# Patient Record
Sex: Male | Born: 1965 | Race: Black or African American | Hispanic: No | Marital: Single | Smoking: Current every day smoker
Health system: Southern US, Community
[De-identification: ages and names within clinical notes are randomized; demographics above are authoritative.]

## PROBLEM LIST (undated history)

## (undated) DIAGNOSIS — F32A Depression, unspecified: Secondary | ICD-10-CM

## (undated) DIAGNOSIS — I509 Heart failure, unspecified: Secondary | ICD-10-CM

## (undated) DIAGNOSIS — L8990 Pressure ulcer of unspecified site, unspecified stage: Secondary | ICD-10-CM

## (undated) DIAGNOSIS — Z87442 Personal history of urinary calculi: Secondary | ICD-10-CM

## (undated) DIAGNOSIS — F1010 Alcohol abuse, uncomplicated: Secondary | ICD-10-CM

## (undated) DIAGNOSIS — K219 Gastro-esophageal reflux disease without esophagitis: Secondary | ICD-10-CM

## (undated) DIAGNOSIS — Z9359 Other cystostomy status: Secondary | ICD-10-CM

## (undated) DIAGNOSIS — S14109A Unspecified injury at unspecified level of cervical spinal cord, initial encounter: Secondary | ICD-10-CM

## (undated) DIAGNOSIS — S82899A Other fracture of unspecified lower leg, initial encounter for closed fracture: Secondary | ICD-10-CM

## (undated) DIAGNOSIS — F329 Major depressive disorder, single episode, unspecified: Secondary | ICD-10-CM

## (undated) DIAGNOSIS — A4159 Other Gram-negative sepsis: Secondary | ICD-10-CM

## (undated) DIAGNOSIS — G904 Autonomic dysreflexia: Secondary | ICD-10-CM

## (undated) DIAGNOSIS — F438 Other reactions to severe stress: Secondary | ICD-10-CM

## (undated) DIAGNOSIS — S72009A Fracture of unspecified part of neck of unspecified femur, initial encounter for closed fracture: Secondary | ICD-10-CM

## (undated) DIAGNOSIS — S82891A Other fracture of right lower leg, initial encounter for closed fracture: Secondary | ICD-10-CM

## (undated) DIAGNOSIS — J9600 Acute respiratory failure, unspecified whether with hypoxia or hypercapnia: Secondary | ICD-10-CM

## (undated) DIAGNOSIS — Z720 Tobacco use: Secondary | ICD-10-CM

## (undated) DIAGNOSIS — M1990 Unspecified osteoarthritis, unspecified site: Secondary | ICD-10-CM

## (undated) DIAGNOSIS — R410 Disorientation, unspecified: Secondary | ICD-10-CM

## (undated) DIAGNOSIS — G9341 Metabolic encephalopathy: Secondary | ICD-10-CM

## (undated) DIAGNOSIS — I5030 Unspecified diastolic (congestive) heart failure: Secondary | ICD-10-CM

## (undated) DIAGNOSIS — G8250 Quadriplegia, unspecified: Secondary | ICD-10-CM

## (undated) DIAGNOSIS — R569 Unspecified convulsions: Secondary | ICD-10-CM

## (undated) HISTORY — PX: KNEE SURGERY: SHX244

---

## 1999-03-31 ENCOUNTER — Emergency Department (HOSPITAL_COMMUNITY): Admission: EM | Admit: 1999-03-31 | Discharge: 1999-03-31 | Payer: Self-pay | Admitting: Emergency Medicine

## 2007-04-07 ENCOUNTER — Emergency Department (HOSPITAL_COMMUNITY): Admission: EM | Admit: 2007-04-07 | Discharge: 2007-04-07 | Payer: Self-pay | Admitting: Emergency Medicine

## 2008-04-15 ENCOUNTER — Inpatient Hospital Stay (HOSPITAL_COMMUNITY): Admission: EM | Admit: 2008-04-15 | Discharge: 2008-04-22 | Payer: Self-pay | Admitting: Emergency Medicine

## 2008-04-15 ENCOUNTER — Ambulatory Visit: Payer: Self-pay | Admitting: Pulmonary Disease

## 2008-06-04 ENCOUNTER — Emergency Department (HOSPITAL_COMMUNITY): Admission: EM | Admit: 2008-06-04 | Discharge: 2008-06-05 | Payer: Self-pay | Admitting: Emergency Medicine

## 2008-09-10 ENCOUNTER — Ambulatory Visit: Payer: Self-pay | Admitting: Internal Medicine

## 2008-09-27 ENCOUNTER — Ambulatory Visit (HOSPITAL_COMMUNITY): Admission: RE | Admit: 2008-09-27 | Discharge: 2008-09-27 | Payer: Self-pay | Admitting: Family Medicine

## 2008-10-04 ENCOUNTER — Ambulatory Visit: Payer: Self-pay | Admitting: *Deleted

## 2009-07-15 ENCOUNTER — Emergency Department (HOSPITAL_COMMUNITY): Admission: EM | Admit: 2009-07-15 | Discharge: 2009-07-16 | Payer: Self-pay | Admitting: Emergency Medicine

## 2009-07-19 ENCOUNTER — Emergency Department (HOSPITAL_COMMUNITY): Admission: EM | Admit: 2009-07-19 | Discharge: 2009-07-19 | Payer: Self-pay | Admitting: Emergency Medicine

## 2010-04-16 ENCOUNTER — Emergency Department (HOSPITAL_COMMUNITY): Admission: EM | Admit: 2010-04-16 | Discharge: 2010-04-16 | Payer: Self-pay | Admitting: Emergency Medicine

## 2011-01-01 ENCOUNTER — Encounter: Payer: Self-pay | Admitting: Orthopedic Surgery

## 2011-04-24 NOTE — Discharge Summary (Signed)
NAME:  Phillip [name], Phillip [name] NO.:  [id number]   MEDICAL RECORD NO.:  [id number]          PATIENT TYPE:  INP   LOCATION:  1309                         FACILITY:  [facility]   PHYSICIAN:  Herbie Saxon, MDDATE OF BIRTH:  20-Mar-1966   DATE OF ADMISSION:  04/15/2008  DATE OF DISCHARGE:                               DISCHARGE SUMMARY   DISCHARGE DIAGNOSES:  1. Acute severe pancreatitis.  2. Acute hepatitis.  3. Alcohol abuse.  4. Tobacco abuse.  5. Hypertriglyceridemia.  6. Bilateral pleural effusion left greater than right.  7. Left lower lobe pneumonia.  8. Hypokalemia, on supplementation.  9. Microcytic anemia.  10.Thrombocytopenia.  11.Hypomagnesemia  12.Hypophosphatemia, was repleted  13.Sinus tachycardia.   CONSULTING PHYSICIAN:  Dr. Laurell Josephs   RADIOLOGY:  Chest x-ray 04/18/08 shows multiple right, left effusion,  bibasilar opacities noted slightly on the left.  CT of abdomen 04/15/08 shows diffuse inflammatory changes of the  pancreas compatible with acute pancreatitis.  No focal acidosis.  Diffuse fatty infiltration of the liver.  Small left pleural effusion,  minimal bibasilar atelectasis, atherosclerosis, a great amount of free  fluid tracking in the pelvis.  Chest x-ray 04/17/08 shows worsening effusion of atelectasis greater on  the left.   HOSPITAL COURSE:  Phillip [name] is a 45 year old African American male  given IV  and transferred to continuous IV on 04/18/08  With the  complaint of intermittent abdominal pain of one month duration  associated with nausea, vomiting, diarrhea  who presented to the  emergency room with hypotension, started on IV Unasyn, IV fluid  hydration and analgesic. Triglyceride was over 5000.  The patient was  kept NPO on conservative measures including sliding scale coverage,  hypoglycemia.  Hepatitis is resolving.  Hypokalemia which has been  repleted.  He has been started for Lopid for hypertriglyceridemia.  The  hypomagnesemia and hypophosphatemia have been repleted.  The patient was  started on Rocephin and Ditropan 04/19/08.  Noted to be having diarrhea,  which is probably the lactulose he was placed on because of his elevated  ammonia level.  The lactulose since has been reduced and lactulose  discontinued today.  C. diff tox is negative and diarrhea resolved. Marland Kitchen4  units of packed red blood cells transfused today with IV Lasix.  Patient  has alcohol withdrawal.   PHYSICAL EXAMINATION:  Ill man not in acute distress.  Temperature 98,  pulse 109, respiratory rate 22, blood pressure 143/94.  GENERAL:  Pale, not jaundiced.  No cyanosis or clubbing.  HEENT:  Normocephalic, atraumatic.  Mucous membranes moist.  Oropharynx  and nasopharynx are clear.  NECK:  Supple, negative JVD, no thyromegaly, lymphadenopathy.  CARDIOVASCULAR:  S1, S2, tachycardic, regular rate and rhythm, no  murmurs.  CHEST:  Clear, has reduced breath sounds at the bases, especially on the  left.  ABDOMEN:  Benign.  NEUROLOGIC:  Oriented to time, place and person.  Power is 5. Peripheral  pulses present.  Cranial nerves II-XII intact. Deep tendon reflexes 2  globally.  Babinski downgoing bilaterally.   Glucose 120, potassium 3.3, ammonia 44, lipase 51.  WBCs 8, hematocrit  23, platelet count 69, MCV 103, AST 58, ALT 55.   Followup is posttransfusion PBC,  repeat lipase, ammonia and  transaminases in the morning.   Medications will be determined at time of discharge.      Herbie Saxon, MD  Electronically Signed     MIO/MEDQ  D:  04/20/2008  T:  04/20/2008  Job:  [id number]

## 2011-04-24 NOTE — H&P (Signed)
NAME:  DORAN, NESTLE NO.:  [id number]   MEDICAL RECORD NO.:  [id number]          PATIENT TYPE:  INP   LOCATION:  1232                         FACILITY:  [facility]   PHYSICIAN:  Oley Balm. Sung Amabile, MD   DATE OF BIRTH:  1966/08/06   DATE OF ADMISSION:  04/15/2008  DATE OF DISCHARGE:                              HISTORY & PHYSICAL   ADMISSION DIAGNOSIS:  Severe pancreatitis.   HISTORY OF PRESENT ILLNESS:  Phillip [name] is a 45 year old gentleman who  presented to the [facility] Emergency Department on the day of  admission with 1 month of intermittent abdominal pain associated with  nausea, vomiting, and diarrhea.  For the 12 hours prior to his  presentation, that abdominal pain became severe on and prompted him to  go to the emergency department for evaluation.  Upon arrival, he was  noted to be hypotensive, which subsequently resolved with intravenous  fluids.  Because of the hypertension, critical care medicine service was  called to admit him.  A CT scan of the abdomen was performed for  evaluation and demonstrated changes of severe pancreatitis.  Upon my  initial evaluation, his blood pressure was normal, and he appeared to be  in no acute distress.  He is alert and awake.  He is well oriented.  He  describes drinking 4-5 drinks of vodka per day.  He has never had a  history of alcohol withdrawal complications.   PAST MEDICAL HISTORY:  Otherwise unremarkable.  He has had left knee  surgery related to a football injury approximately 20 years ago.   SOCIAL HISTORY:  He smokes approximately 1/2 pack to 1 pack of  cigarettes per day.  Drinks heavily, as described above.  He works as a  Investment banker, operational.  He has no history of abdominal exposures of note, and no recent  travel history of note.   FAMILY HISTORY:  Notable for diabetes only.   REVIEW OF SYSTEMS:  As per the history present illness.  In addition, he  denies fevers, chills, and sweats.  He has had no chest pain.  He  denies  dysuria.  Denies lower extremity edema and calf tenderness.   PHYSICAL EXAMINATION:  VITAL SIGNS:  Afebrile, with normal vital signs,  except heart rate of approximately 120.  GENERAL:  He is in no apparent distress.  HEENT: Reveals no acute abnormalities.  NECK:  Supple, without adenopathy or jugular venous distention.  CHEST:  Reveals a normal percussion note throughout.  Breath sounds are  full, without wheezes or other adventitious sounds.  CARDIAC:  Reveals a regular rate and rhythm, with no murmurs.  ABDOMEN:  Soft, nontender, with normal bowel sounds, but diffusely  tender - mildly to moderately.  EXTREMITIES: Without clubbing, cyanosis and edema.  NEUROLOGIC:  Reveals no focal deficits.   DATA:  Admission laboratory data has been reviewed.  Of note, the  initial specimen was described as lipemic.  His chemistries are notable  only for a calcium of 5.6.  Protein level is low at 4.9, and albumin  2.3.  Liver function tests are elevated, with an SGOT of  772, and an  SGPT of 225.  Total bilirubin 3.1.  Amylase 247.  Lipase could not be  obtained due to the lipemic nature of the specimen.  Total triglycerides  elevated at 5076.  CT scan of the abdomen demonstrates changes of severe  pancreatitis.  No chest x-ray has been performed.   IMPRESSION:  Severe pancreatitis - appears to be a combination of  alcoholic and hypertriglyceridemic. Despite the appearance on CT scan  and despite the fact that he presented with mild hypotension, he looks  remarkably well and does not appear to require central venous access.  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  His calcium level was markedly low as a result of  the pancreatitis.   PLAN/RECOMMENDATIONS:  He will be admitted to the step-down unit for  overnight observation.  Empiric antibiotics (Unasyn) have been  initiated.  He will remain n.p.o. and supported with intravenous fluids.   We will need to consider treatment for severe hypertriglyceridemia, as  this was certainly a major contributor, I suspect.  Sliding-scale  insulin has been ordered.  DVT prophylaxis has been ordered with  Lovenox.  Stress ulcer prophylaxis has been ordered, Protonix.  Analgesia will be provided by patient-controlled pump using Dilaudid.      Oley Balm Sung Amabile, MD  Electronically Signed     DBS/MEDQ  D:  04/16/2008  T:  04/16/2008  Job:  [id number]

## 2011-04-24 NOTE — Discharge Summary (Signed)
NAME:  Phillip [name], Phillip [name] NO.:  [id number]   MEDICAL RECORD NO.:  [id number]          PATIENT TYPE:  INP   LOCATION:  1309                         [facility]ACILITY:  [facility]   PHYSICIAN:  Michelene Gardener, MD    DATE OF BIRTH:  06-30-1966   DATE OF ADMISSION:  04/15/2008  DATE OF DISCHARGE:  04/22/2008                               DISCHARGE SUMMARY   ADDENDUM   DISCHARGE DIAGNOSES:  1. Acute severe pancreatitis.  2. Acute hepatitis.  3. Alcohol use.  4. Tobacco abuse.  5. Hypertriglyceridemia.  6. Bilateral pleural effusion, left greater than right.  7. Left lower lobe pneumonia.  8. Hypokalemia that resolved.  9. Microcytic anemia.  10.Thrombocytopenia.  11.Hypomagnesemia.  12.Hypoxemia.  13.Sinus tachycardia.   DISCHARGE MEDICATIONS:  1. Avelox 400 mg p.o. once daily times 5 days.  2. Flagyl 500 mg p.o. q.8h. times 5 days.  3. Vicodin 2 tablets q.6h as needed.  4. Lopid 600 mg p.o. twice daily.   COURSE OF HOSPITALIZATION:  Since the dictated discharge summary the  patient remained stable with no major change in his condition.  The  patient was already started on diet and then his diet was advanced and  he tolerated it very well.  At the time of discharge he was able to take  a regular diet with minimal pain.  Pain medications were discontinued  and at the time of discharge he had not taken any pain medicine for at  least 10 hours.  Lipase was back to normal.  Ammonia was back to normal.  Last x-ray done on him was on May 10, showed small to moderate left  effusion with bilateral opacities that was improved since last x-ray.  The patient remained afebrile.  Physical exam is benign.  He will be  sent home today on the above-mentioned medications.  Social service was  consulted to help with financial issues.  He was advised to follow with  health services.      Michelene Gardener, MD  Electronically Signed    NAE/MEDQ  D:  04/22/2008  T:  04/22/2008  Job:   [phone]

## 2011-09-06 LAB — CBC
MCHC: 33.4
MCV: 93.9
RBC: 4.03 — ABNORMAL LOW
RDW: 13.8
WBC: 8.4

## 2011-09-06 LAB — COMPREHENSIVE METABOLIC PANEL
ALT: 16
Alkaline Phosphatase: 55
BUN: 4 — ABNORMAL LOW
Chloride: 106
Creatinine, Ser: 0.82
GFR calc Af Amer: >60
GFR calc non Af Amer: >60
Total Bilirubin: 0.9

## 2011-09-06 LAB — URINALYSIS, ROUTINE W REFLEX MICROSCOPIC
Bilirubin Urine: NEGATIVE
Ketones, ur: NEGATIVE
Nitrite: NEGATIVE
Specific Gravity, Urine: 1.012
Urobilinogen, UA: 0.2

## 2011-09-06 LAB — DIFFERENTIAL
Basophils Absolute: 0.1
Lymphs Abs: 2.9
Monocytes Absolute: 0.6
Neutro Abs: 4.7

## 2011-09-06 LAB — LIPASE, BLOOD: Lipase: <10 — ABNORMAL LOW

## 2012-05-24 ENCOUNTER — Encounter (HOSPITAL_COMMUNITY): Payer: Self-pay | Admitting: Emergency Medicine

## 2012-05-24 ENCOUNTER — Emergency Department (INDEPENDENT_AMBULATORY_CARE_PROVIDER_SITE_OTHER): Payer: 59

## 2012-05-24 ENCOUNTER — Emergency Department (HOSPITAL_COMMUNITY)
Admission: EM | Admit: 2012-05-24 | Discharge: 2012-05-24 | Disposition: A | Discharge status: Home / Self Care | Payer: 59 | Source: Home / Self Care | Attending: Family Medicine | Admitting: Family Medicine

## 2012-05-24 DIAGNOSIS — IMO0002 Reserved for concepts with insufficient information to code with codable children: Secondary | ICD-10-CM

## 2012-05-24 DIAGNOSIS — M1710 Unilateral primary osteoarthritis, unspecified knee: Secondary | ICD-10-CM

## 2012-05-24 DIAGNOSIS — S83207A Unspecified tear of unspecified meniscus, current injury, left knee, initial encounter: Secondary | ICD-10-CM

## 2012-05-24 DIAGNOSIS — M1712 Unilateral primary osteoarthritis, left knee: Secondary | ICD-10-CM

## 2012-05-24 MED ORDER — DICLOFENAC POTASSIUM 50 MG PO TABS: 50.0000 mg | ORAL_TABLET | Freq: Three times a day (TID) | ORAL | Status: AC

## 2012-05-24 NOTE — Discharge Instructions (Signed)
Wear knee brace and use medicine as needed, see orthopedist if further problems.

## 2012-05-24 NOTE — ED Notes (Signed)
Tim, emt applying knee immobilizer

## 2012-05-24 NOTE — ED Notes (Signed)
C/o bilateral knee pain.  History of knee issues.  Patient reports over the past month knees have been getting worse: frequent swelling, frequent pain episodes.  No known injury one month ago, but does report walking dog yesterday and dog lunged, causing patient to twist left knee.  Reports pain in the back of both knees.

## 2012-05-24 NOTE — ED Provider Notes (Signed)
History     CSN: [id number]  Arrival date & time 05/24/12  1112   First MD Initiated Contact with Patient 05/24/12 1118      Chief Complaint  Patient presents with  . Knee Pain    (Consider location/radiation/quality/duration/timing/severity/associated sxs/prior treatment) Patient is a 46 y.o. male presenting with knee pain. The history is provided by the patient.  Knee Pain This is a recurrent problem. The current episode started more than 1 week ago (bilat knee pains for more than 1 mo.). The problem has been rapidly worsening (worse yest when twisted.). The symptoms are aggravated by twisting.    History reviewed. No pertinent past medical history.  Past Surgical History  Procedure Date  . Knee surgery     right knee surgery 1988    Family History  Problem Relation Age of Onset  . Hypertension Mother     History  Substance Use Topics  . Smoking status: Current Everyday Smoker  . Smokeless tobacco: Not on file  . Alcohol Use: Yes      Review of Systems  Constitutional: Negative.   Musculoskeletal: Positive for joint swelling and gait problem.    Allergies  Review of patient's allergies indicates no known allergies.  Home Medications   Current Outpatient Rx  Name Route Sig Dispense Refill  . IBUPROFEN 400 MG PO TABS Oral Take 400 mg by mouth every 6 (six) hours as needed.    Marland Kitchen DICLOFENAC POTASSIUM 50 MG PO TABS Oral Take 1 tablet (50 mg total) by mouth 3 (three) times daily. For knee pain 30 tablet 0    BP 159/101  Pulse 97  Temp 98.8 F (37.1 C) (Oral)  Resp 20  SpO2 100%  Physical Exam  Nursing note and vitals reviewed. Constitutional: He is oriented to person, place, and time. He appears well-developed and well-nourished.  Musculoskeletal: He exhibits tenderness.       Right knee: He exhibits bony tenderness. He exhibits no swelling, no effusion and no ecchymosis. tenderness found. Medial joint line and lateral joint line tenderness noted.     Left knee: He exhibits decreased range of motion, swelling, effusion and bony tenderness. He exhibits no ecchymosis and no erythema.  Neurological: He is alert and oriented to person, place, and time.  Skin: Skin is warm and dry.    ED Course  Procedures (including critical care time)  Labs Reviewed - No data to display Dg Knee Complete 4 Views Left  05/24/2012  *RADIOLOGY REPORT*  Clinical Data: And knee pain.  LEFT KNEE - COMPLETE 4+ VIEW  Comparison: No priors.  Findings: Four views of the left knee demonstrate no acute fracture, dislocation, joint or soft tissue abnormality.  There is however advanced osteoarthritis with profound joint space narrowing, subchondral sclerosis, subchondral cyst formation and osteophyte formation that is most severe in the medial compartment, and to a lesser extent in the patellofemoral compartment.  Mild subluxation of the distal femur relative to the tibia and is presumably secondary to advanced degenerative changes. Extensive atherosclerosis throughout the visualized vasculature.  IMPRESSION: 1.  Negative for acute fracture. 2.  Advanced degenerative changes of osteoarthritis, as detailed above, most severe in the medial compartment. 3.  Atherosclerosis.  Original Report Authenticated By: Florencia Reasons, M.D.     1. Degenerative joint disease of left knee   2. Tear of meniscus of left knee       MDM  X-rays reviewed and report per radiologist.  Linna Hoff, MD 05/24/12 1240

## 2012-05-24 NOTE — ED Notes (Signed)
Instructed to undress and place on gown for physician examination 

## 2012-09-15 ENCOUNTER — Ambulatory Visit (INDEPENDENT_AMBULATORY_CARE_PROVIDER_SITE_OTHER): Payer: 59 | Admitting: Sports Medicine

## 2012-09-15 VITALS — BP 130/86 | Ht 69.0 in | Wt 175.0 lb

## 2012-09-15 DIAGNOSIS — M25561 Pain in right knee: Secondary | ICD-10-CM

## 2012-09-15 DIAGNOSIS — M25569 Pain in unspecified knee: Secondary | ICD-10-CM

## 2012-09-15 DIAGNOSIS — M170 Bilateral primary osteoarthritis of knee: Secondary | ICD-10-CM

## 2012-09-15 DIAGNOSIS — M1710 Unilateral primary osteoarthritis, unspecified knee: Secondary | ICD-10-CM

## 2012-09-15 DIAGNOSIS — IMO0002 Reserved for concepts with insufficient information to code with codable children: Secondary | ICD-10-CM

## 2012-09-15 MED ORDER — MELOXICAM 15 MG PO TABS: 15.0000 mg | ORAL_TABLET | Freq: Every day | ORAL | Status: DC

## 2012-09-15 MED ORDER — TRAMADOL HCL 50 MG PO TABS: 50.0000 mg | ORAL_TABLET | Freq: Three times a day (TID) | ORAL | Status: DC | PRN

## 2012-09-15 NOTE — Progress Notes (Signed)
  Subjective:    Patient ID: Phillip [name], male    DOB: Jun 01, 1966, 46 y.o.   MRN: [id number]  HPI chief complaint: Bilateral knee pain  Pleasant 46 year old male comes in today complaining of long-standing bilateral knee pain. He injured his left knee playing football as a teenager. Had surgery to repair a "torn ligament". Denies any previous surgery to the right knee. He complains of diffuse bilateral knee pain worse with activity such as standing. Pain with squatting as well. Painful catching and popping as well as swelling. He feels like the knees want to give way. He was recently seen in the emergency room and x-rays of his left knee were obtained. He was placed on Voltaren but it has not been helpful. He has also tried Tylenol which is also not been helpful. He gets pain at rest particularly at night. No fevers or chills. His medical history is unremarkable but it should be noted that he has no primary care physician No known drug allergies Surgical history is as above Socially he smokes a pack of cigarettes a day, he drinks one alcoholic beverage a day, he works at the [employer] in [employer]    Review of Systems As above    Objective:   Physical Exam Well-developed, well-nourished. No acute distress. He appears older than his stated age  Right knee: Varus deformity is noted.  Range of motion from 0-100. 1+ intra-articular effusion. 1+ patellofemoral crepitus. The knee is stable to ligamentous exam. No joint line tenderness. Negative McMurray's. Neurovascularly intact distally  Left knee: Varus deformity is noted. Range of motion 0-100. 1+ intra-articular effusion. 1+ patellofemoral crepitus. Knee is stable to ligamentous exam including a negative Lachman and a negative drawer. No joint line tenderness. Negative McMurray's. Neurovascular intact distally  He walks with an obvious limp  X-rays of his left knee dated 05/24/2012 from [facility] are reviewed. They  are non-standing films. He has advanced osteoarthritis in the medial joint space with mild subluxation of the distal femur. Nothing acute. He has extensive atherosclerotic changes throughout the visualized vasculature.       Assessment & Plan:  1. Bilateral knee pain secondary to advanced medial compartmental DJD  I injected each of his knees with cortisone today. This was done using an anterior lateral approach. Prescription for Mobic 15 mg daily and Ultram 50 mg 3 times a day when necessary were provided. We will fit him with bilateral body helix knee sleeves and he will followup with me in 6 weeks. Definitive treatment is going to be a total knee arthroplasty but he is awfully young to consider that right now. If cortisone injections are ineffective, I would start with getting standing x-rays of each of his knees and potentially starting Visco supplementation.  Consent obtained and verified. Time-out conducted. Noted no overlying erythema, induration, or other signs of local infection. Skin prepped in a sterile fashion. Topical analgesic spray: Ethyl chloride. Joint: Left Knee Needle: 1.5 inch 25g Completed without difficulty. Meds: 3cc 0.5% marcaine, 1cc depomedrol  Consent obtained and verified. Time-out conducted. Noted no overlying erythema, induration, or other signs of local infection. Skin prepped in a sterile fashion. Topical analgesic spray: Ethyl chloride. Joint: Right Knee Needle: 1.5 inch 25g Completed without difficulty. Meds: 3 cc 0.5% marcaine, 1 cc depomedrol  Advised to call if fevers/chills, erythema, induration, drainage, or persistent bleeding.

## 2012-10-27 ENCOUNTER — Ambulatory Visit: Payer: 59 | Admitting: Sports Medicine

## 2012-11-03 ENCOUNTER — Ambulatory Visit: Payer: 59 | Admitting: Sports Medicine

## 2012-11-24 ENCOUNTER — Ambulatory Visit (INDEPENDENT_AMBULATORY_CARE_PROVIDER_SITE_OTHER): Payer: 59 | Admitting: Sports Medicine

## 2012-11-24 ENCOUNTER — Ambulatory Visit
Admission: RE | Admit: 2012-11-24 | Discharge: 2012-11-24 | Disposition: A | Discharge status: Home / Self Care | Payer: 59 | Source: Ambulatory Visit | Attending: Sports Medicine | Admitting: Sports Medicine

## 2012-11-24 ENCOUNTER — Encounter: Payer: Self-pay | Admitting: Sports Medicine

## 2012-11-24 VITALS — BP 108/73 | HR 103 | Ht 69.0 in | Wt 175.0 lb

## 2012-11-24 DIAGNOSIS — M1711 Unilateral primary osteoarthritis, right knee: Secondary | ICD-10-CM

## 2012-11-24 DIAGNOSIS — M25569 Pain in unspecified knee: Secondary | ICD-10-CM

## 2012-11-24 DIAGNOSIS — M1710 Unilateral primary osteoarthritis, unspecified knee: Secondary | ICD-10-CM

## 2012-11-24 DIAGNOSIS — M1712 Unilateral primary osteoarthritis, left knee: Secondary | ICD-10-CM

## 2012-11-24 NOTE — Progress Notes (Signed)
  Subjective:    Patient ID: Phillip [name], male    DOB: [birth year], 46 y.o.   MRN: [id number]  HPI  Pt is a 46 yo M presenting for follow up for bilateral knee pain. Patient was evaluated in October 2013 for bilateral pain and was given steroid injections at that time. He states the injections offered about one month of pain relief. He was also taking Mobic and Tramadol as needed, but has not taken these recently. Patient states he is not using compression sleeves (although this was in last office visit note, patient states he was not given these.) He states both knees continue to bother him. His left knee pain is anteromedial with swelling. Right knee pain is both medial and lateral, as well as a newer posterior knee pain.  Patient works in [employer] which requires extended periods of standing. He also walks his dog daily, up to 2 miles. Pain is worst with walking.   Review of Systems Negative, except as mentioned in HPI above    Objective:   Physical Exam  General: awake, alert, NAD Right knee: No gross deformity or redness. Trace effusion. ROM from 0-100 degrees with crepitus. TTP medial and lateral joint lines. TTP popliteal fossa with no swelling appreciated. No calf tenderness.  Left knee: Varus deformity. 1+ effusion with well healed scar. ROM from 0-90 degrees TTP medial joint line. No calf tenderness Neuro: Grossly intact    Assessment & Plan:   46 yo M with bilateral knee pain follow up. Most likely secondary to DJD of knees bilaterally  - Will get bilateral standing X-rays today (4 view- AP, lateral, 30 degree and sunrise) - Discussed surgical option, as well as Supartz possibility with patient. Will discuss more with him after X-rays - Continue Tramadol and Mobic as needed for pain (Refills at pharmacy for him to pick-up) - RTC as needed for re-evaluation. Could benefit from compression sleeves at next appointment.  Emmaline Wahba M. Arlester Keehan, M.D.

## 2012-11-28 ENCOUNTER — Telehealth: Payer: Self-pay | Admitting: Sports Medicine

## 2012-11-28 NOTE — Telephone Encounter (Signed)
Spoke with the patient today on the phone regarding x-rays of his knees. He has significant degenerative changes in each knee. He has bone-on-bone osteoarthritis in the medial compartment of the left knee and approaching bone-on-bone osteoarthritis in the medial compartment on the right. Definitive treatment is a total knee arthroplasty in each knee but he is relatively young. Cortisone injections provided him with about 3 weeks of pain relief. Nonsurgical treatment options are limited. I recommend the patient return to the office to get fitted with bilateral double upright knee braces. If his symptoms warrant, I would consider referral to Dr. Dion Saucier. He will followup when necessary.

## 2014-08-24 ENCOUNTER — Encounter (HOSPITAL_COMMUNITY): Payer: Self-pay | Admitting: Emergency Medicine

## 2014-08-24 ENCOUNTER — Emergency Department (HOSPITAL_COMMUNITY)
Admission: EM | Admit: 2014-08-24 | Discharge: 2014-08-24 | Disposition: A | Discharge status: Home / Self Care | Payer: Managed Care, Other (non HMO) | Attending: Emergency Medicine | Admitting: Emergency Medicine

## 2014-08-24 DIAGNOSIS — M25569 Pain in unspecified knee: Secondary | ICD-10-CM | POA: Insufficient documentation

## 2014-08-24 DIAGNOSIS — M25561 Pain in right knee: Secondary | ICD-10-CM

## 2014-08-24 DIAGNOSIS — M25562 Pain in left knee: Secondary | ICD-10-CM

## 2014-08-24 DIAGNOSIS — F17200 Nicotine dependence, unspecified, uncomplicated: Secondary | ICD-10-CM | POA: Diagnosis not present

## 2014-08-24 DIAGNOSIS — Z791 Long term (current) use of non-steroidal anti-inflammatories (NSAID): Secondary | ICD-10-CM | POA: Diagnosis not present

## 2014-08-24 DIAGNOSIS — Z79899 Other long term (current) drug therapy: Secondary | ICD-10-CM | POA: Insufficient documentation

## 2014-08-24 DIAGNOSIS — Z9889 Other specified postprocedural states: Secondary | ICD-10-CM | POA: Diagnosis not present

## 2014-08-24 MED ORDER — MELOXICAM 15 MG PO TABS: 15.0000 mg | ORAL_TABLET | Freq: Every day | ORAL | Status: DC

## 2014-08-24 MED ORDER — TRAMADOL HCL 50 MG PO TABS: 50.0000 mg | ORAL_TABLET | Freq: Four times a day (QID) | ORAL | Status: DC | PRN

## 2014-08-24 NOTE — ED Notes (Signed)
Pt to ED c/o bilateral knee pain x "years;" reports swelling and pain has been increasingly worse; swelling noted to bilateral knees, worse on right knee

## 2014-08-24 NOTE — ED Provider Notes (Signed)
CSN: [id number]     Arrival date & time 08/24/14  0920 History  This chart was scribed for non-physician practitioner Hyman Bible working with Ephraim Hamburger, MD by Donato Schultz, ED Scribe. This patient was seen in room TR07C/TR07C and the patient's care was started at 9:57 AM.     Chief Complaint  Patient presents with  . Knee Pain    HPI HPI Comments: Phillip [name] is a 48 y.o. male who presents to the Emergency Department complaining of constant bilateral knee pain that is worse on the right with associated swelling that started years ago. He denies any recent injury.  Moving his knees and walking aggravates the pain.  He denies fever, erythema, and chills as associated symptoms.  He states that he was seen by an orthopedist two years ago for his symptoms and had both knees drained.  He had xrays done 2 years ago, which showed advanced osteoarthritis of both knees.   He has not taken anything for pain.  History reviewed. No pertinent past medical history. Past Surgical History  Procedure Laterality Date  . Knee surgery      right knee surgery 1988   Family History  Problem Relation Age of Onset  . Hypertension Mother    History  Substance Use Topics  . Smoking status: Current Every Day Smoker -- 0.30 packs/day    Types: Cigarettes  . Smokeless tobacco: Never Used  . Alcohol Use: Yes    Review of Systems  Musculoskeletal: Positive for arthralgias and joint swelling.    Allergies  Review of patient's allergies indicates no known allergies.  Home Medications   Prior to Admission medications   Medication Sig Start Date End Date Taking? Authorizing Provider  ibuprofen (ADVIL,MOTRIN) 400 MG tablet Take 400 mg by mouth every 6 (six) hours as needed.    Historical Provider, MD  meloxicam (MOBIC) 15 MG tablet Take 1 tablet (15 mg total) by mouth daily. Take with food 09/15/12   Thurman Coyer, DO  traMADol (ULTRAM) 50 MG tablet Take 1 tablet (50 mg total) by mouth 3  (three) times daily as needed for pain. 09/15/12   Timothy R Draper, DO   BP 133/85  Pulse 87  Temp(Src) 98 F (36.7 C) (Oral)  Resp 16  Ht 5\' 9"  (1.753 m)  Wt 170 lb (77.111 kg)  BMI 25.09 kg/m2  SpO2 100%  Physical Exam  Nursing note and vitals reviewed. Constitutional: He is oriented to person, place, and time. He appears well-developed and well-nourished.  HENT:  Head: Normocephalic and atraumatic.  Eyes: EOM are normal.  Neck: Normal range of motion.  Cardiovascular: Normal rate, regular rhythm and normal heart sounds.   No murmur heard. Pulses:      Dorsalis pedis pulses are 2+ on the right side, and 2+ on the left side.  Pulmonary/Chest: Effort normal and breath sounds normal. No respiratory distress. He has no wheezes. He has no rales.  Musculoskeletal: Normal range of motion.  Full range of motion of both knees.  There is no erythema or warmth of knees bilaterally.  There is diffuse swelling of both knees particularly along the medial aspect.  Varus deformity of left knee.  Crepitus with movement of both knees.  Neurological: He is alert and oriented to person, place, and time.  Distal sensation of both feet is intact.  Skin: Skin is warm and dry. No erythema.  Psychiatric: He has a normal mood and affect. His behavior is normal.  ED Course  Procedures (including critical care time)  DIAGNOSTIC STUDIES: Oxygen Saturation is 100% on room air, normal by my interpretation.    COORDINATION OF CARE: 10:01 AM- Discussed a clinical suspicion of arthritis and applying an ACE wrap in the ED.  Will discharge patient with a referral to an orthopedist.  Advised the patient to take an antiinflammatory medication and ice the knee intermittently.  Advised the patient to return to the ED if he develops a fever or notices warmth in his knees.  The patient agreed to the treatment plan.   Labs Review Labs Reviewed - No data to display  Imaging Review No results found.   EKG  Interpretation None      MDM   Final diagnoses:  None   Patient presenting with bilateral knee pain.  He has a history of significant arthritis and has been seen by Orthopedics in the past.  No signs of infection at this time.  Feel that the patient is stable for discharge.  Patient discharged with Rx for Mobic and Ultram.  Return precautions given.     Hyman Bible, PA-C 08/24/14 [phone]

## 2014-08-28 NOTE — ED Provider Notes (Signed)
Medical screening examination/treatment/procedure(s) were performed by non-physician practitioner and as supervising physician I was immediately available for consultation/collaboration.  Ephraim Hamburger, MD 08/28/14 [phone]

## 2014-09-06 ENCOUNTER — Emergency Department (HOSPITAL_COMMUNITY)
Admission: EM | Admit: 2014-09-06 | Discharge: 2014-09-06 | Disposition: A | Discharge status: Home / Self Care | Payer: Managed Care, Other (non HMO) | Attending: Emergency Medicine | Admitting: Emergency Medicine

## 2014-09-06 ENCOUNTER — Encounter (HOSPITAL_COMMUNITY): Payer: Self-pay | Admitting: Emergency Medicine

## 2014-09-06 ENCOUNTER — Emergency Department (HOSPITAL_COMMUNITY): Payer: Managed Care, Other (non HMO)

## 2014-09-06 DIAGNOSIS — R569 Unspecified convulsions: Secondary | ICD-10-CM | POA: Diagnosis present

## 2014-09-06 DIAGNOSIS — Z791 Long term (current) use of non-steroidal anti-inflammatories (NSAID): Secondary | ICD-10-CM | POA: Insufficient documentation

## 2014-09-06 DIAGNOSIS — F17200 Nicotine dependence, unspecified, uncomplicated: Secondary | ICD-10-CM | POA: Insufficient documentation

## 2014-09-06 LAB — CBC WITH DIFFERENTIAL/PLATELET
BASOS PCT: 1 % (ref 0–1)
Basophils Absolute: 0 10*3/uL (ref 0.0–0.1)
Eosinophils Absolute: 0 10*3/uL (ref 0.0–0.7)
Eosinophils Relative: 1 % (ref 0–5)
HCT: 37.8 % — ABNORMAL LOW (ref 39.0–52.0)
Hemoglobin: 13.2 g/dL (ref 13.0–17.0)
Lymphocytes Relative: 23 % (ref 12–46)
Lymphs Abs: 0.7 10*3/uL (ref 0.7–4.0)
MCH: 32.2 pg (ref 26.0–34.0)
MCHC: 34.9 g/dL (ref 30.0–36.0)
MCV: 92.2 fL (ref 78.0–100.0)
MONOS PCT: 18 % — AB (ref 3–12)
Monocytes Absolute: 0.6 10*3/uL (ref 0.1–1.0)
NEUTROS PCT: 58 % (ref 43–77)
Neutro Abs: 1.8 10*3/uL (ref 1.7–7.7)
PLATELETS: 111 10*3/uL — AB (ref 150–400)
RBC: 4.1 MIL/uL — ABNORMAL LOW (ref 4.22–5.81)
RDW: 12.1 % (ref 11.5–15.5)
WBC: 3.2 10*3/uL — ABNORMAL LOW (ref 4.0–10.5)

## 2014-09-06 LAB — RAPID URINE DRUG SCREEN, HOSP PERFORMED
Amphetamines: NOT DETECTED
BARBITURATES: NOT DETECTED
Benzodiazepines: NOT DETECTED
COCAINE: NOT DETECTED
Opiates: NOT DETECTED
TETRAHYDROCANNABINOL: POSITIVE — AB

## 2014-09-06 LAB — BASIC METABOLIC PANEL
Anion gap: 18 — ABNORMAL HIGH (ref 5–15)
BUN: 8 mg/dL (ref 6–23)
CO2: 24 mEq/L (ref 19–32)
Calcium: 9.9 mg/dL (ref 8.4–10.5)
Chloride: 92 mEq/L — ABNORMAL LOW (ref 96–112)
Creatinine, Ser: 0.85 mg/dL (ref 0.50–1.35)
GFR calc Af Amer: >90 mL/min (ref >90)
GLUCOSE: 114 mg/dL — AB (ref 70–99)
POTASSIUM: 3.9 meq/L (ref 3.7–5.3)
SODIUM: 134 meq/L — AB (ref 137–147)

## 2014-09-06 NOTE — ED Provider Notes (Signed)
CSN: [id number]     Arrival date & time 09/06/14  1608 History   First MD Initiated Contact with Patient 09/06/14 1618     Chief Complaint  Patient presents with  . Seizures     (Consider location/radiation/quality/duration/timing/severity/associated sxs/prior Treatment) HPI 48 year old male presents by EMS after apparent witnessed new-onset generalized seizure just prior to arrival with postictal confusion and amnesia for the event, patient himself denies any complaints at all, he states he was working today at [employer] without difficulty he took the bus downtown and was waiting at the bus station for a friend to pick him up and that is the last thing he remembers. The next thing he knew the ambulance is bringing him to the hospital. He denies headache neck pain back pain chest pain shortness of breath palpitations abdominal pain change in speech or vision weakness numbness or other concerns. EMS reports the patient was picked up at the bus station by his friend and when the patient was seated in the back seat of a car the patient had a witnessed generalized seizure and post ictal confusion which is now improving but not quite resolved. Patient denies any recent alcohol or drug usage. There is no reported history of any trauma.  History reviewed. No pertinent past medical history. Past Surgical History  Procedure Laterality Date  . Knee surgery      right knee surgery 1988   Family History  Problem Relation Age of Onset  . Hypertension Mother    History  Substance Use Topics  . Smoking status: Current Every Day Smoker -- 0.30 packs/day    Types: Cigarettes  . Smokeless tobacco: Never Used  . Alcohol Use: Yes    Review of Systems 10 Systems reviewed and are negative for acute change except as noted in the HPI.   Allergies  Review of patient's allergies indicates no known allergies.  Home Medications   Prior to Admission medications   Medication Sig Start Date End Date  Taking? Authorizing Provider  ibuprofen (ADVIL,MOTRIN) 400 MG tablet Take 400 mg by mouth every 6 (six) hours as needed.   Yes Historical Provider, MD  meloxicam (MOBIC) 15 MG tablet Take 1 tablet (15 mg total) by mouth daily. 08/24/14  Yes Heather Laisure, PA-C  traMADol (ULTRAM) 50 MG tablet Take 1 tablet (50 mg total) by mouth every 6 (six) hours as needed. 08/24/14  Yes Heather Laisure, PA-C   BP 172/97  Pulse 70  Temp(Src) 98.2 F (36.8 C) (Oral)  Resp 15  Ht 5\' 9"  (1.753 m)  Wt 170 lb (77.111 kg)  BMI 25.09 kg/m2  SpO2 100% Physical Exam  Nursing note and vitals reviewed. Constitutional:  Awake, alert, nontoxic appearance with baseline speech for patient.  HENT:  Head: Atraumatic.  Mouth/Throat: No oropharyngeal exudate.  No tongue biting  Eyes: EOM are normal. Pupils are equal, round, and reactive to light. Right eye exhibits no discharge. Left eye exhibits no discharge.  Neck: Neck supple.  Cervical spine nontender  Cardiovascular: Normal rate and regular rhythm.   No murmur heard. Pulmonary/Chest: Effort normal and breath sounds normal. No stridor. No respiratory distress. He has no wheezes. He has no rales. He exhibits no tenderness.  Abdominal: Soft. Bowel sounds are normal. He exhibits no mass. There is no tenderness. There is no rebound.  Musculoskeletal: He exhibits no tenderness.  Baseline ROM, moves extremities with no obvious new focal weakness.  Lymphadenopathy:    He has no cervical adenopathy.  Neurological: He is  alert.  Awake, alert, cooperative and has amnesia for the events of this afternoon just prior to arrival he is currently oriented to person and place but not time; motor strength 5/5 bilaterally; sensation normal to light touch bilaterally; peripheral visual fields full to confrontation; no facial asymmetry; tongue midline; major cranial nerves appear intact; no pronator drift, normal finger to nose bilaterally  Skin: No rash noted.  Psychiatric: He has  a normal mood and affect.    ED Course  Procedures (including critical care time) Patient / Family / Caregiver informed of clinical course, understand medical decision-making process, and agree with plan.d/w Neuro recs no anti-seizure med at this time. F/u OutPt Neuro. Labs Review Labs Reviewed  CBC WITH DIFFERENTIAL - Abnormal; Notable for the following:    WBC 3.2 (*)    RBC 4.10 (*)    HCT 37.8 (*)    Platelets 111 (*)    Monocytes Relative 18 (*)    All other components within normal limits  BASIC METABOLIC PANEL - Abnormal; Notable for the following:    Sodium 134 (*)    Chloride 92 (*)    Glucose, Bld 114 (*)    Anion gap 18 (*)    All other components within normal limits  URINE RAPID DRUG SCREEN (HOSP PERFORMED) - Abnormal; Notable for the following:    Tetrahydrocannabinol POSITIVE (*)    All other components within normal limits    Imaging Review Ct Head Wo Contrast  09/06/2014   CLINICAL DATA:  Seizure like activity with subsequent confusion.  EXAM: CT HEAD WITHOUT CONTRAST  TECHNIQUE: Contiguous axial images were obtained from the base of the skull through the vertex without contrast.  COMPARISON:  None.  FINDINGS: Premature for age cerebral and cerebellar atrophy. No white matter hypoattenuation or asymmetry. No evidence for acute infarction, hemorrhage, mass lesion, hydrocephalus, or extra-axial fluid. Vascular calcification moderately advanced for the patient's age of 30 particularly in the LEFT vertebral. No osseous lesions. No sinus or mastoid disease.  IMPRESSION: Premature for age cerebral and cerebellar atrophy. Vascular calcification, also premature for age.  No acute intracranial findings. No intracranial mass lesion is evident.   Electronically Signed   By: Rolla Flatten M.D.   On: 09/06/2014 17:15     EKG Interpretation   Date/Time:  Monday September 06 2014 16:30:16 EDT Ventricular Rate:  112 PR Interval:  144 QRS Duration: 85 QT Interval:  371 QTC  Calculation: 506 R Axis:   -62 Text Interpretation:  Sinus tachycardia Left anterior fascicular block  Probable anteroseptal infarct, old Prolonged QT interval No previous ECGs  available Confirmed by [facility]  MD, Jenny Reichmann ([id number]) on 09/06/2014 4:45:44 PM      MDM   Final diagnoses:  Seizure    I doubt any other EMC precluding discharge at this time including, but not necessarily limited to the following:SAH, CVA, syncope, status epilepticus.    Babette Relic, MD 09/08/14 [phone]

## 2014-09-06 NOTE — ED Notes (Signed)
Pt was in car of friend after being at bus stop friend called medics dt pt having seizure like activity pt denies any hx per medics pt was confused on their arrival pt denies being in a car or with a friend he states   [place] pt has no complaints

## 2014-09-06 NOTE — ED Notes (Signed)
Pt to ct 

## 2014-09-06 NOTE — Discharge Instructions (Signed)
Seizure, Adult °A seizure is abnormal electrical activity in the brain. Seizures usually last from 30 seconds to 2 minutes. There are various types of seizures. °Before a seizure, you may have a warning sensation (aura) that a seizure is about to occur. An aura may include the following symptoms:  °· Fear or anxiety. °· Nausea. °· Feeling like the room is spinning (vertigo). °· Vision changes, such as seeing flashing lights or spots. °Common symptoms during a seizure include: °· A change in attention or behavior (altered mental status). °· Convulsions with rhythmic jerking movements. °· Drooling. °· Rapid eye movements. °· Grunting. °· Loss of bladder and bowel control. °· Bitter taste in the mouth. °· Tongue biting. °After a seizure, you may feel confused and sleepy. You may also have an injury resulting from convulsions during the seizure. °HOME CARE INSTRUCTIONS  °· If you are given medicines, take them exactly as prescribed by your health care provider. °· Keep all follow-up appointments as directed by your health care provider. °· Do not swim or drive or engage in risky activity during which a seizure could cause further injury to you or others until your health care provider says it is OK. °· Get adequate rest. °· Teach friends and family what to do if you have a seizure. They should: °· Lay you on the ground to prevent a fall. °· Put a cushion under your head. °· Loosen any tight clothing around your neck. °· Turn you on your side. If vomiting occurs, this helps keep your airway clear. °· Stay with you until you recover. °· Know whether or not you need emergency care. °SEEK IMMEDIATE MEDICAL CARE IF: °· The seizure lasts longer than 5 minutes. °· The seizure is severe or you do not wake up immediately after the seizure. °· You have an altered mental status after the seizure. °· You are having more frequent or worsening seizures. °Someone should drive you to the emergency department or call local emergency  services (911 in [place] °MAKE SURE YOU: °· Understand these instructions. °· Will watch your condition. °· Will get help right away if you are not doing well or get worse. °Document Released: 11/23/2000 Document Revised: 09/16/2013 Document Reviewed: 07/08/2013 °ExitCare® Patient Information ©2015 [employer]. This information is not intended to replace advice given to you by your health care provider. Make sure you discuss any questions you have with your health care provider. ° °Driving and Equipment Restrictions °Some medical problems make it dangerous to drive, ride a bike, or use machines. Some of these problems are: °· A hard blow to the head (concussion). °· Passing out (fainting). °· Twitching and shaking (seizures). °· Low blood sugar. °· Taking medicine to help you relax (sedatives). °· Taking pain medicines. °· Wearing an eye patch. °· Wearing splints. This can make it hard to use parts of your body that you need to drive safely. °HOME CARE  °· Do not drive until your doctor says it is okay. °· Do not use machines until your doctor says it is okay. °You may need a form signed by your doctor (medical release) before you can drive again. You may also need this form before you do other tasks where you need to be fully alert. °MAKE SURE YOU: °· Understand these instructions. °· Will watch your condition. °· Will get help right away if you are not doing well or get worse. °Document Released: 01/03/2005 Document Revised: 02/18/2012 Document Reviewed: 04/05/2010 °ExitCare® Patient Information ©2015 [employer].   This information is not intended to replace advice given to you by your health care provider. Make sure you discuss any questions you have with your health care provider. ° °

## 2014-10-09 ENCOUNTER — Emergency Department (HOSPITAL_COMMUNITY): Payer: Managed Care, Other (non HMO)

## 2014-10-09 ENCOUNTER — Other Ambulatory Visit: Payer: Self-pay

## 2014-10-09 ENCOUNTER — Encounter (HOSPITAL_COMMUNITY): Payer: Managed Care, Other (non HMO) | Admitting: Anesthesiology

## 2014-10-09 ENCOUNTER — Encounter (HOSPITAL_COMMUNITY): Payer: Self-pay | Admitting: Emergency Medicine

## 2014-10-09 ENCOUNTER — Inpatient Hospital Stay (HOSPITAL_COMMUNITY)
Admission: EM | Admit: 2014-10-09 | Discharge: 2014-10-12 | DRG: 482 | Disposition: A | Discharge status: Home / Self Care | Payer: Managed Care, Other (non HMO) | Attending: Orthopedic Surgery | Admitting: Orthopedic Surgery

## 2014-10-09 ENCOUNTER — Encounter (HOSPITAL_COMMUNITY)
Admission: EM | Disposition: A | Discharge status: Home / Self Care | Payer: Self-pay | Source: Home / Self Care | Attending: Orthopedic Surgery

## 2014-10-09 ENCOUNTER — Emergency Department (HOSPITAL_COMMUNITY): Payer: Managed Care, Other (non HMO) | Admitting: Anesthesiology

## 2014-10-09 DIAGNOSIS — R569 Unspecified convulsions: Secondary | ICD-10-CM

## 2014-10-09 DIAGNOSIS — F17210 Nicotine dependence, cigarettes, uncomplicated: Secondary | ICD-10-CM | POA: Diagnosis present

## 2014-10-09 DIAGNOSIS — W19XXXA Unspecified fall, initial encounter: Secondary | ICD-10-CM

## 2014-10-09 DIAGNOSIS — S72009A Fracture of unspecified part of neck of unspecified femur, initial encounter for closed fracture: Secondary | ICD-10-CM | POA: Diagnosis present

## 2014-10-09 DIAGNOSIS — Z419 Encounter for procedure for purposes other than remedying health state, unspecified: Secondary | ICD-10-CM

## 2014-10-09 DIAGNOSIS — M25461 Effusion, right knee: Secondary | ICD-10-CM | POA: Diagnosis present

## 2014-10-09 DIAGNOSIS — T1490XA Injury, unspecified, initial encounter: Secondary | ICD-10-CM

## 2014-10-09 DIAGNOSIS — R509 Fever, unspecified: Secondary | ICD-10-CM

## 2014-10-09 DIAGNOSIS — Z9889 Other specified postprocedural states: Secondary | ICD-10-CM

## 2014-10-09 DIAGNOSIS — W010XXA Fall on same level from slipping, tripping and stumbling without subsequent striking against object, initial encounter: Secondary | ICD-10-CM | POA: Diagnosis present

## 2014-10-09 DIAGNOSIS — W07XXXA Fall from chair, initial encounter: Secondary | ICD-10-CM

## 2014-10-09 DIAGNOSIS — S72141A Displaced intertrochanteric fracture of right femur, initial encounter for closed fracture: Principal | ICD-10-CM | POA: Diagnosis present

## 2014-10-09 DIAGNOSIS — Y93K1 Activity, walking an animal: Secondary | ICD-10-CM

## 2014-10-09 DIAGNOSIS — R739 Hyperglycemia, unspecified: Secondary | ICD-10-CM

## 2014-10-09 DIAGNOSIS — Z720 Tobacco use: Secondary | ICD-10-CM

## 2014-10-09 DIAGNOSIS — Z8781 Personal history of (healed) traumatic fracture: Secondary | ICD-10-CM

## 2014-10-09 DIAGNOSIS — F1020 Alcohol dependence, uncomplicated: Secondary | ICD-10-CM | POA: Diagnosis present

## 2014-10-09 DIAGNOSIS — IMO0001 Reserved for inherently not codable concepts without codable children: Secondary | ICD-10-CM | POA: Diagnosis present

## 2014-10-09 DIAGNOSIS — M179 Osteoarthritis of knee, unspecified: Secondary | ICD-10-CM | POA: Diagnosis present

## 2014-10-09 HISTORY — PX: INTRAMEDULLARY (IM) NAIL INTERTROCHANTERIC: SHX5875

## 2014-10-09 HISTORY — DX: Unspecified osteoarthritis, unspecified site: M19.90

## 2014-10-09 HISTORY — DX: Fracture of unspecified part of neck of unspecified femur, initial encounter for closed fracture: S72.009A

## 2014-10-09 LAB — COMPREHENSIVE METABOLIC PANEL
ALBUMIN: 4.3 g/dL (ref 3.5–5.2)
ALK PHOS: 95 U/L (ref 39–117)
ALT: 37 U/L (ref 0–53)
AST: 41 U/L — AB (ref 0–37)
Anion gap: 17 — ABNORMAL HIGH (ref 5–15)
BILIRUBIN TOTAL: 0.7 mg/dL (ref 0.3–1.2)
BUN: 4 mg/dL — ABNORMAL LOW (ref 6–23)
CHLORIDE: 98 meq/L (ref 96–112)
CO2: 24 meq/L (ref 19–32)
Calcium: 9.6 mg/dL (ref 8.4–10.5)
Creatinine, Ser: 0.61 mg/dL (ref 0.50–1.35)
GFR calc Af Amer: >90 mL/min (ref >90)
Glucose, Bld: 92 mg/dL (ref 70–99)
POTASSIUM: 3.7 meq/L (ref 3.7–5.3)
Sodium: 139 mEq/L (ref 137–147)
Total Protein: 8.9 g/dL — ABNORMAL HIGH (ref 6.0–8.3)

## 2014-10-09 LAB — CBC WITH DIFFERENTIAL/PLATELET
Basophils Absolute: 0 10*3/uL (ref 0.0–0.1)
Basophils Relative: 0 % (ref 0–1)
Eosinophils Absolute: 0 10*3/uL (ref 0.0–0.7)
Eosinophils Relative: 0 % (ref 0–5)
HEMATOCRIT: 42.9 % (ref 39.0–52.0)
Hemoglobin: 14.8 g/dL (ref 13.0–17.0)
LYMPHS ABS: 1.5 10*3/uL (ref 0.7–4.0)
LYMPHS PCT: 23 % (ref 12–46)
MCH: 32.8 pg (ref 26.0–34.0)
MCHC: 34.5 g/dL (ref 30.0–36.0)
MCV: 95.1 fL (ref 78.0–100.0)
MONO ABS: 0.8 10*3/uL (ref 0.1–1.0)
Monocytes Relative: 12 % (ref 3–12)
NEUTROS ABS: 4.2 10*3/uL (ref 1.7–7.7)
Neutrophils Relative %: 65 % (ref 43–77)
Platelets: 299 10*3/uL (ref 150–400)
RBC: 4.51 MIL/uL (ref 4.22–5.81)
RDW: 12.7 % (ref 11.5–15.5)
WBC: 6.5 10*3/uL (ref 4.0–10.5)

## 2014-10-09 LAB — RAPID URINE DRUG SCREEN, HOSP PERFORMED
AMPHETAMINES: NOT DETECTED
BENZODIAZEPINES: NOT DETECTED
Barbiturates: NOT DETECTED
Cocaine: NOT DETECTED
Opiates: POSITIVE — AB
Tetrahydrocannabinol: POSITIVE — AB

## 2014-10-09 LAB — URINALYSIS, ROUTINE W REFLEX MICROSCOPIC
BILIRUBIN URINE: NEGATIVE
GLUCOSE, UA: NEGATIVE mg/dL
HGB URINE DIPSTICK: NEGATIVE
KETONES UR: NEGATIVE mg/dL
LEUKOCYTES UA: NEGATIVE
Nitrite: NEGATIVE
PH: 5.5 (ref 5.0–8.0)
PROTEIN: NEGATIVE mg/dL
Specific Gravity, Urine: 1.017 (ref 1.005–1.030)
Urobilinogen, UA: 1 mg/dL (ref 0.0–1.0)

## 2014-10-09 LAB — TYPE AND SCREEN
ABO/RH(D): O POS
Antibody Screen: NEGATIVE

## 2014-10-09 SURGERY — FIXATION, FRACTURE, INTERTROCHANTERIC, WITH INTRAMEDULLARY ROD
Anesthesia: General | Laterality: Right

## 2014-10-09 MED ORDER — HYDROMORPHONE HCL 1 MG/ML IJ SOLN
0.2500 mg | INTRAMUSCULAR | Status: DC | PRN
  Administered 2014-10-10 (×3): 0.5 mg via INTRAVENOUS

## 2014-10-09 MED ORDER — HYDROMORPHONE HCL 1 MG/ML IJ SOLN
1.0000 mg | Freq: Once | INTRAMUSCULAR | Status: AC
  Administered 2014-10-09: 1 mg via INTRAVENOUS
  Filled 2014-10-09: qty 1

## 2014-10-09 MED ORDER — METHOCARBAMOL 500 MG PO TABS
500.0000 mg | ORAL_TABLET | Freq: Four times a day (QID) | ORAL | Status: DC | PRN
  Administered 2014-10-10 – 2014-10-11 (×3): 500 mg via ORAL
  Filled 2014-10-09 (×3): qty 1

## 2014-10-09 MED ORDER — CEFAZOLIN SODIUM-DEXTROSE 2-3 GM-% IV SOLR
2.0000 g | Freq: Three times a day (TID) | INTRAVENOUS | Status: AC
  Administered 2014-10-09: 2 g via INTRAVENOUS
  Filled 2014-10-09 (×2): qty 50

## 2014-10-09 MED ORDER — SUCCINYLCHOLINE CHLORIDE 20 MG/ML IJ SOLN
INTRAMUSCULAR | Status: DC | PRN
  Administered 2014-10-09: 120 mg via INTRAVENOUS

## 2014-10-09 MED ORDER — ONDANSETRON HCL 4 MG/2ML IJ SOLN
INTRAMUSCULAR | Status: DC | PRN
  Administered 2014-10-09 (×2): 2 mg via INTRAVENOUS

## 2014-10-09 MED ORDER — LACTATED RINGERS IV SOLN
INTRAVENOUS | Status: DC | PRN
  Administered 2014-10-09 (×2): via INTRAVENOUS

## 2014-10-09 MED ORDER — BUPIVACAINE HCL (PF) 0.25 % IJ SOLN
INTRAMUSCULAR | Status: DC | PRN
  Administered 2014-10-09: 30 mL

## 2014-10-09 MED ORDER — BUPIVACAINE HCL (PF) 0.25 % IJ SOLN
INTRAMUSCULAR | Status: AC
  Filled 2014-10-09: qty 30

## 2014-10-09 MED ORDER — LIDOCAINE HCL (CARDIAC) 20 MG/ML IV SOLN
INTRAVENOUS | Status: DC | PRN
  Administered 2014-10-09: 75 mg via INTRAVENOUS

## 2014-10-09 MED ORDER — FENTANYL CITRATE 0.05 MG/ML IJ SOLN
INTRAMUSCULAR | Status: AC
  Filled 2014-10-09: qty 5

## 2014-10-09 MED ORDER — ACETAMINOPHEN 10 MG/ML IV SOLN
1000.0000 mg | Freq: Once | INTRAVENOUS | Status: DC
  Filled 2014-10-09 (×2): qty 100

## 2014-10-09 MED ORDER — LACTATED RINGERS IV SOLN: INTRAVENOUS | Status: DC

## 2014-10-09 MED ORDER — MIDAZOLAM HCL 2 MG/2ML IJ SOLN
INTRAMUSCULAR | Status: AC
  Filled 2014-10-09: qty 2

## 2014-10-09 MED ORDER — MIDAZOLAM HCL 5 MG/5ML IJ SOLN
INTRAMUSCULAR | Status: DC | PRN
  Administered 2014-10-09 (×2): 0.5 mg via INTRAVENOUS

## 2014-10-09 MED ORDER — LIDOCAINE HCL (CARDIAC) 20 MG/ML IV SOLN
INTRAVENOUS | Status: AC
  Filled 2014-10-09: qty 5

## 2014-10-09 MED ORDER — PROPOFOL 10 MG/ML IV BOLUS
INTRAVENOUS | Status: DC | PRN
  Administered 2014-10-09: 175 mg via INTRAVENOUS

## 2014-10-09 MED ORDER — 0.9 % SODIUM CHLORIDE (POUR BTL) OPTIME
TOPICAL | Status: DC | PRN
  Administered 2014-10-09: 1000 mL

## 2014-10-09 MED ORDER — METOCLOPRAMIDE HCL 5 MG/ML IJ SOLN
INTRAMUSCULAR | Status: AC
  Filled 2014-10-09: qty 2

## 2014-10-09 MED ORDER — DEXAMETHASONE SODIUM PHOSPHATE 10 MG/ML IJ SOLN
INTRAMUSCULAR | Status: AC
  Filled 2014-10-09: qty 1

## 2014-10-09 MED ORDER — LORAZEPAM 1 MG PO TABS: 0.0000 mg | ORAL_TABLET | Freq: Four times a day (QID) | ORAL | Status: DC

## 2014-10-09 MED ORDER — FENTANYL CITRATE 0.05 MG/ML IJ SOLN
INTRAMUSCULAR | Status: DC | PRN
  Administered 2014-10-09 (×2): 50 ug via INTRAVENOUS

## 2014-10-09 MED ORDER — CISATRACURIUM BESYLATE (PF) 10 MG/5ML IV SOLN
INTRAVENOUS | Status: DC | PRN
  Administered 2014-10-09: 8 mg via INTRAVENOUS

## 2014-10-09 MED ORDER — METHOCARBAMOL 1000 MG/10ML IJ SOLN
500.0000 mg | Freq: Four times a day (QID) | INTRAVENOUS | Status: DC | PRN
  Filled 2014-10-09: qty 5

## 2014-10-09 MED ORDER — ONDANSETRON HCL 4 MG/2ML IJ SOLN
INTRAMUSCULAR | Status: AC
  Filled 2014-10-09: qty 2

## 2014-10-09 MED ORDER — DEXAMETHASONE SODIUM PHOSPHATE 10 MG/ML IJ SOLN
INTRAMUSCULAR | Status: DC | PRN
  Administered 2014-10-09: 10 mg via INTRAVENOUS

## 2014-10-09 MED ORDER — THIAMINE HCL 100 MG/ML IJ SOLN: 100.0000 mg | Freq: Once | INTRAMUSCULAR | Status: DC

## 2014-10-09 MED ORDER — LORAZEPAM 1 MG PO TABS: 0.0000 mg | ORAL_TABLET | Freq: Two times a day (BID) | ORAL | Status: DC

## 2014-10-09 MED ORDER — CISATRACURIUM BESYLATE 20 MG/10ML IV SOLN
INTRAVENOUS | Status: AC
  Filled 2014-10-09: qty 10

## 2014-10-09 MED ORDER — GLYCOPYRROLATE 0.2 MG/ML IJ SOLN
INTRAMUSCULAR | Status: DC | PRN
  Administered 2014-10-09: .3 mg via INTRAVENOUS

## 2014-10-09 MED ORDER — PROPOFOL 10 MG/ML IV BOLUS
INTRAVENOUS | Status: AC
  Filled 2014-10-09: qty 20

## 2014-10-09 MED ORDER — NEOSTIGMINE METHYLSULFATE 10 MG/10ML IV SOLN
INTRAVENOUS | Status: DC | PRN
  Administered 2014-10-09: 3 mg via INTRAVENOUS

## 2014-10-09 MED ORDER — MORPHINE SULFATE 4 MG/ML IJ SOLN
4.0000 mg | Freq: Once | INTRAMUSCULAR | Status: AC
  Administered 2014-10-09: 4 mg via INTRAVENOUS
  Filled 2014-10-09: qty 1

## 2014-10-09 SURGICAL SUPPLY — 45 items
BAG SPEC THK2 15X12 ZIP CLS (MISCELLANEOUS) ×1
BAG ZIPLOCK 12X15 (MISCELLANEOUS) ×3 IMPLANT
BANDAGE ELASTIC 6 VELCRO ST LF (GAUZE/BANDAGES/DRESSINGS) ×2 IMPLANT
BIT DRILL 4.3MMS DISTAL GRDTED (BIT) IMPLANT
COVER MAYO STAND STRL (DRAPES) ×3 IMPLANT
COVER SURGICAL LIGHT HANDLE (MISCELLANEOUS) ×3 IMPLANT
DRAPE BACK TABLE (DRAPES) ×3 IMPLANT
DRAPE STERI IOBAN 125X83 (DRAPES) ×3 IMPLANT
DRILL 4.3MMS DISTAL GRADUATED (BIT) ×3
DRSG AQUACEL AG ADV 3.5X 4 (GAUZE/BANDAGES/DRESSINGS) ×6 IMPLANT
DRSG MEPILEX BORDER 4X4 (GAUZE/BANDAGES/DRESSINGS) ×2 IMPLANT
DRSG PAD ABDOMINAL 8X10 ST (GAUZE/BANDAGES/DRESSINGS) ×12 IMPLANT
DURAPREP 26ML APPLICATOR (WOUND CARE) ×3 IMPLANT
ELECT REM PT RETURN 9FT ADLT (ELECTROSURGICAL) ×3
ELECTRODE REM PT RTRN 9FT ADLT (ELECTROSURGICAL) ×1 IMPLANT
GAUZE SPONGE 4X4 12PLY STRL (GAUZE/BANDAGES/DRESSINGS) ×3 IMPLANT
GAUZE SPONGE 4X4 16PLY XRAY LF (GAUZE/BANDAGES/DRESSINGS) ×2 IMPLANT
GLOVE BIOGEL PI IND STRL 8.5 (GLOVE) ×1 IMPLANT
GLOVE BIOGEL PI INDICATOR 8.5 (GLOVE) ×2
GLOVE ECLIPSE 8.5 STRL (GLOVE) ×3 IMPLANT
GOWN STRL REUS W/TWL 2XL LVL3 (GOWN DISPOSABLE) ×3 IMPLANT
GOWN STRL REUS W/TWL XL LVL3 (GOWN DISPOSABLE) ×3 IMPLANT
GUIDEPIN 3.2X17.5 THRD DISP (PIN) ×4 IMPLANT
GUIDEWIRE BALL NOSE 100CM (WIRE) ×2 IMPLANT
KIT BASIN OR (CUSTOM PROCEDURE TRAY) ×3 IMPLANT
MANIFOLD NEPTUNE II (INSTRUMENTS) ×3 IMPLANT
NAIL HIP FRAC RT 130 11MX400M (Nail) ×2 IMPLANT
NDL SAFETY ECLIPSE 18X1.5 (NEEDLE) IMPLANT
NEEDLE HYPO 18GX1.5 SHARP (NEEDLE) ×3
NEEDLE HYPO 22GX1.5 SAFETY (NEEDLE) ×3 IMPLANT
PACK GENERAL/GYN (CUSTOM PROCEDURE TRAY) ×3 IMPLANT
PAD CAST 4YDX4 CTTN HI CHSV (CAST SUPPLIES) ×1 IMPLANT
PADDING CAST COTTON 4X4 STRL (CAST SUPPLIES) ×3
PADDING CAST SYN 6 (CAST SUPPLIES) ×2
PADDING CAST SYNTHETIC 6X4 NS (CAST SUPPLIES) ×1 IMPLANT
POSITIONER SURGICAL ARM (MISCELLANEOUS) ×9 IMPLANT
SCREW BONE CORTICAL 5.0X40 (Screw) ×2 IMPLANT
SCREW LAG 10.5MMX105MM HFN (Screw) ×2 IMPLANT
STAPLER VISISTAT 35W (STAPLE) ×3 IMPLANT
SUT BONE WAX W31G (SUTURE) ×3 IMPLANT
SUT VIC AB 1 CT1 36 (SUTURE) ×8 IMPLANT
SUT VIC AB 2-0 CT1 18 (SUTURE) ×5 IMPLANT
SYR 50ML LL SCALE MARK (SYRINGE) ×4 IMPLANT
SYR CONTROL 10ML LL (SYRINGE) ×3 IMPLANT
TOWEL OR 17X26 10 PK STRL BLUE (TOWEL DISPOSABLE) ×9 IMPLANT

## 2014-10-09 NOTE — Consult Note (Signed)
Reason for Consult:history of seizures. Referring Physician: Dr. Rolena Infante.  Phillip [name] is an 48 y.o. male.  HPI: history of alcoholism had a fall yesterday while walking his dog. Apparently his dog got aggravated by another dog and had pulled the patient down. Since then patient has been having pain in the right hip and unable to ambulate was brought to the ER by EMS. Patient denies having hit his head or loss consciousness yesterday after the fall. X-rays revealed right hip fracture and orthopedic surgeon Dr. Rolena Infante was consulted. Dr. Rolena Infante wants medical consult because patient has had a history of seizures. Patient had one seizure last month around September 28 witnessed by his friend. Patient states is the only episode of seizure he has had and at that time he was trying to decrease his alcohol intake. Patient drinks alcohol everyday. He was brought to the ER and at that time CT head was negative for anything acute. Patient otherwise denies any chest pain shortness of breath nausea vomiting visual symptoms palpitations abdominal pain. Patient sometimes takes tramadol which he has not been taking for now.  History reviewed. No pertinent past medical history.  Past Surgical History  Procedure Laterality Date  . Knee surgery      right knee surgery 1988    Family History  Problem Relation Age of Onset  . Hypertension Mother     Social History:  reports that he has been smoking Cigarettes.  He has been smoking about 0.30 packs per day. He has never used smokeless tobacco. He reports that he drinks alcohol. He reports that he does not use illicit drugs.  Allergies: No Known Allergies  Medications: I have reviewed the patient's current medications.  Results for orders placed during the hospital encounter of 10/09/14 (from the past 48 hour(s))  COMPREHENSIVE METABOLIC PANEL     Status: Abnormal   Collection Time    10/09/14  5:02 PM      Result Value Ref Range   Sodium 139  137 - 147  mEq/L   Potassium 3.7  3.7 - 5.3 mEq/L   Chloride 98  96 - 112 mEq/L   CO2 24  19 - 32 mEq/L   Glucose, Bld 92  70 - 99 mg/dL   BUN 4 (*) 6 - 23 mg/dL   Creatinine, Ser 0.61  0.50 - 1.35 mg/dL   Calcium 9.6  8.4 - 10.5 mg/dL   Total Protein 8.9 (*) 6.0 - 8.3 g/dL   Albumin 4.3  3.5 - 5.2 g/dL   AST 41 (*) 0 - 37 U/L   ALT 37  0 - 53 U/L   Alkaline Phosphatase 95  39 - 117 U/L   Total Bilirubin 0.7  0.3 - 1.2 mg/dL   GFR calc non Af Amer >90  >90 mL/min   GFR calc Af Amer >90  >90 mL/min   Comment: (NOTE)     The eGFR has been calculated using the CKD EPI equation.     This calculation has not been validated in all clinical situations.     eGFR's persistently <90 mL/min signify possible Chronic Kidney     Disease.   Anion gap 17 (*) 5 - 15  CBC WITH DIFFERENTIAL     Status: None   Collection Time    10/09/14  5:02 PM      Result Value Ref Range   WBC 6.5  4.0 - 10.5 K/uL   RBC 4.51  4.22 - 5.81 MIL/uL  Hemoglobin 14.8  13.0 - 17.0 g/dL   HCT 42.9  39.0 - 52.0 %   MCV 95.1  78.0 - 100.0 fL   MCH 32.8  26.0 - 34.0 pg   MCHC 34.5  30.0 - 36.0 g/dL   RDW 12.7  11.5 - 15.5 %   Platelets 299  150 - 400 K/uL   Neutrophils Relative % 65  43 - 77 %   Neutro Abs 4.2  1.7 - 7.7 K/uL   Lymphocytes Relative 23  12 - 46 %   Lymphs Abs 1.5  0.7 - 4.0 K/uL   Monocytes Relative 12  3 - 12 %   Monocytes Absolute 0.8  0.1 - 1.0 K/uL   Eosinophils Relative 0  0 - 5 %   Eosinophils Absolute 0.0  0.0 - 0.7 K/uL   Basophils Relative 0  0 - 1 %   Basophils Absolute 0.0  0.0 - 0.1 K/uL  TYPE AND SCREEN     Status: None   Collection Time    10/09/14  5:27 PM      Result Value Ref Range   ABO/RH(D) O POS     Antibody Screen NEG     Sample Expiration 10/12/2014    URINALYSIS, ROUTINE W REFLEX MICROSCOPIC     Status: Abnormal   Collection Time    10/09/14  6:34 PM      Result Value Ref Range   Color, Urine AMBER (*) YELLOW   Comment: BIOCHEMICALS MAY BE AFFECTED BY COLOR   APPearance  CLEAR  CLEAR   Specific Gravity, Urine 1.017  1.005 - 1.030   pH 5.5  5.0 - 8.0   Glucose, UA NEGATIVE  NEGATIVE mg/dL   Hgb urine dipstick NEGATIVE  NEGATIVE   Bilirubin Urine NEGATIVE  NEGATIVE   Ketones, ur NEGATIVE  NEGATIVE mg/dL   Protein, ur NEGATIVE  NEGATIVE mg/dL   Urobilinogen, UA 1.0  0.0 - 1.0 mg/dL   Nitrite NEGATIVE  NEGATIVE   Leukocytes, UA NEGATIVE  NEGATIVE   Comment: MICROSCOPIC NOT DONE ON URINES WITH NEGATIVE PROTEIN, BLOOD, LEUKOCYTES, NITRITE, OR GLUCOSE <1000 mg/dL.  URINE RAPID DRUG SCREEN (HOSP PERFORMED)     Status: Abnormal   Collection Time    10/09/14  6:34 PM      Result Value Ref Range   Opiates POSITIVE (*) NONE DETECTED   Cocaine NONE DETECTED  NONE DETECTED   Benzodiazepines NONE DETECTED  NONE DETECTED   Amphetamines NONE DETECTED  NONE DETECTED   Tetrahydrocannabinol POSITIVE (*) NONE DETECTED   Barbiturates NONE DETECTED  NONE DETECTED   Comment:            DRUG SCREEN FOR MEDICAL PURPOSES     ONLY.  IF CONFIRMATION IS NEEDED     FOR ANY PURPOSE, NOTIFY LAB     WITHIN 5 DAYS.                LOWEST DETECTABLE LIMITS     FOR URINE DRUG SCREEN     Drug Class       Cutoff (ng/mL)     Amphetamine      1000     Barbiturate      200     Benzodiazepine   952     Tricyclics       841     Opiates          300     Cocaine  300     THC              50    Dg Hip Complete Right  10/09/2014   CLINICAL DATA:  Patient fell 1 day prior while walking dog  EXAM: RIGHT HIP - COMPLETE 2+ VIEW  COMPARISON:  None.  FINDINGS: Frontal pelvis as well as frontal and lateral right hip images were obtained. There is a comminuted intertrochanteric femur fracture on the right with varus angulation at the fracture site. There is fragmentation of the greater trochanter on the right. No other fractures. No dislocation. Joint spaces appear intact.  IMPRESSION: Comminuted intertrochanteric femur fracture on the right with varus angulation at the fracture site.    Electronically Signed   By: Lowella Grip M.D.   On: 10/09/2014 16:13    Review of Systems  Constitutional: Negative.   HENT: Negative.   Eyes: Negative.   Respiratory: Negative.   Cardiovascular: Negative.   Gastrointestinal: Negative.   Genitourinary: Negative.   Musculoskeletal:       Right hip pain.  Skin: Negative.   Neurological: Negative.   Endo/Heme/Allergies: Negative.   Psychiatric/Behavioral: Negative.    Blood pressure 154/103, pulse 104, temperature 99.3 F (37.4 C), temperature source Oral, resp. rate 15, SpO2 99.00%. Physical Exam  Constitutional: He is oriented to person, place, and time. He appears well-developed and well-nourished. No distress.  HENT:  Head: Normocephalic and atraumatic.  Eyes: Conjunctivae are normal. Pupils are equal, round, and reactive to light. Right eye exhibits no discharge. Left eye exhibits no discharge. No scleral icterus.  Neck: Normal range of motion. Neck supple.  Cardiovascular:  Tachycardia.  Respiratory: Effort normal and breath sounds normal. No respiratory distress. He has no wheezes. He has no rales.  GI: Soft. Bowel sounds are normal. He exhibits no distension. There is no tenderness. There is no rebound.  Musculoskeletal:  Pain on moving right hip pain.  Neurological: He is alert and oriented to person, place, and time. No cranial nerve deficit.  Skin: Skin is warm and dry. He is not diaphoretic.  Psychiatric: His behavior is normal.    Assessment/Plan: #1. History of seizures/convulsions - patient states he has had only one episode last month and at that time he was trying to cut down his alcohol intake. I have discussed with on-call  Neurologist Dr. Doy Mince and at this time Dr. Doy Mince feels that patient's seizures may have been precipitated by patient trying to decrease alcohol intake. CT of the head was negative at that time and patient has not had any previous or any further seizures and then.At this time  neurologist has recommended to keep patient on alcohol withdrawal protocol using Ativan. Patient should not take tramadol. #2. History of alcoholism - patient has been placed on alcohol withdrawal protocol and I have discussed with Dr. Rolena Infante about this. Social worker consult for alcoholism. Thiamine. #3. Right hip fracture - as per orthopedics. #4. Patient appears to have mild fever - follow UA and chest x-ray.  Thanks for involving [place] in patient's care we will follow along with you.  Rise Patience. 10/09/2014, 9:19 PM

## 2014-10-09 NOTE — Transfer of Care (Signed)
Immediate Anesthesia Transfer of Care Note  Patient: Phillip [name]  Procedure(s) Performed: Procedure(s): INTRAMEDULLARY (IM) NAIL INTERTROCHANTRIC Right knee aspiration (Right)  Patient Location: PACU  Anesthesia Type:General  Level of Consciousness: awake, oriented, patient cooperative, lethargic and responds to stimulation  Airway & Oxygen Therapy: Patient Spontanous Breathing and Patient connected to face mask oxygen  Post-op Assessment: Report given to PACU RN, Post -op Vital signs reviewed and stable and Patient moving all extremities  Post vital signs: Reviewed and stable  Complications: No apparent anesthesia complications

## 2014-10-09 NOTE — ED Notes (Signed)
Dr. Rolena Infante has just spoken with pt.  [facility]--they are ready to receive him.  I will transport him shortly.  CHG bath performed.

## 2014-10-09 NOTE — Anesthesia Procedure Notes (Signed)
Procedure Name: Intubation Date/Time: 10/09/2014 10:14 PM Performed by: Ofilia Neas Pre-anesthesia Checklist: Patient identified, Timeout performed, Emergency Drugs available, Suction available and Patient being monitored Patient Re-evaluated:Patient Re-evaluated prior to inductionOxygen Delivery Method: Circle system utilized Preoxygenation: Pre-oxygenation with 100% oxygen Intubation Type: IV induction, Rapid sequence and Cricoid Pressure applied Laryngoscope Size: Mac and 4 Grade View: Grade II Tube type: Oral Tube size: 7.5 mm Number of attempts: 1 Airway Equipment and Method: Stylet Placement Confirmation: ETT inserted through vocal cords under direct vision,  positive ETCO2 and breath sounds checked- equal and bilateral Secured at: 21 cm Tube secured with: Tape Dental Injury: Teeth and Oropharynx as per pre-operative assessment

## 2014-10-09 NOTE — ED Notes (Signed)
He states he fell yesterday while walking his dog, when his dog reacted to another aggressive dog, causing him to trip and fall.  He c/o right hip pain. CMS intact all toes bilat.  He is awake, alert and in no distress.

## 2014-10-09 NOTE — H&P (Signed)
No PCP Per Patient Chief Complaint: Right hip pain History: Patient states he was walking his dog yesterday when he fell and landed on a rock.  Has been unable to ambulate on the leg since.  Presents today to ER because of pain and inability to walk since fall yesterday.  Xrays demonstrate hip fracture and Ortho consult requested.  No Known Allergies  No current facility-administered medications on file prior to encounter.   Current Outpatient Prescriptions on File Prior to Encounter  Medication Sig Dispense Refill  . meloxicam (MOBIC) 15 MG tablet Take 1 tablet (15 mg total) by mouth daily.  30 tablet  0    Physical Exam: Filed Vitals:   10/09/14 1703  BP: 167/98  Pulse: 113  Temp:   Resp: 16  A+O X3 Compartments soft/NT NVI - EHL/TA/GA intact.  Sensation to LT intact.  DP/PT pulses intact  No SOB/CP Abd soft/NT Right hip - no laceration/contusion Pain with palpation Grossly deformed hip with rotational deformity. Swollen, tender right knee.  Unable to test ligament integrity due to hip fracture.   Image: Dg Hip Complete Right  10/09/2014   CLINICAL DATA:  Patient fell 1 day prior while walking dog  EXAM: RIGHT HIP - COMPLETE 2+ VIEW  COMPARISON:  None.  FINDINGS: Frontal pelvis as well as frontal and lateral right hip images were obtained. There is a comminuted intertrochanteric femur fracture on the right with varus angulation at the fracture site. There is fragmentation of the greater trochanter on the right. No other fractures. No dislocation. Joint spaces appear intact.  IMPRESSION: Comminuted intertrochanteric femur fracture on the right with varus angulation at the fracture site.   Electronically Signed   By: Lowella Grip M.D.   On: 10/09/2014 16:13    A/P: Otherwise healthy 85 yr ol man who fell yesterday with inability to walk and right hip pain.  Xrays demonstrate displaced right intertroch fracture.   Patient also noted to have history of right knee pain - now  with acutely swollen knee. No fracture noted on xray. Plan for troch nail tonight.  Risks reviewed with patient and sister.  Include death, stroke, paralysis, failure to heal, mal-union, post-traumatic arthritis, need for further surgery, pain, difficulty walking.   Consent obtained for ORIF of right hip as well as aspiration and exam under anesthesia of the right knee Will consult medical team post-op due to history of Sz disorder.

## 2014-10-09 NOTE — ED Provider Notes (Signed)
X-ray of right hip reviewed by me  Orlie Dakin, MD 10/09/14 [phone]

## 2014-10-09 NOTE — ED Notes (Signed)
Patient transported to X-ray 

## 2014-10-09 NOTE — Anesthesia Preprocedure Evaluation (Addendum)
Anesthesia Evaluation  Patient identified by MRN, date of birth, ID band Patient awake    Reviewed: Allergy & Precautions, H&P , NPO status , Patient's Chart, lab work & pertinent test results  Airway Mallampati: II  TM Distance: >3 FB Neck ROM: full    Dental  (+) Missing, Dental Advisory Given All but 2 front upper missing on top:   Pulmonary neg pulmonary ROS, Current Smoker,  breath sounds clear to auscultation  Pulmonary exam normal       Cardiovascular Exercise Tolerance: Good negative cardio ROS  Rhythm:regular Rate:Normal  LAFB   Neuro/Psych negative neurological ROS  negative psych ROS   GI/Hepatic negative GI ROS, Neg liver ROS,   Endo/Other  negative endocrine ROS  Renal/GU negative Renal ROS  negative genitourinary   Musculoskeletal   Abdominal   Peds  Hematology negative hematology ROS (+)   Anesthesia Other Findings   Reproductive/Obstetrics negative OB ROS                           Anesthesia Physical Anesthesia Plan  ASA: II  Anesthesia Plan: General   Post-op Pain Management:    Induction: Intravenous  Airway Management Planned: Oral ETT  Additional Equipment:   Intra-op Plan:   Post-operative Plan: Extubation in OR  Informed Consent: I have reviewed the patients History and Physical, chart, labs and discussed the procedure including the risks, benefits and alternatives for the proposed anesthesia with the patient or authorized representative who has indicated his/her understanding and acceptance.   Dental Advisory Given  Plan Discussed with: CRNA and Surgeon  Anesthesia Plan Comments:         Anesthesia Quick Evaluation

## 2014-10-09 NOTE — ED Notes (Signed)
Bed: DV44 Expected date: 10/09/14 Expected time: 2:57 PM Means of arrival: Ambulance Comments: Fall  Hip pain, shortening and rotation

## 2014-10-09 NOTE — ED Provider Notes (Signed)
Patient slipped and fell yesterday injuring his right hip on a rock. He complains of right hip pain. No other injury. On exam patient alert Glasgow coma score 15 HEENT exam does fact atraumatic neck supple. Abdomen nondistended nontender. Pelvis stable. Right lower extremity short Rotated. Tender hip. DP pulses 2+ all extremities or contusion abrasion or tenderness neurovascular intact neurologic Glasgow Coma Score 15 creatinine is 2 through 12 grossly intact moves all extremities  Orlie Dakin, MD 10/09/14 [phone]

## 2014-10-09 NOTE — Brief Op Note (Signed)
10/09/2014  11:40 PM  PATIENT:  Phillip [name]  48 y.o. male  PRE-OPERATIVE DIAGNOSIS:  Right intertrochantric hip fracture  POST-OPERATIVE DIAGNOSIS:  Right Intertrochantric hip fracture  PROCEDURE:  Procedure(s): INTRAMEDULLARY (IM) NAIL INTERTROCHANTRIC Right knee aspiration (Right)  SURGEON:  Surgeon(s) and Role:    * Melina Schools, MD - Primary  PHYSICIAN ASSISTANT:   ASSISTANTS: none   ANESTHESIA:   general  EBL:  Total I/O In: 1000 [I.V.:1000] Out: 75 [Urine:50; Blood:25]  BLOOD ADMINISTERED:none  DRAINS: none   LOCAL MEDICATIONS USED:  MARCAINE     SPECIMEN:  Aspirate  DISPOSITION OF SPECIMEN:  Microbiology  COUNTS:  YES  TOURNIQUET:  * No tourniquets in log *  DICTATION: .Other Dictation: Dictation Number [id number]  PLAN OF CARE: Admit to inpatient   PATIENT DISPOSITION:  PACU - hemodynamically stable.

## 2014-10-09 NOTE — ED Provider Notes (Signed)
CSN: [id number]     Arrival date & time 10/09/14  1502 History   First MD Initiated Contact with Patient 10/09/14 1528     Chief Complaint  Patient presents with  . Hip Pain     (Consider location/radiation/quality/duration/timing/severity/associated sxs/prior Treatment) HPI Phillip [name] is a 48 y.o. male with no significant past medical history comes in for evaluation of hip pain after a fall. Patient states yesterday at approximately 2 PM in the afternoon he was walking his pit bull when his dog became aggravated by another dog and took off and pulled patient to the ground. Patient states he landed on a rock on his right leg and hip. He denies being ambulatory after the fall. He reports his friends had to take him home. He did not come to the ED yesterday for evaluation because no one was there to watch his dog. Patient does report right leg weakness primarily due to discomfort. No other back pain red flags.  History reviewed. No pertinent past medical history. Past Surgical History  Procedure Laterality Date  . Knee surgery      right knee surgery 1988   Family History  Problem Relation Age of Onset  . Hypertension Mother    History  Substance Use Topics  . Smoking status: Current Every Day Smoker -- 0.30 packs/day    Types: Cigarettes  . Smokeless tobacco: Never Used  . Alcohol Use: Yes    Review of Systems  Constitutional: Negative for fever.  HENT: Negative for sore throat.   Eyes: Negative for visual disturbance.  Respiratory: Negative for shortness of breath.   Cardiovascular: Negative for chest pain.  Gastrointestinal: Negative for abdominal pain.  Endocrine: Negative for polyuria.  Genitourinary: Negative for dysuria.  Musculoskeletal: Positive for arthralgias.  Skin: Negative for rash.  Neurological: Negative for headaches.      Allergies  Review of patient's allergies indicates no known allergies.  Home Medications   Prior to Admission medications    Medication Sig Start Date End Date Taking? Authorizing Provider  acetaminophen (TYLENOL) 500 MG tablet Take 500 mg by mouth once.   Yes Historical Provider, MD  meloxicam (MOBIC) 15 MG tablet Take 1 tablet (15 mg total) by mouth daily. 08/24/14  Yes Heather Laisure, PA-C  traMADol (ULTRAM) 50 MG tablet Take 50 mg by mouth every 6 (six) hours as needed for moderate pain.   Yes Historical Provider, MD   BP 178/103  Pulse 102  Temp(Src) 99.3 F (37.4 C) (Oral)  Resp 16  SpO2 98% Physical Exam  Nursing note and vitals reviewed. Constitutional:  Awake, alert, nontoxic appearance with baseline speech.  HENT:  Head: Atraumatic.  Eyes: Pupils are equal, round, and reactive to light. Right eye exhibits no discharge. Left eye exhibits no discharge.  Neck: Neck supple.  Cardiovascular: Normal rate and regular rhythm.   No murmur heard. Pulmonary/Chest: Effort normal and breath sounds normal. No respiratory distress. He has no wheezes. He has no rales. He exhibits no tenderness.  Abdominal: Soft. Bowel sounds are normal. He exhibits no mass. There is no tenderness. There is no rebound.  Musculoskeletal:       Thoracic back: He exhibits no tenderness.       Lumbar back: He exhibits no tenderness.  No ecchymosis or erythema to lower extremity. No obvious lesions, abrasions or rashes appreciated Left lower extremity non tender without new rashes or color change, RLE tender at proximal femur with no tenderness with direct pressure to pelvis. Baseline  ROM distal to injury with intact DP / PT pulses, CR<2 secs all digits bilaterally, sensation baseline light touch bilaterally for pt,   Neurological:  Mental status baseline for patient.  Upper extremity motor strength and sensation intact and symmetric bilaterally. Patient does have weakness and right lower extremity is unable to flex right hip. Patient does have limited range of motion of right knee and ankle due to discomfort. Sensation intact. No  other focal neurodeficits.  Skin: No rash noted.  Psychiatric: He has a normal mood and affect.    ED Course  Procedures (including critical care time) Labs Review Labs Reviewed - No data to display  Imaging Review No results found.   EKG Interpretation None     Meds given in ED:  Medications  lactated ringers infusion (not administered)  HYDROmorphone (DILAUDID) injection 0.25-0.5 mg (not administered)  acetaminophen (OFIRMEV) IV 1,000 mg ( Intravenous MAR Hold 10/09/14 2150)  thiamine (B-1) injection 100 mg ( Intravenous MAR Hold 10/09/14 2150)  LORazepam (ATIVAN) tablet 0-4 mg ( Oral Automatically Held 10/11/14 1200)    Followed by  LORazepam (ATIVAN) tablet 0-4 mg ( Oral Automatically Held 10/13/14 1000)  methocarbamol (ROBAXIN) tablet 500 mg (not administered)    Or  methocarbamol (ROBAXIN) 500 mg in dextrose 5 % 50 mL IVPB (not administered)  morphine 4 MG/ML injection 4 mg (4 mg Intravenous Given 10/09/14 1659)  HYDROmorphone (DILAUDID) injection 1 mg (1 mg Intravenous Given 10/09/14 2019)  ceFAZolin (ANCEF) IVPB 2 g/50 mL premix (2 g Intravenous Given 10/09/14 2200)    New Prescriptions   No medications on file   Filed Vitals:   10/09/14 1513 10/09/14 1703 10/09/14 1955  BP: 178/103 167/98 154/103  Pulse: 102 113 104  Temp: 99.3 F (37.4 C)    TempSrc: Oral    Resp: 16 16 15   SpO2: 98% 100% 99%    MDM  Vitals stable -afebrile. Tachycardia nonspecific and likely due to pain. Pt stable and resting comfortably in ED.  Pain managed in ED.  PE-consistent with x-ray findings of intertrochanteric comminuted fracture of right proximal femur Discussed with Dr. Rolena Infante, Orthopedics, will take to OR now and admit pt. Requests Hospitalist see post-op due to hx of seizures. Spoke with Delford Field, will see post-op. Patient is neurovascularly intact with equal and intact distal pulses, compartments soft.  Discussed findings of x-ray as well as need for orthopedic  evaluation and surgery, patient amenable to plan. CIWA activated due to pt past hx of EtOH abuse.  Prior to pt admission, I discussed and reviewed this case with Dr. Winfred Leeds    Final diagnoses:  Pearletha Forge, PA-C 10/09/14 2359

## 2014-10-10 ENCOUNTER — Inpatient Hospital Stay (HOSPITAL_COMMUNITY): Payer: Managed Care, Other (non HMO)

## 2014-10-10 ENCOUNTER — Encounter (HOSPITAL_COMMUNITY): Payer: Self-pay | Admitting: *Deleted

## 2014-10-10 DIAGNOSIS — R739 Hyperglycemia, unspecified: Secondary | ICD-10-CM

## 2014-10-10 DIAGNOSIS — Z720 Tobacco use: Secondary | ICD-10-CM

## 2014-10-10 DIAGNOSIS — F1020 Alcohol dependence, uncomplicated: Secondary | ICD-10-CM

## 2014-10-10 DIAGNOSIS — S72001D Fracture of unspecified part of neck of right femur, subsequent encounter for closed fracture with routine healing: Secondary | ICD-10-CM

## 2014-10-10 LAB — CBC WITH DIFFERENTIAL/PLATELET
BASOS ABS: 0 10*3/uL (ref 0.0–0.1)
Basophils Relative: 0 % (ref 0–1)
EOS PCT: 0 % (ref 0–5)
Eosinophils Absolute: 0 10*3/uL (ref 0.0–0.7)
HEMATOCRIT: 33.3 % — AB (ref 39.0–52.0)
Hemoglobin: 11.3 g/dL — ABNORMAL LOW (ref 13.0–17.0)
LYMPHS ABS: 0.4 10*3/uL — AB (ref 0.7–4.0)
LYMPHS PCT: 5 % — AB (ref 12–46)
MCH: 33.1 pg (ref 26.0–34.0)
MCHC: 34.5 g/dL (ref 30.0–36.0)
MCV: 96 fL (ref 78.0–100.0)
MONO ABS: 0.3 10*3/uL (ref 0.1–1.0)
Monocytes Relative: 4 % (ref 3–12)
NEUTROS ABS: 7.3 10*3/uL (ref 1.7–7.7)
Neutrophils Relative %: 91 % — ABNORMAL HIGH (ref 43–77)
Platelets: 271 10*3/uL (ref 150–400)
RBC: 3.47 MIL/uL — AB (ref 4.22–5.81)
RDW: 12.8 % (ref 11.5–15.5)
WBC: 8 10*3/uL (ref 4.0–10.5)

## 2014-10-10 LAB — COMPREHENSIVE METABOLIC PANEL
ALT: 28 U/L (ref 0–53)
AST: 34 U/L (ref 0–37)
Albumin: 3.6 g/dL (ref 3.5–5.2)
Alkaline Phosphatase: 79 U/L (ref 39–117)
Anion gap: 11 (ref 5–15)
BUN: 6 mg/dL (ref 6–23)
CO2: 28 mEq/L (ref 19–32)
Calcium: 9.1 mg/dL (ref 8.4–10.5)
Chloride: 96 mEq/L (ref 96–112)
Creatinine, Ser: 0.62 mg/dL (ref 0.50–1.35)
GFR calc Af Amer: >90 mL/min (ref >90)
GFR calc non Af Amer: >90 mL/min (ref >90)
Glucose, Bld: 194 mg/dL — ABNORMAL HIGH (ref 70–99)
Potassium: 4.6 mEq/L (ref 3.7–5.3)
SODIUM: 135 meq/L — AB (ref 137–147)
TOTAL PROTEIN: 7.2 g/dL (ref 6.0–8.3)
Total Bilirubin: 0.7 mg/dL (ref 0.3–1.2)

## 2014-10-10 LAB — SYNOVIAL FLUID, CRYSTAL: CRYSTALS FLUID: NONE SEEN

## 2014-10-10 LAB — GLUCOSE, CAPILLARY
GLUCOSE-CAPILLARY: 203 mg/dL — AB (ref 70–99)
GLUCOSE-CAPILLARY: 139 mg/dL — AB (ref 70–99)
Glucose-Capillary: 124 mg/dL — ABNORMAL HIGH (ref 70–99)

## 2014-10-10 LAB — PROTEIN, SYNOVIAL FLUID: Protein, Synovial Fluid: 4.3 g/dL — ABNORMAL HIGH (ref 1.0–3.0)

## 2014-10-10 LAB — HEMOGLOBIN A1C
Hgb A1c MFr Bld: 5.5 % (ref <5.7)
Mean Plasma Glucose: 111 mg/dL (ref <117)

## 2014-10-10 LAB — GLUCOSE, SYNOVIAL FLUID: GLUCOSE, SYNOVIAL FLUID: 108 mg/dL

## 2014-10-10 MED ORDER — FERROUS SULFATE 325 (65 FE) MG PO TABS
325.0000 mg | ORAL_TABLET | Freq: Three times a day (TID) | ORAL | Status: DC
  Administered 2014-10-10 – 2014-10-12 (×7): 325 mg via ORAL
  Filled 2014-10-10 (×10): qty 1

## 2014-10-10 MED ORDER — HYDROMORPHONE HCL 1 MG/ML IJ SOLN
INTRAMUSCULAR | Status: AC
  Filled 2014-10-10: qty 1

## 2014-10-10 MED ORDER — METOCLOPRAMIDE HCL 5 MG/ML IJ SOLN: 5.0000 mg | Freq: Three times a day (TID) | INTRAMUSCULAR | Status: DC | PRN

## 2014-10-10 MED ORDER — THIAMINE HCL 100 MG/ML IJ SOLN
100.0000 mg | Freq: Every day | INTRAMUSCULAR | Status: DC
  Filled 2014-10-10 (×3): qty 1

## 2014-10-10 MED ORDER — MENTHOL 3 MG MT LOZG
1.0000 | LOZENGE | OROMUCOSAL | Status: DC | PRN
  Filled 2014-10-10: qty 9

## 2014-10-10 MED ORDER — ALUM & MAG HYDROXIDE-SIMETH 200-200-20 MG/5ML PO SUSP: 30.0000 mL | ORAL | Status: DC | PRN

## 2014-10-10 MED ORDER — MORPHINE SULFATE 2 MG/ML IJ SOLN
0.5000 mg | INTRAMUSCULAR | Status: DC | PRN
  Administered 2014-10-10 (×2): 0.5 mg via INTRAVENOUS
  Filled 2014-10-10 (×2): qty 1

## 2014-10-10 MED ORDER — LORAZEPAM 1 MG PO TABS
1.0000 mg | ORAL_TABLET | Freq: Four times a day (QID) | ORAL | Status: DC | PRN
  Administered 2014-10-10 – 2014-10-12 (×7): 1 mg via ORAL
  Filled 2014-10-10 (×8): qty 1

## 2014-10-10 MED ORDER — INSULIN ASPART 100 UNIT/ML ~~LOC~~ SOLN
0.0000 [IU] | Freq: Three times a day (TID) | SUBCUTANEOUS | Status: DC
  Administered 2014-10-10: 1 [IU] via SUBCUTANEOUS

## 2014-10-10 MED ORDER — ACETAMINOPHEN 325 MG PO TABS
650.0000 mg | ORAL_TABLET | Freq: Four times a day (QID) | ORAL | Status: DC | PRN
  Administered 2014-10-10 – 2014-10-11 (×2): 650 mg via ORAL
  Filled 2014-10-10 (×2): qty 2

## 2014-10-10 MED ORDER — FOLIC ACID 1 MG PO TABS
1.0000 mg | ORAL_TABLET | Freq: Every day | ORAL | Status: DC
  Administered 2014-10-10 – 2014-10-12 (×3): 1 mg via ORAL
  Filled 2014-10-10 (×4): qty 1

## 2014-10-10 MED ORDER — ONDANSETRON HCL 4 MG PO TABS: 4.0000 mg | ORAL_TABLET | Freq: Four times a day (QID) | ORAL | Status: DC | PRN

## 2014-10-10 MED ORDER — VITAMIN B-1 100 MG PO TABS
100.0000 mg | ORAL_TABLET | Freq: Every day | ORAL | Status: DC
  Administered 2014-10-10 – 2014-10-12 (×3): 100 mg via ORAL
  Filled 2014-10-10 (×4): qty 1

## 2014-10-10 MED ORDER — METOCLOPRAMIDE HCL 10 MG PO TABS: 5.0000 mg | ORAL_TABLET | Freq: Three times a day (TID) | ORAL | Status: DC | PRN

## 2014-10-10 MED ORDER — LORAZEPAM 2 MG/ML IJ SOLN: 1.0000 mg | Freq: Four times a day (QID) | INTRAMUSCULAR | Status: DC | PRN

## 2014-10-10 MED ORDER — ONDANSETRON HCL 4 MG/2ML IJ SOLN: 4.0000 mg | Freq: Four times a day (QID) | INTRAMUSCULAR | Status: DC | PRN

## 2014-10-10 MED ORDER — LORAZEPAM 2 MG/ML IJ SOLN: 0.0000 mg | Freq: Four times a day (QID) | INTRAMUSCULAR | Status: AC

## 2014-10-10 MED ORDER — LORAZEPAM 2 MG/ML IJ SOLN: 0.0000 mg | Freq: Two times a day (BID) | INTRAMUSCULAR | Status: DC

## 2014-10-10 MED ORDER — LACTATED RINGERS IV SOLN
INTRAVENOUS | Status: DC
  Administered 2014-10-10: 18:00:00 via INTRAVENOUS

## 2014-10-10 MED ORDER — ENOXAPARIN SODIUM 40 MG/0.4ML ~~LOC~~ SOLN
40.0000 mg | SUBCUTANEOUS | Status: DC
  Administered 2014-10-10 – 2014-10-11 (×2): 40 mg via SUBCUTANEOUS
  Filled 2014-10-10 (×3): qty 0.4

## 2014-10-10 MED ORDER — ADULT MULTIVITAMIN W/MINERALS CH
1.0000 | ORAL_TABLET | Freq: Every day | ORAL | Status: DC
  Administered 2014-10-10 – 2014-10-12 (×3): 1 via ORAL
  Filled 2014-10-10 (×4): qty 1

## 2014-10-10 MED ORDER — CEFAZOLIN SODIUM-DEXTROSE 2-3 GM-% IV SOLR
2.0000 g | Freq: Four times a day (QID) | INTRAVENOUS | Status: AC
  Administered 2014-10-10 (×2): 2 g via INTRAVENOUS
  Filled 2014-10-10 (×2): qty 50

## 2014-10-10 MED ORDER — HYDROCODONE-ACETAMINOPHEN 5-325 MG PO TABS
1.0000 | ORAL_TABLET | Freq: Four times a day (QID) | ORAL | Status: DC | PRN
  Administered 2014-10-10 – 2014-10-11 (×4): 2 via ORAL
  Filled 2014-10-10 (×4): qty 2

## 2014-10-10 MED ORDER — DOCUSATE SODIUM 100 MG PO CAPS
100.0000 mg | ORAL_CAPSULE | Freq: Two times a day (BID) | ORAL | Status: DC
  Administered 2014-10-10 – 2014-10-12 (×5): 100 mg via ORAL

## 2014-10-10 MED ORDER — ACETAMINOPHEN 650 MG RE SUPP
650.0000 mg | Freq: Four times a day (QID) | RECTAL | Status: DC | PRN
Start: 2014-10-10 — End: 2014-10-12

## 2014-10-10 MED ORDER — PHENOL 1.4 % MT LIQD
1.0000 | OROMUCOSAL | Status: DC | PRN
  Filled 2014-10-10: qty 177

## 2014-10-10 MED ORDER — OXYCODONE HCL 5 MG PO TABS
5.0000 mg | ORAL_TABLET | ORAL | Status: DC | PRN
  Administered 2014-10-10 – 2014-10-11 (×6): 10 mg via ORAL
  Filled 2014-10-10 (×7): qty 2

## 2014-10-10 MED ORDER — HYDRALAZINE HCL 20 MG/ML IJ SOLN: 10.0000 mg | INTRAMUSCULAR | Status: DC | PRN

## 2014-10-10 NOTE — Progress Notes (Signed)
PROGRESS NOTE  Phillip [name] EHM:[id number] DOB: [birth year] DOA: 10/09/2014 PCP: No PCP Per Patient  Brief history 48 year old male with a history of alcohol dependence and alcohol withdrawal seizure suffered a mechanical fall when his dog dragged him to the ground on 10/09/2014. The patient had right hip pain. He presented to the emergency department and x-rays revealed a right intertrochanteric femur fracture. Orthopedics, Dr. Rolena Infante was consulted. The patient underwent ORIF on 10/09/2014. Because of the patient's history of seizure consultation was obtained. The patient had an alcohol withdrawal seizure on 09/06/2014. He has never had any prior seizures, nor has he had any seizures since that period of time. During that time, the patient was transferred to decrease his alcohol intake.  Assessment/Plan: History of alcohol withdraw seizure -Discontinue tramadol as this decreases seizure threshold -No indication for antiepileptic treatment at this time -monitor for seizure activity Alcohol dependence -The patient drinks 2 x 40 oz beers daily in addition to a "small bottle" of liquor daily -Alcohol withdrawal protocol -last drink was 10/09/2014 at noon Right intertrochanteric femur fracture -Status post ORIF--Dr. Rolena Infante -PT/OT -Pain control -DVT prophylaxis per ortho Tobacco abuse -Tobacco cessation discussed Hyperglycemia -NovoLog sliding scale -Hemoglobin A1c   Family Communication:   Pt at beside Disposition Plan:   Home when medically stable       Procedures/Studies: Dg Hip Complete Right  10/10/2014   CLINICAL DATA:  Right hip fracture.  Subsequent encounter.  EXAM: RIGHT HIP - COMPLETE 2+ VIEW; DG C-ARM 1-60 MIN - NRPT [facility]  COMPARISON:  10/09/2014  FINDINGS: Multiple intraoperative spot films show placement of a sliding screw and intramedullary rod transfixing the intertrochanteric right hip fracture in anatomic alignment. Distal fixation screw is seen  within the intramedullary rod in the distal femur. No other fractures identified.  IMPRESSION: Internal fixation of right hip fracture in anatomic alignment.   Electronically Signed   By: Earle Gell M.D.   On: 10/10/2014 10:49   Dg Hip Complete Right  10/09/2014   CLINICAL DATA:  Patient fell 1 day prior while walking dog  EXAM: RIGHT HIP - COMPLETE 2+ VIEW  COMPARISON:  None.  FINDINGS: Frontal pelvis as well as frontal and lateral right hip images were obtained. There is a comminuted intertrochanteric femur fracture on the right with varus angulation at the fracture site. There is fragmentation of the greater trochanter on the right. No other fractures. No dislocation. Joint spaces appear intact.  IMPRESSION: Comminuted intertrochanteric femur fracture on the right with varus angulation at the fracture site.   Electronically Signed   By: Lowella Grip M.D.   On: 10/09/2014 16:13   Dg Femur Right  10/09/2014   CLINICAL DATA:  [place] while walking dog with right femoral pain  EXAM: RIGHT FEMUR - 2 VIEW  COMPARISON:  None.  FINDINGS: There is an intratrochanteric fracture of the proximal right femur. Some impaction angulation at the fracture site is noted. Large right knee joint effusion is noted. No other focal abnormality is noted.  IMPRESSION: Proximal right femoral fracture.  Large joint effusion in the right knee joint   Electronically Signed   By: Inez Catalina M.D.   On: 10/09/2014 18:20   Pelvis Portable  10/10/2014   CLINICAL DATA:  Status post ORIF of right hip fracture.  EXAM: PORTABLE PELVIS 1-2 VIEWS  COMPARISON:  10/09/2014  FINDINGS: The patient is status open reduction and internal fixation of the intertrochanteric fracture of  the proximal right femur. There is an intra medullary rod earlier rod and screw device in place. The hardware components and fracture fragments are in anatomic alignment. Gas is identified within the soft tissues around the proximal right femur.  IMPRESSION: 1.  Status post ORIF of proximal right femur fracture.   Electronically Signed   By: Kerby Moors M.D.   On: 10/10/2014 04:18   Dg Chest Port 1 View  10/09/2014   CLINICAL DATA:  Preoperative for hip fracture.  EXAM: PORTABLE CHEST - 1 VIEW  COMPARISON:  Apr 18, 2008  FINDINGS: Lungs are clear. Heart size and pulmonary vascularity are normal. No adenopathy. No pneumothorax. No bone lesions.  IMPRESSION: No edema or consolidation.   Electronically Signed   By: Lowella Grip M.D.   On: 10/09/2014 20:06   Dg C-arm 1-60 Min-no Report  10/09/2014   CLINICAL DATA: right hip fracture Im nail surgery   C-ARM 1-60 MINUTES  Fluoroscopy was utilized by the requesting physician.  No radiographic  interpretation.          Subjective: Patient denies fevers, chills, headache, chest pain, dyspnea, nausea, vomiting, diarrhea, abdominal pain, dysuria, hematuria   Objective: Filed Vitals:   10/10/14 0200 10/10/14 0330 10/10/14 0517 10/10/14 1005  BP: 153/88 135/88  142/98  Pulse: 94  88 91  Temp: 97.5 F (36.4 C) 99.7 F (37.6 C)  98.4 F (36.9 C)  TempSrc: Oral   Oral  Resp: 20 18  18   SpO2: 100%   99%    Intake/Output Summary (Last 24 hours) at 10/10/14 1209 Last data filed at 10/10/14 1055  Gross per 24 hour  Intake   2050 ml  Output   1650 ml  Net    400 ml   Weight change:  Exam:   General:  Pt is alert, follows commands appropriately, not in acute distress  HEENT: No icterus, No thrush,  Bee/AT  Cardiovascular: RRR, S1/S2, no rubs, no gallops  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 No wheezing. Good air movement  Abdomen: Soft/+BS, non tender, non distended, no guarding  Extremities: No edema, No lymphangitis, No petechiae, No rashes, no synovitis  Data Reviewed: Basic Metabolic Panel:  Recent Labs Lab 10/09/14 1702 10/10/14 0532  NA 139 135*  K 3.7 4.6  CL 98 96  CO2 24 28  GLUCOSE 92 194*  BUN 4* 6  CREATININE 0.61 0.62  CALCIUM 9.6 9.1   Liver  Function Tests:  Recent Labs Lab 10/09/14 1702 10/10/14 0532  AST 41* 34  ALT 37 28  ALKPHOS 95 79  BILITOT 0.7 0.7  PROT 8.9* 7.2  ALBUMIN 4.3 3.6   No results for input(s): LIPASE, AMYLASE in the last 168 hours. No results for input(s): AMMONIA in the last 168 hours. CBC:  Recent Labs Lab 10/09/14 1702 10/10/14 0532  WBC 6.5 8.0  NEUTROABS 4.2 7.3  HGB 14.8 11.3*  HCT 42.9 33.3*  MCV 95.1 96.0  PLT 299 271   Cardiac Enzymes: No results for input(s): CKTOTAL, CKMB, CKMBINDEX, TROPONINI in the last 168 hours. BNP: Invalid input(s): POCBNP CBG: No results for input(s): GLUCAP in the last 168 hours.  No results found for this or any previous visit (from the past 240 hour(s)).   Scheduled Meds: .  ceFAZolin (ANCEF) IV  2 g Intravenous Q6H  . docusate sodium  100 mg Oral BID  . enoxaparin (LOVENOX) injection  40 mg Subcutaneous Q24H  . ferrous sulfate  325 mg Oral TID  PC  . folic acid  1 mg Oral Daily  . HYDROmorphone      . HYDROmorphone      . LORazepam  0-4 mg Intravenous Q6H   Followed by  . [START ON 10/12/2014] LORazepam  0-4 mg Intravenous Q12H  . multivitamin with minerals  1 tablet Oral Daily  . thiamine  100 mg Oral Daily   Or  . thiamine  100 mg Intravenous Daily   Continuous Infusions: . lactated ringers       Danecia Underdown, DO  [facility] Pager [phone]  If 7PM-7AM, please contact night-coverage [email or web address] Password [email or web address] 10/10/2014, 12:09 PM   LOS: 1 day

## 2014-10-10 NOTE — Plan of Care (Signed)
Problem: Phase I Progression Outcomes Goal: CMS/Neurovascular status WDL Outcome: Completed/Met Date Met:  10/10/14 Goal: Pain controlled with appropriate interventions Outcome: Completed/Met Date Met:  10/10/14 Goal: Dangle or out of bed evening of surgery Outcome: Completed/Met Date Met:  10/10/14 Goal: Hemodynamically stable Outcome: Completed/Met Date Met:  10/10/14

## 2014-10-10 NOTE — Progress Notes (Signed)
Subjective: Doing well.  Pain controlled.     Objective: Vital signs in last 24 hours: Temp:  [97.5 F (36.4 C)-99.7 F (37.6 C)] 98.4 F (36.9 C) (11/01 1005) Pulse Rate:  [82-113] 91 (11/01 1005) Resp:  [10-20] 18 (11/01 1005) BP: (135-219)/(88-118) 142/98 mmHg (11/01 1005) SpO2:  [98 %-100 %] 99 % (11/01 1005)  Intake/Output from previous day: 10/31 0701 - 11/01 0700 In: 2050 [P.O.:600; I.V.:1450] Out: 1000 [Urine:975; Blood:25] Intake/Output this shift: Total I/O In: -  Out: 650 [Urine:650]   Recent Labs  10/09/14 1702 10/10/14 0532  HGB 14.8 11.3*    Recent Labs  10/09/14 1702 10/10/14 0532  WBC 6.5 8.0  RBC 4.51 3.47*  HCT 42.9 33.3*  PLT 299 271    Recent Labs  10/09/14 1702 10/10/14 0532  NA 139 135*  K 3.7 4.6  CL 98 96  CO2 24 28  BUN 4* 6  CREATININE 0.61 0.62  GLUCOSE 92 194*  CALCIUM 9.6 9.1   No results for input(s): LABPT, INR in the last 72 hours.  Exam:  Alert and oriented.  Dressing C/D/I.  Calf nontender, nvi.    Assessment/Plan: Continue present care.    Phillip [name] M 10/10/2014, 12:39 PM

## 2014-10-10 NOTE — Anesthesia Postprocedure Evaluation (Signed)
  Anesthesia Post-op Note  Patient: Phillip [name]  Procedure(s) Performed: Procedure(s) (LRB): INTRAMEDULLARY (IM) NAIL INTERTROCHANTRIC Right knee aspiration (Right)  Patient Location: PACU  Anesthesia Type: General  Level of Consciousness: awake and alert   Airway and Oxygen Therapy: Patient Spontanous Breathing  Post-op Pain: mild  Post-op Assessment: Post-op Vital signs reviewed, Patient's Cardiovascular Status Stable, Respiratory Function Stable, Patent Airway and No signs of Nausea or vomiting  Last Vitals:  Filed Vitals:   10/10/14 0000  BP: 187/118  Pulse: 100  Temp:   Resp: 16    Post-op Vital Signs: stable   Complications: No apparent anesthesia complications

## 2014-10-10 NOTE — Evaluation (Signed)
Physical Therapy Evaluation Patient Details Name: EDGAR REISZ MRN: [id number] DOB: 16-Jan-1966 Today's Date: 10/10/2014   History of Present Illness  R hip fx with IM nail and R knee arthrosis s/p aspiration  Clinical Impression  Pt s/p fall with R hip fx presents with decreased R LE strength/ROM, post op pain and PWB status limiting functional mobility.  Pt hopes to progress to d/c home alone with limited assist.    Follow Up Recommendations Home health PT    Equipment Recommendations  Rolling walker with 5" wheels    Recommendations for Other Services OT consult     Precautions / Restrictions Precautions Precautions: Fall Restrictions Weight Bearing Restrictions: Yes RLE Weight Bearing: Partial weight bearing RLE Partial Weight Bearing Percentage or Pounds: 50%      Mobility  Bed Mobility Overal bed mobility: Needs Assistance Bed Mobility: Supine to Sit     Supine to sit: Min assist     General bed mobility comments: cues for sequence and use of L LE to self assist  Transfers Overall transfer level: Needs assistance Equipment used: Rolling walker (2 wheeled) Transfers: Sit to/from Stand Sit to Stand: Min assist         General transfer comment: cues for LE management and use of UEs to self assist.    Ambulation/Gait Ambulation/Gait assistance: Min assist Ambulation Distance (Feet): 36 Feet Assistive device: Rolling walker (2 wheeled) Gait Pattern/deviations: Step-to pattern;Decreased step length - right;Decreased step length - left;Shuffle;Trunk flexed Gait velocity: decr   General Gait Details: cues for sequence, posture, stride length and position from RW.  Pt notably shaky with activity  Stairs            Wheelchair Mobility    Modified Rankin (Stroke Patients Only)       Balance                                             Pertinent Vitals/Pain Pain Assessment: 0-10 Pain Score: 8  Pain Location: R hip Pain  Descriptors / Indicators: Sore Pain Intervention(s): Limited activity within patient's tolerance;Monitored during session;Premedicated before session;Ice applied    Home Living Family/patient expects to be discharged to:: Private residence Living Arrangements: Alone Available Help at Discharge: Family Type of Home: Apartment Home Access: Stairs to enter   Technical brewer of Steps: 1 Home Layout: One level Home Equipment: None Additional Comments: Pt states has son (currently caring for dog that caused fall)    Prior Function Level of Independence: Independent         Comments: Walked 5-6 miles a day     Hand Dominance        Extremity/Trunk Assessment   Upper Extremity Assessment: Overall WFL for tasks assessed           Lower Extremity Assessment: RLE deficits/detail;LLE deficits/detail RLE Deficits / Details: Hip strength 2/5 with AAROM at hip to 75 flex adn 10 abd; R knee flex to 90 LLE Deficits / Details: Pt reports ltd ROM at L knee but able to bend to ~ 100  Cervical / Trunk Assessment: Normal  Communication   Communication: No difficulties  Cognition Arousal/Alertness: Awake/alert Behavior During Therapy: WFL for tasks assessed/performed Overall Cognitive Status: Within Functional Limits for tasks assessed                      General Comments  Exercises General Exercises - Lower Extremity Ankle Circles/Pumps: AROM;Both;15 reps;Supine Quad Sets: AROM;Both;10 reps;Supine Heel Slides: AAROM;Right;15 reps;Supine Hip ABduction/ADduction: AAROM;Right;10 reps;Supine      Assessment/Plan    PT Assessment Patient needs continued PT services  PT Diagnosis Difficulty walking   PT Problem List Decreased strength;Decreased range of motion;Decreased activity tolerance;Decreased mobility;Decreased balance;Decreased knowledge of use of DME;Pain;Decreased safety awareness  PT Treatment Interventions DME instruction;Gait training;Stair  training;Functional mobility training;Therapeutic activities;Therapeutic exercise;Patient/family education   PT Goals (Current goals can be found in the Care Plan section) Acute Rehab PT Goals Patient Stated Goal: HOME PT Goal Formulation: With patient Time For Goal Achievement: 10/17/14 Potential to Achieve Goals: Good    Frequency 7X/week   Barriers to discharge Decreased caregiver support Pt states lives alone and dog will help.    Co-evaluation               End of Session Equipment Utilized During Treatment: Gait belt Activity Tolerance: Patient tolerated treatment well Patient left: in chair;with call bell/phone within reach Nurse Communication: Mobility status         Time: 1340-1421 PT Time Calculation (min): 41 min   Charges:   PT Evaluation $Initial PT Evaluation Tier I: 1 Procedure PT Treatments $Gait Training: 8-22 mins $Therapeutic Exercise: 8-22 mins   PT G Codes:          Pernella Ackerley 10/10/2014, 3:18 PM

## 2014-10-10 NOTE — Progress Notes (Signed)
OT Cancellation Note  Patient Details Name: Phillip [name] MRN: [id number] DOB: 02-21-1966   Cancelled Treatment:    Reason Eval/Treat Not Completed: Fatigue/lethargy limiting ability to participate. Pt with late surgery. Will continue to follow.  Malka So 10/10/2014, 11:16 AM

## 2014-10-10 NOTE — Op Note (Signed)
NAME:  Phillip [name], Phillip [name] NO.:  [id number]  MEDICAL RECORD NO.:  [id number]  LOCATION:  [facility]                         FACILITY:  [facility]  PHYSICIAN:  Dahlia Bailiff, MD    DATE OF BIRTH:  06-Mar-1966  DATE OF PROCEDURE:  10/09/2014 DATE OF DISCHARGE:                              OPERATIVE REPORT   PREOPERATIVE DIAGNOSES: 1. Right intertrochanteric hip fracture. 2. Right knee arthrosis. 3. Right knee swelling.  POSTOPERATIVE DIAGNOSES: 1. Right intertrochanteric hip fracture. 2. Right knee arthrosis. 3. Right knee swelling.  OPERATIVE PROCEDURE:  Open reduction and internal fixation with IM nail of the right femur.  Nail utilized was the [employer]omet trochanteric nail 440 mm length with a 105 locking lag screw and a distal 40-mm statically locked screw, and aspiration of right knee, aspirated total of 140 mL of synovial joint fluid.  No evidence of significant hemarthrosis.  HISTORY:  This is a very pleasant 48 year old gentleman who fell yesterday (Friday) and has been unable to walk.  He is brought to the ER.  X-rays demonstrated an intertrochanteric fracture.  After obtaining informed consent, we elected to take him to the operating room for an ORIF of his hip and aspiration of the knee.  All appropriate risks, benefits, and alternatives were discussed.  Consent was obtained.  OPERATIVE NOTE:  The patient was brought to the operating room, placed supine on the operating table.  After successful induction of general anesthesia and endotracheal intubation, Foley was applied and he was properly positioned on the bed.  The right leg which was marked in the preop holding area was placed into traction, the left was placed in a leg holder.  A gentle closed reduction maneuver was performed and confirmed with fluoroscopic x-ray.  The leg was then prepped and draped from hip to knee.  Time-out was taken confirming patient, procedure, and all other pertinent important data.   A 1 inch incision was made proximal to the greater trochanter and a guide pin was placed to the tip of the trochanter and advanced into the troch and down into the proximal femur. I confirmed trajectory in position in both the AP and lateral planes.  A one-step reaming awl was then placed and drilled down.  A guide pin was then placed down the entire femur and confirmed position in both planes. The femur was then reamed up to 12 to accommodate the 10 nail.  An appropriate size nail was then gently placed to the appropriate depth. Its position was confirmed in both planes.  The fracture remained reduced.  A second incision was made along the lateral aspect of the femur and the guide for the lag screw was inserted.  The pin was then placed and properly positioned just below the femoral head in both planes.  A second guidepin was placed as a derotation pin.  I then measured and then reamed for the screw to a depth of 105.  I then placed a lag screw and compressed it.  I removed the derotation pin and then compressed the fracture.  We then locked it into position according to the manufacturer's standards.  The targeting guide was then removed.  I then identified the distal  static locking screw.  I made a small incision on the lateral aspect of the distal femur and then drilled a hole and then placed a 40-mm screw after measuring.  Final x-rays of the fracture and the position of the nail were taken and they were all satisfactory in both the AP and lateral planes.  I then aspirated with an 18-gauge needle, approximately 140 mL of fluid from the knee.  Since this was not a frank hemarthrosis, I did elect to send it for Gram stain cultures, crystal, and cytology.  Wounds were then irrigated copiously with normal saline.  I then closed the deep fascia with interrupted #1 Vicryl sutures, and then 2-0 Vicryl sutures superficial, and then staples on the skin.  I cleaned the skin and then placed an  Ace wrap over the knee and then the Aquacel dressings over the femoral nail incision sites.  The patient was ultimately extubated, transferred to the PACU without incident.  At the end of the case, all needle and sponge counts were correct.  There were no adverse intraoperative events.     Dahlia Bailiff, MD     DDB/MEDQ  D:  10/09/2014  T:  10/10/2014  Job:  [id number]

## 2014-10-11 ENCOUNTER — Inpatient Hospital Stay (HOSPITAL_COMMUNITY): Payer: Managed Care, Other (non HMO)

## 2014-10-11 ENCOUNTER — Encounter (HOSPITAL_COMMUNITY): Payer: Self-pay | Admitting: Orthopedic Surgery

## 2014-10-11 LAB — CBC
HCT: 28.8 % — ABNORMAL LOW (ref 39.0–52.0)
HEMOGLOBIN: 9.6 g/dL — AB (ref 13.0–17.0)
MCH: 32.4 pg (ref 26.0–34.0)
MCHC: 33.3 g/dL (ref 30.0–36.0)
MCV: 97.3 fL (ref 78.0–100.0)
Platelets: 220 10*3/uL (ref 150–400)
RBC: 2.96 MIL/uL — AB (ref 4.22–5.81)
RDW: 12.6 % (ref 11.5–15.5)
WBC: 11.1 10*3/uL — AB (ref 4.0–10.5)

## 2014-10-11 LAB — BASIC METABOLIC PANEL
Anion gap: 11 (ref 5–15)
BUN: 8 mg/dL (ref 6–23)
CHLORIDE: 95 meq/L — AB (ref 96–112)
CO2: 31 mEq/L (ref 19–32)
Calcium: 9.4 mg/dL (ref 8.4–10.5)
Creatinine, Ser: 0.66 mg/dL (ref 0.50–1.35)
GFR calc Af Amer: >90 mL/min (ref >90)
GLUCOSE: 127 mg/dL — AB (ref 70–99)
POTASSIUM: 3.8 meq/L (ref 3.7–5.3)
SODIUM: 137 meq/L (ref 137–147)

## 2014-10-11 LAB — GLUCOSE, CAPILLARY
GLUCOSE-CAPILLARY: 144 mg/dL — AB (ref 70–99)
GLUCOSE-CAPILLARY: 108 mg/dL — AB (ref 70–99)
GLUCOSE-CAPILLARY: 128 mg/dL — AB (ref 70–99)
Glucose-Capillary: 159 mg/dL — ABNORMAL HIGH (ref 70–99)

## 2014-10-11 NOTE — Progress Notes (Signed)
Utilization review completed.  

## 2014-10-11 NOTE — Progress Notes (Signed)
Patient ID: Phillip [name], male   DOB: January 05, 1966, 48 y.o.   MRN: [id number]  Portable pelvis xray reviewed.  Unchanged from one day postop.  Stable appearance.

## 2014-10-11 NOTE — Progress Notes (Signed)
Physical Therapy Treatment Patient Details Name: DAMONEY JULIA MRN: [id number] DOB: 19-Oct-1966 Today's Date: 10/11/2014    History of Present Illness R hip fx with IM nail and R knee arthrosis s/p aspiration    PT Comments    Patient was able to increase gait tolerance this tx and complete stair training.  Patient tolerated tx well with only min increase in pain.  Pt able to perform TE by AROM and only min A with all mobility.  Patient remains unsteady and shaky during gait.  He stated that he was nervous about walking.  Patient required 75% VCs for RW management, sequencing for gait and stairs.    Follow Up Recommendations  Home health PT     Equipment Recommendations  Rolling walker with 5" wheels    Recommendations for Other Services       Precautions / Restrictions Precautions Precautions: Fall Restrictions Weight Bearing Restrictions: Yes RLE Weight Bearing: Partial weight bearing RLE Partial Weight Bearing Percentage or Pounds: 50%    Mobility  Bed Mobility Overal bed mobility: Needs Assistance Bed Mobility: Supine to Sit     Supine to sit: Min assist     General bed mobility comments: Pt required A to get R LE to EOB  Transfers Overall transfer level: Needs assistance Equipment used: Rolling walker (2 wheeled) Transfers: Sit to/from Stand Sit to Stand: Min assist         General transfer comment: VCs for hand/foot placement and sequencing  Ambulation/Gait Ambulation/Gait assistance: Min assist Ambulation Distance (Feet): 60 Feet Assistive device: Rolling walker (2 wheeled) Gait Pattern/deviations: Step-to pattern;Decreased stance time - right;Leaning posteriorly Gait velocity: decr   General Gait Details: Pt required VCs and tactile cues for RW management, sequencing, PWB, UE support.  Pt requires increased time.  Pt is shaky and unsteady during gait with frequent post lean, he stated he was nervous.     Stairs Stairs: Yes Stairs assistance:  Min assist up backward/down forward due to [facility] Stair Management: No rails Number of Stairs: 1 (2x) General stair comments: VC's for RW management and sequencing  Wheelchair Mobility    Modified Rankin (Stroke Patients Only)       Balance                                    Cognition Arousal/Alertness: Awake/alert Behavior During Therapy: WFL for tasks assessed/performed Overall Cognitive Status: Within Functional Limits for tasks assessed                      Exercises General Exercises - Lower Extremity Ankle Circles/Pumps: AROM;10 reps;Supine Quad Sets: AROM;5 reps;Right;Supine Short Arc Quad: AROM;Right;10 reps;Supine Heel Slides: AROM;Right;10 reps;Supine Hip ABduction/ADduction: AROM;Right;10 reps;Supine Hip Flexion/Marching: AROM;Both;10 reps;Seated    General Comments        Pertinent Vitals/Pain Pain Assessment: 0-10 Pain Score: 7  Pain Location: R hip Pain Intervention(s): Monitored during session;Repositioned;Ice applied    Home Living                      Prior Function            PT Goals (current goals can now be found in the care plan section) Acute Rehab PT Goals Patient Stated Goal: HOME Progress towards PT goals: Progressing toward goals    Frequency  7X/week    PT Plan Current plan remains appropriate    Co-evaluation  End of Session Equipment Utilized During Treatment: Gait belt Activity Tolerance: Patient tolerated treatment well Patient left: in chair;with call bell/phone within reach     Time: 1045-1120 PT Time Calculation (min): 35 min  Charges:                       G Codes:      Miller,Derrick, SPtA 10/11/2014, 11:44 AM   reviwed above  [name]  PTA [facility]  Acute  Rehab Pager      [phone]

## 2014-10-11 NOTE — Progress Notes (Signed)
Subjective: Pain controlled.  Patient fell out of chair this morning trying to get cell phone.     Objective: Vital signs in last 24 hours: Temp:  [97.5 F (36.4 C)-98.8 F (37.1 C)] 98.2 F (36.8 C) (11/02 1135) Pulse Rate:  [83-102] 99 (11/02 1135) Resp:  [16-18] 18 (11/02 1135) BP: (132-154)/(80-108) 150/108 mmHg (11/02 1135) SpO2:  [97 %-100 %] 98 % (11/02 0623)  Intake/Output from previous day: 11/01 0701 - 11/02 0700 In: 3240 [P.O.:2640; I.V.:500; IV Piggyback:100] Out: 2450 [Urine:2450] Intake/Output this shift: Total I/O In: 420 [P.O.:420] Out: -    Recent Labs  10/09/14 1702 10/10/14 0532 10/11/14 0524  HGB 14.8 11.3* 9.6*    Recent Labs  10/10/14 0532 10/11/14 0524  WBC 8.0 11.1*  RBC 3.47* 2.96*  HCT 33.3* 28.8*  PLT 271 220    Recent Labs  10/10/14 0532 10/11/14 0524  NA 135* 137  K 4.6 3.8  CL 96 95*  CO2 28 31  BUN 6 8  CREATININE 0.62 0.66  GLUCOSE 194* 127*  CALCIUM 9.1 9.4   No results for input(s): LABPT, INR in the last 72 hours. ' Exam:  Wound look good.  No drainage or signs of infection.  Calves nontender, NVI.    Assessment/Plan: Will get right hip xray now due to fall.  D/c planning per medical service.     Elva Breaker M 10/11/2014, 12:17 PM

## 2014-10-11 NOTE — Progress Notes (Signed)
Physical Therapy Treatment Patient Details Name: Phillip [name] MRN: [id number] DOB: [birth year] Today's Date: 10/11/2014    History of Present Illness R hip fx with IM nail and R knee arthrosis s/p aspiration    PT Comments    Patient improved activity/gait tolerance this session compared to last as evidence of decreased shakiness and unsteadiness.  Pt is min A or less with all mobility including stairs but does still require 25-50% VCs for RW management and sequencing.    Follow Up Recommendations  Home health PT     Equipment Recommendations  Rolling walker with 5" wheels    Recommendations for Other Services       Precautions / Restrictions Precautions Precautions: Fall Restrictions Weight Bearing Restrictions: Yes RLE Weight Bearing: Partial weight bearing RLE Partial Weight Bearing Percentage or Pounds: 50%    Mobility  Bed Mobility Overal bed mobility: Needs Assistance Bed Mobility: Supine to Sit;Sit to Supine     Supine to sit: Supervision Sit to supine: Min assist   General bed mobility comments: Pt had difficulty getting R LE up onto bed;;VC's for LLE A  Transfers Overall transfer level: Modified independent Equipment used: Rolling walker (2 wheeled) Transfers: Sit to/from Stand Sit to Stand: Modified independent (Device/Increase time)         General transfer comment: VCs for hand/foot placement and sequencing  Ambulation/Gait Ambulation/Gait assistance: Min assist Ambulation Distance (Feet): 60 Feet Assistive device: Rolling walker (2 wheeled) Gait Pattern/deviations: Step-to pattern;Decreased stance time - right Gait velocity: decr   General Gait Details: Pt required less cueing compared to last tx, only 25%; VC's for sequencing and RW management   Stairs Stairs: Yes Stairs assistance: Min assist Stair Management: No rails;Backwards;With walker Number of Stairs: 1 General stair comments: VCs for sequencing and RW management; pt had  50% retention for technique from last tx  Wheelchair Mobility    Modified Rankin (Stroke Patients Only)       Balance                                    Cognition Arousal/Alertness: Awake/alert Behavior During Therapy: WFL for tasks assessed/performed Overall Cognitive Status: Within Functional Limits for tasks assessed                      Exercises Total Joint Exercises Standing Hip Extension: AROM;Right;10 reps;Standing General Exercises - Lower Extremity Hip ABduction/ADduction: AROM;Right;10 reps;Standing Hip Flexion/Marching: AROM;10 reps;Right;Standing    General Comments        Pertinent Vitals/Pain Pain Assessment: 0-10 Pain Score: 6  Pain Location: R hip Pain Descriptors / Indicators: Sore Pain Intervention(s): Monitored during session;Ice applied    Home Living Family/patient expects to be discharged to:: Private residence Living Arrangements: Alone Available Help at Discharge: Family Type of Home: Apartment Home Access: Stairs to enter   Home Layout: One level Home Equipment: None Additional Comments: Pt states has son (currently caring for dog that caused fall)    Prior Function Level of Independence: Independent      Comments: Walked 5-6 miles a day   PT Goals (current goals can now be found in the care plan section) Acute Rehab PT Goals Patient Stated Goal: HOME Progress towards PT goals: Progressing toward goals    Frequency  7X/week    PT Plan Current plan remains appropriate    Co-evaluation  End of Session Equipment Utilized During Treatment: Gait belt Activity Tolerance: Patient tolerated treatment well [name];with call bell/phone within reach;with chair alarm set     Time: 1416-1439 PT Time Calculation (min): 23 min  Charges:  $Gait Training: 8-22 mins $Therapeutic Exercise: 8-22 mins                     G Codes:      Miller,Derrick, SPTA 10/11/2014, 3:40 PM    [name]  PTA [facility]  Acute  Rehab Pager      [phone]

## 2014-10-11 NOTE — Evaluation (Signed)
Occupational Therapy Evaluation Patient Details Name: Phillip [name] MRN: [id number] DOB: [birth year] Today's Date: 10/11/2014    History of Present Illness R hip fx with IM nail and R knee arthrosis s/p aspiration   Clinical Impression   Pt is s/p hip FX resulting in the deficits listed below (see OT Problem List).  Pt will benefit from skilled OT to increase their safety and independence with ADL and functional mobility for ADL to facilitate discharge to venue listed below.        Follow Up Recommendations  Home health OT    Equipment Recommendations  Other (comment) (HH  will help pt decide tub DME)       Precautions / Restrictions Precautions Precautions: Fall Restrictions Weight Bearing Restrictions: Yes RLE Weight Bearing: Partial weight bearing RLE Partial Weight Bearing Percentage or Pounds: 50%      Mobility Bed Mobility Overal bed mobility: Needs Assistance Bed Mobility: Supine to Sit     Supine to sit: Supervision     General bed mobility comments: Pt required A to get R LE to EOB  Transfers Overall transfer level: Needs assistance Equipment used: Rolling walker (2 wheeled) Transfers: Sit to/from Stand Sit to Stand: Supervision         General transfer comment: VCs for hand/foot placement and sequencing    Balance                                            ADL Overall ADL's : Needs assistance/impaired                 Upper Body Dressing : Set up;Sitting   Lower Body Dressing: Sit to/from stand;Minimal assistance   Toilet Transfer: Minimal assistance;RW   Toileting- Clothing Manipulation and Hygiene: Minimal assistance;Sit to/from stand         General ADL Comments: verbal cues for safety. Pt had flip flops. educated pt this wouldbe a fall risk and to wear non skid socks or shoes.               Pertinent Vitals/Pain Pain Assessment: 0-10 Pain Score: 4  Pain Location: r hip Pain Descriptors /  Indicators: Sore Pain Intervention(s): Monitored during session;Ice applied        Extremity/Trunk Assessment Upper Extremity Assessment Upper Extremity Assessment: Overall WFL for tasks assessed           Communication Communication Communication: No difficulties   Cognition Arousal/Alertness: Awake/alert Behavior During Therapy: WFL for tasks assessed/performed Overall Cognitive Status: Within Functional Limits for tasks assessed                     General Comments   Pt VERY hard to understand. Ask pt several times to repeat self and speak up            [place] expects to be discharged to:: Private residence Living Arrangements: Alone Available Help at Discharge: Family Type of Home: Apartment Home Access: Stairs to enter Technical brewer of Steps: 1   Home Layout: One level     Bathroom Shower/Tub: Tub/shower unit (discussed PWB with tub) Shower/tub characteristics: Door       Home Equipment: None   Additional Comments: Pt states has son (currently caring for dog that caused fall)      Prior Functioning/Environment Level of Independence: Independent  Comments: Walked 5-6 miles a day    OT Diagnosis: Generalized weakness   OT Problem List: Decreased strength;Pain   OT Treatment/Interventions: Self-care/ADL training;Patient/family education;DME and/or AE instruction    OT Goals(Current goals can be found in the care plan section) Acute Rehab OT Goals Patient Stated Goal: HOME OT Goal Formulation: With patient Time For Goal Achievement: 10/25/14 Potential to Achieve Goals: Good ADL Goals Pt Will Perform Lower Body Dressing: with modified independence;sit to/from stand Pt Will Transfer to Toilet: with modified independence;regular height toilet;ambulating Pt Will Perform Toileting - Clothing Manipulation and hygiene: with modified independence;sit to/from stand  OT Frequency: Min 2X/week               End of Session Equipment Utilized During Treatment: Rolling walker Nurse Communication: Mobility status  Activity Tolerance: Patient tolerated treatment well [name];with bed alarm set;with nursing/sitter in room   Time: 8841-6606 OT Time Calculation (min): 17 min Charges:  OT General Charges $OT Visit: 1 Procedure OT Evaluation $Initial OT Evaluation Tier I: 1 Procedure OT Treatments $Self Care/Home Management : 8-22 mins G-Codes:    Payton Mccallum D 11-06-2014, 1:11 PM

## 2014-10-11 NOTE — Progress Notes (Signed)
I was called to pt.'s room by NT Tamika Brothers at 11:35 AM. She  heard a "thud" & found  Mr. Eguia sitting on the floor beside his bed. Pt had been up in the easy chair beside his bed where P.T. Had him sit after completing his morning session of therapy. Temp 98.2 Bp 150/108 p99  R=18. Pt. Was assisted back to bed & bed alarm was on.Pt. was reminded to call for assist to get up,& call bell is in reach.

## 2014-10-11 NOTE — Progress Notes (Signed)
PROGRESS NOTE  Phillip [name] EQA:[id number] DOB: 1966-05-24 DOA: 10/09/2014 PCP: No PCP Per Patient   Brief history 48 year old male with a history of alcohol dependence and alcohol withdrawal seizure suffered a mechanical fall when his dog dragged him to the ground on 10/09/2014. The patient had right hip pain. He presented to the emergency department and x-rays revealed a right intertrochanteric femur fracture. Orthopedics, Dr. Rolena Infante was consulted. The patient underwent ORIF on 10/09/2014. Because of the patient's history of seizure consultation was obtained. The patient had an alcohol withdrawal seizure on 09/06/2014. He has never had any prior seizures, nor has he had any seizures since that period of time. During that time, the patient was transferred to decrease his alcohol intake.  Assessment/Plan: History of alcohol withdraw seizure -Discontinue tramadol as this decreases seizure threshold -No indication for antiepileptic treatment at this time -monitor for seizure activity Alcohol dependence -The patient drinks 2 x 40 oz beers daily in addition to a "small bottle" of liquor daily -Alcohol withdrawal protocol--still requiring ativan -last drink was 10/09/2014 at noon Right intertrochanteric femur fracture -Status post ORIF--Dr. Rolena Infante -PT/OT-->home health PT/OT -Pain control -DVT prophylaxis per ortho -10/11/2014--patient had a mechanical fall trying to reach for a phone--repeat x-rays negative for fracture or dislocation Tobacco abuse -Tobacco cessation discussed Hyperglycemia -NovoLog sliding scale -Hemoglobin A1c--5.5   Procedures/Studies: Dg Hip Complete Right  10/10/2014   CLINICAL DATA:  Right hip fracture.  Subsequent encounter.  EXAM: RIGHT HIP - COMPLETE 2+ VIEW; DG C-ARM 1-60 MIN - NRPT [facility]  COMPARISON:  10/09/2014  FINDINGS: Multiple intraoperative spot films show placement of a sliding screw and intramedullary rod transfixing the  intertrochanteric right hip fracture in anatomic alignment. Distal fixation screw is seen within the intramedullary rod in the distal femur. No other fractures identified.  IMPRESSION: Internal fixation of right hip fracture in anatomic alignment.   Electronically Signed   By: Earle Gell M.D.   On: 10/10/2014 10:49   Dg Hip Complete Right  10/09/2014   CLINICAL DATA:  Patient fell 1 day prior while walking dog  EXAM: RIGHT HIP - COMPLETE 2+ VIEW  COMPARISON:  None.  FINDINGS: Frontal pelvis as well as frontal and lateral right hip images were obtained. There is a comminuted intertrochanteric femur fracture on the right with varus angulation at the fracture site. There is fragmentation of the greater trochanter on the right. No other fractures. No dislocation. Joint spaces appear intact.  IMPRESSION: Comminuted intertrochanteric femur fracture on the right with varus angulation at the fracture site.   Electronically Signed   By: Lowella Grip M.D.   On: 10/09/2014 16:13   Dg Femur Right  10/09/2014   CLINICAL DATA:  [place] while walking dog with right femoral pain  EXAM: RIGHT FEMUR - 2 VIEW  COMPARISON:  None.  FINDINGS: There is an intratrochanteric fracture of the proximal right femur. Some impaction angulation at the fracture site is noted. Large right knee joint effusion is noted. No other focal abnormality is noted.  IMPRESSION: Proximal right femoral fracture.  Large joint effusion in the right knee joint   Electronically Signed   By: Inez Catalina M.D.   On: 10/09/2014 18:20   Dg Pelvis Portable  10/11/2014   CLINICAL DATA:  Patient fell out of chair.  Right hip pain  EXAM: PORTABLE PELVIS 1-2 VIEWS  COMPARISON:  October 10, 2014  FINDINGS: There is postoperative change in the right proximal  femur region. There is rod and screw fixation through an intertrochanteric femur fracture. Alignment is near anatomic in this area and stable compared to 1 day prior. The fracture extends into the greater  trochanter on the right, stable. No new fracture. No dislocation. There is slight symmetric narrowing of both hip joints. Skin staples are noted on the right.  IMPRESSION: No change from 1 day prior. Postoperative change right proximal femur. No new fracture or dislocation. Slight symmetric narrowing of both hip joints.   Electronically Signed   By: Lowella Grip M.D.   On: 10/11/2014 13:21   Pelvis Portable  10/10/2014   CLINICAL DATA:  Status post ORIF of right hip fracture.  EXAM: PORTABLE PELVIS 1-2 VIEWS  COMPARISON:  10/09/2014  FINDINGS: The patient is status open reduction and internal fixation of the intertrochanteric fracture of the proximal right femur. There is an intra medullary rod earlier rod and screw device in place. The hardware components and fracture fragments are in anatomic alignment. Gas is identified within the soft tissues around the proximal right femur.  IMPRESSION: 1. Status post ORIF of proximal right femur fracture.   Electronically Signed   By: Kerby Moors M.D.   On: 10/10/2014 04:18   Dg Chest Port 1 View  10/09/2014   CLINICAL DATA:  Preoperative for hip fracture.  EXAM: PORTABLE CHEST - 1 VIEW  COMPARISON:  Apr 18, 2008  FINDINGS: Lungs are clear. Heart size and pulmonary vascularity are normal. No adenopathy. No pneumothorax. No bone lesions.  IMPRESSION: No edema or consolidation.   Electronically Signed   By: Lowella Grip M.D.   On: 10/09/2014 20:06   Dg C-arm 1-60 Min-no Report  10/10/2014   CLINICAL DATA:  Right hip fracture.  Subsequent encounter.  EXAM: RIGHT HIP - COMPLETE 2+ VIEW; DG C-ARM 1-60 MIN - [facility]PT [facility]  COMPARISON:  10/09/2014  FINDINGS: Multiple intraoperative spot films show placement of a sliding screw and intramedullary rod transfixing the intertrochanteric right hip fracture in anatomic alignment. Distal fixation screw is seen within the intramedullary rod in the distal femur. No other fractures identified.  IMPRESSION: Internal fixation  of right hip fracture in anatomic alignment.   Electronically Signed   By: Earle Gell M.D.   On: 10/10/2014 10:49         Subjective: Patient denies fevers, chills, headache, chest pain, dyspnea, nausea, vomiting, diarrhea, abdominal pain, dysuria, hematuria   Objective: Filed Vitals:   10/11/14 0000 10/11/14 0623 10/11/14 1135 10/11/14 1500  BP:  149/96 150/108 157/97  Pulse:  101 99 91  Temp:  97.6 F (36.4 C) 98.2 F (36.8 C) 98.2 F (36.8 C)  TempSrc:  Oral Oral Oral  Resp: 18 18 18 18   Height:      Weight:      SpO2:  98%  100%    Intake/Output Summary (Last 24 hours) at 10/11/14 1637 Last data filed at 10/11/14 1332  Gross per 24 hour  Intake   2260 ml  Output   1850 ml  Net    410 ml   Weight change:  Exam:   General:  Pt is alert, follows commands appropriately, not in acute distress  HEENT: No icterus, No thrush,[place]/AT  Cardiovascular: RRR, S1/S2, no rubs, no gallops  Respiratory: CTA bilaterally, no wheezing, no crackles, no rhonchi  Abdomen: Soft/+BS, non tender, non distended, no guarding  Extremities: No edema, No lymphangitis, No petechiae, No rashes, no synovitis  Data Reviewed: Basic Metabolic Panel:  Recent Labs Lab 10/09/14 1702 10/10/14 0532 10/11/14 0524  NA 139 135* 137  K 3.7 4.6 3.8  CL 98 96 95*  CO2 24 28 31   GLUCOSE 92 194* 127*  BUN 4* 6 8  CREATININE 0.61 0.62 0.66  CALCIUM 9.6 9.1 9.4   Liver Function Tests:  Recent Labs Lab 10/09/14 1702 10/10/14 0532  AST 41* 34  ALT 37 28  ALKPHOS 95 79  BILITOT 0.7 0.7  PROT 8.9* 7.2  ALBUMIN 4.3 3.6   No results for input(s): LIPASE, AMYLASE in the last 168 hours. No results for input(s): AMMONIA in the last 168 hours. CBC:  Recent Labs Lab 10/09/14 1702 10/10/14 0532 10/11/14 0524  WBC 6.5 8.0 11.1*  NEUTROABS 4.2 7.3  --   HGB 14.8 11.3* 9.6*  HCT 42.9 33.3* 28.8*  MCV 95.1 96.0 97.3  PLT 299 271 220   Cardiac Enzymes: No results for input(s):  CKTOTAL, CKMB, CKMBINDEX, TROPONINI in the last 168 hours. BNP: Invalid input(s): POCBNP CBG:  Recent Labs Lab 10/10/14 1309 10/10/14 1546 10/10/14 2229 10/11/14 0818 10/11/14 1300  GLUCAP 203* 139* 124* 144* 108*    No results found for this or any previous visit (from the past 240 hour(s)).   Scheduled Meds: . docusate sodium  100 mg Oral BID  . enoxaparin (LOVENOX) injection  40 mg Subcutaneous Q24H  . ferrous sulfate  325 mg Oral TID PC  . folic acid  1 mg Oral Daily  . insulin aspart  0-9 Units Subcutaneous TID WC  . LORazepam  0-4 mg Intravenous Q6H   Followed by  . [START ON 10/12/2014] LORazepam  0-4 mg Intravenous Q12H  . multivitamin with minerals  1 tablet Oral Daily  . thiamine  100 mg Oral Daily   Or  . thiamine  100 mg Intravenous Daily   Continuous Infusions: . lactated ringers 85 mL/hr at 10/10/14 1803     Rosalynd Mcwright, DO  [facility] Pager [phone]  If 7PM-7AM, please contact night-coverage [email or web address] Password [facility] 10/11/2014, 4:37 PM   LOS: 2 days

## 2014-10-12 LAB — CBC
HCT: 26.8 % — ABNORMAL LOW (ref 39.0–52.0)
Hemoglobin: 8.9 g/dL — ABNORMAL LOW (ref 13.0–17.0)
MCH: 32.5 pg (ref 26.0–34.0)
MCHC: 33.2 g/dL (ref 30.0–36.0)
MCV: 97.8 fL (ref 78.0–100.0)
PLATELETS: 210 10*3/uL (ref 150–400)
RBC: 2.74 MIL/uL — AB (ref 4.22–5.81)
RDW: 12.6 % (ref 11.5–15.5)
WBC: 8 10*3/uL (ref 4.0–10.5)

## 2014-10-12 LAB — GRAM STAIN: Gram Stain: NONE SEEN

## 2014-10-12 LAB — GLUCOSE, CAPILLARY
GLUCOSE-CAPILLARY: 117 mg/dL — AB (ref 70–99)
GLUCOSE-CAPILLARY: 84 mg/dL (ref 70–99)

## 2014-10-12 MED ORDER — ASPIRIN EC 81 MG PO TBEC: 81.0000 mg | DELAYED_RELEASE_TABLET | Freq: Every day | ORAL | Status: DC

## 2014-10-12 MED ORDER — METHOCARBAMOL 500 MG PO TABS: 500.0000 mg | ORAL_TABLET | Freq: Three times a day (TID) | ORAL | Status: DC | PRN

## 2014-10-12 MED ORDER — ASPIRIN EC 325 MG PO TBEC: 325.0000 mg | DELAYED_RELEASE_TABLET | Freq: Every day | ORAL | Status: DC

## 2014-10-12 MED ORDER — HYDROCODONE-ACETAMINOPHEN 5-325 MG PO TABS: 1.0000 | ORAL_TABLET | Freq: Four times a day (QID) | ORAL | Status: DC | PRN

## 2014-10-12 MED ORDER — ENOXAPARIN SODIUM 40 MG/0.4ML ~~LOC~~ SOLN: 40.0000 mg | SUBCUTANEOUS | Status: DC

## 2014-10-12 NOTE — Progress Notes (Signed)
Patient ID: Phillip [name], male   DOB: April 01, 1966, 48 y.o.   MRN: [id number]  Amy charge nurse on [place] called stating that patient had issues with Lovenox that was prescribed for postop dvt prophylaxis.  Stated that he didn't want to inject himself and could not afford the cost.  I spoke with Phillip [name] and advised him of the risk of inadequate postop dvt prophylaxis which could lead to PE and death.  He states that he understands and will use aspirin 81 mg po daily.  Situation discussed with my attending Dr Melina Schools.

## 2014-10-12 NOTE — Progress Notes (Signed)
PROGRESS NOTE  Phillip [name] DUK:[id number] DOB: [birth year] DOA: 10/09/2014 PCP: No PCP Per Patient  Brief history 48 year old male with a history of alcohol dependence and alcohol withdrawal seizure suffered a mechanical fall when his dog dragged him to the ground on 10/09/2014. The patient had right hip pain. He presented to the emergency department and x-rays revealed a right intertrochanteric femur fracture. Orthopedics, Dr. Rolena Infante was consulted. The patient underwent ORIF on 10/09/2014. Because of the patient's history of seizure consultation was obtained. The patient had an alcohol withdrawal seizure on 09/06/2014. He has never had any prior seizures, nor has he had any seizures since that period of time. During that time, the patient was transferred to decrease his alcohol intake.  Assessment/Plan: History of alcohol withdraw seizure -Discontinue tramadol as this decreases seizure threshold -No indication for antiepileptic treatment at this time -monitor for seizure activity -no seizure activity during hospitalization Alcohol dependence -The patient drinks 2 x 40 oz beers daily in addition to a "small bottle" of liquor daily -Alcohol withdrawal protocol--CIWA score overall decreasing -last drink was 10/09/2014 at noon -Etoh cessation discussed, CSW spoke with pt -pt adamant about going home today Right intertrochanteric femur fracture -Status post ORIF--Dr. Rolena Infante -PT/OT-->home health PT/OT -Pain control -DVT prophylaxis per ortho -10/11/2014--patient had a mechanical fall trying to reach for a phone--repeat x-rays negative for fracture or dislocation Tobacco abuse -Tobacco cessation discussed Hyperglycemia -NovoLog sliding scale -Hemoglobin A1c--5.5 -outpt followup, no present indication to start any agents  As the patient is adamant about d/c and he presently does not have active s/s of withdraw, he is likely stable for d/c.      Procedures/Studies: Dg  Hip Complete Right  Nov 03, 2014   CLINICAL DATA:  Right hip fracture.  Subsequent encounter.  EXAM: RIGHT HIP - COMPLETE 2+ VIEW; DG C-ARM 1-60 MIN - NRPT [facility]  COMPARISON:  10/09/2014  FINDINGS: Multiple intraoperative spot films show placement of a sliding screw and intramedullary rod transfixing the intertrochanteric right hip fracture in anatomic alignment. Distal fixation screw is seen within the intramedullary rod in the distal femur. No other fractures identified.  IMPRESSION: Internal fixation of right hip fracture in anatomic alignment.   Electronically Signed   By: Earle Gell M.D.   On: 11/03/2014 10:49   Dg Hip Complete Right  10/09/2014   CLINICAL DATA:  Patient fell 1 day prior while walking dog  EXAM: RIGHT HIP - COMPLETE 2+ VIEW  COMPARISON:  None.  FINDINGS: Frontal pelvis as well as frontal and lateral right hip images were obtained. There is a comminuted intertrochanteric femur fracture on the right with varus angulation at the fracture site. There is fragmentation of the greater trochanter on the right. No other fractures. No dislocation. Joint spaces appear intact.  IMPRESSION: Comminuted intertrochanteric femur fracture on the right with varus angulation at the fracture site.   Electronically Signed   By: Lowella Grip M.D.   On: 10/09/2014 16:13   Dg Femur Right  10/09/2014   CLINICAL DATA:  [place] while walking dog with right femoral pain  EXAM: RIGHT FEMUR - 2 VIEW  COMPARISON:  None.  FINDINGS: There is an intratrochanteric fracture of the proximal right femur. Some impaction angulation at the fracture site is noted. Large right knee joint effusion is noted. No other focal abnormality is noted.  IMPRESSION: Proximal right femoral fracture.  Large joint effusion in the right knee joint   Electronically Signed  By: Inez Catalina M.D.   On: 10/09/2014 18:20   Dg Pelvis Portable  10/11/2014   CLINICAL DATA:  Patient fell out of chair.  Right hip pain  EXAM: PORTABLE PELVIS 1-2  VIEWS  COMPARISON:  October 10, 2014  FINDINGS: There is postoperative change in the right proximal femur region. There is rod and screw fixation through an intertrochanteric femur fracture. Alignment is near anatomic in this area and stable compared to 1 day prior. The fracture extends into the greater trochanter on the right, stable. No new fracture. No dislocation. There is slight symmetric narrowing of both hip joints. Skin staples are noted on the right.  IMPRESSION: No change from 1 day prior. Postoperative change right proximal femur. No new fracture or dislocation. Slight symmetric narrowing of both hip joints.   Electronically Signed   By: Lowella Grip M.D.   On: 10/11/2014 13:21   Pelvis Portable  10/10/2014   CLINICAL DATA:  Status post ORIF of right hip fracture.  EXAM: PORTABLE PELVIS 1-2 VIEWS  COMPARISON:  10/09/2014  FINDINGS: The patient is status open reduction and internal fixation of the intertrochanteric fracture of the proximal right femur. There is an intra medullary rod earlier rod and screw device in place. The hardware components and fracture fragments are in anatomic alignment. Gas is identified within the soft tissues around the proximal right femur.  IMPRESSION: 1. Status post ORIF of proximal right femur fracture.   Electronically Signed   By: Kerby Moors M.D.   On: 10/10/2014 04:18   Dg Chest Port 1 View  10/09/2014   CLINICAL DATA:  Preoperative for hip fracture.  EXAM: PORTABLE CHEST - 1 VIEW  COMPARISON:  Apr 18, 2008  FINDINGS: Lungs are clear. Heart size and pulmonary vascularity are normal. No adenopathy. No pneumothorax. No bone lesions.  IMPRESSION: No edema or consolidation.   Electronically Signed   By: Lowella Grip M.D.   On: 10/09/2014 20:06   Dg C-arm 1-60 Min-no Report  10/10/2014   CLINICAL DATA:  Right hip fracture.  Subsequent encounter.  EXAM: RIGHT HIP - COMPLETE 2+ VIEW; DG C-ARM 1-60 MIN - NRPT [facility]  COMPARISON:  10/09/2014  FINDINGS:  Multiple intraoperative spot films show placement of a sliding screw and intramedullary rod transfixing the intertrochanteric right hip fracture in anatomic alignment. Distal fixation screw is seen within the intramedullary rod in the distal femur. No other fractures identified.  IMPRESSION: Internal fixation of right hip fracture in anatomic alignment.   Electronically Signed   By: Earle Gell M.D.   On: 10/10/2014 10:49         Subjective: Patient denies fevers, chills, headache, chest pain, dyspnea, nausea, vomiting, diarrhea, abdominal pain   Objective: Filed Vitals:   10/12/14 0000 10/12/14 0400 10/12/14 0502 10/12/14 1402  BP:   138/94 126/111  Pulse:   92 104  Temp:   97.6 F (36.4 C) 99 F (37.2 C)  TempSrc:   Oral Oral  Resp: 16 16 18 18   Height:      Weight:      SpO2: 97% 96% 97% 100%    Intake/Output Summary (Last 24 hours) at 10/12/14 1633 Last data filed at 10/12/14 1404  Gross per 24 hour  Intake   1940 ml  Output   1000 ml  Net    940 ml   Weight change:  Exam:   General:  Pt is alert, follows commands appropriately, not in acute distress  HEENT: No icterus,  No thrush,  [place]/AT  Cardiovascular: RRR, S1/S2, no rubs, no gallops  Respiratory: CTA bilaterally, no wheezing, no crackles, no rhonchi  Abdomen: Soft/+BS, non tender, non distended, no guarding    Data Reviewed: Basic Metabolic Panel:  Recent Labs Lab 10/09/14 1702 10/10/14 0532 10/11/14 0524  NA 139 135* 137  K 3.7 4.6 3.8  CL 98 96 95*  CO2 24 28 31   GLUCOSE 92 194* 127*  BUN 4* 6 8  CREATININE 0.61 0.62 0.66  CALCIUM 9.6 9.1 9.4   Liver Function Tests:  Recent Labs Lab 10/09/14 1702 10/10/14 0532  AST 41* 34  ALT 37 28  ALKPHOS 95 79  BILITOT 0.7 0.7  PROT 8.9* 7.2  ALBUMIN 4.3 3.6   No results for input(s): LIPASE, AMYLASE in the last 168 hours. No results for input(s): AMMONIA in the last 168 hours. CBC:  Recent Labs Lab 10/09/14 1702 10/10/14 0532  10/11/14 0524 10/12/14 0425  WBC 6.5 8.0 11.1* 8.0  NEUTROABS 4.2 7.3  --   --   HGB 14.8 11.3* 9.6* 8.9*  HCT 42.9 33.3* 28.8* 26.8*  MCV 95.1 96.0 97.3 97.8  PLT 299 271 220 210   Cardiac Enzymes: No results for input(s): CKTOTAL, CKMB, CKMBINDEX, TROPONINI in the last 168 hours. BNP: Invalid input(s): POCBNP CBG:  Recent Labs Lab 10/11/14 1300 10/11/14 1725 10/11/14 2218 10/12/14 0745 10/12/14 1210  GLUCAP 108* 128* 159* 117* 84    Recent Results (from the past 240 hour(s))  Gram stain     Status: None   Collection Time: 10/09/14 11:29 PM  Result Value Ref Range Status   Specimen Description SYNOVIAL R KNEE  Final   Special Requests NONE  Final   Gram Stain   Final    NO WBC SEEN NO ORGANISMS SEEN Performed at [facility]    Report Status 10/12/2014 FINAL  Final     Scheduled Meds: . docusate sodium  100 mg Oral BID  . enoxaparin (LOVENOX) injection  40 mg Subcutaneous Q24H  . ferrous sulfate  325 mg Oral TID PC  . folic acid  1 mg Oral Daily  . insulin aspart  0-9 Units Subcutaneous TID WC  . LORazepam  0-4 mg Intravenous Q12H  . multivitamin with minerals  1 tablet Oral Daily  . thiamine  100 mg Oral Daily   Or  . thiamine  100 mg Intravenous Daily   Continuous Infusions: . lactated ringers 85 mL/hr at 10/10/14 1803     Ladaisha Portillo, DO  [facility] Pager [phone]  If 7PM-7AM, please contact night-coverage [email or web address] Password [email or web address] 10/12/2014, 4:33 PM   LOS: 3 days

## 2014-10-12 NOTE — Progress Notes (Signed)
CSW consulted to assist with SA resources. CSW met briefly with pt last night. SA resources provided. CSW encouraged pt to review options and CSW will return 11/3 for further assistance. Complete psychosocial to follow.  Werner Lean LCSW [phone]

## 2014-10-12 NOTE — Progress Notes (Signed)
Physical Therapy Treatment Patient Details Name: Phillip [name] MRN: [id number] DOB: [birth year] Today's Date: 10/12/2014    History of Present Illness R hip fx with IM nail and R knee arthrosis s/p aspiration    PT Comments    Patient continues to progress and demo better control during gait and decreased shakiness but requires significant increased time.  Pt continues to require VCs for technique and stairs (one step).  Patient was able to increase gait tolerance this session with minimal increase in pain, 7/10, ice applied post tx.  Patient was A to bathroom and educated on car transfers and home safety.    Follow Up Recommendations  Home health PT     Equipment Recommendations  Rolling walker with 5" wheels    Recommendations for Other Services       Precautions / Restrictions Precautions Precautions: Fall Restrictions Weight Bearing Restrictions: Yes RLE Weight Bearing: Partial weight bearing RLE Partial Weight Bearing Percentage or Pounds: 50    Mobility  Bed Mobility Overal bed mobility: Modified Independent Bed Mobility: Supine to Sit     Supine to sit: Modified independent (Device/Increase time)        Transfers Overall transfer level: Modified independent Equipment used: Rolling walker (2 wheeled) Transfers: Sit to/from Stand Sit to Stand: Modified independent (Device/Increase time)            Ambulation/Gait Ambulation/Gait assistance: Min guard Ambulation Distance (Feet): 75 Feet Assistive device: Rolling walker (2 wheeled) Gait Pattern/deviations: Step-to pattern;Decreased stance time - right Gait velocity: decr   General Gait Details: Pt requires increased time for gait; Pt wore sandals this session in order to assess gait d/c shoes; pt had good control except for one minor LOB while turning around, pt easily caught himself   Stairs Stairs: Yes Stairs assistance: Min assist Stair Management: No rails;Backwards;With walker Number of  Stairs: 1 General stair comments: VCs for sequencing and RW management; pt continues to need cueing for technique  Wheelchair Mobility    Modified Rankin (Stroke Patients Only)       Balance                                    Cognition Arousal/Alertness: Awake/alert Behavior During Therapy: WFL for tasks assessed/performed Overall Cognitive Status: Within Functional Limits for tasks assessed                      Exercises      General Comments        Pertinent Vitals/Pain Pain Assessment: 0-10 Pain Score: 7  Pain Location: R hip Pain Descriptors / Indicators: Sore Pain Intervention(s): Monitored during session;Repositioned;Ice applied    Home Living                      Prior Function            PT Goals (current goals can now be found in the care plan section) Acute Rehab PT Goals Patient Stated Goal: HOME Progress towards PT goals: Progressing toward goals    Frequency  7X/week    PT Plan Current plan remains appropriate    Co-evaluation             End of Session Equipment Utilized During Treatment: Gait belt Activity Tolerance: Patient tolerated treatment well Patient left: with call bell/phone within reach;with chair alarm set;in chair;with nursing/sitter in room  Time: 7858-8502 PT Time Calculation (min): 25 min  Charges:                       G Codes:      Miller,Derrick, SPTA 10/12/2014, 12:24 PM   Reviewed above  [name]  PTA [facility]  Acute  Rehab Pager      [phone]

## 2014-10-12 NOTE — Progress Notes (Signed)
Clinical Social Work Department BRIEF PSYCHOSOCIAL ASSESSMENT 10/12/2014  Patient:  Phillip [name], Phillip [name]     Account Number:  [id number]     Admit date:  10/09/2014  Clinical Social Worker:  Lacie Scotts  Date/Time:  10/12/2014 03:06 PM  Referred by:  Physician  Date Referred:  10/11/2014 Referred for  Substance Abuse   Other Referral:   Interview type:  Patient Other interview type:    PSYCHOSOCIAL DATA Living Status:  ALONE Admitted from facility:   Level of care:   Primary support name:  Edwar Coe Primary support relationship to patient:  SIBLING Degree of support available:   unclear    CURRENT CONCERNS Current Concerns  Substance Abuse   Other Concerns:   No insurance    SOCIAL WORK ASSESSMENT / PLAN Pt is a 48 yr old gentleman living alone prior to hospitalization. Pt had fallen while walking his dog and fx his hip which  required surgery. PT has recommended HHPT following hospital d/c. CSW has been consulted for assistance with SA resources. SBIRT declined. Pt admits to drinking . "  I drink with my friends. That's what we all do." Pt denies attempts to stop drinking. He is willing to accept resource list but declined CSW assistance arranging services. Pt declined AA meeting schedule. He is anxious to d/c home.   Assessment/plan status:  Psychosocial Support/Ongoing Assessment of Needs Other assessment/ plan:   Information/referral to community resources:   SA resources  Requested assistance from [facility] due to no insurance    PATIENT'S/FAMILY'S RESPONSE TO PLAN OF CARE: Pt is anxious to return home. CSW has encouraged him to accept assistance with ETOH abuse. Safety issues reviewed with pt drinking following hip surgery . Pt states he understands and will stop drinking. Stated he appreciated assistance from [place] / [place].   Werner Lean LCSW [phone]

## 2014-10-12 NOTE — Progress Notes (Signed)
Patient ID: Phillip [name], male   DOB: Apr 20, 1966, 48 y.o.   MRN: [id number]   I spoke with Dr Rolena Infante after i saw patient earlier.  Stated that we can go ahead and discharge patient home today.  Scripts on chart.  F/u in office 2 weeks postop.

## 2014-10-12 NOTE — Progress Notes (Signed)
Occupational Therapy Treatment Patient Details Name: Phillip [name] MRN: [id number] DOB: 1965/12/30 Today's Date: 10/12/2014    History of present illness R hip fx with IM nail and R knee arthrosis s/p aspiration   OT comments  Educated on AE and issued to pt.  Will return for toilet transfer  Follow Up Recommendations  Home health OT    Equipment Recommendations   (HHOT to further assess for tub bench)    Recommendations for Other Services      Precautions / Restrictions Precautions Precautions: Fall Restrictions Weight Bearing Restrictions: Yes RLE Weight Bearing: Partial weight bearing RLE Partial Weight Bearing Percentage or Pounds: 50       Mobility Bed Mobility               Transfers Overall transfer level: Modified independent Equipment used: Rolling walker (2 wheeled) Transfers: Sit to/from Stand Sit to Stand: Modified independent (Device/Increase time)              Balance                                   ADL Overall ADL's : Needs assistance/impaired             Lower Body Bathing: Set Up;Sit to/from stand;With adaptive equipment       Lower Body Dressing: Minimal assistance;Sit to/from stand;With adaptive equipment:  Needs further practice with sock aide                 General ADL Comments: Pt has 7/10 pain.  Will return after pain medication for toilet transfer.  Pt struggles to don pants:  educated on reacher and sock aid.    son will be with pt most of the time.  He has medicaid potential:  Passenger transport manager   Behavior During Therapy: WFL for tasks assessed/performed Overall Cognitive Status: Within Functional Limits for tasks assessed                       Extremity/Trunk Assessment               Exercises     Shoulder Instructions       General Comments      Pertinent Vitals/  Pain       Pain Assessment: 0-10 Pain Score: 7  Pain Location: R hip Pain Descriptors / Indicators: Aching;Sore Pain Intervention(s): Limited activity within patient's tolerance;Monitored during session  Home Living                                          Prior Functioning/Environment              Frequency Min 2X/week     Progress Toward Goals  OT Goals(current goals can now be found in the care plan section)  Progress towards OT goals: Progressing toward goals  Acute Rehab OT Goals Patient Stated Goal: HOME  Plan Discharge plan remains appropriate    Co-evaluation                 End of Session  Activity Tolerance Patient tolerated treatment well   Patient Left in chair;with call bell/phone within reach;with chair alarm set   Nurse Communication          Time: 5361-4431 OT Time Calculation (min): 11 min  Charges: OT General Charges $OT Visit: 1 Procedure OT Treatments $Self Care/Home Management : 8-22 mins  Mickaela Starlin 10/12/2014, 2:03 PM  Lesle Chris, OTR/L [phone] 10/12/2014

## 2014-10-12 NOTE — Plan of Care (Signed)
Problem: Discharge Progression Outcomes Goal: Barriers To Progression Addressed/Resolved Outcome: Completed/Met Date Met:  10/12/14 Goal: CMS/Neurovascular status at or above baseline Outcome: Completed/Met Date Met:  10/12/14 Goal: Anticoagulant follow-up in place Outcome: Completed/Met Date Met:  10/12/14 Goal: Pain controlled with appropriate interventions Outcome: Completed/Met Date Met:  10/12/14 Goal: Hemodynamically stable Outcome: Completed/Met Date Met:  10/12/14 Goal: Complications resolved/controlled Outcome: Completed/Met Date Met:  10/12/14 Goal: Tolerates diet Outcome: Completed/Met Date Met:  10/12/14 Goal: Activity appropriate for discharge plan Outcome: Completed/Met Date Met:  10/12/14 Goal: Ambulates safely using assistive device Outcome: Completed/Met Date Met:  10/12/14 Goal: Follows weight - bearing limitations Outcome: Completed/Met Date Met:  10/12/14 Goal: Discharge plan in place and appropriate Outcome: Completed/Met Date Met:  10/12/14 Goal: Negotiates stairs Outcome: Completed/Met Date Met:  10/12/14 Goal: Demonstrates ADLs as appropriate Outcome: Completed/Met Date Met:  10/12/14 Goal: Incision without S/S infection Outcome: Completed/Met Date Met:  10/12/14 Goal: Other Discharge Outcomes/Goals Outcome: Not Applicable Date Met:  10/12/14     

## 2014-10-12 NOTE — Progress Notes (Signed)
Subjective: Hip pain controlled.  Good progress with PT.    Objective: Vital signs in last 24 hours: Temp:  [97.6 F (36.4 C)-98.2 F (36.8 C)] 97.6 F (36.4 C) (11/03 0502) Pulse Rate:  [91-95] 92 (11/03 0502) Resp:  [16-18] 18 (11/03 0502) BP: (132-157)/(90-97) 138/94 mmHg (11/03 0502) SpO2:  [96 %-100 %] 97 % (11/03 0502)  Intake/Output from previous day: 11/02 0701 - 11/03 0700 In: 2120 [P.O.:2120] Out: 600 [Urine:600] Intake/Output this shift: Total I/O In: -  Out: 600 [Urine:600]   Recent Labs  10/09/14 1702 10/10/14 0532 10/11/14 0524 10/12/14 0425  HGB 14.8 11.3* 9.6* 8.9*    Recent Labs  10/11/14 0524 10/12/14 0425  WBC 11.1* 8.0  RBC 2.96* 2.74*  HCT 28.8* 26.8*  PLT 220 210    Recent Labs  10/10/14 0532 10/11/14 0524  NA 135* 137  K 4.6 3.8  CL 96 95*  CO2 28 31  BUN 6 8  CREATININE 0.62 0.66  GLUCOSE 194* 127*  CALCIUM 9.1 9.4   No results for input(s): LABPT, INR in the last 72 hours.  Exam:  Hip wounds look good.  Staples intact.  No drainage or signs of infection.  Calves nontender, NVI.    Assessment/Plan: Ok to d/c from an ortho standpoint.  D/c planning per medical team.  Scripts on chart for lovenox, norco and robaxin.  Needs f/u with Dr Rolena Infante 2 weeks postop.     Phillip [name] M 10/12/2014, 12:18 PM

## 2014-10-12 NOTE — Care Management Note (Signed)
    Page 1 of 2   10/12/2014     2:20:47 PM CARE MANAGEMENT NOTE 10/12/2014  Patient:  Phillip [name], Phillip [name]   Account Number:  [id number]  Date Initiated:  10/12/2014  Documentation initiated by:  [facility]  Subjective/Objective Assessment:   adm: Open reduction and internal fixation with IM nail  of the right femur.  Nail utilized     Action/Plan:   discharge planning   Anticipated DC Date:  10/12/2014   Anticipated DC Plan:        [place]  CM consult  [facility]  Medication Assistance      Choice offered to / List presented to:     DME arranged  Vassie Moselle      DME agency  [place] arranged  [place].   Status of service:   Medicare Important Message given?   (If response is "NO", the following Medicare IM given date fields will be blank) Date Medicare IM given:   Medicare IM given by:   Date Additional Medicare IM given:   Additional Medicare IM given by:    Discharge Disposition:  [place]  Per UR Regulation:    If discussed at Long Length of Stay Meetings, dates discussed:    Comments:  10/12/14 14:00 CM spoke with financial counselor who states she has already spoken with pt.  Cm spoke with pt in room and gave pt [facility] pamphlet and pt verbalizes undersatanding he is to go to the clinic any weekday morning from 9-10am and ask for: AN APPOINTMENT FOR A PRIMARY CARE PHYSICIAN; AN APPOINTMENT WITH A NAVIGATOR TO PURSUE INSURNACE/MEDICAID; AN APPOINTMENT FOR FOLLOW UP MEDICAL CARE.  [facility] will provide rolling walker through charity and HHPT through charity.  Pt has no money for lovenox and will exceed the cap on MATCH; RN calling MD for a more affordable blood thinner.  No other CM needs were communicated.  Mariane Masters, BSN, CM [phone].

## 2014-10-12 NOTE — Progress Notes (Signed)
Occupational Therapy Treatment Patient Details Name: ABHI MOCCIA MRN: [id number] DOB: Mar 04, 1966 Today's Date: 10/12/2014    History of present illness R hip fx with IM nail and R knee arthrosis s/p aspiration   OT comments  Min A for steadiness and cueing needed for ambulating to bathroom.  Will benefit from continued OT at home.  Follow Up Recommendations  Home health OT    Equipment Recommendations   (HHOT to recommend tub DME; pt has grab bar by toilet)    Recommendations for Other Services      Precautions / Restrictions Precautions Precautions: Fall Restrictions Weight Bearing Restrictions: Yes RLE Weight Bearing: Partial weight bearing RLE Partial Weight Bearing Percentage or Pounds: 50       Mobility Bed Mobility               Transfers  Equipment used: Rolling walker (2 wheeled) Transfers: Sit to/from Stand Sit to Stand: Supervision         General transfer comment: cues for UE/LE placement    Balance                                   ADL Overall ADL's : Needs assistance/impaired                       Toilet Transfer: Minimal assistance;Ambulation;RW             General ADL Comments: ambulated to bathroom with RW.  Cues for sequencing and safety.  Pt is shaky/unsteady:  Worse for first few steps.  Cued pt to roll walker forward as he tended to pick it up.  Recommend min guard to min A at home.      Vision                     Perception     Praxis      Cognition   Behavior During Therapy: WFL for tasks assessed/performed Overall Cognitive Status: Within Functional Limits for tasks assessed                       Extremity/Trunk Assessment               Exercises     Shoulder Instructions       General Comments      Pertinent Vitals/ Pain       Pain Assessment: 0-10 Pain Score: 2  Pain Location: R hip Pain Descriptors / Indicators:  (stiff) Pain Intervention(s):  Limited activity within patient's tolerance;Monitored during session;Premedicated before session;Repositioned  Home Living                                          Prior Functioning/Environment              Frequency Min 2X/week     Progress Toward Goals  OT Goals(current goals can now be found in the care plan section)  Progress towards OT goals: Progressing toward goals  Acute Rehab OT Goals Patient Stated Goal: HOME  Plan Discharge plan remains appropriate    Co-evaluation                 End of Session     Activity Tolerance Patient tolerated treatment well   Patient Left in  chair;with call bell/phone within reach;with chair alarm set   Nurse Communication          Time: [phone] OT Time Calculation (min): 10 min  Charges: OT General Charges $OT Visit: 1 Procedure OT Treatments $Self Care/Home Management : 8-22 mins  Sherley Mckenney 10/12/2014, 3:38 PM Lesle Chris, OTR/L [phone] 10/12/2014

## 2014-10-13 NOTE — Addendum Note (Signed)
Addendum  created 10/13/14 1123 by Peyton Najjar, MD   Modules edited: Anesthesia Responsible Staff

## 2014-10-19 NOTE — Discharge Summary (Signed)
Patient ID: Phillip [name] MRN: [id number] DOB/AGE: [birth year] 46 y.o.  Admit date: 10/09/2014 Discharge date: 10/19/2014  Admission Diagnoses:  Principal Problem:   Fracture of hip, closed Active Problems:   Alcoholism /alcohol abuse   Convulsions   Hip fracture requiring operative repair   Hyperglycemia   Tobacco abuse   Discharge Diagnoses:  Principal Problem:   Fracture of hip, closed Active Problems:   Alcoholism /alcohol abuse   Convulsions   Hip fracture requiring operative repair   Hyperglycemia   Tobacco abuse  status post Procedure(s): INTRAMEDULLARY (IM) NAIL INTERTROCHANTRIC Right knee aspiration  Past Medical History  Diagnosis Date  . DJD (degenerative joint disease)     Surgeries: Procedure(s): INTRAMEDULLARY (IM) NAIL INTERTROCHANTRIC Right knee aspiration on 10/09/2014 - 10/10/2014   Consultants: Treatment Team:  Melina Schools, MD  Discharged Condition: Improved  Hospital Course: Phillip [name] is an 48 y.o. male who was admitted 10/09/2014 for operative treatment of Fracture of hip, closed. Patient failed conservative treatments (please see the history and physical for the specifics) and had severe unremitting pain that affects sleep, daily activities and work/hobbies. After pre-op clearance, the patient was taken to the operating room on 10/09/2014 - 10/10/2014 and underwent  Procedure(s): INTRAMEDULLARY (IM) NAIL INTERTROCHANTRIC Right knee aspiration.    Patient was given perioperative antibiotics:  Anti-infectives    Start     Dose/Rate Route Frequency Ordered Stop   10/10/14 0600  ceFAZolin (ANCEF) IVPB 2 g/50 mL premix     2 g100 mL/hr over 30 Minutes Intravenous Every 6 hours 10/10/14 0401 10/10/14 1220   10/09/14 2000  [MAR Hold]  ceFAZolin (ANCEF) IVPB 2 g/50 mL premix     (MAR Hold since 10/09/14 2150)   2 g100 mL/hr over 30 Minutes Intravenous 3 times per day 10/09/14 1935 10/09/14 2200       Patient was given  sequential compression devices and early ambulation to prevent DVT.   Patient benefited maximally from hospital stay and there were no complications. At the time of discharge, the patient was urinating/moving their bowels without difficulty, tolerating a regular diet, pain is controlled with oral pain medications and they have been cleared by PT/OT.   Recent vital signs: No data found.    Recent laboratory studies: No results for input(s): WBC, HGB, HCT, PLT, NA, K, CL, CO2, BUN, CREATININE, GLUCOSE, INR, CALCIUM in the last 72 hours.  Invalid input(s): PT, 2   Discharge Medications:     Medication List    STOP taking these medications        acetaminophen 500 MG tablet  Commonly known as:  TYLENOL     meloxicam 15 MG tablet  Commonly known as:  MOBIC     traMADol 50 MG tablet  Commonly known as:  ULTRAM      TAKE these medications        aspirin EC 325 MG tablet  Take 1 tablet (325 mg total) by mouth daily.     HYDROcodone-acetaminophen 5-325 MG per tablet  Commonly known as:  NORCO  Take 1 tablet by mouth every 6 (six) hours as needed for moderate pain.     methocarbamol 500 MG tablet  Commonly known as:  ROBAXIN  Take 1 tablet (500 mg total) by mouth every 8 (eight) hours as needed for muscle spasms.        Diagnostic Studies: Dg Hip Complete Right  10/10/2014   CLINICAL DATA:  Right hip fracture.  Subsequent encounter.  EXAM: RIGHT HIP - COMPLETE 2+ VIEW; DG C-ARM 1-60 MIN - NRPT [facility]  COMPARISON:  10/09/2014  FINDINGS: Multiple intraoperative spot films show placement of a sliding screw and intramedullary rod transfixing the intertrochanteric right hip fracture in anatomic alignment. Distal fixation screw is seen within the intramedullary rod in the distal femur. No other fractures identified.  IMPRESSION: Internal fixation of right hip fracture in anatomic alignment.   Electronically Signed   By: Earle Gell M.D.   On: 10/10/2014 10:49   Dg Hip Complete  Right  10/09/2014   CLINICAL DATA:  Patient fell 1 day prior while walking dog  EXAM: RIGHT HIP - COMPLETE 2+ VIEW  COMPARISON:  None.  FINDINGS: Frontal pelvis as well as frontal and lateral right hip images were obtained. There is a comminuted intertrochanteric femur fracture on the right with varus angulation at the fracture site. There is fragmentation of the greater trochanter on the right. No other fractures. No dislocation. Joint spaces appear intact.  IMPRESSION: Comminuted intertrochanteric femur fracture on the right with varus angulation at the fracture site.   Electronically Signed   By: Lowella Grip M.D.   On: 10/09/2014 16:13   Dg Femur Right  10/09/2014   CLINICAL DATA:  [place] while walking dog with right femoral pain  EXAM: RIGHT FEMUR - 2 VIEW  COMPARISON:  None.  FINDINGS: There is an intratrochanteric fracture of the proximal right femur. Some impaction angulation at the fracture site is noted. Large right knee joint effusion is noted. No other focal abnormality is noted.  IMPRESSION: Proximal right femoral fracture.  Large joint effusion in the right knee joint   Electronically Signed   By: Inez Catalina M.D.   On: 10/09/2014 18:20   Dg Pelvis Portable  10/11/2014   CLINICAL DATA:  Patient fell out of chair.  Right hip pain  EXAM: PORTABLE PELVIS 1-2 VIEWS  COMPARISON:  October 10, 2014  FINDINGS: There is postoperative change in the right proximal femur region. There is rod and screw fixation through an intertrochanteric femur fracture. Alignment is near anatomic in this area and stable compared to 1 day prior. The fracture extends into the greater trochanter on the right, stable. No new fracture. No dislocation. There is slight symmetric narrowing of both hip joints. Skin staples are noted on the right.  IMPRESSION: No change from 1 day prior. Postoperative change right proximal femur. No new fracture or dislocation. Slight symmetric narrowing of both hip joints.   Electronically  Signed   By: Lowella Grip M.D.   On: 10/11/2014 13:21   Pelvis Portable  10/10/2014   CLINICAL DATA:  Status post ORIF of right hip fracture.  EXAM: PORTABLE PELVIS 1-2 VIEWS  COMPARISON:  10/09/2014  FINDINGS: The patient is status open reduction and internal fixation of the intertrochanteric fracture of the proximal right femur. There is an intra medullary rod earlier rod and screw device in place. The hardware components and fracture fragments are in anatomic alignment. Gas is identified within the soft tissues around the proximal right femur.  IMPRESSION: 1. Status post ORIF of proximal right femur fracture.   Electronically Signed   By: Kerby Moors M.D.   On: 10/10/2014 04:18   Dg Chest Port 1 View  10/09/2014   CLINICAL DATA:  Preoperative for hip fracture.  EXAM: PORTABLE CHEST - 1 VIEW  COMPARISON:  Apr 18, 2008  FINDINGS: Lungs are clear. Heart size and pulmonary vascularity are normal. No adenopathy. No pneumothorax.  No bone lesions.  IMPRESSION: No edema or consolidation.   Electronically Signed   By: Lowella Grip M.D.   On: 10/09/2014 20:06   Dg C-arm 1-60 Min-no Report  10/10/2014   CLINICAL DATA:  Right hip fracture.  Subsequent encounter.  EXAM: RIGHT HIP - COMPLETE 2+ VIEW; DG C-ARM 1-60 MIN - NRPT [facility]  COMPARISON:  10/09/2014  FINDINGS: Multiple intraoperative spot films show placement of a sliding screw and intramedullary rod transfixing the intertrochanteric right hip fracture in anatomic alignment. Distal fixation screw is seen within the intramedullary rod in the distal femur. No other fractures identified.  IMPRESSION: Internal fixation of right hip fracture in anatomic alignment.   Electronically Signed   By: Earle Gell M.D.   On: 10/10/2014 10:49        Discharge Instructions    Call MD / Call 911    Complete by:  As directed   If you experience chest pain or shortness of breath, CALL 911 and be transported to the hospital emergency room.  If you develope a  fever above 101 F, pus (white drainage) or increased drainage or redness at the wound, or calf pain, call your surgeon's office.     Constipation Prevention    Complete by:  As directed   Drink plenty of fluids.  Prune juice may be helpful.  You may use a stool softener, such as Colace (over the counter) 100 mg twice a day.  Use MiraLax (over the counter) for constipation as needed.     Diet - low sodium heart healthy    Complete by:  As directed      Discharge instructions    Complete by:  As directed   Ok to shower 5 days postop.  Do  Not apply creams or ointments to incisions.  No strenuous activity. Must be compliant with weightbearing restrictions that you have been doing during admission.  No running squatting, lifting, or trying to walk your dog.  Must use walker for ambulating.     Do not sit on low chairs, stoools or toilet seats, as it may be difficult to get up from low surfaces    Complete by:  As directed      Driving restrictions    Complete by:  As directed   No driving until further notice.     Increase activity slowly as tolerated    Complete by:  As directed      Lifting restrictions    Complete by:  As directed   No lifting until further notice.           Follow-up Information    Follow up with Dahlia Bailiff, MD.   Specialty:  Orthopedic Surgery   Why:  needs return office visit 2 weeks postop.  call to schedule appointment.    Contact information:   [address] [place] [address] [phone]       Follow up with [place].   Why:  home health physical therapy   Contact information:   [address] [place] [address] [phone]       Follow up with [place].   Why:  rolling walker   Contact information:   [address] [place]  [address] [phone]       Discharge Plan:  discharge to home Disposition:     Signed: Lanae Crumbly for Dr. Melina Schools [facility] [phone] 10/19/2014, 2:08 PM

## 2015-05-23 ENCOUNTER — Emergency Department (HOSPITAL_COMMUNITY): Payer: 59

## 2015-05-23 ENCOUNTER — Inpatient Hospital Stay (HOSPITAL_COMMUNITY)
Admission: EM | Admit: 2015-05-23 | Discharge: 2015-06-03 | DRG: 028 | Disposition: A | Discharge status: Skilled Nursing Facility | Payer: 59 | Attending: Orthopedic Surgery | Admitting: Orthopedic Surgery

## 2015-05-23 ENCOUNTER — Encounter (HOSPITAL_COMMUNITY): Payer: Self-pay | Admitting: *Deleted

## 2015-05-23 DIAGNOSIS — S14105A Unspecified injury at C5 level of cervical spinal cord, initial encounter: Principal | ICD-10-CM | POA: Diagnosis present

## 2015-05-23 DIAGNOSIS — M25462 Effusion, left knee: Secondary | ICD-10-CM | POA: Diagnosis present

## 2015-05-23 DIAGNOSIS — D62 Acute posthemorrhagic anemia: Secondary | ICD-10-CM | POA: Diagnosis present

## 2015-05-23 DIAGNOSIS — F17210 Nicotine dependence, cigarettes, uncomplicated: Secondary | ICD-10-CM | POA: Diagnosis present

## 2015-05-23 DIAGNOSIS — E876 Hypokalemia: Secondary | ICD-10-CM | POA: Diagnosis not present

## 2015-05-23 DIAGNOSIS — K5900 Constipation, unspecified: Secondary | ICD-10-CM | POA: Diagnosis present

## 2015-05-23 DIAGNOSIS — M4802 Spinal stenosis, cervical region: Secondary | ICD-10-CM | POA: Diagnosis present

## 2015-05-23 DIAGNOSIS — J9690 Respiratory failure, unspecified, unspecified whether with hypoxia or hypercapnia: Secondary | ICD-10-CM | POA: Diagnosis not present

## 2015-05-23 DIAGNOSIS — Z419 Encounter for procedure for purposes other than remedying health state, unspecified: Secondary | ICD-10-CM

## 2015-05-23 DIAGNOSIS — T1490XA Injury, unspecified, initial encounter: Secondary | ICD-10-CM

## 2015-05-23 DIAGNOSIS — S82841A Displaced bimalleolar fracture of right lower leg, initial encounter for closed fracture: Secondary | ICD-10-CM | POA: Diagnosis present

## 2015-05-23 DIAGNOSIS — T1490 Injury, unspecified: Secondary | ICD-10-CM | POA: Diagnosis present

## 2015-05-23 DIAGNOSIS — Z9911 Dependence on respirator [ventilator] status: Secondary | ICD-10-CM

## 2015-05-23 DIAGNOSIS — G8250 Quadriplegia, unspecified: Secondary | ICD-10-CM | POA: Diagnosis present

## 2015-05-23 DIAGNOSIS — S299XXA Unspecified injury of thorax, initial encounter: Secondary | ICD-10-CM

## 2015-05-23 DIAGNOSIS — F438 Other reactions to severe stress: Secondary | ICD-10-CM | POA: Diagnosis present

## 2015-05-23 DIAGNOSIS — Z4659 Encounter for fitting and adjustment of other gastrointestinal appliance and device: Secondary | ICD-10-CM

## 2015-05-23 DIAGNOSIS — M2540 Effusion, unspecified joint: Secondary | ICD-10-CM

## 2015-05-23 DIAGNOSIS — S0181XA Laceration without foreign body of other part of head, initial encounter: Secondary | ICD-10-CM | POA: Diagnosis present

## 2015-05-23 DIAGNOSIS — T794XXA Traumatic shock, initial encounter: Secondary | ICD-10-CM | POA: Diagnosis present

## 2015-05-23 DIAGNOSIS — S82899A Other fracture of unspecified lower leg, initial encounter for closed fracture: Secondary | ICD-10-CM

## 2015-05-23 DIAGNOSIS — S14109A Unspecified injury at unspecified level of cervical spinal cord, initial encounter: Secondary | ICD-10-CM | POA: Diagnosis present

## 2015-05-23 DIAGNOSIS — D649 Anemia, unspecified: Secondary | ICD-10-CM | POA: Diagnosis present

## 2015-05-23 DIAGNOSIS — S82891A Other fracture of right lower leg, initial encounter for closed fracture: Secondary | ICD-10-CM | POA: Diagnosis present

## 2015-05-23 DIAGNOSIS — S14109S Unspecified injury at unspecified level of cervical spinal cord, sequela: Secondary | ICD-10-CM | POA: Diagnosis not present

## 2015-05-23 DIAGNOSIS — M25461 Effusion, right knee: Secondary | ICD-10-CM | POA: Diagnosis present

## 2015-05-23 HISTORY — DX: Unspecified convulsions: R56.9

## 2015-05-23 HISTORY — DX: Fracture of unspecified part of neck of unspecified femur, initial encounter for closed fracture: S72.009A

## 2015-05-23 LAB — DIFFERENTIAL
Basophils Absolute: 0 10*3/uL (ref 0.0–0.1)
Basophils Relative: 1 % (ref 0–1)
EOS PCT: 2 % (ref 0–5)
Eosinophils Absolute: 0.1 10*3/uL (ref 0.0–0.7)
Lymphocytes Relative: 50 % — ABNORMAL HIGH (ref 12–46)
Lymphs Abs: 1.4 10*3/uL (ref 0.7–4.0)
MONOS PCT: 8 % (ref 3–12)
Monocytes Absolute: 0.2 10*3/uL (ref 0.1–1.0)
NEUTROS PCT: 39 % — AB (ref 43–77)
Neutro Abs: 1.1 10*3/uL — ABNORMAL LOW (ref 1.7–7.7)

## 2015-05-23 LAB — PREPARE FRESH FROZEN PLASMA
UNIT DIVISION: 0
Unit division: 0

## 2015-05-23 LAB — URINALYSIS, ROUTINE W REFLEX MICROSCOPIC
Bilirubin Urine: NEGATIVE
Glucose, UA: NEGATIVE mg/dL
Ketones, ur: NEGATIVE mg/dL
LEUKOCYTES UA: NEGATIVE
NITRITE: NEGATIVE
PH: 7.5 (ref 5.0–8.0)
Protein, ur: NEGATIVE mg/dL
Specific Gravity, Urine: 1.006 (ref 1.005–1.030)
UROBILINOGEN UA: 0.2 mg/dL (ref 0.0–1.0)

## 2015-05-23 LAB — TYPE AND SCREEN
ABO/RH(D): O POS
Antibody Screen: NEGATIVE
UNIT DIVISION: 0
Unit division: 0

## 2015-05-23 LAB — I-STAT CHEM 8, ED
BUN: 6 mg/dL (ref 6–20)
Calcium, Ion: 1.08 mmol/L — ABNORMAL LOW (ref 1.12–1.23)
Chloride: 101 mmol/L (ref 101–111)
Creatinine, Ser: 1.5 mg/dL — ABNORMAL HIGH (ref 0.61–1.24)
Glucose, Bld: 96 mg/dL (ref 65–99)
HEMATOCRIT: 38 % — AB (ref 39.0–52.0)
HEMOGLOBIN: 12.9 g/dL — AB (ref 13.0–17.0)
POTASSIUM: 3.3 mmol/L — AB (ref 3.5–5.1)
Sodium: 139 mmol/L (ref 135–145)
TCO2: 22 mmol/L (ref 0–100)

## 2015-05-23 LAB — URINE MICROSCOPIC-ADD ON

## 2015-05-23 LAB — COMPREHENSIVE METABOLIC PANEL
ALK PHOS: 88 U/L (ref 38–126)
ALT: 21 U/L (ref 17–63)
ANION GAP: 13 (ref 5–15)
AST: 36 U/L (ref 15–41)
Albumin: 3.8 g/dL (ref 3.5–5.0)
BILIRUBIN TOTAL: 0.5 mg/dL (ref 0.3–1.2)
BUN: 7 mg/dL (ref 6–20)
CHLORIDE: 100 mmol/L — AB (ref 101–111)
CO2: 22 mmol/L (ref 22–32)
Calcium: 8.6 mg/dL — ABNORMAL LOW (ref 8.9–10.3)
Creatinine, Ser: 0.84 mg/dL (ref 0.61–1.24)
GLUCOSE: 96 mg/dL (ref 65–99)
POTASSIUM: 3.2 mmol/L — AB (ref 3.5–5.1)
Sodium: 135 mmol/L (ref 135–145)
Total Protein: 6.4 g/dL — ABNORMAL LOW (ref 6.5–8.1)

## 2015-05-23 LAB — RAPID URINE DRUG SCREEN, HOSP PERFORMED
Amphetamines: NOT DETECTED
BARBITURATES: NOT DETECTED
BENZODIAZEPINES: NOT DETECTED
Cocaine: NOT DETECTED
Opiates: NOT DETECTED
Tetrahydrocannabinol: POSITIVE — AB

## 2015-05-23 LAB — CBC
HEMATOCRIT: 34.9 % — AB (ref 39.0–52.0)
Hemoglobin: 11.8 g/dL — ABNORMAL LOW (ref 13.0–17.0)
MCH: 32.3 pg (ref 26.0–34.0)
MCHC: 33.8 g/dL (ref 30.0–36.0)
MCV: 95.6 fL (ref 78.0–100.0)
Platelets: 164 10*3/uL (ref 150–400)
RBC: 3.65 MIL/uL — ABNORMAL LOW (ref 4.22–5.81)
RDW: 12.1 % (ref 11.5–15.5)
WBC: 3.2 10*3/uL — ABNORMAL LOW (ref 4.0–10.5)

## 2015-05-23 LAB — ETHANOL: Alcohol, Ethyl (B): 243 mg/dL — ABNORMAL HIGH (ref <5)

## 2015-05-23 LAB — PROTIME-INR
INR: 1.23 (ref 0.00–1.49)
Prothrombin Time: 15.7 seconds — ABNORMAL HIGH (ref 11.6–15.2)

## 2015-05-23 LAB — CDS SEROLOGY

## 2015-05-23 LAB — I-STAT CG4 LACTIC ACID, ED: Lactic Acid, Venous: 1.54 mmol/L (ref 0.5–2.0)

## 2015-05-23 MED ORDER — LIDOCAINE HCL (CARDIAC) 20 MG/ML IV SOLN
INTRAVENOUS | Status: AC
  Filled 2015-05-23: qty 5

## 2015-05-23 MED ORDER — PROPOFOL 1000 MG/100ML IV EMUL
5.0000 ug/kg/min | Freq: Once | INTRAVENOUS | Status: AC
Start: 2015-05-23 — End: 2015-05-23
  Administered 2015-05-23: 20 ug/kg/min via INTRAVENOUS
  Filled 2015-05-23: qty 100

## 2015-05-23 MED ORDER — SUCCINYLCHOLINE CHLORIDE 20 MG/ML IJ SOLN
100.0000 mg | Freq: Once | INTRAMUSCULAR | Status: AC
Start: 2015-05-23 — End: 2015-05-23
  Administered 2015-05-23: 100 mg via INTRAVENOUS

## 2015-05-23 MED ORDER — ETOMIDATE 2 MG/ML IV SOLN
INTRAVENOUS | Status: AC
  Filled 2015-05-23: qty 20

## 2015-05-23 MED ORDER — LIDOCAINE HCL (PF) 1 % IJ SOLN
5.0000 mL | Freq: Once | INTRAMUSCULAR | Status: AC
  Administered 2015-05-23: 5 mL via INTRADERMAL

## 2015-05-23 MED ORDER — SODIUM CHLORIDE 0.9 % IV SOLN
INTRAVENOUS | Status: AC | PRN
  Administered 2015-05-23: 125 mL/h via INTRAVENOUS
  Administered 2015-05-23 (×3): 1000 mL via INTRAVENOUS

## 2015-05-23 MED ORDER — PHENYLEPHRINE HCL 10 MG/ML IJ SOLN
0.0000 ug/min | Freq: Once | INTRAVENOUS | Status: AC
  Administered 2015-05-24: 20 ug/min via INTRAVENOUS
  Filled 2015-05-23: qty 1

## 2015-05-23 MED ORDER — SUCCINYLCHOLINE CHLORIDE 20 MG/ML IJ SOLN
INTRAMUSCULAR | Status: AC
  Filled 2015-05-23: qty 1

## 2015-05-23 MED ORDER — LIDOCAINE HCL (PF) 1 % IJ SOLN
INTRAMUSCULAR | Status: AC
  Filled 2015-05-23: qty 5

## 2015-05-23 MED ORDER — ETOMIDATE 2 MG/ML IV SOLN
20.0000 mg | Freq: Once | INTRAVENOUS | Status: AC
  Administered 2015-05-23: 20 mg via INTRAVENOUS

## 2015-05-23 MED ORDER — ROCURONIUM BROMIDE 50 MG/5ML IV SOLN
INTRAVENOUS | Status: AC
  Filled 2015-05-23: qty 2

## 2015-05-23 MED ORDER — IOHEXOL 350 MG/ML SOLN
100.0000 mL | Freq: Once | INTRAVENOUS | Status: AC | PRN
  Administered 2015-05-23: 100 mL via INTRAVENOUS

## 2015-05-23 NOTE — ED Notes (Signed)
Pt back from CT

## 2015-05-23 NOTE — ED Notes (Signed)
Pt intubated by Dr Laneta Simmers.
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
## 2015-05-23 NOTE — ED Notes (Signed)
Suture cart bedside. 
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
## 2015-05-23 NOTE — ED Notes (Signed)
Pt taken to CT, prior to being moved onto CT table pt began c/o not being able to breath. Pt taken back to TRA B for intubation.
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
## 2015-05-24 ENCOUNTER — Inpatient Hospital Stay (HOSPITAL_COMMUNITY): Payer: 59

## 2015-05-24 DIAGNOSIS — S14109A Unspecified injury at unspecified level of cervical spinal cord, initial encounter: Secondary | ICD-10-CM | POA: Diagnosis present

## 2015-05-24 DIAGNOSIS — D649 Anemia, unspecified: Secondary | ICD-10-CM | POA: Diagnosis present

## 2015-05-24 DIAGNOSIS — F438 Other reactions to severe stress: Secondary | ICD-10-CM | POA: Diagnosis present

## 2015-05-24 DIAGNOSIS — T794XXA Traumatic shock, initial encounter: Secondary | ICD-10-CM

## 2015-05-24 HISTORY — DX: Traumatic shock, initial encounter: T79.4XXA

## 2015-05-24 LAB — CBC
HEMATOCRIT: 29.1 % — AB (ref 39.0–52.0)
HEMOGLOBIN: 9.8 g/dL — AB (ref 13.0–17.0)
MCH: 31.9 pg (ref 26.0–34.0)
MCHC: 33.7 g/dL (ref 30.0–36.0)
MCV: 94.8 fL (ref 78.0–100.0)
Platelets: 133 10*3/uL — ABNORMAL LOW (ref 150–400)
RBC: 3.07 MIL/uL — AB (ref 4.22–5.81)
RDW: 12.2 % (ref 11.5–15.5)
WBC: 3.1 10*3/uL — ABNORMAL LOW (ref 4.0–10.5)

## 2015-05-24 LAB — BASIC METABOLIC PANEL
ANION GAP: 12 (ref 5–15)
BUN: 5 mg/dL — ABNORMAL LOW (ref 6–20)
CHLORIDE: 108 mmol/L (ref 101–111)
CO2: 19 mmol/L — ABNORMAL LOW (ref 22–32)
CREATININE: 0.64 mg/dL (ref 0.61–1.24)
Calcium: 8 mg/dL — ABNORMAL LOW (ref 8.9–10.3)
GFR calc non Af Amer: >60 mL/min (ref >60)
Glucose, Bld: 98 mg/dL (ref 65–99)
Potassium: 3.1 mmol/L — ABNORMAL LOW (ref 3.5–5.1)
SODIUM: 139 mmol/L (ref 135–145)

## 2015-05-24 LAB — ABO/RH: ABO/RH(D): O POS

## 2015-05-24 LAB — BLOOD PRODUCT ORDER (VERBAL) VERIFICATION

## 2015-05-24 LAB — MRSA PCR SCREENING: MRSA BY PCR: NEGATIVE

## 2015-05-24 LAB — LIPASE, BLOOD: Lipase: 22 U/L (ref 22–51)

## 2015-05-24 LAB — PROTIME-INR
INR: 1.34 (ref 0.00–1.49)
Prothrombin Time: 16.7 seconds — ABNORMAL HIGH (ref 11.6–15.2)

## 2015-05-24 LAB — TRIGLYCERIDES: TRIGLYCERIDES: 218 mg/dL — AB (ref <150)

## 2015-05-24 MED ORDER — LORAZEPAM 2 MG/ML IJ SOLN
1.0000 mg | INTRAMUSCULAR | Status: DC | PRN
  Administered 2015-05-27 – 2015-05-30 (×2): 1 mg via INTRAVENOUS
  Filled 2015-05-24 (×2): qty 1

## 2015-05-24 MED ORDER — PIVOT 1.5 CAL PO LIQD
1000.0000 mL | ORAL | Status: DC
  Filled 2015-05-24 (×2): qty 1000

## 2015-05-24 MED ORDER — FENTANYL CITRATE (PF) 100 MCG/2ML IJ SOLN
100.0000 ug | INTRAMUSCULAR | Status: DC | PRN
  Filled 2015-05-24: qty 2

## 2015-05-24 MED ORDER — ADULT MULTIVITAMIN LIQUID CH
5.0000 mL | Freq: Every day | ORAL | Status: DC
  Administered 2015-05-24 – 2015-05-25 (×2): 5 mL
  Filled 2015-05-24 (×6): qty 5

## 2015-05-24 MED ORDER — DEXTROSE-NACL 5-0.9 % IV SOLN
INTRAVENOUS | Status: DC
  Administered 2015-05-24 – 2015-05-28 (×8): via INTRAVENOUS
  Administered 2015-05-29: 100 mL/h via INTRAVENOUS
  Administered 2015-05-30 (×2): via INTRAVENOUS
  Administered 2015-05-31: 1000 mL via INTRAVENOUS

## 2015-05-24 MED ORDER — PROPOFOL 1000 MG/100ML IV EMUL
5.0000 ug/kg/min | INTRAVENOUS | Status: DC
  Administered 2015-05-24: 20 ug/kg/min via INTRAVENOUS
  Administered 2015-05-24: 40 ug/kg/min via INTRAVENOUS
  Administered 2015-05-24: 30 ug/kg/min via INTRAVENOUS
  Administered 2015-05-24 (×3): 40 ug/kg/min via INTRAVENOUS
  Administered 2015-05-25: 30.025 ug/kg/min via INTRAVENOUS
  Administered 2015-05-25: 50 ug/kg/min via INTRAVENOUS
  Administered 2015-05-25: 20 ug/kg/min via INTRAVENOUS
  Administered 2015-05-26 – 2015-05-27 (×5): 40 ug/kg/min via INTRAVENOUS
  Filled 2015-05-24 (×16): qty 100

## 2015-05-24 MED ORDER — PROPOFOL 1000 MG/100ML IV EMUL: 5.0000 ug/kg/min | Freq: Once | INTRAVENOUS | Status: DC

## 2015-05-24 MED ORDER — PROPOFOL 1000 MG/100ML IV EMUL
INTRAVENOUS | Status: AC
  Filled 2015-05-24: qty 100

## 2015-05-24 MED ORDER — SELENIUM 50 MCG PO TABS
200.0000 ug | ORAL_TABLET | Freq: Every day | ORAL | Status: AC
  Administered 2015-05-24 – 2015-05-29 (×3): 200 ug
  Filled 2015-05-24 (×7): qty 4

## 2015-05-24 MED ORDER — FENTANYL CITRATE (PF) 100 MCG/2ML IJ SOLN
100.0000 ug | INTRAMUSCULAR | Status: DC | PRN
  Administered 2015-05-24 – 2015-05-25 (×6): 100 ug via INTRAVENOUS
  Administered 2015-05-25: 50 ug via INTRAVENOUS
  Administered 2015-05-25 – 2015-05-27 (×15): 100 ug via INTRAVENOUS
  Filled 2015-05-24 (×21): qty 2

## 2015-05-24 MED ORDER — VITAMIN C 500 MG PO TABS
1000.0000 mg | ORAL_TABLET | Freq: Three times a day (TID) | ORAL | Status: AC
  Administered 2015-05-24 – 2015-05-30 (×16): 1000 mg
  Filled 2015-05-24 (×23): qty 2

## 2015-05-24 MED ORDER — CHLORHEXIDINE GLUCONATE 0.12 % MT SOLN
15.0000 mL | Freq: Two times a day (BID) | OROMUCOSAL | Status: DC
  Administered 2015-05-24 – 2015-05-29 (×12): 15 mL via OROMUCOSAL
  Filled 2015-05-24 (×11): qty 15

## 2015-05-24 MED ORDER — PHENYLEPHRINE HCL 10 MG/ML IJ SOLN
0.0000 ug/min | INTRAVENOUS | Status: DC
  Administered 2015-05-24: 20 ug/min via INTRAVENOUS
  Administered 2015-05-24: 10 ug/min via INTRAVENOUS
  Filled 2015-05-24: qty 1

## 2015-05-24 MED ORDER — POTASSIUM CHLORIDE 10 MEQ/100ML IV SOLN
10.0000 meq | INTRAVENOUS | Status: AC
  Administered 2015-05-24 (×4): 10 meq via INTRAVENOUS
  Filled 2015-05-24 (×4): qty 100

## 2015-05-24 MED ORDER — CETYLPYRIDINIUM CHLORIDE 0.05 % MT LIQD
7.0000 mL | Freq: Four times a day (QID) | OROMUCOSAL | Status: DC
  Administered 2015-05-24 – 2015-06-03 (×35): 7 mL via OROMUCOSAL

## 2015-05-24 MED ORDER — PIVOT 1.5 CAL PO LIQD
1000.0000 mL | ORAL | Status: DC
  Administered 2015-05-24 – 2015-05-26 (×2): 1000 mL
  Filled 2015-05-24 (×5): qty 1000

## 2015-05-24 MED ORDER — PRO-STAT SUGAR FREE PO LIQD
60.0000 mL | Freq: Two times a day (BID) | ORAL | Status: DC
  Administered 2015-05-24 – 2015-05-26 (×5): 60 mL
  Filled 2015-05-24 (×9): qty 60

## 2015-05-24 NOTE — ED Notes (Signed)
Family at bedside. 
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
## 2015-05-24 NOTE — ED Notes (Signed)
All pt belongings including $12 cash, clothing and shoes are given to family.
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
## 2015-05-24 NOTE — ED Notes (Signed)
Pt taken to MRI on monitor with this RN and an RT. Pt tolerated MRI with no change in status.
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
## 2015-05-24 NOTE — Progress Notes (Signed)
Chaplain escorted family to consultation room B, where they waited for information from MD.  Other family members came and went.   Spiritual conversation, comfort measures given. Chaplain will escort family to [place] waiting area while patient is in MRI.  Rev. Jan :Hill.Chaplain [phone]

## 2015-05-24 NOTE — Progress Notes (Signed)
Orthopedic Tech Progress Note Patient Details:  Phillip [name] 19-Jun-1966 [id number]  Ortho Devices Type of Ortho Device: Ace wrap, Post (short leg) splint Ortho Device/Splint Location: RLE Ortho Device/Splint Interventions: Ordered, Application   Braulio Bosch 05/24/2015, 7:59 PM
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
## 2015-05-24 NOTE — Progress Notes (Signed)
  Distal tib/fib involving the medial malleolus, lateral displacement of the talus.  Dr. Marlou Sa called for a consult.   Phillip [name], ANP-BC

## 2015-05-25 ENCOUNTER — Inpatient Hospital Stay (HOSPITAL_COMMUNITY): Payer: 59

## 2015-05-25 LAB — GLUCOSE, CAPILLARY
Glucose-Capillary: 133 mg/dL — ABNORMAL HIGH (ref 65–99)
Glucose-Capillary: 131 mg/dL — ABNORMAL HIGH (ref 65–99)
Glucose-Capillary: 125 mg/dL — ABNORMAL HIGH (ref 65–99)
Glucose-Capillary: 149 mg/dL — ABNORMAL HIGH (ref 65–99)

## 2015-05-25 LAB — BASIC METABOLIC PANEL
ANION GAP: 6 (ref 5–15)
BUN: 10 mg/dL (ref 6–20)
CALCIUM: 8.5 mg/dL — AB (ref 8.9–10.3)
CHLORIDE: 107 mmol/L (ref 101–111)
CO2: 25 mmol/L (ref 22–32)
Creatinine, Ser: 0.54 mg/dL — ABNORMAL LOW (ref 0.61–1.24)
GFR calc non Af Amer: >60 mL/min (ref >60)
Glucose, Bld: 143 mg/dL — ABNORMAL HIGH (ref 65–99)
Potassium: 3.6 mmol/L (ref 3.5–5.1)
SODIUM: 138 mmol/L (ref 135–145)

## 2015-05-25 LAB — CBC
HCT: 29.4 % — ABNORMAL LOW (ref 39.0–52.0)
Hemoglobin: 9.7 g/dL — ABNORMAL LOW (ref 13.0–17.0)
MCH: 31.9 pg (ref 26.0–34.0)
MCHC: 33 g/dL (ref 30.0–36.0)
MCV: 96.7 fL (ref 78.0–100.0)
PLATELETS: 128 10*3/uL — AB (ref 150–400)
RBC: 3.04 MIL/uL — ABNORMAL LOW (ref 4.22–5.81)
RDW: 12.4 % (ref 11.5–15.5)
WBC: 10 10*3/uL (ref 4.0–10.5)

## 2015-05-25 MED ORDER — PNEUMOCOCCAL VAC POLYVALENT 25 MCG/0.5ML IJ INJ
0.5000 mL | INJECTION | INTRAMUSCULAR | Status: AC
  Administered 2015-05-26: 0.5 mL via INTRAMUSCULAR
  Filled 2015-05-25: qty 0.5

## 2015-05-25 MED ORDER — PANTOPRAZOLE SODIUM 40 MG PO PACK
40.0000 mg | PACK | Freq: Every day | ORAL | Status: DC
  Administered 2015-05-25 – 2015-05-28 (×2): 40 mg
  Filled 2015-05-25 (×5): qty 20

## 2015-05-25 MED ORDER — BACITRACIN-NEOMYCIN-POLYMYXIN OINTMENT TUBE
1.0000 "application " | TOPICAL_OINTMENT | Freq: Every day | CUTANEOUS | Status: DC
  Administered 2015-05-25 – 2015-06-03 (×9): 1 via TOPICAL
  Filled 2015-05-25: qty 15
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
## 2015-05-25 NOTE — Progress Notes (Signed)
Patient for right ankle fracture fixation tomorrow Risk and benefits discussed with his sister All QUESTIONS answered Need to hold tube feeds 6 hours before surgery which will likely be sometime in the afternoon Patient is on no blood thinners

## 2015-05-26 ENCOUNTER — Encounter (HOSPITAL_COMMUNITY): Admission: EM | Disposition: A | Discharge status: Skilled Nursing Facility | Payer: Self-pay | Source: Home / Self Care

## 2015-05-26 ENCOUNTER — Other Ambulatory Visit (HOSPITAL_COMMUNITY): Payer: Self-pay | Admitting: Orthopedic Surgery

## 2015-05-26 ENCOUNTER — Encounter (HOSPITAL_COMMUNITY): Payer: Self-pay | Admitting: Certified Registered Nurse Anesthetist

## 2015-05-26 ENCOUNTER — Inpatient Hospital Stay (HOSPITAL_COMMUNITY): Payer: 59 | Admitting: Anesthesiology

## 2015-05-26 ENCOUNTER — Inpatient Hospital Stay (HOSPITAL_COMMUNITY): Payer: 59

## 2015-05-26 DIAGNOSIS — S14105A Unspecified injury at C5 level of cervical spinal cord, initial encounter: Secondary | ICD-10-CM | POA: Diagnosis present

## 2015-05-26 HISTORY — PX: ORIF ANKLE FRACTURE: SHX5408

## 2015-05-26 LAB — BASIC METABOLIC PANEL
ANION GAP: 7 (ref 5–15)
BUN: 12 mg/dL (ref 6–20)
CHLORIDE: 106 mmol/L (ref 101–111)
CO2: 26 mmol/L (ref 22–32)
CREATININE: 0.55 mg/dL — AB (ref 0.61–1.24)
Calcium: 8.4 mg/dL — ABNORMAL LOW (ref 8.9–10.3)
GFR calc non Af Amer: >60 mL/min (ref >60)
Glucose, Bld: 118 mg/dL — ABNORMAL HIGH (ref 65–99)
Potassium: 3.2 mmol/L — ABNORMAL LOW (ref 3.5–5.1)
Sodium: 139 mmol/L (ref 135–145)

## 2015-05-26 LAB — GLUCOSE, CAPILLARY
GLUCOSE-CAPILLARY: 105 mg/dL — AB (ref 65–99)
GLUCOSE-CAPILLARY: 133 mg/dL — AB (ref 65–99)
Glucose-Capillary: 129 mg/dL — ABNORMAL HIGH (ref 65–99)
Glucose-Capillary: 143 mg/dL — ABNORMAL HIGH (ref 65–99)

## 2015-05-26 LAB — CBC
HCT: 29.2 % — ABNORMAL LOW (ref 39.0–52.0)
Hemoglobin: 9.7 g/dL — ABNORMAL LOW (ref 13.0–17.0)
MCH: 32.1 pg (ref 26.0–34.0)
MCHC: 33.2 g/dL (ref 30.0–36.0)
MCV: 96.7 fL (ref 78.0–100.0)
Platelets: 124 10*3/uL — ABNORMAL LOW (ref 150–400)
RBC: 3.02 MIL/uL — AB (ref 4.22–5.81)
RDW: 12.3 % (ref 11.5–15.5)
WBC: 8.8 10*3/uL (ref 4.0–10.5)

## 2015-05-26 SURGERY — OPEN REDUCTION INTERNAL FIXATION (ORIF) ANKLE FRACTURE
Anesthesia: General | Site: Ankle | Laterality: Right

## 2015-05-26 MED ORDER — PROPOFOL 10 MG/ML IV BOLUS
INTRAVENOUS | Status: AC
  Filled 2015-05-26: qty 20

## 2015-05-26 MED ORDER — POTASSIUM CHLORIDE 10 MEQ/100ML IV SOLN
INTRAVENOUS | Status: DC | PRN
  Administered 2015-05-26: 10 meq via INTRAVENOUS

## 2015-05-26 MED ORDER — ACETAMINOPHEN 325 MG PO TABS
650.0000 mg | ORAL_TABLET | Freq: Four times a day (QID) | ORAL | Status: DC | PRN
  Administered 2015-05-29: 650 mg via ORAL
  Filled 2015-05-26: qty 2

## 2015-05-26 MED ORDER — MIDAZOLAM HCL 2 MG/2ML IJ SOLN
INTRAMUSCULAR | Status: AC
  Filled 2015-05-26: qty 2

## 2015-05-26 MED ORDER — METOCLOPRAMIDE HCL 5 MG PO TABS
5.0000 mg | ORAL_TABLET | Freq: Three times a day (TID) | ORAL | Status: DC | PRN
  Filled 2015-05-26: qty 2

## 2015-05-26 MED ORDER — 0.9 % SODIUM CHLORIDE (POUR BTL) OPTIME
TOPICAL | Status: DC | PRN
  Administered 2015-05-26 (×2): 1000 mL

## 2015-05-26 MED ORDER — FENTANYL CITRATE (PF) 250 MCG/5ML IJ SOLN
INTRAMUSCULAR | Status: AC
  Filled 2015-05-26: qty 5

## 2015-05-26 MED ORDER — FENTANYL CITRATE (PF) 100 MCG/2ML IJ SOLN
INTRAMUSCULAR | Status: DC | PRN
  Administered 2015-05-26 (×2): 50 ug via INTRAVENOUS

## 2015-05-26 MED ORDER — METOCLOPRAMIDE HCL 5 MG/ML IJ SOLN
5.0000 mg | Freq: Three times a day (TID) | INTRAMUSCULAR | Status: DC | PRN
  Filled 2015-05-26: qty 2

## 2015-05-26 MED ORDER — PROPOFOL INFUSION 10 MG/ML OPTIME
INTRAVENOUS | Status: DC | PRN
  Administered 2015-05-26: 40 ug/kg/min via INTRAVENOUS

## 2015-05-26 MED ORDER — LACTATED RINGERS IV SOLN
INTRAVENOUS | Status: DC | PRN
  Administered 2015-05-26: 15:00:00 via INTRAVENOUS

## 2015-05-26 MED ORDER — ROCURONIUM BROMIDE 100 MG/10ML IV SOLN
INTRAVENOUS | Status: DC | PRN
  Administered 2015-05-26: 40 mg via INTRAVENOUS
  Administered 2015-05-26: 50 mg via INTRAVENOUS

## 2015-05-26 MED ORDER — CEFAZOLIN SODIUM-DEXTROSE 2-3 GM-% IV SOLR
INTRAVENOUS | Status: DC | PRN
  Administered 2015-05-26: 2 g via INTRAVENOUS

## 2015-05-26 MED ORDER — ACETAMINOPHEN 650 MG RE SUPP
650.0000 mg | Freq: Four times a day (QID) | RECTAL | Status: DC | PRN
Start: 2015-05-26 — End: 2015-06-01

## 2015-05-26 MED ORDER — CHLORHEXIDINE GLUCONATE 4 % EX LIQD: 60.0000 mL | Freq: Once | CUTANEOUS | Status: DC

## 2015-05-26 MED ORDER — CEFAZOLIN SODIUM-DEXTROSE 2-3 GM-% IV SOLR: 2.0000 g | INTRAVENOUS | Status: DC

## 2015-05-26 MED ORDER — CEFAZOLIN SODIUM-DEXTROSE 2-3 GM-% IV SOLR
2.0000 g | Freq: Four times a day (QID) | INTRAVENOUS | Status: AC
  Administered 2015-05-27: 2 g via INTRAVENOUS
  Filled 2015-05-26 (×2): qty 50

## 2015-05-26 MED ORDER — ONDANSETRON HCL 4 MG/2ML IJ SOLN
4.0000 mg | Freq: Four times a day (QID) | INTRAMUSCULAR | Status: DC | PRN
  Administered 2015-05-30: 4 mg via INTRAVENOUS

## 2015-05-26 MED ORDER — ONDANSETRON HCL 4 MG PO TABS: 4.0000 mg | ORAL_TABLET | Freq: Four times a day (QID) | ORAL | Status: DC | PRN

## 2015-05-26 MED ORDER — POTASSIUM CHLORIDE 10 MEQ/100ML IV SOLN
10.0000 meq | INTRAVENOUS | Status: AC
  Administered 2015-05-26 (×3): 10 meq via INTRAVENOUS
  Filled 2015-05-26 (×4): qty 100

## 2015-05-26 SURGICAL SUPPLY — 72 items
1.3 X 40 K WIRE ×6 IMPLANT
BANDAGE ELASTIC 4 VELCRO ST LF (GAUZE/BANDAGES/DRESSINGS) ×3 IMPLANT
BANDAGE ELASTIC 6 VELCRO ST LF (GAUZE/BANDAGES/DRESSINGS) ×3 IMPLANT
BIT DRILL 2.7 QC CANN 155 (BIT) ×1 IMPLANT
BIT DRILL 2.7 QC CANN 155MM (BIT) ×1
BIT DRILL JACOB END 9/64INX5IN (BIT) ×2 IMPLANT
BIT DRILL QC 2.7 6.3IN  SHORT (BIT) ×2
BIT DRILL QC 2.7 6.3IN SHORT (BIT) IMPLANT
BLADE SURG 10 STRL SS (BLADE) IMPLANT
BNDG CMPR 9X4 STRL LF SNTH (GAUZE/BANDAGES/DRESSINGS) ×1
BNDG COHESIVE 6X5 TAN STRL LF (GAUZE/BANDAGES/DRESSINGS) ×3 IMPLANT
BNDG ESMARK 4X9 LF (GAUZE/BANDAGES/DRESSINGS) ×3 IMPLANT
BNDG GAUZE ELAST 4 BULKY (GAUZE/BANDAGES/DRESSINGS) ×3 IMPLANT
COVER SURGICAL LIGHT HANDLE (MISCELLANEOUS) ×3 IMPLANT
DRAPE C-ARM 42X72 X-RAY (DRAPES) ×3 IMPLANT
DRAPE C-ARMOR (DRAPES) ×2 IMPLANT
DRAPE INCISE IOBAN 66X45 STRL (DRAPES) IMPLANT
DRAPE SURG 17X23 STRL (DRAPES) ×3 IMPLANT
DRAPE U-SHAPE 47X51 STRL (DRAPES) ×3 IMPLANT
DRESSING ALLEVYN LIFE SACRUM (GAUZE/BANDAGES/DRESSINGS) ×2 IMPLANT
DRSG PAD ABDOMINAL 8X10 ST (GAUZE/BANDAGES/DRESSINGS) ×3 IMPLANT
DURAPREP 26ML APPLICATOR (WOUND CARE) ×2 IMPLANT
GAUZE SPONGE 4X4 12PLY STRL (GAUZE/BANDAGES/DRESSINGS) ×3 IMPLANT
GAUZE XEROFORM 1X8 LF (GAUZE/BANDAGES/DRESSINGS) ×2 IMPLANT
GAUZE XEROFORM 5X9 LF (GAUZE/BANDAGES/DRESSINGS) ×3 IMPLANT
GLOVE BIO SURGEON STRL SZ7 (GLOVE) ×6 IMPLANT
GLOVE BIOGEL PI IND STRL 7.5 (GLOVE) IMPLANT
GLOVE BIOGEL PI IND STRL 8 (GLOVE) ×1 IMPLANT
GLOVE BIOGEL PI INDICATOR 7.5 (GLOVE) ×2
GLOVE BIOGEL PI INDICATOR 8 (GLOVE) ×2
GLOVE SURG ORTHO 8.0 STRL STRW (GLOVE) ×3 IMPLANT
GOWN STRL REUS W/ TWL LRG LVL3 (GOWN DISPOSABLE) ×3 IMPLANT
GOWN STRL REUS W/TWL LRG LVL3 (GOWN DISPOSABLE) ×9
KIT BASIN OR (CUSTOM PROCEDURE TRAY) ×3 IMPLANT
KIT ROOM TURNOVER OR (KITS) ×3 IMPLANT
NDL HYPO 25GX1X1/2 BEV (NEEDLE) ×1 IMPLANT
NEEDLE HYPO 25GX1X1/2 BEV (NEEDLE) ×3 IMPLANT
NS IRRIG 1000ML POUR BTL (IV SOLUTION) ×3 IMPLANT
PACK ORTHO EXTREMITY (CUSTOM PROCEDURE TRAY) ×3 IMPLANT
PAD ABD 8X10 STRL (GAUZE/BANDAGES/DRESSINGS) ×2 IMPLANT
PAD ARMBOARD 7.5X6 YLW CONV (MISCELLANEOUS) ×6 IMPLANT
PAD CAST 4YDX4 CTTN HI CHSV (CAST SUPPLIES) ×2 IMPLANT
PADDING CAST COTTON 4X4 STRL (CAST SUPPLIES) ×3
PADDING CAST COTTON 6X4 STRL (CAST SUPPLIES) ×2 IMPLANT
PLATE FIBULA DISTAL 7 HOLE (Plate) ×2 IMPLANT
SCREW 5.0X18 (Screw) ×2 IMPLANT
SCREW CANC 5.0X14 (Screw) ×2 IMPLANT
SCREW CANNULATED 4.1X40 (Screw) ×4 IMPLANT
SCREW LOCK 12X3.5XST PRLC (Screw) IMPLANT
SCREW LOCK 3.5X12 (Screw) ×3 IMPLANT
SCREW LOCK 3.5X14 (Screw) ×2 IMPLANT
SCREW NON LOCK 3.5X12 (Screw) ×6 IMPLANT
SCREW NON LOCK 3.5X20 (Screw) ×2 IMPLANT
SCREW NONLOCK 3.5X10 (Screw) ×2 IMPLANT
SCREW NONLOCK 3.5X24 (Screw) ×2 IMPLANT
SCREW NONLOCK 3.5X26 (Screw) ×2 IMPLANT
SPLINT PLASTER CAST XFAST 5X30 (CAST SUPPLIES) IMPLANT
SPLINT PLASTER XFAST SET 5X30 (CAST SUPPLIES) ×2
SPONGE GAUZE 4X4 12PLY STER LF (GAUZE/BANDAGES/DRESSINGS) ×2 IMPLANT
STOCKINETTE IMPERVIOUS 9X36 MD (GAUZE/BANDAGES/DRESSINGS) IMPLANT
SUCTION FRAZIER TIP 10 FR DISP (SUCTIONS) ×3 IMPLANT
SUT ETHILON 3 0 PS 1 (SUTURE) ×10 IMPLANT
SUT VIC AB 2-0 CT1 27 (SUTURE) ×6
SUT VIC AB 2-0 CT1 TAPERPNT 27 (SUTURE) IMPLANT
SUT VIC AB 2-0 CTB1 (SUTURE) ×6 IMPLANT
SUT VIC AB 3-0 SH 27 (SUTURE) ×6
SUT VIC AB 3-0 SH 27X BRD (SUTURE) ×2 IMPLANT
TOWEL OR 17X24 6PK STRL BLUE (TOWEL DISPOSABLE) ×3 IMPLANT
TOWEL OR 17X26 10 PK STRL BLUE (TOWEL DISPOSABLE) ×3 IMPLANT
TUBE CONNECTING 12'X1/4 (SUCTIONS) ×1
TUBE CONNECTING 12X1/4 (SUCTIONS) ×2 IMPLANT
YANKAUER SUCT BULB TIP NO VENT (SUCTIONS) IMPLANT

## 2015-05-26 NOTE — Brief Op Note (Signed)
05/23/2015 - 05/26/2015  4:59 PM  PATIENT:  Phillip [name]  49 y.o. male  PRE-OPERATIVE DIAGNOSIS:  right ankle fracture  POST-OPERATIVE DIAGNOSIS:  right ankle fracture  PROCEDURE:  Procedure(s): OPEN REDUCTION INTERNAL FIXATION (ORIF) ANKLE FRACTURE  SURGEON:  Surgeon(s): Meredith Pel, MD  ASSISTANT: Laure Kidney rnfa  ANESTHESIA:   general  EBL: 50 ml    Total I/O In: 1394.5 [I.V.:1094.5; IV Piggyback:300] Out: 650 [Urine:575; Blood:75]  BLOOD ADMINISTERED: none  DRAINS: none   LOCAL MEDICATIONS USED:  none  SPECIMEN:  No Specimen  COUNTS:  YES  TOURNIQUET:  * No tourniquets in log *  DICTATION: .Other Dictation: Dictation Number [id number]  PLAN OF CARE: Admit to inpatient   PATIENT DISPOSITION:  PACU - hemodynamically stable
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
## 2015-05-26 NOTE — Progress Notes (Signed)
Overall situation remains stable. Patient remains intubated. He will awaken and follow some simple commands. Still with 4 minus over 5 deltoid and biceps strength bilaterally. No motor function distal to this level. Sensory examination incompletely performed secondary to intubation but patient appears to have diminished sensation distal to C5.  Severe cervical spondylosis status post trauma with severe spinal cord injury. Planned delayed decompressive surgery early next week.

## 2015-05-26 NOTE — Transfer of Care (Signed)
Immediate Anesthesia Transfer of Care Note  Patient: Phillip [name]  Procedure(s) Performed: Procedure(s): OPEN REDUCTION INTERNAL FIXATION (ORIF) ANKLE FRACTURE (Right)  Patient Location: NICU  Anesthesia Type:General  Level of Consciousness: sedated and Patient remains intubated per anesthesia plan  Airway & Oxygen Therapy: Patient remains intubated per anesthesia plan and Patient placed on Ventilator (see vital sign flow sheet for setting)  Post-op Assessment: Report given to RN and Post -op Vital signs reviewed and stable  Post vital signs: Reviewed and stable  Last Vitals:  Filed Vitals:   05/26/15 1300  BP: 121/74  Pulse: 70  Temp: 36.6 C  Resp: 16    Complications: No apparent anesthesia complications      Report given to ICU RN and all questions answered. Airway intact. SPO2 100%. BP 121/74. VSS
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
## 2015-05-26 NOTE — Interval H&P Note (Signed)
History and Physical Interval Note:  05/26/2015 7:26 AM  Phillip [name]  has presented today for surgery, with the diagnosis of right ankle fracture  The various methods of treatment have been discussed with the patient and family. After consideration of risks, benefits and other options for treatment, the patient has consented to  Procedure(s): OPEN REDUCTION INTERNAL FIXATION (ORIF) ANKLE FRACTURE (Right) as a surgical intervention .  The patient's history has been reviewed, patient examined, no change in status, stable for surgery.  I have reviewed the patient's chart and labs.  Questions were answered to the patient's satisfaction.     Jazsmine Macari SCOTT
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
## 2015-05-27 ENCOUNTER — Encounter (HOSPITAL_COMMUNITY): Payer: Self-pay | Admitting: Orthopedic Surgery

## 2015-05-27 ENCOUNTER — Inpatient Hospital Stay (HOSPITAL_COMMUNITY): Payer: 59

## 2015-05-27 ENCOUNTER — Other Ambulatory Visit: Payer: Self-pay | Admitting: Neurosurgery

## 2015-05-27 LAB — BASIC METABOLIC PANEL
Anion gap: 5 (ref 5–15)
BUN: 10 mg/dL (ref 6–20)
CALCIUM: 8 mg/dL — AB (ref 8.9–10.3)
CO2: 24 mmol/L (ref 22–32)
Chloride: 110 mmol/L (ref 101–111)
Creatinine, Ser: 0.53 mg/dL — ABNORMAL LOW (ref 0.61–1.24)
GFR calc Af Amer: >60 mL/min (ref >60)
GFR calc non Af Amer: >60 mL/min (ref >60)
GLUCOSE: 153 mg/dL — AB (ref 65–99)
Potassium: 3.4 mmol/L — ABNORMAL LOW (ref 3.5–5.1)
SODIUM: 139 mmol/L (ref 135–145)

## 2015-05-27 LAB — POCT I-STAT 4, (NA,K, GLUC, HGB,HCT)
Glucose, Bld: 100 mg/dL — ABNORMAL HIGH (ref 65–99)
HCT: 26 % — ABNORMAL LOW (ref 39.0–52.0)
Hemoglobin: 8.8 g/dL — ABNORMAL LOW (ref 13.0–17.0)
Potassium: 3.4 mmol/L — ABNORMAL LOW (ref 3.5–5.1)
SODIUM: 141 mmol/L (ref 135–145)

## 2015-05-27 LAB — CBC WITH DIFFERENTIAL/PLATELET
BASOS PCT: 0 % (ref 0–1)
Basophils Absolute: 0 10*3/uL (ref 0.0–0.1)
EOS ABS: 0.1 10*3/uL (ref 0.0–0.7)
EOS PCT: 1 % (ref 0–5)
HEMATOCRIT: 28.7 % — AB (ref 39.0–52.0)
Hemoglobin: 9.4 g/dL — ABNORMAL LOW (ref 13.0–17.0)
LYMPHS PCT: 8 % — AB (ref 12–46)
Lymphs Abs: 0.6 10*3/uL — ABNORMAL LOW (ref 0.7–4.0)
MCH: 32.2 pg (ref 26.0–34.0)
MCHC: 32.8 g/dL (ref 30.0–36.0)
MCV: 98.3 fL (ref 78.0–100.0)
MONO ABS: 0.6 10*3/uL (ref 0.1–1.0)
Monocytes Relative: 8 % (ref 3–12)
Neutro Abs: 5.9 10*3/uL (ref 1.7–7.7)
Neutrophils Relative %: 83 % — ABNORMAL HIGH (ref 43–77)
Platelets: 138 10*3/uL — ABNORMAL LOW (ref 150–400)
RBC: 2.92 MIL/uL — ABNORMAL LOW (ref 4.22–5.81)
RDW: 12.2 % (ref 11.5–15.5)
WBC: 7.1 10*3/uL (ref 4.0–10.5)

## 2015-05-27 LAB — GLUCOSE, CAPILLARY
GLUCOSE-CAPILLARY: 141 mg/dL — AB (ref 65–99)
GLUCOSE-CAPILLARY: 126 mg/dL — AB (ref 65–99)
GLUCOSE-CAPILLARY: 111 mg/dL — AB (ref 65–99)
GLUCOSE-CAPILLARY: 127 mg/dL — AB (ref 65–99)
Glucose-Capillary: 134 mg/dL — ABNORMAL HIGH (ref 65–99)
Glucose-Capillary: 135 mg/dL — ABNORMAL HIGH (ref 65–99)
Glucose-Capillary: 97 mg/dL (ref 65–99)

## 2015-05-27 LAB — TRIGLYCERIDES: TRIGLYCERIDES: 42 mg/dL (ref <150)

## 2015-05-27 MED ORDER — HYDROMORPHONE HCL 1 MG/ML IJ SOLN
2.0000 mg | INTRAMUSCULAR | Status: DC | PRN
  Administered 2015-05-27 – 2015-06-02 (×21): 2 mg via INTRAVENOUS
  Filled 2015-05-27 (×21): qty 2

## 2015-05-27 MED ORDER — ENOXAPARIN SODIUM 40 MG/0.4ML ~~LOC~~ SOLN
40.0000 mg | SUBCUTANEOUS | Status: DC
  Administered 2015-05-27 – 2015-06-03 (×7): 40 mg via SUBCUTANEOUS
  Filled 2015-05-27 (×8): qty 0.4

## 2015-05-27 MED ORDER — WHITE PETROLATUM GEL
Status: AC
  Administered 2015-05-27: 22:00:00
  Filled 2015-05-27: qty 1

## 2015-05-27 MED ORDER — PHENOL 1.4 % MT LIQD: 1.0000 | OROMUCOSAL | Status: DC | PRN
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
## 2015-05-27 NOTE — Clinical Social Work Note (Signed)
Clinical Social Worker attempted to meet with patient at bedside to complete full assessment.  Patient has just been extubated and is complaining of severe pain with a hoarse/raspy voice.  Patient does state that he lives home alone.  Patient scheduled for surgery next week with Dr. Trenton Gammon.  CSW to complete full assessment with patient once medically appropriate.  Barbette Or, [place]

## 2015-05-27 NOTE — Progress Notes (Signed)
Pt intubated and stable - foot splinted Elevate - will change splint next week

## 2015-05-27 NOTE — Progress Notes (Signed)
Patient now extubated. Minimally verbal secondary to some vocal cord trauma with the intubation which apparently will clear. Patient appears aware and appears to understand.  Neurologic exam patient is awake and alert. He appears oriented and appropriate. Cranial nerve function is intact. Motor examination extremities reveals 4+ over 5 deltoid and biceps strength bilaterally. 3 over 5 wrist extensor strength bilaterally. 2 over 5 right triceps strength and one over 5 left triceps strength. No grips no intrinsics no lower extremity voluntary movement.  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 I discussed situation with patient. I think we will move forward on Monday afternoon with C3-C7 decompressive laminectomy with posterior cervical fusion utilizing lateral mass instrumentation and local bone grafting. I discussed the risks involved with surgery including but not limited to the risk of anesthesia, bleeding, infection, CSF leak, nerve root injury, spinal cord injury, fusion failure, instrumentation failure, continued pain, and non-benefit. The patient has been given the opportunity to ask questions. He appears to understand. He wishes to proceed with surgery.

## 2015-05-27 NOTE — Procedures (Signed)
Extubation Procedure Note  Patient Details:   Name: Mahkai Fangman DOB: [birth year] MRN: [id number]   Patient was extubated to 4 L [place] per order by MD. Patient voiced his name and no stridor noted. RT will continue to monitor.     Evaluation  O2 sats: stable throughout Complications: No apparent complications Patient did tolerate procedure well. Bilateral Breath Sounds: Rhonchi, Diminished Suctioning: Airway, Oral Yes  [name] 05/27/2015, 9:14 AM
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
## 2015-05-27 NOTE — Plan of Care (Signed)
Problem: SLP Dysphagia Goals Goal: Patient will demonstrate readiness for PO's Patient will demonstrate readiness for PO's and/or instrumental swallow study as evidenced by: Ability to demonstrate audible phonation in 75% of attempts with min assist.
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
## 2015-05-28 ENCOUNTER — Inpatient Hospital Stay (HOSPITAL_COMMUNITY): Payer: 59

## 2015-05-28 LAB — CBC
HEMATOCRIT: 26.4 % — AB (ref 39.0–52.0)
Hemoglobin: 8.6 g/dL — ABNORMAL LOW (ref 13.0–17.0)
MCH: 31.7 pg (ref 26.0–34.0)
MCHC: 32.6 g/dL (ref 30.0–36.0)
MCV: 97.4 fL (ref 78.0–100.0)
Platelets: 181 10*3/uL (ref 150–400)
RBC: 2.71 MIL/uL — ABNORMAL LOW (ref 4.22–5.81)
RDW: 12 % (ref 11.5–15.5)
WBC: 6.6 10*3/uL (ref 4.0–10.5)

## 2015-05-28 LAB — GLUCOSE, CAPILLARY
GLUCOSE-CAPILLARY: 106 mg/dL — AB (ref 65–99)
Glucose-Capillary: 110 mg/dL — ABNORMAL HIGH (ref 65–99)

## 2015-05-28 LAB — BASIC METABOLIC PANEL
Anion gap: 5 (ref 5–15)
BUN: 5 mg/dL — AB (ref 6–20)
CHLORIDE: 108 mmol/L (ref 101–111)
CO2: 28 mmol/L (ref 22–32)
Calcium: 7.8 mg/dL — ABNORMAL LOW (ref 8.9–10.3)
Creatinine, Ser: 0.62 mg/dL (ref 0.61–1.24)
GFR calc Af Amer: >60 mL/min (ref >60)
GFR calc non Af Amer: >60 mL/min (ref >60)
Glucose, Bld: 92 mg/dL (ref 65–99)
Potassium: 3.2 mmol/L — ABNORMAL LOW (ref 3.5–5.1)
Sodium: 141 mmol/L (ref 135–145)

## 2015-05-28 MED ORDER — POTASSIUM CHLORIDE 10 MEQ/100ML IV SOLN
10.0000 meq | INTRAVENOUS | Status: AC
  Administered 2015-05-28 (×4): 10 meq via INTRAVENOUS
  Filled 2015-05-28 (×4): qty 100
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
## 2015-05-28 NOTE — Progress Notes (Signed)
MBS report now available under imaging section  Greg Cratty Sumney MA, CCC-SLP Acute Care Speech Language Pathologist    
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
## 2015-05-28 NOTE — Progress Notes (Signed)
Pt is being taken off the unit to have a Barium swallowing exam by transporter Jacklynn Lewis E.  Dr. Grandville Silos gave the order for pt to leave the unit without the RN.  Ashlinn Hemrick GARNER

## 2015-05-28 NOTE — Plan of Care (Signed)
Problem: Phase III Progression Outcomes Goal: Activity at appropriate level-compared to baseline (UP IN CHAIR FOR HEMODIALYSIS)  Outcome: Not Met (add Reason) Bedrest orders Goal: IV/normal saline lock discontinued Outcome: Not Met (add Reason) Fluids for hydration Goal: Foley discontinued Outcome: Not Met (add Reason) Spinal precautions

## 2015-05-28 NOTE — Plan of Care (Signed)
Problem: Phase II Progression Outcomes Goal: Progress activity as tolerated unless otherwise ordered Outcome: Progressing Pt worked with PT today, dangled on side of bed. Was exercising Left arm on his own to try "to build up strength" Goal: Vital signs remain stable Outcome: Completed/Met Date Met:  05/28/15 VS remain stable for this shift and since admission     Problem: Phase III Progression Outcomes Goal: Activity at appropriate level-compared to baseline (UP IN CHAIR FOR HEMODIALYSIS)  Outcome: Progressing Worked with PT today  Problem: Discharge Progression Outcomes Goal: Tolerating diet Outcome: Progressing Pt was able to eat 50% of his meal this afternoon.

## 2015-05-29 LAB — BASIC METABOLIC PANEL
Anion gap: 5 (ref 5–15)
BUN: 5 mg/dL — AB (ref 6–20)
CO2: 29 mmol/L (ref 22–32)
CREATININE: 0.48 mg/dL — AB (ref 0.61–1.24)
Calcium: 8.2 mg/dL — ABNORMAL LOW (ref 8.9–10.3)
Chloride: 105 mmol/L (ref 101–111)
GFR calc non Af Amer: >60 mL/min (ref >60)
GLUCOSE: 125 mg/dL — AB (ref 65–99)
Potassium: 3.8 mmol/L (ref 3.5–5.1)
SODIUM: 139 mmol/L (ref 135–145)

## 2015-05-29 LAB — MAGNESIUM: Magnesium: 1.9 mg/dL (ref 1.7–2.4)

## 2015-05-29 MED ORDER — DIPHENHYDRAMINE HCL 50 MG/ML IJ SOLN
12.5000 mg | Freq: Every evening | INTRAMUSCULAR | Status: DC | PRN
  Administered 2015-05-30: 12.5 mg via INTRAVENOUS
  Filled 2015-05-29: qty 1

## 2015-05-29 MED ORDER — ADULT MULTIVITAMIN W/MINERALS CH
1.0000 | ORAL_TABLET | Freq: Every day | ORAL | Status: DC
  Administered 2015-05-29 – 2015-06-03 (×5): 1 via ORAL
  Filled 2015-05-29 (×6): qty 1

## 2015-05-29 MED ORDER — CEFAZOLIN SODIUM-DEXTROSE 2-3 GM-% IV SOLR
2.0000 g | INTRAVENOUS | Status: DC
  Filled 2015-05-29: qty 50

## 2015-05-29 MED ORDER — DEXAMETHASONE SODIUM PHOSPHATE 10 MG/ML IJ SOLN: 10.0000 mg | INTRAMUSCULAR | Status: DC

## 2015-05-29 MED ORDER — PANTOPRAZOLE SODIUM 40 MG PO TBEC
40.0000 mg | DELAYED_RELEASE_TABLET | Freq: Every day | ORAL | Status: DC
  Administered 2015-05-29 – 2015-06-02 (×4): 40 mg via ORAL
  Filled 2015-05-29 (×4): qty 1
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
## 2015-05-29 NOTE — Progress Notes (Signed)
Patient ID: Phillip [name], male   DOB: 01/13/1966, 49 y.o.   MRN: [id number] Right short leg splint intact no drainage post ORIF by Dr. Marlou Sa. Splint change this week per Dr. Forbes Cellar note.
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
## 2015-05-29 NOTE — Clinical Social Work Note (Signed)
Clinical Social Work Assessment  Patient Details  Name: Mann Skaggs MRN: [id number] Date of Birth: [birth year]  Date of referral:  05/27/15               Reason for consult:  Discharge Planning, Substance Use/ETOH Abuse                Permission sought to share information with:  Family Supports ([facility]) Permission granted to share information::  Yes, Verbal Permission Granted  Name::        Agency::     Relationship::     Contact Information:     Housing/Transportation Living arrangements for the past 2 months:  Apartment Source of Information:  Patient (and sister) Patient Interpreter Needed:  None Criminal Activity/Legal Involvement Pertinent to Current Situation/Hospitalization:  No - Comment as needed Significant Relationships:  Siblings Lives with:  Self Do you feel safe going back to the place where you live?  No Need for family participation in patient care:  Yes (Comment)  Care giving concerns:  Patient currently lives alone but agrees with need for skilled nursing for short term rehab. Patient stated that he was hoping for inpatient rehabilitiation and CSW made it clear that that may be an option but CSW was providing SNF assessment in case an alternative was necessary. Patient's sister stated that she was concerned about patient options because he does not have any insurance. CSW explained thaf financial counseling would initiate those services in order to support medicaid application and [employer] for uninsured patients. Sister identified that Financial Counseling had already been there and has started necessary application processes.    Social Worker assessment / plan:  CSW provided list of SNF providers. CSW also shared with patient and sister that there are some facilities that will accept patients with no insurance but that may limit options until Medicaid is approved. CSW will submit to [facility] in order to see if anyone will make an offer on  a Medicaid pending patient.   Employment status:    Insurance information:  Self Pay (Medicaid Pending) PT Recommendations:  Inpatient Rehab Consult Information / Referral to community resources:  [place]  Patient/Family's Response to care:  Patient and sister were accepting of plan and agreeable to SNF if he is not able to go to CIR.  Patient/Family's Understanding of and Emotional Response to Diagnosis, Current Treatment, and Prognosis: Patient also identified that he wanted to stop drinking. :Patient stated that he was very confident that he would stop drinking on his own. CSW reinforced the importance of support, but patient refused.  Weekday CSW will continue to follow.   Emotional Assessment Appearance:  Appears stated age Attitude/Demeanor/Rapport:   (pleasant, open) Affect (typically observed):  Appropriate Orientation:  Oriented to Self, Oriented to Place, Oriented to  Time, Oriented to Situation Alcohol / Substance use:  Alcohol Use Psych involvement (Current and /or in the community):  No (Comment)  Discharge Needs  Concerns to be addressed:  Financial / Insurance Concerns Readmission within the last 30 days:  No Current discharge risk:  Substance Abuse Barriers to Discharge:  No Barriers Identified   Dam Ashraf, Daneil Dolin, LCSW 05/29/2015, 3:08 PM

## 2015-05-30 ENCOUNTER — Encounter (HOSPITAL_COMMUNITY): Admission: EM | Disposition: A | Discharge status: Skilled Nursing Facility | Payer: Self-pay | Source: Home / Self Care

## 2015-05-30 ENCOUNTER — Inpatient Hospital Stay (HOSPITAL_COMMUNITY): Payer: 59

## 2015-05-30 ENCOUNTER — Encounter (HOSPITAL_COMMUNITY): Payer: Self-pay | Admitting: Certified Registered Nurse Anesthetist

## 2015-05-30 ENCOUNTER — Inpatient Hospital Stay (HOSPITAL_COMMUNITY): Payer: 59 | Admitting: Anesthesiology

## 2015-05-30 HISTORY — PX: POSTERIOR CERVICAL FUSION/FORAMINOTOMY: SHX5038

## 2015-05-30 LAB — BASIC METABOLIC PANEL
Anion gap: 7 (ref 5–15)
BUN: <5 mg/dL — ABNORMAL LOW (ref 6–20)
CALCIUM: 8.5 mg/dL — AB (ref 8.9–10.3)
CO2: 30 mmol/L (ref 22–32)
Chloride: 100 mmol/L — ABNORMAL LOW (ref 101–111)
Creatinine, Ser: 0.61 mg/dL (ref 0.61–1.24)
GFR calc Af Amer: >60 mL/min (ref >60)
Glucose, Bld: 150 mg/dL — ABNORMAL HIGH (ref 65–99)
POTASSIUM: 4.1 mmol/L (ref 3.5–5.1)
SODIUM: 137 mmol/L (ref 135–145)

## 2015-05-30 LAB — CBC
HCT: 27.5 % — ABNORMAL LOW (ref 39.0–52.0)
HEMOGLOBIN: 9.6 g/dL — AB (ref 13.0–17.0)
MCH: 32.1 pg (ref 26.0–34.0)
MCHC: 34.9 g/dL (ref 30.0–36.0)
MCV: 92 fL (ref 78.0–100.0)
PLATELETS: 336 10*3/uL (ref 150–400)
RBC: 2.99 MIL/uL — AB (ref 4.22–5.81)
RDW: 11.5 % (ref 11.5–15.5)
WBC: 4.6 10*3/uL (ref 4.0–10.5)

## 2015-05-30 LAB — TYPE AND SCREEN
ABO/RH(D): O POS
Antibody Screen: NEGATIVE

## 2015-05-30 SURGERY — POSTERIOR CERVICAL FUSION/FORAMINOTOMY LEVEL 4
Anesthesia: General

## 2015-05-30 MED ORDER — THROMBIN 5000 UNITS EX SOLR
CUTANEOUS | Status: DC | PRN
  Administered 2015-05-30 (×2): 5000 [IU] via TOPICAL

## 2015-05-30 MED ORDER — VECURONIUM BROMIDE 10 MG IV SOLR
INTRAVENOUS | Status: DC | PRN
  Administered 2015-05-30 (×2): .5 mg via INTRAVENOUS

## 2015-05-30 MED ORDER — PROPOFOL 10 MG/ML IV BOLUS
INTRAVENOUS | Status: DC | PRN
  Administered 2015-05-30: 150 mg via INTRAVENOUS
  Administered 2015-05-30: 50 mg via INTRAVENOUS

## 2015-05-30 MED ORDER — LACTATED RINGERS IV SOLN
INTRAVENOUS | Status: DC | PRN
  Administered 2015-05-30: 14:00:00 via INTRAVENOUS

## 2015-05-30 MED ORDER — ROCURONIUM BROMIDE 100 MG/10ML IV SOLN
INTRAVENOUS | Status: DC | PRN
  Administered 2015-05-30: 10 mg via INTRAVENOUS
  Administered 2015-05-30: 40 mg via INTRAVENOUS

## 2015-05-30 MED ORDER — MIDAZOLAM HCL 2 MG/2ML IJ SOLN
INTRAMUSCULAR | Status: AC
  Filled 2015-05-30: qty 2

## 2015-05-30 MED ORDER — HEMOSTATIC AGENTS (NO CHARGE) OPTIME
TOPICAL | Status: DC | PRN
  Administered 2015-05-30: 1 via TOPICAL

## 2015-05-30 MED ORDER — PROMETHAZINE HCL 25 MG/ML IJ SOLN: 6.2500 mg | INTRAMUSCULAR | Status: DC | PRN

## 2015-05-30 MED ORDER — ONDANSETRON HCL 4 MG/2ML IJ SOLN
INTRAMUSCULAR | Status: AC
  Filled 2015-05-30: qty 2

## 2015-05-30 MED ORDER — PHENYLEPHRINE HCL 10 MG/ML IJ SOLN
10.0000 mg | INTRAVENOUS | Status: DC | PRN
  Administered 2015-05-30: 20 ug/min via INTRAVENOUS

## 2015-05-30 MED ORDER — DEXAMETHASONE SODIUM PHOSPHATE 10 MG/ML IJ SOLN
10.0000 mg | INTRAMUSCULAR | Status: AC
  Administered 2015-05-30: 10 mg via INTRAVENOUS
  Filled 2015-05-30: qty 1

## 2015-05-30 MED ORDER — OXYCODONE HCL 5 MG/5ML PO SOLN: 5.0000 mg | Freq: Once | ORAL | Status: DC | PRN

## 2015-05-30 MED ORDER — HYDROMORPHONE HCL 1 MG/ML IJ SOLN
INTRAMUSCULAR | Status: AC
  Administered 2015-05-30: 0.5 mg via INTRAVENOUS
  Filled 2015-05-30: qty 1

## 2015-05-30 MED ORDER — ARTIFICIAL TEARS OP OINT
TOPICAL_OINTMENT | OPHTHALMIC | Status: DC | PRN
  Administered 2015-05-30: 1 via OPHTHALMIC

## 2015-05-30 MED ORDER — BACITRACIN ZINC 500 UNIT/GM EX OINT
TOPICAL_OINTMENT | CUTANEOUS | Status: DC | PRN
  Administered 2015-05-30: 1 via TOPICAL

## 2015-05-30 MED ORDER — MIDAZOLAM HCL 5 MG/5ML IJ SOLN
INTRAMUSCULAR | Status: DC | PRN
  Administered 2015-05-30: 2 mg via INTRAVENOUS

## 2015-05-30 MED ORDER — SODIUM CHLORIDE 0.9 % IR SOLN
Status: DC | PRN
  Administered 2015-05-30: 500 mL

## 2015-05-30 MED ORDER — HYDROMORPHONE HCL 1 MG/ML IJ SOLN
0.2500 mg | INTRAMUSCULAR | Status: DC | PRN
  Administered 2015-05-30 (×4): 0.5 mg via INTRAVENOUS

## 2015-05-30 MED ORDER — LIDOCAINE HCL (CARDIAC) 20 MG/ML IV SOLN
INTRAVENOUS | Status: DC | PRN
  Administered 2015-05-30: 80 mg via INTRAVENOUS
  Administered 2015-05-30: 60 mg via INTRATRACHEAL

## 2015-05-30 MED ORDER — CEFAZOLIN SODIUM-DEXTROSE 2-3 GM-% IV SOLR
2.0000 g | INTRAVENOUS | Status: AC
  Administered 2015-05-30: 2 g via INTRAVENOUS
  Filled 2015-05-30 (×2): qty 50

## 2015-05-30 MED ORDER — BUPIVACAINE HCL (PF) 0.25 % IJ SOLN
INTRAMUSCULAR | Status: DC | PRN
  Administered 2015-05-30: 10 mL

## 2015-05-30 MED ORDER — GLYCOPYRROLATE 0.2 MG/ML IJ SOLN
INTRAMUSCULAR | Status: DC | PRN
  Administered 2015-05-30: 0.6 mg via INTRAVENOUS

## 2015-05-30 MED ORDER — 0.9 % SODIUM CHLORIDE (POUR BTL) OPTIME
TOPICAL | Status: DC | PRN
  Administered 2015-05-30: 1000 mL

## 2015-05-30 MED ORDER — VANCOMYCIN HCL 1000 MG IV SOLR
INTRAVENOUS | Status: AC
  Filled 2015-05-30: qty 1000

## 2015-05-30 MED ORDER — LIDOCAINE HCL (CARDIAC) 20 MG/ML IV SOLN
INTRAVENOUS | Status: AC
  Filled 2015-05-30: qty 10

## 2015-05-30 MED ORDER — SODIUM CHLORIDE 0.9 % IV SOLN: Freq: Once | INTRAVENOUS | Status: DC

## 2015-05-30 MED ORDER — BISACODYL 10 MG RE SUPP: 10.0000 mg | Freq: Every day | RECTAL | Status: DC | PRN

## 2015-05-30 MED ORDER — FENTANYL CITRATE (PF) 250 MCG/5ML IJ SOLN
INTRAMUSCULAR | Status: AC
  Filled 2015-05-30: qty 5

## 2015-05-30 MED ORDER — OXYCODONE HCL 5 MG PO TABS: 5.0000 mg | ORAL_TABLET | Freq: Once | ORAL | Status: DC | PRN

## 2015-05-30 MED ORDER — FENTANYL CITRATE (PF) 100 MCG/2ML IJ SOLN
INTRAMUSCULAR | Status: DC | PRN
  Administered 2015-05-30: 100 ug via INTRAVENOUS
  Administered 2015-05-30: 50 ug via INTRAVENOUS

## 2015-05-30 MED ORDER — CEFAZOLIN SODIUM-DEXTROSE 2-3 GM-% IV SOLR
2.0000 g | Freq: Three times a day (TID) | INTRAVENOUS | Status: AC
  Administered 2015-05-30 – 2015-05-31 (×3): 2 g via INTRAVENOUS
  Filled 2015-05-30 (×3): qty 50

## 2015-05-30 MED ORDER — VECURONIUM BROMIDE 10 MG IV SOLR
INTRAVENOUS | Status: AC
  Filled 2015-05-30: qty 10

## 2015-05-30 MED ORDER — LACTATED RINGERS IV SOLN
INTRAVENOUS | Status: DC | PRN
  Administered 2015-05-30 (×2): via INTRAVENOUS

## 2015-05-30 MED ORDER — PROPOFOL 10 MG/ML IV BOLUS
INTRAVENOUS | Status: AC
  Filled 2015-05-30: qty 20

## 2015-05-30 MED ORDER — ENSURE ENLIVE PO LIQD
237.0000 mL | Freq: Two times a day (BID) | ORAL | Status: DC
  Administered 2015-05-31 – 2015-06-03 (×8): 237 mL via ORAL

## 2015-05-30 MED ORDER — POLYETHYLENE GLYCOL 3350 17 G PO PACK
17.0000 g | PACK | Freq: Every day | ORAL | Status: DC
  Administered 2015-05-31 – 2015-06-03 (×4): 17 g via ORAL
  Filled 2015-05-30 (×4): qty 1

## 2015-05-30 MED ORDER — NEOSTIGMINE METHYLSULFATE 10 MG/10ML IV SOLN
INTRAVENOUS | Status: DC | PRN
  Administered 2015-05-30: 4 mg via INTRAVENOUS

## 2015-05-30 MED ORDER — LIDOCAINE HCL 4 % MT SOLN
OROMUCOSAL | Status: DC | PRN
  Administered 2015-05-30: 4 mL via TOPICAL

## 2015-05-30 MED ORDER — ROCURONIUM BROMIDE 50 MG/5ML IV SOLN
INTRAVENOUS | Status: AC
  Filled 2015-05-30: qty 1

## 2015-05-30 MED ORDER — GLYCOPYRROLATE 0.2 MG/ML IJ SOLN
INTRAMUSCULAR | Status: AC
  Filled 2015-05-30: qty 3

## 2015-05-30 SURGICAL SUPPLY — 59 items
APL SKNCLS STERI-STRIP NONHPOA (GAUZE/BANDAGES/DRESSINGS) ×1
BAG DECANTER FOR FLEXI CONT (MISCELLANEOUS) ×3 IMPLANT
BENZOIN TINCTURE PRP APPL 2/3 (GAUZE/BANDAGES/DRESSINGS) ×3 IMPLANT
BIT DRILL 2.4X (BIT) IMPLANT
BIT DRL 2.4X (BIT) ×1
BLADE CLIPPER SURG (BLADE) ×2 IMPLANT
BRUSH SCRUB EZ PLAIN DRY (MISCELLANEOUS) ×3 IMPLANT
BUR MATCHSTICK NEURO 3.0 LAGG (BURR) ×3 IMPLANT
CANISTER SUCT 3000ML PPV (MISCELLANEOUS) ×3 IMPLANT
CLOSURE WOUND 1/2 X4 (GAUZE/BANDAGES/DRESSINGS) ×1
CONT SPEC 4OZ CLIKSEAL STRL BL (MISCELLANEOUS) ×3 IMPLANT
DRAPE C-ARM 42X72 X-RAY (DRAPES) ×4 IMPLANT
DRAPE LAPAROTOMY 100X72 PEDS (DRAPES) ×3 IMPLANT
DRAPE POUCH INSTRU U-SHP 10X18 (DRAPES) ×3 IMPLANT
DRILL BIT (BIT) ×3
DRSG OPSITE POSTOP 4X6 (GAUZE/BANDAGES/DRESSINGS) ×2 IMPLANT
DURAPREP 26ML APPLICATOR (WOUND CARE) ×3 IMPLANT
ELECT REM PT RETURN 9FT ADLT (ELECTROSURGICAL) ×3
ELECTRODE REM PT RTRN 9FT ADLT (ELECTROSURGICAL) ×1 IMPLANT
EVACUATOR 1/8 PVC DRAIN (DRAIN) IMPLANT
GAUZE SPONGE 4X4 12PLY STRL (GAUZE/BANDAGES/DRESSINGS) ×3 IMPLANT
GAUZE SPONGE 4X4 16PLY XRAY LF (GAUZE/BANDAGES/DRESSINGS) IMPLANT
GLOVE ECLIPSE 9.0 STRL (GLOVE) ×3 IMPLANT
GLOVE EXAM NITRILE LRG STRL (GLOVE) IMPLANT
GLOVE EXAM NITRILE MD LF STRL (GLOVE) IMPLANT
GLOVE EXAM NITRILE XL STR (GLOVE) IMPLANT
GLOVE EXAM NITRILE XS STR PU (GLOVE) IMPLANT
GOWN STRL REUS W/ TWL LRG LVL3 (GOWN DISPOSABLE) IMPLANT
GOWN STRL REUS W/ TWL XL LVL3 (GOWN DISPOSABLE) ×1 IMPLANT
GOWN STRL REUS W/TWL 2XL LVL3 (GOWN DISPOSABLE) IMPLANT
GOWN STRL REUS W/TWL LRG LVL3 (GOWN DISPOSABLE) ×3
GOWN STRL REUS W/TWL XL LVL3 (GOWN DISPOSABLE) ×6
KIT BASIN OR (CUSTOM PROCEDURE TRAY) ×3 IMPLANT
KIT ROOM TURNOVER OR (KITS) ×3 IMPLANT
LIQUID BAND (GAUZE/BANDAGES/DRESSINGS) ×3 IMPLANT
NDL SPNL 22GX3.5 QUINCKE BK (NEEDLE) ×1 IMPLANT
NEEDLE HYPO 22GX1.5 SAFETY (NEEDLE) ×3 IMPLANT
NEEDLE SPNL 22GX3.5 QUINCKE BK (NEEDLE) ×3 IMPLANT
NS IRRIG 1000ML POUR BTL (IV SOLUTION) ×3 IMPLANT
PACK LAMINECTOMY NEURO (CUSTOM PROCEDURE TRAY) ×3 IMPLANT
PAD ARMBOARD 7.5X6 YLW CONV (MISCELLANEOUS) ×9 IMPLANT
PIN MAYFIELD SKULL DISP (PIN) ×3 IMPLANT
ROD VERTEX SPINAL (Rod) ×6 IMPLANT
ROD VERTEX SPN 70X3.5XPCUT NS (Rod) IMPLANT
SCREW 3.5X12 (Screw) ×6 IMPLANT
SCREW BN 12X3.5XMA SPNE (Screw) IMPLANT
SCREW CANCELLOUS 3.5X14MM (Screw) ×16 IMPLANT
SCREW SET M6 (Screw) ×20 IMPLANT
SPONGE LAP 4X18 X RAY DECT (DISPOSABLE) IMPLANT
SPONGE SURGIFOAM ABS GEL SZ50 (HEMOSTASIS) ×3 IMPLANT
STRIP CLOSURE SKIN 1/2X4 (GAUZE/BANDAGES/DRESSINGS) ×2 IMPLANT
SUT VIC AB 0 CT1 18XCR BRD8 (SUTURE) ×1 IMPLANT
SUT VIC AB 0 CT1 8-18 (SUTURE) ×3
SUT VIC AB 2-0 CT1 18 (SUTURE) ×3 IMPLANT
SUT VIC AB 3-0 SH 8-18 (SUTURE) ×3 IMPLANT
SYR 20ML ECCENTRIC (SYRINGE) ×3 IMPLANT
TOWEL OR 17X24 6PK STRL BLUE (TOWEL DISPOSABLE) ×3 IMPLANT
TOWEL OR 17X26 10 PK STRL BLUE (TOWEL DISPOSABLE) ×3 IMPLANT
WATER STERILE IRR 1000ML POUR (IV SOLUTION) ×3 IMPLANT
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
## 2015-05-30 NOTE — Transfer of Care (Signed)
Immediate Anesthesia Transfer of Care Note  Patient: Phillip [name]  Procedure(s) Performed: Procedure(s): C3-C7 decompressive laminectomy with posterior cervical fusion utilizing lateral mass instrumentation and local bone grafting (N/A)  Patient Location: PACU  Anesthesia Type:General  Level of Consciousness: awake, alert  and oriented  Airway & Oxygen Therapy: Patient Spontanous Breathing and Patient connected to nasal cannula oxygen  Post-op Assessment: Report given to RN and Post -op Vital signs reviewed and stable  Post vital signs: Reviewed and stable  Last Vitals:  Filed Vitals:   05/30/15 1300  BP: 181/91  Pulse: 82  Temp:   Resp: 11    Complications: No apparent anesthesia complications

## 2015-05-30 NOTE — Progress Notes (Signed)
OT Cancellation Note  Patient Details Name: Phillip [name] MRN: [id number] DOB: 09/28/1966   Cancelled Treatment:    Reason Eval/Treat Not Completed: Other (comment). Pt for surgery today, will await to eval pt post op.  Almon Register [phone] 05/30/2015, 1:37 PM
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
## 2015-05-30 NOTE — Brief Op Note (Signed)
05/23/2015 - 05/30/2015  4:21 PM  PATIENT:  [name]  49 y.o. male  PRE-OPERATIVE DIAGNOSIS:  spondylosis - spinal cord injury  POST-OPERATIVE DIAGNOSIS:  spondylosis - spinal cord injury  PROCEDURE:  Procedure(s): C3-C7 decompressive laminectomy with posterior cervical fusion utilizing lateral mass instrumentation and local bone grafting (N/A)  SURGEON:  Surgeon(s) and Role:    * Earnie Larsson, MD - Primary    * Jovita Gamma, MD - Assisting  PHYSICIAN ASSISTANT:   ASSISTANTS:    ANESTHESIA:   general  EBL:  Total I/O In: 1900 [I.V.:1900] Out: 2335 [Urine:2085; Blood:250]  BLOOD ADMINISTERED:none  DRAINS: (Medium) Hemovact drain(s) in the Epidural  space with  Suction Open   LOCAL MEDICATIONS USED:  MARCAINE     SPECIMEN:  No Specimen  DISPOSITION OF SPECIMEN:  N/A  COUNTS:  YES  TOURNIQUET:  * No tourniquets in log *  DICTATION: .Dragon Dictation  PLAN OF CARE: Admit to inpatient   PATIENT DISPOSITION:  PACU - hemodynamically stable.   Delay start of Pharmacological VTE agent (>24hrs) due to surgical blood loss or risk of bleeding: yes

## 2015-05-30 NOTE — Anesthesia Preprocedure Evaluation (Addendum)
Anesthesia Evaluation  Patient identified by MRN, date of birth, ID band Patient awake    Reviewed: Allergy & Precautions, NPO status , Patient's Chart, lab work & pertinent test results  Airway Mallampati: I  TM Distance: >3 FB Neck ROM: Full    Dental  (+) Dental Advisory Given, Missing, Poor Dentition   Pulmonary Current Smoker,  breath sounds clear to auscultation        Cardiovascular negative cardio ROS  Rhythm:Regular Rate:Normal     Neuro/Psych Seizures -,  Spinal cord injury following [facility]    GI/Hepatic negative GI ROS, Neg liver ROS, (+)     substance abuse  alcohol use,   Endo/Other  negative endocrine ROS  Renal/GU negative Renal ROS     Musculoskeletal negative musculoskeletal ROS (+)   Abdominal   Peds  Hematology  (+) anemia ,   Anesthesia Other Findings   Reproductive/Obstetrics                           Anesthesia Physical Anesthesia Plan  ASA: III  Anesthesia Plan: General   Post-op Pain Management:    Induction: Intravenous  Airway Management Planned: Oral ETT and Video Laryngoscope Planned  Additional Equipment:   Intra-op Plan:   Post-operative Plan: Extubation in OR  Informed Consent: I have reviewed the patients History and Physical, chart, labs and discussed the procedure including the risks, benefits and alternatives for the proposed anesthesia with the patient or authorized representative who has indicated his/her understanding and acceptance.   Dental advisory given  Plan Discussed with: CRNA  Anesthesia Plan Comments:        Anesthesia Quick Evaluation

## 2015-05-30 NOTE — Progress Notes (Signed)
SLP Cancellation Note  Patient Details Name: Phillip [name] MRN: [id number] DOB: May 23, 1966   Cancelled treatment:       Reason Eval/Treat Not Completed: Other (comment) (Patient NPO for surgery today. Will f/u 6/21. )  Gabriel Rainwater [place], CCC-SLP [phone]  [name] Meryl 05/30/2015, 8:39 AM

## 2015-05-30 NOTE — Progress Notes (Signed)
Rehab Admissions Coordinator Note:  Patient was screened by Cleatrice Burke for appropriateness for an Inpatient Acute Rehab Consult per PT recommendation.   At this time, we are recommending Inpatient Rehab consult after surgery today. Noted limited caregiver support. May need SNF.  Cleatrice Burke 05/30/2015, 8:10 AM  I can be reached at [phone].

## 2015-05-30 NOTE — Progress Notes (Signed)
PT Cancellation Note  Patient Details Name: Phillip [name] MRN: [id number] DOB: [birth year]   Cancelled Treatment:    Reason Eval/Treat Not Completed: Other (comment) (pt going for cervical fusion surgery). 05/30/2015  Phillip [name], Phillip [name] [phone]  (pager)   Phillip [name], Phillip [name] 05/30/2015, 3:21 PM

## 2015-05-30 NOTE — Progress Notes (Signed)
Patient presents today for C3-C7 posterior cervical decompression and fusion for treatment of his severe cervical stenosis with spinal cord injury following trauma

## 2015-05-30 NOTE — Progress Notes (Signed)
Patient ID: Phillip [name], male   DOB: 07-25-1966, 49 y.o.   MRN: [id number] 4 Days Post-Op  Subjective: No new complaints  Objective: Vital signs in last 24 hours: Temp:  [98.2 F (36.8 C)-100.7 F (38.2 C)] 98.9 F (37.2 C) (06/20 0818) Pulse Rate:  [71-100] 81 (06/20 0800) Resp:  [12-26] 12 (06/20 0800) BP: (125-175)/(56-91) 175/91 mmHg (06/20 0818) SpO2:  [86 %-100 %] 98 % (06/20 0800) Weight:  [80.7 kg (177 lb 14.6 oz)] 80.7 kg (177 lb 14.6 oz) (06/20 0500) Last BM Date: 05/28/15  Intake/Output from previous day: 06/19 0701 - 06/20 0700 In: 4080 [P.O.:1680; I.V.:2400] Out: 2375 [Urine:2375] Intake/Output this shift: Total I/O In: 100 [I.V.:100] Out: 275 [Urine:275]  General appearance: cooperative Neck: collar Resp: clear to auscultation bilaterally Cardio: regular rate and rhythm GI: soft, NT Neuro: moving arms, tiny flicker L toe - 1st time I have seen this, No grip B  Lab Results: CBC   Recent Labs  05/28/15 0220  WBC 6.6  HGB 8.6*  HCT 26.4*  PLT 181   BMET Anti-infectives: Anti-infectives    Start     Dose/Rate Route Frequency Ordered Stop   05/30/15 1330  ceFAZolin (ANCEF) IVPB 2 g/50 mL premix     2 g 100 mL/hr over 30 Minutes Intravenous To ShortStay Surgical 05/30/15 0557 05/31/15 1330   05/29/15 1915  ceFAZolin (ANCEF) IVPB 2 g/50 mL premix  Status:  Discontinued     2 g 100 mL/hr over 30 Minutes Intravenous To Surgery 05/29/15 1911 05/30/15 0557   05/27/15 0600  ceFAZolin (ANCEF) IVPB 2 g/50 mL premix  Status:  Discontinued     2 g 100 mL/hr over 30 Minutes Intravenous On call to O.R. 05/26/15 1713 05/26/15 1733   05/26/15 1715  ceFAZolin (ANCEF) IVPB 2 g/50 mL premix     2 g 100 mL/hr over 30 Minutes Intravenous Every 6 hours 05/26/15 1711 05/27/15 0514      Assessment/Plan: [facility] Central cord injury C3-7 - collar, Dr. Annette Stable plans posterior fixation today Resp failure - has done well since extubation Forehead lac FEN - tol PO DYS 3  diet, NPO for OR, add Miralax and dulcolax supp ABL anemia VTE - PAS, Lovenox to be held for [place] - ICU  LOS: 7 days    Georganna Skeans, MD, MPH, FACS Trauma: [phone] General Surgery: [phone]  05/30/2015

## 2015-05-30 NOTE — Anesthesia Procedure Notes (Signed)
Procedure Name: Intubation Date/Time: 05/30/2015 2:26 PM Performed by: Julian Reil Pre-anesthesia Checklist: Patient identified, Emergency Drugs available, Suction available and Patient being monitored Patient Re-evaluated:Patient Re-evaluated prior to inductionOxygen Delivery Method: Circle system utilized Preoxygenation: Pre-oxygenation with 100% oxygen Intubation Type: IV induction Ventilation: Mask ventilation without difficulty Laryngoscope Size: Glidescope and 4 Grade View: Grade I Tube type: Oral Tube size: 7.5 mm Number of attempts: 1 Airway Equipment and Method: Video-laryngoscopy and Stylet Placement Confirmation: ETT inserted through vocal cords under direct vision,  positive ETCO2 and breath sounds checked- equal and bilateral Secured at: 23 cm Tube secured with: Tape Dental Injury: Teeth and Oropharynx as per pre-operative assessment  Comments: Glidescope used d/t severe spondylosis while maintaining neutral C-spine in C-collar. Easy mask ventilation. Grade I view with Glidescope. Teeth as preop.

## 2015-05-31 ENCOUNTER — Encounter (HOSPITAL_COMMUNITY): Payer: Self-pay | Admitting: Neurosurgery

## 2015-05-31 DIAGNOSIS — S14109S Unspecified injury at unspecified level of cervical spinal cord, sequela: Secondary | ICD-10-CM

## 2015-05-31 DIAGNOSIS — G8250 Quadriplegia, unspecified: Secondary | ICD-10-CM

## 2015-05-31 MED ORDER — DEXTROSE-NACL 5-0.9 % IV SOLN: INTRAVENOUS | Status: DC

## 2015-05-31 MED ORDER — WHITE PETROLATUM GEL
Status: AC
  Filled 2015-05-31: qty 1
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
## 2015-05-31 NOTE — Progress Notes (Signed)
Pt received at 10:30am alert, verbal. Pt stable, neuro intact with no noted distress. Cervical collar in place. Pt denies pain or discomfort at this time. Dressing intact. Hemovac in place. Will continue to monitor. Pt oriented to room. Safety measures in place. Call bell within reach. Report received prior to arrival to unit.

## 2015-05-31 NOTE — Progress Notes (Signed)
Postop day 1. Pain well controlled. No new radicular symptoms or pain.  Afebrile. Vital stable. Drain output low. Awake and alert. Oriented and appropriate. Motor and sensory examination stable. Wound clean and dry.  Status post cervical spinal cord injury with approximate C6 functional level. Continue rehabilitation efforts. Mobilize as tolerated.

## 2015-05-31 NOTE — Progress Notes (Signed)
I will follow up with pt and his next of kin tomorrow to assess caregiver support and most appropriate rehab venue options. Noted pt confusion with therapists today. [phone]
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
## 2015-05-31 NOTE — Care Management Note (Signed)
Case Management Note  Patient Details  Name: Phillip [name] MRN: [id number] Date of Birth: [birth year]  Subjective/Objective:   Pt transferred to neuro floor today.  S/P C3-C7 decompression, laminectomy with cervical fusion on 6/20.                  Action/Plan: PT/OT recommending CIR prior to dc home.  Will follow progress.    Expected Discharge Date:  06/06/15               Expected Discharge Plan:  IP Rehab Facility  In-House Referral:  Clinical Social Work  Discharge planning Services  CM Consult  Post Acute Care Choice:    Choice offered to:     DME Arranged:    DME Agency:     HH Arranged:    HH Agency:     Status of Service:  In process, will continue to follow  Medicare Important Message Given:    Date Medicare IM Given:    Medicare IM give by:    Date Additional Medicare IM Given:    Additional Medicare Important Message give by:     If discussed at [place] of Stay Meetings, dates discussed:    Additional Comments:  Reinaldo Raddle, RN, BSN  Trauma/Neuro ICU Case Manager [phone]

## 2015-05-31 NOTE — Progress Notes (Signed)
Patient ID: Phillip [name], male   DOB: October 24, 1966, 49 y.o.   MRN: [id number] 1 Day Post-Op  Subjective: Neck pain earlier but better now  Objective: Vital signs in last 24 hours: Temp:  [97.7 F (36.5 C)-99 F (37.2 C)] 97.7 F (36.5 C) (06/21 0806) Pulse Rate:  [64-89] 89 (06/21 0800) Resp:  [11-23] 14 (06/21 0800) BP: (120-182)/(63-98) 171/95 mmHg (06/21 0700) SpO2:  [92 %-100 %] 98 % (06/21 0800) Weight:  [77.4 kg (170 lb 10.2 oz)] 77.4 kg (170 lb 10.2 oz) (06/21 0500) Last BM Date: 05/28/15  Intake/Output from previous day: 06/20 0701 - 06/21 0700 In: 4150 [P.O.:220; I.V.:3700; IV Piggyback:100] Out: 5360 [Urine:5085; Drains:25; Blood:250] Intake/Output this shift: Total I/O In: 100 [I.V.:100] Out: 550 [Urine:550]  General appearance: alert and cooperative Resp: clear to auscultation bilaterally Cardio: regular rate and rhythm GI: soft, NT  Neuro: no change quadraplegia level  Lab Results: CBC   Recent Labs  05/30/15 1941  WBC 4.6  HGB 9.6*  HCT 27.5*  PLT 336   BMET  Recent Labs  05/29/15 0222 05/30/15 1941  NA 139 137  K 3.8 4.1  CL 105 100*  CO2 29 30  GLUCOSE 125* 150*  BUN 5* <5*  CREATININE 0.48* 0.61  CALCIUM 8.2* 8.5*   PT/INR No results for input(s): LABPROT, INR in the last 72 hours. ABG No results for input(s): PHART, HCO3 in the last 72 hours.  Invalid input(s): PCO2, PO2  Studies/Results: Dg Cervical Spine 1 View  05/30/2015   CLINICAL DATA:  C3-C7 laminectomies.  EXAM: DG CERVICAL SPINE - 1 VIEW  COMPARISON:  Cervical spine CT 05/23/2015  FINDINGS: Lateral views of the cervical spine demonstrate posterior screw and rod fixation from C3-C7. There is mild disc space narrowing at C5-C6 and C6-C7. An endotracheal tube is present.  IMPRESSION: Posterior cervical spine fusion.   Electronically Signed   By: Markus Daft M.D.   On: 05/30/2015 16:20    Anti-infectives: Anti-infectives    Start     Dose/Rate Route Frequency Ordered Stop    05/30/15 2200  ceFAZolin (ANCEF) IVPB 2 g/50 mL premix     2 g 100 mL/hr over 30 Minutes Intravenous 3 times per day 05/30/15 1850 05/31/15 2159   05/30/15 1539  vancomycin (VANCOCIN) 1000 MG powder    Comments:  Felicity Coyer  : cabinet override      05/30/15 1539 05/31/15 0344   05/30/15 1522  bacitracin 50,000 Units in sodium chloride irrigation 0.9 % 500 mL irrigation  Status:  Discontinued       As needed 05/30/15 1522 05/30/15 1637   05/30/15 1330  ceFAZolin (ANCEF) IVPB 2 g/50 mL premix     2 g 100 mL/hr over 30 Minutes Intravenous To ShortStay Surgical 05/30/15 0557 05/30/15 1430   05/29/15 1915  ceFAZolin (ANCEF) IVPB 2 g/50 mL premix  Status:  Discontinued     2 g 100 mL/hr over 30 Minutes Intravenous To Surgery 05/29/15 1911 05/30/15 0557   05/27/15 0600  ceFAZolin (ANCEF) IVPB 2 g/50 mL premix  Status:  Discontinued     2 g 100 mL/hr over 30 Minutes Intravenous On call to O.R. 05/26/15 1713 05/26/15 1733   05/26/15 1715  ceFAZolin (ANCEF) IVPB 2 g/50 mL premix     2 g 100 mL/hr over 30 Minutes Intravenous Every 6 hours 05/26/15 1711 05/27/15 0514      Assessment/Plan: [facility] cord injury C3-7 - collar, S/P C3-7 lam/posterior fusion by Dr.  Pool Resp failure - has done well since extubation Forehead lac FEN - tol PO DYS 3 diet, Miralax and dulcolax supp ABL anemia VTE - PAS, Lovenox Dispo - to floor, CIR consult  LOS: 8 days    Georganna Skeans, MD, MPH, FACS Trauma: [phone] General Surgery: [phone]  05/31/2015
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
## 2015-05-31 NOTE — Progress Notes (Signed)
Patient answering questions appropriately but asking inappropriate questions and showing signs of confusion.  Pt's sister was concerned about a brace for pt's hands so that they would not contract and his fingers would straighten out.    Pt's pain is well controlled at this time. Will continue to monitor closely. Adrieana Fennelly, Rande Brunt, RN

## 2015-06-01 MED ORDER — MAGNESIUM CITRATE PO SOLN
1.0000 | Freq: Once | ORAL | Status: AC
  Administered 2015-06-01: 1 via ORAL
  Filled 2015-06-01: qty 296
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
## 2015-06-01 NOTE — Progress Notes (Signed)
Postop day 2. Overall stable. Pain well controlled. No new radicular symptoms. Neurological exam unchanged. Patient afebrile. Wound healing well.  Progressing recently well. Mobilize as tolerated. Ready for inpatient rehabilitation when bed available.

## 2015-06-01 NOTE — Progress Notes (Signed)
Pt's confusion noted to be increasing per therapy as well as sister, Phillip [name]. I spoke with Phillip [name] by phone to discuss pt's rehab venue options and caregiver support at home. She states she and her sister, Phillip [name], can be involved with rehab facility placement, but all family work and unable to provide 24/7 care for him. They were given a list of facilities last week, but Phillip [name] was unsure of when pt would be ready for d/c. I explained that SNF would be recommended and that I would discuss with SW the plan, so that family could be assisted in planning rehab facility placement. I will alert RN CM and SW. I have placed family contact numbers in chart for ease in communication. [phone]

## 2015-06-01 NOTE — Progress Notes (Signed)
Patient ID: Phillip [name], male   DOB: 24-Dec-1965, 49 y.o.   MRN: [id number] 2 Days Post-Op  Subjective: C/O no BM  Objective: Vital signs in last 24 hours: Temp:  [97.7 F (36.5 C)-99.4 F (37.4 C)] 99.4 F (37.4 C) (06/22 0422) Pulse Rate:  [72-127] 88 (06/22 0422) Resp:  [14-18] 18 (06/22 0422) BP: (115-171)/(54-100) 171/94 mmHg (06/22 0422) SpO2:  [94 %-100 %] 98 % (06/22 0422) Weight:  [79.2 kg (174 lb 9.7 oz)] 79.2 kg (174 lb 9.7 oz) (06/21 1100) Last BM Date: 05/28/15  Intake/Output from previous day: 06/21 0701 - 06/22 0700 In: 617.5 [P.O.:350; I.V.:267.5] Out: 1125 [Urine:1125] Intake/Output this shift:    General appearance: cooperative Resp: clear to auscultation bilaterally Cardio: regular rate and rhythm GI: soft, NT, +BS Extremities: splint RLE Neuro: no change quad exam  IS: 1250  Lab Results: CBC   Recent Labs  05/30/15 1941  WBC 4.6  HGB 9.6*  HCT 27.5*  PLT 336   BMET  Recent Labs  05/30/15 1941  NA 137  K 4.1  CL 100*  CO2 30  GLUCOSE 150*  BUN <5*  CREATININE 0.61  CALCIUM 8.5*   PT/INR No results for input(s): LABPROT, INR in the last 72 hours. ABG No results for input(s): PHART, HCO3 in the last 72 hours.  Invalid input(s): PCO2, PO2  Studies/Results: Dg Cervical Spine 1 View  05/30/2015   CLINICAL DATA:  C3-C7 laminectomies.  EXAM: DG CERVICAL SPINE - 1 VIEW  COMPARISON:  Cervical spine CT 05/23/2015  FINDINGS: Lateral views of the cervical spine demonstrate posterior screw and rod fixation from C3-C7. There is mild disc space narrowing at C5-C6 and C6-C7. An endotracheal tube is present.  IMPRESSION: Posterior cervical spine fusion.   Electronically Signed   By: Markus Daft M.D.   On: 05/30/2015 16:20    Anti-infectives: Anti-infectives    Start     Dose/Rate Route Frequency Ordered Stop   05/30/15 2200  ceFAZolin (ANCEF) IVPB 2 g/50 mL premix     2 g 100 mL/hr over 30 Minutes Intravenous 3 times per day 05/30/15 1850  05/31/15 1446   05/30/15 1539  vancomycin (VANCOCIN) 1000 MG powder    Comments:  Felicity Coyer  : cabinet override      05/30/15 1539 05/31/15 0344   05/30/15 1522  bacitracin 50,000 Units in sodium chloride irrigation 0.9 % 500 mL irrigation  Status:  Discontinued       As needed 05/30/15 1522 05/30/15 1637   05/30/15 1330  ceFAZolin (ANCEF) IVPB 2 g/50 mL premix     2 g 100 mL/hr over 30 Minutes Intravenous To ShortStay Surgical 05/30/15 0557 05/30/15 1430   05/29/15 1915  ceFAZolin (ANCEF) IVPB 2 g/50 mL premix  Status:  Discontinued     2 g 100 mL/hr over 30 Minutes Intravenous To Surgery 05/29/15 1911 05/30/15 0557   05/27/15 0600  ceFAZolin (ANCEF) IVPB 2 g/50 mL premix  Status:  Discontinued     2 g 100 mL/hr over 30 Minutes Intravenous On call to O.R. 05/26/15 1713 05/26/15 1733   05/26/15 1715  ceFAZolin (ANCEF) IVPB 2 g/50 mL premix     2 g 100 mL/hr over 30 Minutes Intravenous Every 6 hours 05/26/15 1711 05/27/15 0514      Assessment/Plan: [facility] cord injury C3-7 - collar, S/P C3-7 lam/posterior fusion by Dr. Annette Stable. Approximate C6 functional level. Will have OT make splints for hands to prevent contracture. Resp failure - has  done well since extubation Forehead lac FEN - try Mg citrate ABL anemia VTE - PAS, Lovenox Dispo - CIR is evaluating  LOS: 9 days    Georganna Skeans, MD, MPH, FACS Trauma: [phone] General Surgery: [phone]  06/01/2015

## 2015-06-01 NOTE — Clinical Social Work Note (Signed)
Clinical Social Worker continuing to follow patient and family for support and discharge planning needs.  CSW spoke with patient sister, Phillip [name], over the phone due to patient worsening confusion.  Patient sister states that patient does have insurance coverage through [facility] and the cards were given to registration.  Patient current medical record and record to be merged show no indication of coverage.  CSW contacted financial counselor who will look into patient potential insurance coverage further.  Patient sister understanding that due to the lack of 24 hour support, patient will not be eligible for inpatient rehab and will need SNF placement.  CSW has initiated search in a 32 mile radius of [place] due to patient current status as a self pay.  CSW to update patient coverage once obtained and initiate a new search.  CSW remains available for support and to facilitate patient discharge needs once medically stable.  Barbette Or, [place]

## 2015-06-01 NOTE — Progress Notes (Signed)
Pt stable Right foot incisions ok Needs fx boot  F/u next week
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
## 2015-06-01 NOTE — Discharge Instructions (Signed)
Keep incision dry right foot Keep fracture boot on Non weight bearing

## 2015-06-02 NOTE — Clinical Social Work Note (Signed)
Clinical Social Worker continuing to follow patient and family for support and discharge planning needs.  CSW spoke with patient at bedside and patient sister over the phone to update on current bed offers.  Patient requested that family make decision regarding placement.  Patient sister plans to communicate with additional family members regarding current bed offers and notify CSW of bed choice.  CSW explained that patient would likely be medically ready for discharge tomorrow - family understanding, however requested update from MD.  [place] notified MD.  CSW remains available for support and to facilitate patient discharge needs.  Barbette Or, [place]
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
## 2015-06-02 NOTE — Progress Notes (Signed)
Orthopedic Tech Progress Note Patient Details:  Phillip [name] [birth year] [id number] [facility] called for brace order; spoke with Phillip [name] Patient ID: [name], male   DOB: 1966/06/25, 49 y.o.   MRN: [id number]   [name] 06/02/2015, 11:02 AM

## 2015-06-02 NOTE — Progress Notes (Signed)
Orthopedic Tech Progress Note Patient Details:  Phillip [name] 1966-11-16 [id number] Brace order completed by [facility] Patient ID: [name], male   DOB: 02-08-1966, 49 y.o.   MRN: [id number]   Braulio Bosch 06/02/2015, 5:04 PM

## 2015-06-02 NOTE — Progress Notes (Signed)
Patient's dressing had been saturated with drainage from the area where the hemovac had been pulled. Honeycomb dressing was changed and a gauze was placed over the opening. Aspen collar was reapplied. Will continue to monitor. Nicandro Perrault, Rande Brunt, RN
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
## 2015-06-02 NOTE — Progress Notes (Signed)
Patient ID: Phillip [name], male   DOB: [birth year], 49 y.o.   MRN: [id number] I called his sister Phillip [name] and discussed his progress and the plan for SNF. Patient examined and I agree with the assessment and plan  Georganna Skeans, MD, MPH, FACS Trauma: [phone] General Surgery: [phone]  06/02/2015 4:39 PM

## 2015-06-02 NOTE — Progress Notes (Addendum)
Patient ID: Phillip [name], male   DOB: September 26, 1966, 49 y.o.   MRN: [id number] 3 Days Post-Op  Subjective: No new complaints, had BM after mag citrate  Objective: Vital signs in last 24 hours: Temp:  [97.5 F (36.4 C)-100.3 F (37.9 C)] 98.2 F (36.8 C) (06/23 0539) Pulse Rate:  [72-93] 75 (06/23 0539) Resp:  [14-20] 16 (06/23 0539) BP: (115-153)/(70-92) 124/89 mmHg (06/23 0539) SpO2:  [99 %-100 %] 100 % (06/23 0539) Weight:  [82.7 kg (182 lb 5.1 oz)] 82.7 kg (182 lb 5.1 oz) (06/22 1500) Last BM Date: 05/28/15  Intake/Output from previous day: 06/22 0701 - 06/23 0700 In: 720 [P.O.:720] Out: 2400 [Urine:2350; Drains:50] Intake/Output this shift:    General appearance: alert and cooperative Neck: collar and dressing Resp: clear to auscultation bilaterally Cardio: regular rate and rhythm GI: soft, NT, ND Extremities: splint off RLE Neuro: no change quad  Lab Results: CBC   Recent Labs  05/30/15 1941  WBC 4.6  HGB 9.6*  HCT 27.5*  PLT 336   BMET  Recent Labs  05/30/15 1941  NA 137  K 4.1  CL 100*  CO2 30  GLUCOSE 150*  BUN <5*  CREATININE 0.61  CALCIUM 8.5*   PT/INR No results for input(s): LABPROT, INR in the last 72 hours. ABG No results for input(s): PHART, HCO3 in the last 72 hours.  Invalid input(s): PCO2, PO2  Studies/Results: No results found.  Anti-infectives: Anti-infectives    Start     Dose/Rate Route Frequency Ordered Stop   05/30/15 2200  ceFAZolin (ANCEF) IVPB 2 g/50 mL premix     2 g 100 mL/hr over 30 Minutes Intravenous 3 times per day 05/30/15 1850 05/31/15 1446   05/30/15 1539  vancomycin (VANCOCIN) 1000 MG powder    Comments:  Felicity Coyer  : cabinet override      05/30/15 1539 05/31/15 0344   05/30/15 1522  bacitracin 50,000 Units in sodium chloride irrigation 0.9 % 500 mL irrigation  Status:  Discontinued       As needed 05/30/15 1522 05/30/15 1637   05/30/15 1330  ceFAZolin (ANCEF) IVPB 2 g/50 mL premix     2  g 100 mL/hr over 30 Minutes Intravenous To ShortStay Surgical 05/30/15 0557 05/30/15 1430   05/29/15 1915  ceFAZolin (ANCEF) IVPB 2 g/50 mL premix  Status:  Discontinued     2 g 100 mL/hr over 30 Minutes Intravenous To Surgery 05/29/15 1911 05/30/15 0557   05/27/15 0600  ceFAZolin (ANCEF) IVPB 2 g/50 mL premix  Status:  Discontinued     2 g 100 mL/hr over 30 Minutes Intravenous On call to O.R. 05/26/15 1713 05/26/15 1733   05/26/15 1715  ceFAZolin (ANCEF) IVPB 2 g/50 mL premix     2 g 100 mL/hr over 30 Minutes Intravenous Every 6 hours 05/26/15 1711 05/27/15 0514      Assessment/Plan: [facility] Central cord injury C3-7 - collar, S/P C3-7 lam/posterior fusion by Dr. Annette Stable. Approximate C6 functional level. OT to make splints for hands to prevent contracture. Resp failure - has done well since extubation Forehead lac R ankle FX - S/P ORIF, per Dr. Marlou Sa FEN - Mg citrate relieved constipation, SL IVF ABL anemia VTE - PAS, Lovenox Dispo - will need SNF due to dispo. Of note, he was working at [employer] and will not be able to return to that.  LOS: 10 days    Georganna Skeans, MD, MPH, FACS Trauma: [phone] General Surgery: [phone]  06/02/2015

## 2015-06-02 NOTE — Progress Notes (Signed)
OT Cancellation Note  Patient Details Name: Phillip [name] MRN: [id number] DOB: March 17, 1966   Cancelled Treatment:      Ot awaiting biotech resting hand splints to establish night time wear schedule. Pt to only wear splints at night  Peri Maris  Pager: [phone]  06/02/2015, 12:35 PM

## 2015-06-03 ENCOUNTER — Encounter (HOSPITAL_COMMUNITY): Payer: Self-pay | Admitting: Orthopedic Surgery

## 2015-06-03 DIAGNOSIS — S82891A Other fracture of right lower leg, initial encounter for closed fracture: Secondary | ICD-10-CM

## 2015-06-03 DIAGNOSIS — S0181XA Laceration without foreign body of other part of head, initial encounter: Secondary | ICD-10-CM | POA: Diagnosis present

## 2015-06-03 HISTORY — DX: Other fracture of right lower leg, initial encounter for closed fracture: S82.891A

## 2015-06-03 MED ORDER — BACITRACIN-NEOMYCIN-POLYMYXIN OINTMENT TUBE: 1.0000 "application " | TOPICAL_OINTMENT | Freq: Two times a day (BID) | CUTANEOUS | Status: DC

## 2015-06-03 MED ORDER — HYDROMORPHONE HCL 1 MG/ML IJ SOLN: 0.5000 mg | INTRAMUSCULAR | Status: DC | PRN

## 2015-06-03 MED ORDER — ENOXAPARIN SODIUM 40 MG/0.4ML ~~LOC~~ SOLN: 40.0000 mg | SUBCUTANEOUS | Status: DC

## 2015-06-03 MED ORDER — OXYCODONE HCL 5 MG PO TABS: 5.0000 mg | ORAL_TABLET | ORAL | Status: DC | PRN

## 2015-06-03 MED ORDER — DOCUSATE SODIUM 100 MG PO CAPS
100.0000 mg | ORAL_CAPSULE | Freq: Two times a day (BID) | ORAL | Status: DC
  Administered 2015-06-03: 100 mg via ORAL
  Filled 2015-06-03: qty 1

## 2015-06-03 MED ORDER — OXYCODONE-ACETAMINOPHEN 5-325 MG PO TABS: 1.0000 | ORAL_TABLET | ORAL | Status: DC | PRN

## 2015-06-03 NOTE — Clinical Social Work Note (Signed)
Clinical Social Worker facilitated patient discharge including contacting patient family and facility to confirm patient discharge plans.  Clinical information faxed to facility and family agreeable with plan.  CSW arranged ambulance transport via [facility] to [facility].  RN to call report prior to discharge.  Clinical Social Worker will sign off for now as social work intervention is no longer needed. Please consult us again if new need arises.  Jesse Shondell Poulson, LCSW [phone] 

## 2015-06-03 NOTE — Care Management Note (Signed)
Case Management Note  Patient Details  Name: Phillip [name] MRN: [id number] Date of Birth: Feb 21, 1966  Subjective/Objective:     Pt medically stable for dc               Action/Plan: Plan dc to SNF today,per CSW arrangements.    Expected Discharge Date:  06/03/15            Expected Discharge Plan:  Skilled Nursing Facility  In-House Referral:  Clinical Social Work  Discharge planning Services  CM Consult  Post Acute Care Choice:    Choice offered to:     DME Arranged:    DME Agency:     HH Arranged:    [place] Agency:     Status of Service:  Completed, signed off  Medicare Important Message Given:  No Date Medicare IM Given:    Medicare IM give by:    Date Additional Medicare IM Given:    Additional Medicare Important Message give by:     If discussed at [place] of Stay Meetings, dates discussed:    Additional Comments:  Reinaldo Raddle, RN, BSN  Trauma/Neuro ICU Case Manager [phone]
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
## 2015-06-03 NOTE — Progress Notes (Signed)
Pt discharging at this time via stretcher [facility] transport family taking all personal belongings to facility with him. Aspen collar in place. Surgical dressing dry and intact. No noted distress. He denies pain or discomfort. IV's discontinued, dry dressing applied. Discharge paperwork taken with him to facility. Report called in to nurse Debbie at [facility].

## 2015-06-03 NOTE — Clinical Social Work Placement (Signed)
   CLINICAL SOCIAL WORK PLACEMENT  NOTE  Date:  06/03/2015  Patient Details  Name: Phillip [name] MRN: [id number] Date of Birth: 11/15/1966  Clinical Social Work is seeking post-discharge placement for this patient at the [place] level of care (*CSW will initial, date and re-position this form in  chart as items are completed):  Yes   Patient/family provided with [place] Work Department's list of facilities offering this level of care within the geographic area requested by the patient (or if unable, by the patient's family).  Yes   Patient/family informed of their freedom to choose among providers that offer the needed level of care, that participate in Medicare, Medicaid or managed care program needed by the patient, have an available bed and are willing to accept the patient.  No   Patient/family informed of [facility]'s ownership interest in [facility] and [facility], as well as of the fact that they are under no obligation to receive care at these facilities.  PASRR submitted to EDS on 05/30/15     PASRR number received on 05/30/15     Existing PASRR number confirmed on       FL2 transmitted to all facilities in geographic area requested by pt/family on 05/30/15     FL2 transmitted to all facilities within larger geographic area on       Patient informed that his/her managed care company has contracts with or will negotiate with certain facilities, including the following:        Yes   Patient/family informed of bed offers received.  Patient chooses bed at [facility]     Physician recommends and patient chooses bed at      Patient to be transferred to [facility] on 06/03/15.  Patient to be transferred to facility by Ambulance     Patient family notified on 06/03/15 of transfer.  Name of family member notified:  Cage Gupton (patient sister over the phone)     PHYSICIAN        Additional Comment:   Barbette Or, [place]

## 2015-06-03 NOTE — Progress Notes (Signed)
OT NOTE  Please place splints on at night for sleeping ONLY  . Check skin for redness/ break down- if noted remove immediately . Call OT [phone] with questions  Pt with wrist extension this session R stronger than L. Pt with no digit extension noted. Pt using a tendesis grasp for digit flexion. Pt and family asking about day time splints. Pt and family encouraged to have pt bend wrist to attempt to (A) with holding items and use of a cup with handle.    Jeri Modena   OTR/L Pager: [phone] Office: [phone] .

## 2015-06-03 NOTE — Anesthesia Postprocedure Evaluation (Signed)
  Anesthesia Post-op Note  Patient: Phillip [name]  Procedure(s) Performed: Procedure(s): C3-C7 decompressive laminectomy with posterior cervical fusion utilizing lateral mass instrumentation and local bone grafting (N/A)  Patient Location: PACU  Anesthesia Type:General  Level of Consciousness: awake and alert   Airway and Oxygen Therapy: Patient Spontanous Breathing  Post-op Pain: mild  Post-op Assessment: Post-op Vital signs reviewed LLE Motor Response: No tremor, No movement to painful stimulus LLE Sensation: Tingling RLE Motor Response: No tremor, No movement to painful stimulus RLE Sensation: Tingling      Post-op Vital Signs: Reviewed  Last Vitals:  Filed Vitals:   06/03/15 0918  BP: 154/88  Pulse: 99  Temp: 37 C  Resp: 18    Complications: No apparent anesthesia complications

## 2015-06-03 NOTE — Progress Notes (Signed)
Patient ID: Phillip [name], male   DOB: 1966-10-07, 49 y.o.   MRN: [id number]   LOS: 11 days   Subjective: C/o HA.   Objective: Vital signs in last 24 hours: Temp:  [98.2 F (36.8 C)-99.6 F (37.6 C)] 98.4 F (36.9 C) (06/24 0605) Pulse Rate:  [79-87] 86 (06/24 0605) Resp:  [16-20] 18 (06/24 0605) BP: (133-153)/(74-90) 153/81 mmHg (06/24 0605) SpO2:  [98 %-99 %] 98 % (06/24 0605) Last BM Date: 06/02/15   Physical Exam General appearance: alert and no distress Resp: clear to auscultation bilaterally Cardio: regular rate and rhythm GI: normal findings: bowel sounds normal and soft, non-tender   Assessment/Plan: [facility] Forehead lac -- Local care Central cord injury C3-7 - collar, S/P C3-7 lam/posterior fusion by Dr. Annette Stable. Approximate C6 functional level. OT to make splints for hands to prevent contracture. R ankle FX - S/P ORIF, NWB, per Dr. Marlou Sa ABL anemia FEN - Orals for pain VTE - SCD's, Lovenox Dispo - SNF when bed available    Lisette Abu, PA-C Pager: [phone] General Trauma PA Pager: [phone]  06/03/2015

## 2015-06-06 ENCOUNTER — Non-Acute Institutional Stay (SKILLED_NURSING_FACILITY): Payer: 59 | Admitting: Adult Health

## 2015-06-06 ENCOUNTER — Inpatient Hospital Stay (HOSPITAL_COMMUNITY)
Admission: EM | Admit: 2015-06-06 | Discharge: 2015-06-17 | DRG: 371 | Disposition: A | Discharge status: Skilled Nursing Facility | Payer: 59 | Attending: Internal Medicine | Admitting: Internal Medicine

## 2015-06-06 ENCOUNTER — Encounter: Payer: Self-pay | Admitting: Adult Health

## 2015-06-06 ENCOUNTER — Emergency Department (HOSPITAL_COMMUNITY): Payer: 59

## 2015-06-06 ENCOUNTER — Encounter (HOSPITAL_COMMUNITY): Payer: Self-pay

## 2015-06-06 DIAGNOSIS — N179 Acute kidney failure, unspecified: Secondary | ICD-10-CM | POA: Diagnosis present

## 2015-06-06 DIAGNOSIS — S14109S Unspecified injury at unspecified level of cervical spinal cord, sequela: Secondary | ICD-10-CM | POA: Diagnosis not present

## 2015-06-06 DIAGNOSIS — Y95 Nosocomial condition: Secondary | ICD-10-CM | POA: Diagnosis present

## 2015-06-06 DIAGNOSIS — R109 Unspecified abdominal pain: Secondary | ICD-10-CM

## 2015-06-06 DIAGNOSIS — G8250 Quadriplegia, unspecified: Secondary | ICD-10-CM

## 2015-06-06 DIAGNOSIS — A047 Enterocolitis due to Clostridium difficile: Principal | ICD-10-CM | POA: Diagnosis present

## 2015-06-06 DIAGNOSIS — R569 Unspecified convulsions: Secondary | ICD-10-CM | POA: Diagnosis not present

## 2015-06-06 DIAGNOSIS — S82891A Other fracture of right lower leg, initial encounter for closed fracture: Secondary | ICD-10-CM | POA: Diagnosis present

## 2015-06-06 DIAGNOSIS — J189 Pneumonia, unspecified organism: Secondary | ICD-10-CM | POA: Diagnosis present

## 2015-06-06 DIAGNOSIS — R52 Pain, unspecified: Secondary | ICD-10-CM

## 2015-06-06 DIAGNOSIS — E876 Hypokalemia: Secondary | ICD-10-CM | POA: Diagnosis present

## 2015-06-06 DIAGNOSIS — R4182 Altered mental status, unspecified: Secondary | ICD-10-CM

## 2015-06-06 DIAGNOSIS — D473 Essential (hemorrhagic) thrombocythemia: Secondary | ICD-10-CM

## 2015-06-06 DIAGNOSIS — R609 Edema, unspecified: Secondary | ICD-10-CM

## 2015-06-06 DIAGNOSIS — S0181XA Laceration without foreign body of other part of head, initial encounter: Secondary | ICD-10-CM | POA: Diagnosis present

## 2015-06-06 DIAGNOSIS — D75839 Thrombocytosis, unspecified: Secondary | ICD-10-CM

## 2015-06-06 DIAGNOSIS — Z981 Arthrodesis status: Secondary | ICD-10-CM

## 2015-06-06 DIAGNOSIS — Z8619 Personal history of other infectious and parasitic diseases: Secondary | ICD-10-CM | POA: Diagnosis present

## 2015-06-06 DIAGNOSIS — G9340 Encephalopathy, unspecified: Secondary | ICD-10-CM | POA: Diagnosis present

## 2015-06-06 DIAGNOSIS — R443 Hallucinations, unspecified: Secondary | ICD-10-CM | POA: Diagnosis present

## 2015-06-06 DIAGNOSIS — R7881 Bacteremia: Secondary | ICD-10-CM

## 2015-06-06 DIAGNOSIS — S14105A Unspecified injury at C5 level of cervical spinal cord, initial encounter: Secondary | ICD-10-CM | POA: Diagnosis present

## 2015-06-06 DIAGNOSIS — Z9889 Other specified postprocedural states: Secondary | ICD-10-CM

## 2015-06-06 DIAGNOSIS — F329 Major depressive disorder, single episode, unspecified: Secondary | ICD-10-CM | POA: Diagnosis present

## 2015-06-06 DIAGNOSIS — Z720 Tobacco use: Secondary | ICD-10-CM | POA: Diagnosis present

## 2015-06-06 DIAGNOSIS — S14109A Unspecified injury at unspecified level of cervical spinal cord, initial encounter: Secondary | ICD-10-CM | POA: Diagnosis present

## 2015-06-06 DIAGNOSIS — R509 Fever, unspecified: Secondary | ICD-10-CM

## 2015-06-06 DIAGNOSIS — D649 Anemia, unspecified: Secondary | ICD-10-CM | POA: Diagnosis present

## 2015-06-06 DIAGNOSIS — L89150 Pressure ulcer of sacral region, unstageable: Secondary | ICD-10-CM | POA: Diagnosis present

## 2015-06-06 DIAGNOSIS — A0472 Enterocolitis due to Clostridium difficile, not specified as recurrent: Secondary | ICD-10-CM

## 2015-06-06 DIAGNOSIS — G40909 Epilepsy, unspecified, not intractable, without status epilepticus: Secondary | ICD-10-CM | POA: Diagnosis present

## 2015-06-06 DIAGNOSIS — R1900 Intra-abdominal and pelvic swelling, mass and lump, unspecified site: Secondary | ICD-10-CM

## 2015-06-06 DIAGNOSIS — F17210 Nicotine dependence, cigarettes, uncomplicated: Secondary | ICD-10-CM | POA: Diagnosis present

## 2015-06-06 DIAGNOSIS — L8990 Pressure ulcer of unspecified site, unspecified stage: Secondary | ICD-10-CM | POA: Insufficient documentation

## 2015-06-06 HISTORY — DX: Tobacco use: Z72.0

## 2015-06-06 HISTORY — DX: Unspecified injury at unspecified level of cervical spinal cord, initial encounter: S14.109A

## 2015-06-06 LAB — I-STAT CG4 LACTIC ACID, ED: Lactic Acid, Venous: 0.64 mmol/L (ref 0.5–2.0)

## 2015-06-06 MED ORDER — BACLOFEN 10 MG PO TABS: 5.0000 mg | ORAL_TABLET | Freq: Three times a day (TID) | ORAL | Status: DC

## 2015-06-06 MED ORDER — QUETIAPINE FUMARATE 25 MG PO TABS: 25.0000 mg | ORAL_TABLET | Freq: Every day | ORAL | Status: DC
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
## 2015-06-06 NOTE — ED Notes (Signed)
Dr Glick in room. 
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
## 2015-06-07 ENCOUNTER — Encounter (HOSPITAL_COMMUNITY): Payer: Self-pay

## 2015-06-07 ENCOUNTER — Emergency Department (HOSPITAL_COMMUNITY): Payer: 59

## 2015-06-07 ENCOUNTER — Other Ambulatory Visit: Payer: Self-pay

## 2015-06-07 ENCOUNTER — Inpatient Hospital Stay (HOSPITAL_COMMUNITY): Payer: 59

## 2015-06-07 DIAGNOSIS — R569 Unspecified convulsions: Secondary | ICD-10-CM

## 2015-06-07 DIAGNOSIS — Y95 Nosocomial condition: Secondary | ICD-10-CM | POA: Diagnosis present

## 2015-06-07 DIAGNOSIS — N179 Acute kidney failure, unspecified: Secondary | ICD-10-CM | POA: Diagnosis present

## 2015-06-07 DIAGNOSIS — G8250 Quadriplegia, unspecified: Secondary | ICD-10-CM | POA: Diagnosis present

## 2015-06-07 DIAGNOSIS — G9340 Encephalopathy, unspecified: Secondary | ICD-10-CM | POA: Diagnosis present

## 2015-06-07 DIAGNOSIS — L89150 Pressure ulcer of sacral region, unstageable: Secondary | ICD-10-CM | POA: Diagnosis present

## 2015-06-07 DIAGNOSIS — F329 Major depressive disorder, single episode, unspecified: Secondary | ICD-10-CM | POA: Diagnosis present

## 2015-06-07 DIAGNOSIS — A047 Enterocolitis due to Clostridium difficile: Secondary | ICD-10-CM | POA: Diagnosis present

## 2015-06-07 DIAGNOSIS — S0181XD Laceration without foreign body of other part of head, subsequent encounter: Secondary | ICD-10-CM

## 2015-06-07 DIAGNOSIS — Z981 Arthrodesis status: Secondary | ICD-10-CM | POA: Diagnosis not present

## 2015-06-07 DIAGNOSIS — B957 Other staphylococcus as the cause of diseases classified elsewhere: Secondary | ICD-10-CM | POA: Diagnosis not present

## 2015-06-07 DIAGNOSIS — R509 Fever, unspecified: Secondary | ICD-10-CM

## 2015-06-07 DIAGNOSIS — D649 Anemia, unspecified: Secondary | ICD-10-CM | POA: Diagnosis not present

## 2015-06-07 DIAGNOSIS — J189 Pneumonia, unspecified organism: Secondary | ICD-10-CM | POA: Diagnosis present

## 2015-06-07 DIAGNOSIS — S82891A Other fracture of right lower leg, initial encounter for closed fracture: Secondary | ICD-10-CM | POA: Diagnosis not present

## 2015-06-07 DIAGNOSIS — E876 Hypokalemia: Secondary | ICD-10-CM | POA: Diagnosis present

## 2015-06-07 DIAGNOSIS — G40909 Epilepsy, unspecified, not intractable, without status epilepticus: Secondary | ICD-10-CM | POA: Diagnosis present

## 2015-06-07 DIAGNOSIS — R609 Edema, unspecified: Secondary | ICD-10-CM | POA: Diagnosis not present

## 2015-06-07 DIAGNOSIS — F17210 Nicotine dependence, cigarettes, uncomplicated: Secondary | ICD-10-CM | POA: Diagnosis present

## 2015-06-07 DIAGNOSIS — R7881 Bacteremia: Secondary | ICD-10-CM | POA: Diagnosis not present

## 2015-06-07 LAB — CBC WITH DIFFERENTIAL/PLATELET
BASOS ABS: 0.1 10*3/uL (ref 0.0–0.1)
BASOS PCT: 1 % (ref 0–1)
EOS PCT: 2 % (ref 0–5)
Eosinophils Absolute: 0.2 10*3/uL (ref 0.0–0.7)
HCT: 24.9 % — ABNORMAL LOW (ref 39.0–52.0)
HEMOGLOBIN: 8.2 g/dL — AB (ref 13.0–17.0)
Lymphocytes Relative: 16 % (ref 12–46)
Lymphs Abs: 1.6 10*3/uL (ref 0.7–4.0)
MCH: 31.4 pg (ref 26.0–34.0)
MCHC: 32.9 g/dL (ref 30.0–36.0)
MCV: 95.4 fL (ref 78.0–100.0)
Monocytes Absolute: 1 10*3/uL (ref 0.1–1.0)
Monocytes Relative: 10 % (ref 3–12)
Neutro Abs: 7 10*3/uL (ref 1.7–7.7)
Neutrophils Relative %: 71 % (ref 43–77)
PLATELETS: 471 10*3/uL — AB (ref 150–400)
RBC: 2.61 MIL/uL — ABNORMAL LOW (ref 4.22–5.81)
RDW: 12.1 % (ref 11.5–15.5)
WBC: 9.9 10*3/uL (ref 4.0–10.5)

## 2015-06-07 LAB — URINE MICROSCOPIC-ADD ON

## 2015-06-07 LAB — URINALYSIS, ROUTINE W REFLEX MICROSCOPIC
BILIRUBIN URINE: NEGATIVE
GLUCOSE, UA: NEGATIVE mg/dL
Hgb urine dipstick: NEGATIVE
KETONES UR: NEGATIVE mg/dL
Nitrite: NEGATIVE
Protein, ur: NEGATIVE mg/dL
Specific Gravity, Urine: 1.006 (ref 1.005–1.030)
Urobilinogen, UA: 0.2 mg/dL (ref 0.0–1.0)
pH: 7.5 (ref 5.0–8.0)

## 2015-06-07 LAB — COMPREHENSIVE METABOLIC PANEL
ALBUMIN: 2.4 g/dL — AB (ref 3.5–5.0)
ALK PHOS: 90 U/L (ref 38–126)
ALK PHOS: 99 U/L (ref 38–126)
ALT: 85 U/L — ABNORMAL HIGH (ref 17–63)
ALT: 98 U/L — AB (ref 17–63)
AST: 100 U/L — ABNORMAL HIGH (ref 15–41)
AST: 73 U/L — ABNORMAL HIGH (ref 15–41)
Albumin: 2.2 g/dL — ABNORMAL LOW (ref 3.5–5.0)
Anion gap: 12 (ref 5–15)
Anion gap: 8 (ref 5–15)
BUN: 24 mg/dL — ABNORMAL HIGH (ref 6–20)
BUN: 28 mg/dL — AB (ref 6–20)
CHLORIDE: 103 mmol/L (ref 101–111)
CO2: 25 mmol/L (ref 22–32)
CO2: 27 mmol/L (ref 22–32)
CREATININE: 1.37 mg/dL — AB (ref 0.61–1.24)
Calcium: 8.1 mg/dL — ABNORMAL LOW (ref 8.9–10.3)
Calcium: 8.6 mg/dL — ABNORMAL LOW (ref 8.9–10.3)
Chloride: 108 mmol/L (ref 101–111)
Creatinine, Ser: 1.16 mg/dL (ref 0.61–1.24)
GFR calc Af Amer: >60 mL/min (ref >60)
GFR calc non Af Amer: >60 mL/min (ref >60)
GLUCOSE: 98 mg/dL (ref 65–99)
Glucose, Bld: 100 mg/dL — ABNORMAL HIGH (ref 65–99)
Potassium: 3.4 mmol/L — ABNORMAL LOW (ref 3.5–5.1)
Potassium: 3.8 mmol/L (ref 3.5–5.1)
Sodium: 141 mmol/L (ref 135–145)
Sodium: 142 mmol/L (ref 135–145)
Total Bilirubin: 0.5 mg/dL (ref 0.3–1.2)
Total Bilirubin: 0.6 mg/dL (ref 0.3–1.2)
Total Protein: 6.1 g/dL — ABNORMAL LOW (ref 6.5–8.1)
Total Protein: 6.5 g/dL (ref 6.5–8.1)

## 2015-06-07 LAB — CK: Total CK: 132 U/L (ref 49–397)

## 2015-06-07 LAB — SODIUM, URINE, RANDOM: SODIUM UR: 66 mmol/L

## 2015-06-07 LAB — AMMONIA: Ammonia: 35 umol/L (ref 9–35)

## 2015-06-07 LAB — APTT: aPTT: 35 seconds (ref 24–37)

## 2015-06-07 LAB — PROCALCITONIN: PROCALCITONIN: 0.33 ng/mL

## 2015-06-07 LAB — VITAMIN B12: Vitamin B-12: 219 pg/mL (ref 180–914)

## 2015-06-07 LAB — PROTIME-INR
INR: 1.38 (ref 0.00–1.49)
Prothrombin Time: 17.1 seconds — ABNORMAL HIGH (ref 11.6–15.2)

## 2015-06-07 LAB — GLUCOSE, CAPILLARY: Glucose-Capillary: 100 mg/dL — ABNORMAL HIGH (ref 65–99)

## 2015-06-07 LAB — LACTIC ACID, PLASMA: Lactic Acid, Venous: 0.6 mmol/L (ref 0.5–2.0)

## 2015-06-07 LAB — CREATININE, URINE, RANDOM: Creatinine, Urine: 64.61 mg/dL

## 2015-06-07 LAB — TSH: TSH: 1.649 u[IU]/mL (ref 0.350–4.500)

## 2015-06-07 MED ORDER — VANCOMYCIN HCL IN DEXTROSE 1-5 GM/200ML-% IV SOLN
1000.0000 mg | Freq: Two times a day (BID) | INTRAVENOUS | Status: DC
  Administered 2015-06-07 – 2015-06-09 (×6): 1000 mg via INTRAVENOUS
  Filled 2015-06-07 (×7): qty 200

## 2015-06-07 MED ORDER — BISACODYL 10 MG RE SUPP: 10.0000 mg | Freq: Every day | RECTAL | Status: DC | PRN

## 2015-06-07 MED ORDER — SODIUM CHLORIDE 0.9 % IJ SOLN
3.0000 mL | Freq: Two times a day (BID) | INTRAMUSCULAR | Status: DC
  Administered 2015-06-08 – 2015-06-17 (×10): 3 mL via INTRAVENOUS

## 2015-06-07 MED ORDER — PIPERACILLIN-TAZOBACTAM 3.375 G IVPB
3.3750 g | Freq: Three times a day (TID) | INTRAVENOUS | Status: DC
  Administered 2015-06-07 – 2015-06-08 (×5): 3.375 g via INTRAVENOUS
  Filled 2015-06-07 (×6): qty 50

## 2015-06-07 MED ORDER — OXYCODONE-ACETAMINOPHEN 5-325 MG PO TABS
1.0000 | ORAL_TABLET | ORAL | Status: DC | PRN
  Administered 2015-06-09: 2 via ORAL
  Administered 2015-06-10: 1 via ORAL
  Administered 2015-06-10 – 2015-06-17 (×15): 2 via ORAL
  Filled 2015-06-07 (×6): qty 2
  Filled 2015-06-07: qty 1
  Filled 2015-06-07 (×12): qty 2

## 2015-06-07 MED ORDER — SODIUM CHLORIDE 0.9 % IV SOLN: INTRAVENOUS | Status: AC

## 2015-06-07 MED ORDER — SODIUM CHLORIDE 0.9 % IV SOLN
1000.0000 mL | INTRAVENOUS | Status: DC
  Administered 2015-06-07: 1000 mL via INTRAVENOUS

## 2015-06-07 MED ORDER — ADULT MULTIVITAMIN W/MINERALS CH
1.0000 | ORAL_TABLET | Freq: Every day | ORAL | Status: DC
  Administered 2015-06-07 – 2015-06-17 (×11): 1 via ORAL
  Filled 2015-06-07 (×11): qty 1

## 2015-06-07 MED ORDER — POTASSIUM CHLORIDE 10 MEQ/100ML IV SOLN
10.0000 meq | INTRAVENOUS | Status: AC
  Administered 2015-06-07 (×2): 10 meq via INTRAVENOUS
  Filled 2015-06-07 (×2): qty 100

## 2015-06-07 MED ORDER — ENSURE ENLIVE PO LIQD
237.0000 mL | Freq: Two times a day (BID) | ORAL | Status: DC
  Administered 2015-06-07 – 2015-06-17 (×15): 237 mL via ORAL

## 2015-06-07 MED ORDER — NICOTINE 21 MG/24HR TD PT24
21.0000 mg | MEDICATED_PATCH | Freq: Every day | TRANSDERMAL | Status: DC
  Administered 2015-06-07 – 2015-06-17 (×11): 21 mg via TRANSDERMAL
  Filled 2015-06-07 (×12): qty 1

## 2015-06-07 MED ORDER — ACETAMINOPHEN 325 MG PO TABS
650.0000 mg | ORAL_TABLET | ORAL | Status: DC | PRN
  Administered 2015-06-08 – 2015-06-17 (×11): 650 mg via ORAL
  Filled 2015-06-07 (×11): qty 2

## 2015-06-07 MED ORDER — SODIUM CHLORIDE 0.9 % IV BOLUS (SEPSIS)
1500.0000 mL | Freq: Once | INTRAVENOUS | Status: AC
Start: 2015-06-07 — End: 2015-06-07
  Administered 2015-06-07: 1500 mL via INTRAVENOUS

## 2015-06-07 MED ORDER — QUETIAPINE FUMARATE 25 MG PO TABS
25.0000 mg | ORAL_TABLET | Freq: Every day | ORAL | Status: DC
  Administered 2015-06-07 – 2015-06-16 (×10): 25 mg via ORAL
  Filled 2015-06-07 (×10): qty 1

## 2015-06-07 MED ORDER — SODIUM CHLORIDE 0.9 % IV SOLN
INTRAVENOUS | Status: DC
  Administered 2015-06-07 – 2015-06-08 (×2): via INTRAVENOUS
  Filled 2015-06-07 (×5): qty 1000

## 2015-06-07 MED ORDER — IOHEXOL 300 MG/ML  SOLN: 25.0000 mL | INTRAMUSCULAR | Status: AC

## 2015-06-07 MED ORDER — HEPARIN SODIUM (PORCINE) 5000 UNIT/ML IJ SOLN
5000.0000 [IU] | Freq: Three times a day (TID) | INTRAMUSCULAR | Status: DC
  Administered 2015-06-07 – 2015-06-17 (×32): 5000 [IU] via SUBCUTANEOUS
  Filled 2015-06-07 (×32): qty 1

## 2015-06-07 MED ORDER — SODIUM CHLORIDE 0.9 % IV SOLN
1000.0000 mL | Freq: Once | INTRAVENOUS | Status: AC
  Administered 2015-06-07: 1000 mL via INTRAVENOUS

## 2015-06-07 MED ORDER — POTASSIUM CHLORIDE 10 MEQ/100ML IV SOLN
10.0000 meq | INTRAVENOUS | Status: AC
  Administered 2015-06-07: 10 meq via INTRAVENOUS
  Filled 2015-06-07: qty 100

## 2015-06-07 MED ORDER — BACITRACIN-NEOMYCIN-POLYMYXIN OINTMENT TUBE
1.0000 "application " | TOPICAL_OINTMENT | Freq: Two times a day (BID) | CUTANEOUS | Status: DC
  Administered 2015-06-07 – 2015-06-17 (×21): 1 via TOPICAL
  Filled 2015-06-07: qty 15
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
## 2015-06-07 NOTE — Anesthesia Preprocedure Evaluation (Signed)
Anesthesia Evaluation  Patient identified by MRN, date of birth, ID band Patient awake    Reviewed: Allergy & Precautions, NPO status   Airway Mallampati: II  TM Distance: >3 FB Neck ROM: Full    Dental   Pulmonary Current Smoker,  breath sounds clear to auscultation        Cardiovascular negative cardio ROS  Rhythm:Regular Rate:Normal     Neuro/Psych    GI/Hepatic negative GI ROS, Neg liver ROS,   Endo/Other  negative endocrine ROS  Renal/GU Renal disease     Musculoskeletal   Abdominal   Peds  Hematology   Anesthesia Other Findings   Reproductive/Obstetrics                             Anesthesia Physical Anesthesia Plan  ASA: III  Anesthesia Plan: General   Post-op Pain Management:    Induction: Intravenous  Airway Management Planned: Oral ETT  Additional Equipment:   Intra-op Plan:   Post-operative Plan: Extubation in OR  Informed Consent: I have reviewed the patients History and Physical, chart, labs and discussed the procedure including the risks, benefits and alternatives for the proposed anesthesia with the patient or authorized representative who has indicated his/her understanding and acceptance.   Dental advisory given  Plan Discussed with: CRNA and Anesthesiologist  Anesthesia Plan Comments:         Anesthesia Quick Evaluation

## 2015-06-07 NOTE — Progress Notes (Signed)
EEG Completed; Results Pending  
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
## 2015-06-07 NOTE — Progress Notes (Signed)
CM spoke with Jarrett Soho CSW, who spoke with [facility] regarding patient's plan of care.  The suggestion was made by [facility] that patient be evaluated by the [facility] in [place], [place]. (Please see CSW note from 06/07/15) Patient is currently on bedrest. Await medical clearance for PT/OT evals and recommendations prior to submitting any necessary referrals. Should the patient prove appropriate for [facility] and family is in agreement, clinicals can be faxed to [phone]. This was discussed with Dr Tat, who is in agreement with the plan.

## 2015-06-07 NOTE — ED Notes (Signed)
Pt given water to drink, tolerating oral fluids well.

## 2015-06-07 NOTE — Progress Notes (Signed)
PT Cancellation Note  Patient Details Name: LARRON ARMOR MRN: [id number] DOB: [birth year]   Cancelled Treatment:    Reason Eval/Treat Not Completed: Patient not medically ready (active bedrest, strict bedrest orders at this time)   Duncan Dull 06/07/2015, 7:18 AM Alben Deeds, PT DPT  [phone]

## 2015-06-07 NOTE — Progress Notes (Signed)
Patient with contrast to be given, although he has order for dys 3 thin liquid but currently appears lethargic, able to awake for a few second and fall right back asleep. Q2 hr turn perform. RN attempt to wake patient up for oral fluid, unsuccessful , another RN  Clement Sayres) at bedside at this time. MD paged r/t status. MD verbalized to hold off CT w/ contrast at this time until MD assesses pt. CT Tech notified of change. Will continue to monitor.   Ave Filter, RN

## 2015-06-07 NOTE — Progress Notes (Signed)
OT Cancellation Note  Patient Details Name: Phillip [name] MRN: [id number] DOB: [birth year]   Cancelled Treatment:    Reason Eval/Treat Not Completed: Medical issues which prohibited therapy (Bedrest at this time )  Peri Maris  Pager: [phone]  06/07/2015, 7:17 AM

## 2015-06-07 NOTE — Progress Notes (Signed)
ANTIBIOTIC CONSULT NOTE - INITIAL  Pharmacy Consult for Vancomycin/Zosyn  Indication: Fever, unknown source  No Known Allergies  Patient Measurements: Height: 5\' 9"  (175.3 cm) Weight: 168 lb (76.204 kg) IBW/kg (Calculated) : 70.7  Vital Signs: Temp: 99.9 F (37.7 C) (06/28 0400) Temp Source: Oral (06/28 0400) BP: 166/104 mmHg (06/28 0301) Pulse Rate: 96 (06/28 0301)  Labs:  Recent Labs  06/06/15 2330  WBC 9.9  HGB 8.2*  PLT 471*  CREATININE 1.37*   Estimated Creatinine Clearance: 67.4 mL/min (by C-G formula based on Cr of 1.37). No results for input(s): VANCOTROUGH, VANCOPEAK, VANCORANDOM, GENTTROUGH, GENTPEAK, GENTRANDOM, TOBRATROUGH, TOBRAPEAK, TOBRARND, AMIKACINPEAK, AMIKACINTROU, AMIKACIN in the last 72 hours.   Microbiology: Recent Results (from the past 720 hour(s))  MRSA PCR Screening     Status: None   Collection Time: 05/24/15  1:50 AM  Result Value Ref Range Status   MRSA by PCR NEGATIVE NEGATIVE Final    Comment:        The GeneXpert MRSA Assay (FDA approved for NASAL specimens only), is one component of a comprehensive MRSA colonization surveillance program. It is not intended to diagnose MRSA infection nor to guide or monitor treatment for MRSA infections.     Medical History: Past Medical History  Diagnosis Date  . DJD (degenerative joint disease)   . Seizures   . Broken hip   . Cervical spinal cord injury   . Tobacco abuse     Assessment: 49 y/o M to start broad spectrum anti-biotics for fever of unknown origin. WBC WNL, some acute kidney injury present, other labs as above.   Goal of Therapy:  Vancomycin trough level 15-20 mcg/ml  Plan:  -Vancomycin 1000 mg IV q12h -Zosyn 3.375G IV q8h to be infused over 4 hours -Trend WBC, temp, renal function  -Drug levels as indicated   Narda Bonds 06/07/2015,4:15 AM

## 2015-06-07 NOTE — Progress Notes (Signed)
LCSW spoke with Tammy with regards to recommendations from Physician at [facility].  Physician at facility requesting for a CIR referral and [facility] which is located in [place])  Spoke with CSW who was working with patient from previous admission and reports CIR and [facility] declined patient as he does not have a coverage for this type of placement and he does not have 24 hour supervision when he would discharge. He was referred for SNF placement with bed offers at [place].  Discussion of patient going to a spinal cord facility was discussed, but referral not made as family will not be able to provide care for him 24/7 and his outcomes are most likely going to be wheelchair bound with limited progress but more training.  Will call [facility] on 4N to complete referral and assist with needs as requested by Physician at [facility].   If declined patient will return to SNF: [facility] as they report they will accept him back, just wanting to make sure patient gets more adequate/aggressive care per his needs.  Lane Hacker, MSW Clinical Social Work: Emergency Room [phone]

## 2015-06-07 NOTE — Progress Notes (Addendum)
Bladder scanned patient of greater than 1000ML. Foley catheter inserted per MD requested. 1350 CC urine emptied from foley bag. Abdomen appears soften non distended at this time. MD aware.    Ave Filter, RN

## 2015-06-07 NOTE — Procedures (Signed)
History: 49 yo M being evaluated for altered mental status  Sedation: None  Technique: This is a 19 channel routine scalp EEG performed at the bedside with bipolar and monopolar montages arranged in accordance to the international 10/20 system of electrode placement. One channel was dedicated to EKG recording.    Background: The background consists of mild generalized irregular delta activity with a moderate voltage well sustained a posterior dominant rhythm of 9 Hz that is reactive to eye opening and closure. There is significant muscle artifact intermittently during the study, and the tech notes a constant whole body tremor, there is no clear epileptiform activity associated with this. Throughout the study, his head is towards the left.   Photic stimulation: Physiologic driving is not performed  EEG Abnormalities: 1) Mild irregular delta activity  Clinical Interpretation: This EEG is consistent with a mild generalized non-specific cerebral dysfunction(encephalopathy). There was no seizure or seizure predisposition recorded on this study.   Roland Rack, MD [facility] [phone]  If 7pm- 7am, please page neurology on call as listed in [place].

## 2015-06-07 NOTE — Progress Notes (Addendum)
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 and alert. Denies any headache, chest pain, shortness breath, abdominal pain, vomiting, diarrhea. He complains of bilateral shoulder pain. Denies any neck pain.   Objective: Filed Vitals:   06/07/15 0301 06/07/15 0400 06/07/15 0610 06/07/15 0935  BP: 166/104 157/86 163/103 152/82  Pulse: 96 94 94 92  Temp:  99.9 F (37.7 C) 98.5 F (36.9 C) 98.7 F (37.1 C)  TempSrc:  Oral Oral Oral  Resp: 25 21 15 20   Height:  5\' 9"  (1.753 m)    Weight:  76.204 kg (168 lb)    SpO2: 93% 97% 97% 98%    Intake/Output Summary (Last 24 hours) at 06/07/15  1325 Last data filed at 06/07/15 1100  Gross per 24 hour  Intake     50 ml  Output   1200 ml  Net  -1150 ml   Weight change:  Exam:   General:  Pt is alert, follows commands appropriately, not in acute distress  HEENT: No icterus, No thrush,  [place]/AT; frontal abrasion on his forehead without any erythema, purulent drainage  Cardiovascular: RRR, S1/S2, no rubs, no gallops  Respiratory: Poor inspiratory effort. Bibasilar crackles. No wheeze.   Abdomen: Soft/+BS, non tender, non distended, no guarding  Extremities: 1+LE edema, No lymphangitis, No petechiae, No rashes, no synovitis; right  ankle without any erythema, drainage, crepitance, necrosis  Data Reviewed: Basic Metabolic Panel:  Recent Labs Lab 06/06/15 2330 06/07/15 0615  NA 142 141  K 3.8 3.4*  CL 103 108  CO2 27 25  GLUCOSE 100* 98  BUN 28* 24*  CREATININE 1.37* 1.16  CALCIUM 8.6* 8.1*   Liver Function Tests:  Recent Labs Lab 06/06/15 2330 06/07/15 0615  AST 100* 73*  ALT 98* 85*  ALKPHOS 99 90  BILITOT 0.6 0.5  PROT 6.5 6.1*  ALBUMIN 2.4* 2.2*   No results for input(s): LIPASE, AMYLASE in the last 168 hours.  Recent Labs Lab 06/07/15 1127  AMMONIA 35   CBC:  Recent Labs Lab 06/06/15 2330  WBC 9.9  NEUTROABS 7.0  HGB 8.2*  HCT 24.9*  MCV 95.4  PLT 471*   Cardiac Enzymes:  Recent Labs Lab 06/06/15 2330  CKTOTAL 132   BNP: Invalid input(s): POCBNP CBG:  Recent Labs Lab 06/07/15 0609  GLUCAP 100*    No results found for this or any previous visit (from the past 240 hour(s)).   Scheduled Meds: . sodium chloride   Intravenous STAT  . heparin  5,000 Units Subcutaneous 3 times per day  . iohexol  25 mL Oral Q1 Hr x 2  . neomycin-bacitracin-polymyxin  1 application Topical BID  . nicotine  21 mg Transdermal Daily  . piperacillin-tazobactam (ZOSYN)  IV  3.375 g Intravenous 3 times per day  . QUEtiapine  25 mg Oral QHS  . sodium chloride  3 mL Intravenous Q12H  . vancomycin   1,000 mg Intravenous Q12H   Continuous Infusions:    Areyana Leoni, DO  [facility] Pager [phone]  If 7PM-7AM, please contact night-coverage [email or web address] Password [email or web address] 06/07/2015, 1:25 PM   LOS: 0 days

## 2015-06-07 NOTE — Anesthesia Postprocedure Evaluation (Signed)
Anesthesia Post Note  Patient: Phillip [name]  Procedure(s) Performed: Procedure(s) (LRB): OPEN REDUCTION INTERNAL FIXATION (ORIF) ANKLE FRACTURE (Right)  Anesthesia type: General  Patient location: PACU  Post pain: Pain level controlled and Adequate analgesia  Post assessment: Post-op Vital signs reviewed, Patient's Cardiovascular Status Stable, Respiratory Function Stable, Patent Airway and Pain level controlled  Last Vitals:  Filed Vitals:   06/03/15 1556  BP: 146/77  Pulse: 98  Temp: 37.8 C  Resp: 18    Post vital signs: Reviewed and stable  Level of consciousness: awake, alert  and oriented  Complications: No apparent anesthesia complications
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
## 2015-06-07 NOTE — Progress Notes (Signed)
Patient arrived to [place] and anxious. Sister is at the bedside. Tele placed, vitals taken and questions answered. Pt's sister concerned about pt's swollen knees and distended abdomen. Pt changed and comfortable. Seizure precautions in place as well as contact precautions. Will continue to monitor. Adarrius Graeff, Rande Brunt, RN

## 2015-06-08 ENCOUNTER — Inpatient Hospital Stay (HOSPITAL_COMMUNITY): Payer: 59

## 2015-06-08 ENCOUNTER — Encounter (HOSPITAL_COMMUNITY): Payer: Self-pay

## 2015-06-08 DIAGNOSIS — Z8619 Personal history of other infectious and parasitic diseases: Secondary | ICD-10-CM | POA: Diagnosis present

## 2015-06-08 DIAGNOSIS — D649 Anemia, unspecified: Secondary | ICD-10-CM | POA: Insufficient documentation

## 2015-06-08 DIAGNOSIS — R7881 Bacteremia: Secondary | ICD-10-CM | POA: Insufficient documentation

## 2015-06-08 DIAGNOSIS — R609 Edema, unspecified: Secondary | ICD-10-CM

## 2015-06-08 LAB — COMPREHENSIVE METABOLIC PANEL
ALK PHOS: 75 U/L (ref 38–126)
ALT: 67 U/L — ABNORMAL HIGH (ref 17–63)
ANION GAP: 10 (ref 5–15)
AST: 42 U/L — ABNORMAL HIGH (ref 15–41)
Albumin: 1.9 g/dL — ABNORMAL LOW (ref 3.5–5.0)
BILIRUBIN TOTAL: 0.6 mg/dL (ref 0.3–1.2)
BUN: 14 mg/dL (ref 6–20)
CO2: 28 mmol/L (ref 22–32)
Calcium: 8.5 mg/dL — ABNORMAL LOW (ref 8.9–10.3)
Chloride: 103 mmol/L (ref 101–111)
Creatinine, Ser: 0.93 mg/dL (ref 0.61–1.24)
GFR calc non Af Amer: >60 mL/min (ref >60)
Glucose, Bld: 98 mg/dL (ref 65–99)
Potassium: 3.2 mmol/L — ABNORMAL LOW (ref 3.5–5.1)
Sodium: 141 mmol/L (ref 135–145)
Total Protein: 5.8 g/dL — ABNORMAL LOW (ref 6.5–8.1)

## 2015-06-08 LAB — CBC
HEMATOCRIT: 23.5 % — AB (ref 39.0–52.0)
HEMOGLOBIN: 7.7 g/dL — AB (ref 13.0–17.0)
MCH: 31 pg (ref 26.0–34.0)
MCHC: 32.8 g/dL (ref 30.0–36.0)
MCV: 94.8 fL (ref 78.0–100.0)
Platelets: 462 10*3/uL — ABNORMAL HIGH (ref 150–400)
RBC: 2.48 MIL/uL — ABNORMAL LOW (ref 4.22–5.81)
RDW: 12 % (ref 11.5–15.5)
WBC: 7.3 10*3/uL (ref 4.0–10.5)

## 2015-06-08 LAB — URINE CULTURE

## 2015-06-08 LAB — GLUCOSE, CAPILLARY: Glucose-Capillary: 87 mg/dL (ref 65–99)

## 2015-06-08 LAB — CLOSTRIDIUM DIFFICILE BY PCR: Toxigenic C. Difficile by PCR: POSITIVE — AB

## 2015-06-08 LAB — HIV ANTIBODY (ROUTINE TESTING W REFLEX): HIV SCREEN 4TH GENERATION: NONREACTIVE

## 2015-06-08 LAB — MAGNESIUM: Magnesium: 1.6 mg/dL — ABNORMAL LOW (ref 1.7–2.4)

## 2015-06-08 MED ORDER — MAGNESIUM SULFATE 2 GM/50ML IV SOLN
2.0000 g | Freq: Once | INTRAVENOUS | Status: AC
  Administered 2015-06-08: 2 g via INTRAVENOUS
  Filled 2015-06-08: qty 50

## 2015-06-08 MED ORDER — POTASSIUM CHLORIDE IN NACL 20-0.9 MEQ/L-% IV SOLN
INTRAVENOUS | Status: DC
  Administered 2015-06-08 – 2015-06-09 (×3): via INTRAVENOUS
  Administered 2015-06-10: 30 mL/h via INTRAVENOUS
  Administered 2015-06-12 – 2015-06-14 (×2): via INTRAVENOUS
  Filled 2015-06-08 (×3): qty 1000

## 2015-06-08 MED ORDER — POTASSIUM CHLORIDE 10 MEQ/100ML IV SOLN
10.0000 meq | INTRAVENOUS | Status: AC
  Administered 2015-06-08 (×2): 10 meq via INTRAVENOUS
  Filled 2015-06-08 (×2): qty 100

## 2015-06-08 MED ORDER — GADOBENATE DIMEGLUMINE 529 MG/ML IV SOLN
15.0000 mL | Freq: Once | INTRAVENOUS | Status: AC | PRN
  Administered 2015-06-08: 15 mL via INTRAVENOUS

## 2015-06-08 MED ORDER — METRONIDAZOLE 500 MG PO TABS
500.0000 mg | ORAL_TABLET | Freq: Three times a day (TID) | ORAL | Status: DC
  Administered 2015-06-08 – 2015-06-11 (×9): 500 mg via ORAL
  Filled 2015-06-08 (×9): qty 1

## 2015-06-08 MED ORDER — VITAMIN B-12 1000 MCG PO TABS
1000.0000 ug | ORAL_TABLET | Freq: Every day | ORAL | Status: DC
  Administered 2015-06-08 – 2015-06-17 (×10): 1000 ug via ORAL
  Filled 2015-06-08 (×10): qty 1

## 2015-06-08 MED ORDER — SACCHAROMYCES BOULARDII 250 MG PO CAPS
250.0000 mg | ORAL_CAPSULE | Freq: Two times a day (BID) | ORAL | Status: DC
  Administered 2015-06-08 – 2015-06-17 (×19): 250 mg via ORAL
  Filled 2015-06-08 (×19): qty 1
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
## 2015-06-08 NOTE — Progress Notes (Signed)
Lab called RN with results of blood cultures: Aerobic bottle with gram positive cocci and clusters. Mitsuru Dault, Rande Brunt, RN

## 2015-06-08 NOTE — Progress Notes (Signed)
OT Cancellation Note  Patient Details Name: Phillip [name] MRN: [id number] DOB: [birth year]   Cancelled Treatment:    Reason Eval/Treat Not Completed: Medical issues which prohibited therapy (bedrest)  Peri Maris  Pager: [phone]  06/08/2015, 6:42 AM
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
## 2015-06-08 NOTE — Progress Notes (Signed)
PT Cancellation Note  Patient Details Name: Phillip [name] MRN: [id number] DOB: 1966-05-25   Cancelled Treatment:    Reason Eval/Treat Not Completed: Patient not medically ready (bedrest) Will follow-up for physical therapy evaluation when patient is medically ready.  Ellouise Newer 06/08/2015, 8:21 AM    Elayne Snare, [place]
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
## 2015-06-08 NOTE — Progress Notes (Signed)
*  PRELIMINARY RESULTS* Vascular Ultrasound Lower extremity venous duplex has been completed.  Preliminary findings: negative for DVT  Landry Mellow, RDMS, RVT  06/08/2015, 5:26 PM
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
## 2015-06-09 LAB — COMPREHENSIVE METABOLIC PANEL
ALBUMIN: 2.1 g/dL — AB (ref 3.5–5.0)
ALK PHOS: 77 U/L (ref 38–126)
ALT: 64 U/L — ABNORMAL HIGH (ref 17–63)
AST: 39 U/L (ref 15–41)
Anion gap: 7 (ref 5–15)
BUN: 10 mg/dL (ref 6–20)
CALCIUM: 8.6 mg/dL — AB (ref 8.9–10.3)
CO2: 28 mmol/L (ref 22–32)
CREATININE: 0.78 mg/dL (ref 0.61–1.24)
Chloride: 104 mmol/L (ref 101–111)
Glucose, Bld: 88 mg/dL (ref 65–99)
Potassium: 3.6 mmol/L (ref 3.5–5.1)
SODIUM: 139 mmol/L (ref 135–145)
Total Bilirubin: 0.6 mg/dL (ref 0.3–1.2)
Total Protein: 6.1 g/dL — ABNORMAL LOW (ref 6.5–8.1)

## 2015-06-09 LAB — GLUCOSE, CAPILLARY
GLUCOSE-CAPILLARY: 113 mg/dL — AB (ref 65–99)
GLUCOSE-CAPILLARY: 85 mg/dL (ref 65–99)

## 2015-06-09 LAB — CBC
HCT: 25.6 % — ABNORMAL LOW (ref 39.0–52.0)
Hemoglobin: 8.6 g/dL — ABNORMAL LOW (ref 13.0–17.0)
MCH: 31 pg (ref 26.0–34.0)
MCHC: 33.6 g/dL (ref 30.0–36.0)
MCV: 92.4 fL (ref 78.0–100.0)
Platelets: 522 10*3/uL — ABNORMAL HIGH (ref 150–400)
RBC: 2.77 MIL/uL — AB (ref 4.22–5.81)
RDW: 11.9 % (ref 11.5–15.5)
WBC: 7.5 10*3/uL (ref 4.0–10.5)

## 2015-06-09 LAB — VANCOMYCIN, TROUGH: Vancomycin Tr: 13 ug/mL (ref 10.0–20.0)

## 2015-06-09 MED ORDER — VANCOMYCIN HCL 10 G IV SOLR
1250.0000 mg | Freq: Two times a day (BID) | INTRAVENOUS | Status: DC
  Administered 2015-06-10: 1250 mg via INTRAVENOUS
  Filled 2015-06-09 (×2): qty 1250

## 2015-06-09 NOTE — Progress Notes (Signed)
OT Cancellation Note  Patient Details Name: Phillip [name] MRN: [id number] DOB: Jun 19, 1966   Cancelled Treatment:    Reason Eval/Treat Not Completed: Medical issues which prohibited therapy (bedrest)  Hortencia Pilar 06/09/2015, 8:43 AM
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
## 2015-06-09 NOTE — Progress Notes (Signed)
ANTIBIOTIC CONSULT NOTE - FOLLOW UP  Pharmacy Consult for Vancomycin Indication: Fever/bactermia  No Known Allergies  Patient Measurements: Height: 5\' 9"  (175.3 cm) Weight: 168 lb (76.204 kg) IBW/kg (Calculated) : 70.7 Adjusted Body Weight: n/a  Vital Signs: Temp: 99.9 F (37.7 C) (06/30 0945) Temp Source: Oral (06/30 0945) BP: 121/69 mmHg (06/30 0945) Pulse Rate: 86 (06/30 0945) Intake/Output from previous day: 06/29 0701 - 06/30 0700 In: -  Out: 4800 [Urine:4800] Intake/Output from this shift:    Labs:  Recent Labs  06/06/15 2330 06/07/15 0529 06/07/15 0615 06/08/15 0625 06/09/15 0428  WBC 9.9  --   --  7.3 7.5  HGB 8.2*  --   --  7.7* 8.6*  PLT 471*  --   --  462* 522*  LABCREA  --  64.61  --   --   --   CREATININE 1.37*  --  1.16 0.93 0.78   Estimated Creatinine Clearance: 115.4 mL/min (by C-G formula based on Cr of 0.78).  Recent Labs  06/09/15 1600  [place] 13     Microbiology: Recent Results (from the past 720 hour(s))  MRSA PCR Screening     Status: None   Collection Time: 05/24/15  1:50 AM  Result Value Ref Range Status   MRSA by PCR NEGATIVE NEGATIVE Final    Comment:        The GeneXpert MRSA Assay (FDA approved for NASAL specimens only), is one component of a comprehensive MRSA colonization surveillance program. It is not intended to diagnose MRSA infection nor to guide or monitor treatment for MRSA infections.   Culture, blood (routine x 2)     Status: None (Preliminary result)   Collection Time: 06/06/15 11:30 PM  Result Value Ref Range Status   Specimen Description BLOOD RIGHT FOREARM  Final   Special Requests BOTTLES DRAWN AEROBIC AND ANAEROBIC 5CC EA  Final   Culture  Setup Time   Final    GRAM POSITIVE COCCI IN CLUSTERS IN BOTH AEROBIC AND ANAEROBIC BOTTLES CRITICAL RESULT CALLED TO, READ BACK BY AND VERIFIED WITH: S. HOCUTT RN [phone] GREEN R CONFIRMED BY R. HOLMES    Culture   Final    GRAM POSITIVE  COCCI CULTURE REINCUBATED FOR BETTER GROWTH    Report Status PENDING  Incomplete  Culture, blood (routine x 2)     Status: None (Preliminary result)   Collection Time: 06/07/15 12:06 AM  Result Value Ref Range Status   Specimen Description BLOOD RIGHT ARM  Final   Special Requests BOTTLES DRAWN AEROBIC AND ANAEROBIC 5CC EA  Final   Culture NO GROWTH 2 DAYS  Final   Report Status PENDING  Incomplete  Urine culture     Status: None   Collection Time: 06/07/15 12:37 AM  Result Value Ref Range Status   Specimen Description URINE, CATHETERIZED  Final   Special Requests NONE  Final   Culture 2,000 COLONIES/mL INSIGNIFICANT GROWTH  Final   Report Status 06/08/2015 FINAL  Final  Clostridium Difficile by PCR (not at [facility])     Status: Abnormal   Collection Time: 06/08/15 12:49 PM  Result Value Ref Range Status   C difficile by pcr POSITIVE (A) NEGATIVE Final    Comment: CRITICAL RESULT CALLED TO, READ BACK BY AND VERIFIED WITH: Madelyn Brunner RN 14:10 06/08/15 ([email or web address])     Anti-infectives    Start     Dose/Rate Route Frequency Ordered Stop   06/10/15 0300  vancomycin (VANCOCIN) 1,250 mg in sodium chloride  0.9 % 250 mL IVPB     1,250 mg 166.7 mL/hr over 90 Minutes Intravenous Every 12 hours 06/09/15 1737     06/08/15 1500  metroNIDAZOLE (FLAGYL) tablet 500 mg     500 mg Oral 3 times per day 06/08/15 1423 06/22/15 1359   06/07/15 0430  vancomycin (VANCOCIN) IVPB 1000 mg/200 mL premix  Status:  Discontinued     1,000 mg 200 mL/hr over 60 Minutes Intravenous Every 12 hours 06/07/15 0418 06/09/15 1737   06/07/15 0430  piperacillin-tazobactam (ZOSYN) IVPB 3.375 g  Status:  Discontinued     3.375 g 12.5 mL/hr over 240 Minutes Intravenous 3 times per day 06/07/15 0418 06/08/15 1423      Assessment: 49 yo male on empiric antibiotics for fever of unknown origin, now with GPC in 2/2 BCx.  Vanc D#3 for fever, Flagyl D#2 for Cdiff - Tmax 101.7, WBC WNL, dose appropriate - hx quad, renal fxn may be  overestimated  Vanc 6/28>> Zosyn 6/28>>6/29 Flagyl 6/29>>  6/27 Blood - GPC in clusters 2/2 Ronalee Belts called lab but not yet updated in our system - likely CoNS) 6/28 Urine - NEG 6/29 Cdiff - POS  Goal of Therapy:  Vancomycin trough level 15-20 mcg/ml  Plan:  1. Increase vancomycin to 1250 mg q 12 hrs. 2. F/u final culture results.  Uvaldo Rising, BCPS  Clinical Pharmacist Pager [phone]  06/09/2015 5:44 PM
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
## 2015-06-09 NOTE — Clinical Social Work Note (Signed)
Clinical Social Worker met with patient's sister, [name] at length in reference to post-acute placement for SNF. Patient's family are agreeable to [facility] once patient is medically stable.  Glendon Axe, MSW, LCSWA [phone] 06/14/2015 10:27 AM

## 2015-06-10 LAB — HEPATITIS PANEL, ACUTE
HCV Ab: 0.4 s/co ratio (ref 0.0–0.9)
HEP A IGM: NEGATIVE
HEP B S AG: NEGATIVE
Hep B C IgM: NEGATIVE

## 2015-06-10 LAB — GLUCOSE, CAPILLARY
GLUCOSE-CAPILLARY: 91 mg/dL (ref 65–99)
Glucose-Capillary: 89 mg/dL (ref 65–99)

## 2015-06-10 LAB — LACTIC ACID, PLASMA: LACTIC ACID, VENOUS: 2 mmol/L (ref 0.5–2.0)

## 2015-06-10 LAB — FOLATE RBC
Folate, Hemolysate: 266.5 ng/mL
Folate, RBC: 1075 ng/mL (ref >498)
Hematocrit: 24.8 % — ABNORMAL LOW (ref 37.5–51.0)

## 2015-06-10 MED ORDER — ACETAMINOPHEN 500 MG PO TABS
1000.0000 mg | ORAL_TABLET | Freq: Once | ORAL | Status: AC
  Administered 2015-06-10: 1000 mg via ORAL
  Filled 2015-06-10: qty 2
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
## 2015-06-10 NOTE — Progress Notes (Addendum)
Occupational Therapy Evaluation Patient Details Name: Phillip [name] MRN: [id number] DOB: [birth year] Today's Date: 06/10/2015    History of Present Illness 49 year old African-American male with recent cervical spine injury, status post fusion surgery on June 20, quadriplegia as a result of the spinal injury, presented with altered mental status, fever and diarrhea. Patient was was living in a skilled nursing facility. preliminary blood cultures show gram positive cocci in clusters and he is positive for C. Diff.AMS likely secondary to infection and now improved.   Clinical Impression   PTA, pt at [facility]. Pt has made improvement since last OT note. See below. Pt is an excellent CIR candidate, but apparently caregiver support is limited at this time after D/C, therefore plan is to D/C to SNF. Pt total A with all ADL at this time. Will begin to modify equipment and utilize AE and positioning to begin to increase pt's independence with self care. Will return this pm. Notified nsg of need for B PRAFO boots to prevent ankle contractures and improve positioning for future w/c positioning.     Follow Up Recommendations  Supervision/Assistance - 24 hour;SNF (plan is to D/C to [facility])    Equipment Recommendations  Other (comment) (AE; TBD at next venue)    Recommendations for Other Services       Precautions / Restrictions Precautions Precautions: Cervical;Fall Precaution Comments: C6 quad and hard cervical collar Restrictions RLE Weight Bearing: Non weight bearing      Mobility Bed Mobility               General bed mobility comments: total a +2  Transfers                 General transfer comment: not assessed    Balance                                            ADL                                       Functional mobility during ADLs: Total assistance;+2 for physical assistance General ADL Comments: total  A with all ADL. Pt states he has not been receiving therapy at SNF. Pt has soft touch call bell in room but unable to use at this time. Will explore position options. Pt expressed desire to be able to feed self. Discussed need for pt to work on exercises on his own.      Vision     Perception     Praxis      Pertinent Vitals/Pain Pain Assessment: Faces Faces Pain Scale: Hurts little more Pain Location: neck Pain Descriptors / Indicators: Grimacing Pain Intervention(s): Limited activity within patient's tolerance;Monitored during session;Repositioned     Hand Dominance Right   Extremity/Trunk Assessment Upper Extremity Assessment Upper Extremity Assessment: RUE deficits/detail;LUE deficits/detail RUE Deficits / Details: deltoid 3+/5; biceps 4-/5; sup 3/5; pron 3-/5; wrist ext 3/5. triceps 2/5; no hand function. developing tenodesis RUE Coordination: decreased fine motor;decreased gross motor (no hand function) LUE Deficits / Details: deltoid 3/5; biceps 3+/5; tripceps 1/5 sup 3/5;wrist 2-/5. no hand funtion LUE Coordination: decreased fine motor;decreased gross motor   Lower Extremity Assessment Lower Extremity Assessment:  (no movement; spasms; B foot drop) Needs B PRAFO boots to prevent contractures and  improve positioning for w/c positioning   Cervical / Trunk Assessment Cervical / Trunk Assessment: Other exceptions Cervical / Trunk Exceptions: hard collar. moving attempting to move within the collar   Communication Communication Communication: Other (comment) (soft vocal quality)   Cognition Arousal/Alertness: Awake/alert Behavior During Therapy: Flat affect Overall Cognitive Status: Within Functional Limits for tasks assessed                     General Comments   Pt states he has not received any rehab services at [facility] and appears depressed. Affect brightened when discussing possible AE to try.     Exercises       Shoulder Instructions       Home Living Family/patient expects to be discharged to:: Skilled nursing facility                                        Prior Functioning/Environment Level of Independence: Independent        Comments: worked as Biomedical scientist at [employer] Diagnosis: Generalized weakness;Paresis;Acute pain   OT Problem List: Decreased strength;Decreased range of motion;Decreased activity tolerance;Impaired balance (sitting and/or standing);Decreased coordination;Decreased knowledge of use of DME or AE;Impaired tone;Impaired UE functional use;Pain   OT Treatment/Interventions: Self-care/ADL training;Therapeutic exercise;Neuromuscular education;DME and/or AE instruction;Therapeutic activities;Splinting;Patient/family education;Balance training    OT Goals(Current goals can be found in the care plan section) Acute Rehab OT Goals Patient Stated Goal: to feed myself OT Goal Formulation: With patient Time For Goal Achievement: 06/24/15 Potential to Achieve Goals: Good  OT Frequency: Min 3X/week   Barriers to D/C: Decreased caregiver support          Co-evaluation              End of Session Equipment Utilized During Treatment: Cervical collar Nurse Communication: Mobility status;Other (comment) (location of call bell placement)  Activity Tolerance: Patient tolerated treatment well [name];with call bell/phone within reach;with bed alarm set   Time: 1420-1443 OT Time Calculation (min): 23 min Charges:  OT General Charges $OT Visit: 1 Procedure OT Evaluation $Initial OT Evaluation Tier I: 1 Procedure OT Treatments $Self Care/Home Management : 8-22 mins G-Codes:    Aiyannah Fayad,HILLARY 07/04/2015, 4:13 PM   [facility], OTR/L  [phone] 04-Jul-2015

## 2015-06-10 NOTE — Clinical Social Work Placement (Signed)
   CLINICAL SOCIAL WORK PLACEMENT  NOTE  Date:  06/10/2015  Patient Details  Name: Phillip [name] MRN: [id number] Date of Birth: 12-15-1965  Clinical Social Work is seeking post-discharge placement for this patient at the [place] level of care (*CSW will initial, date and re-position this form in  chart as items are completed):  Yes   Patient/family provided with [place] Work Department's list of facilities offering this level of care within the geographic area requested by the patient (or if unable, by the patient's family).  Yes   Patient/family informed of their freedom to choose among providers that offer the needed level of care, that participate in Medicare, Medicaid or managed care program needed by the patient, have an available bed and are willing to accept the patient.  Yes   Patient/family informed of [facility]'s ownership interest in [facility] and [facility], as well as of the fact that they are under no obligation to receive care at these facilities.  PASRR submitted to EDS on       PASRR number received on       Existing PASRR number confirmed on 06/09/15     FL2 transmitted to all facilities in geographic area requested by pt/family on 06/09/15     FL2 transmitted to all facilities within larger geographic area on       Patient informed that his/her managed care company has contracts with or will negotiate with certain facilities, including the following:        Yes   Patient/family informed of bed offers received.  Patient chooses bed at [facility]     Physician recommends and patient chooses bed at      Patient to be transferred to [facility] on  .  Patient to be transferred to facility by       Patient family notified on   of transfer.  Name of family member notified:        PHYSICIAN       Additional Comment:    _______________________________________________ Greta Doom,  LCSW 06/10/2015, 11:23 AM

## 2015-06-10 NOTE — Clinical Social Work Note (Signed)
CSW confirmed with the pt that he does not want to return back to [facility]. Pt selected [facility]. CSW confirmed with Santiago Glad from [facility] that they can accept a weekend discharge.   [place], MSW, [place]

## 2015-06-11 ENCOUNTER — Inpatient Hospital Stay (HOSPITAL_COMMUNITY): Payer: 59

## 2015-06-11 ENCOUNTER — Encounter (HOSPITAL_COMMUNITY): Payer: Self-pay | Admitting: *Deleted

## 2015-06-11 LAB — URINE MICROSCOPIC-ADD ON

## 2015-06-11 LAB — URINALYSIS, ROUTINE W REFLEX MICROSCOPIC
Bilirubin Urine: NEGATIVE
Glucose, UA: NEGATIVE mg/dL
Ketones, ur: NEGATIVE mg/dL
Nitrite: NEGATIVE
PROTEIN: NEGATIVE mg/dL
Specific Gravity, Urine: 1.007 (ref 1.005–1.030)
UROBILINOGEN UA: 0.2 mg/dL (ref 0.0–1.0)
pH: 7 (ref 5.0–8.0)

## 2015-06-11 LAB — CULTURE, BLOOD (ROUTINE X 2)

## 2015-06-11 LAB — GLUCOSE, CAPILLARY: GLUCOSE-CAPILLARY: 85 mg/dL (ref 65–99)

## 2015-06-11 MED ORDER — IOHEXOL 300 MG/ML  SOLN
25.0000 mL | INTRAMUSCULAR | Status: AC
  Administered 2015-06-11 (×2): 25 mL via ORAL

## 2015-06-11 MED ORDER — VANCOMYCIN 50 MG/ML ORAL SOLUTION
125.0000 mg | Freq: Four times a day (QID) | ORAL | Status: DC
  Administered 2015-06-11 – 2015-06-17 (×26): 125 mg via ORAL
  Filled 2015-06-11 (×30): qty 2.5

## 2015-06-11 MED ORDER — IOHEXOL 300 MG/ML  SOLN
100.0000 mL | Freq: Once | INTRAMUSCULAR | Status: AC | PRN
  Administered 2015-06-11: 100 mL via INTRAVENOUS

## 2015-06-11 NOTE — Progress Notes (Signed)
Ankle incisions ok Please remove sutures Sunday and steristrip

## 2015-06-11 NOTE — Progress Notes (Addendum)
[facility] PROGRESS NOTE  Phillip [name] ION:[id number] DOB: March 19, 1966 DOA: 06/06/2015  Brief HPI: 49 year old African-American male with recent cervical spine injury, status post fusion surgery on June 20, quadriplegia as a result of the spinal injury, presented with altered mental status, fever and diarrhea. Patient was was living in a skilled nursing facility. Apparently, his symptoms had been ongoing for 3 days. He was hospitalized for further management.  Past medical history:  Past Medical History  Diagnosis Date  . DJD (degenerative joint disease)   . Seizures   . Broken hip   . Cervical spinal cord injury   . Tobacco abuse     Consultants: Neurology  Procedures: None.  Antibiotics: Zosyn 6/28-6/29 Vancomycin 6/28-7/1 Oral Flagyl. 629--7/2 Oral vancomycin 7/2  Subjective: Patient denies any complaints. Diarrhea seems to be slowing down. However, he continues to have fever.   Objective: Vital Signs  Filed Vitals:   06/10/15 1841 06/10/15 2104 06/11/15 0224 06/11/15 0524  BP: 154/86 159/98 127/71 155/87  Pulse: 87 88 86 87  Temp: 98.1 F (36.7 C) 100 F (37.8 C) 100.8 F (38.2 C) 100.6 F (38.1 C)  TempSrc: Oral Oral Oral Oral  Resp: 20 18 18 18   Height:      Weight:      SpO2: 99% 100% 99% 98%    Intake/Output Summary (Last 24 hours) at 06/11/15 0833 Last data filed at 06/11/15 0525  Gross per 24 hour  Intake      3 ml  Output   4300 ml  Net  -4297 ml   Filed Weights   06/06/15 2253 06/07/15 0400 06/10/15 0512  Weight: 74.844 kg (165 lb) 76.204 kg (168 lb) 77.293 kg (170 lb 6.4 oz)    General appearance: alert, cooperative, appears stated age and no distress. Neck collar is noted. Resp: clear to auscultation bilaterally Cardio: regular rate and rhythm, S1, S2 normal, no murmur, click, rub or gallop GI: soft, mildly tender in the lower quadrants without any rebound, rigidity or guarding. bowel sounds normal; no masses,  no  organomegaly Neurologic: Some movement appreciated in the upper extremities. None in the lower extremities. Incision site in the right lower extremity is clean.  Lab Results:  Basic Metabolic Panel:  Recent Labs Lab 06/06/15 2330 06/07/15 0615 06/08/15 0625 06/09/15 0428  NA 142 141 141 139  K 3.8 3.4* 3.2* 3.6  CL 103 108 103 104  CO2 27 25 28 28   GLUCOSE 100* 98 98 88  BUN 28* 24* 14 10  CREATININE 1.37* 1.16 0.93 0.78  CALCIUM 8.6* 8.1* 8.5* 8.6*  MG  --   --  1.6*  --    Liver Function Tests:  Recent Labs Lab 06/06/15 2330 06/07/15 0615 06/08/15 0625 06/09/15 0428  AST 100* 73* 42* 39  ALT 98* 85* 67* 64*  ALKPHOS 99 90 75 77  BILITOT 0.6 0.5 0.6 0.6  PROT 6.5 6.1* 5.8* 6.1*  ALBUMIN 2.4* 2.2* 1.9* 2.1*    Recent Labs Lab 06/07/15 1127  AMMONIA 35   CBC:  Recent Labs Lab 06/06/15 2330 06/08/15 0625 06/09/15 0428  WBC 9.9 7.3 7.5  NEUTROABS 7.0  --   --   HGB 8.2* 7.7* 8.6*  HCT 24.9* 23.5* 25.6*  24.8*  MCV 95.4 94.8 92.4  PLT 471* 462* 522*   Cardiac Enzymes:  Recent Labs Lab 06/06/15 2330  CKTOTAL 132   CBG:  Recent Labs Lab 06/09/15 2440 06/09/15 2035 06/10/15 1027 06/10/15 1216 06/11/15 2536  GLUCAP 113* 85 89 91 85    Recent Results (from the past 240 hour(s))  Culture, blood (routine x 2)     Status: None (Preliminary result)   Collection Time: 06/06/15 11:30 PM  Result Value Ref Range Status   Specimen Description BLOOD RIGHT FOREARM  Final   Special Requests BOTTLES DRAWN AEROBIC AND ANAEROBIC 5CC EA  Final   Culture  Setup Time   Final    GRAM POSITIVE COCCI IN CLUSTERS IN BOTH AEROBIC AND ANAEROBIC BOTTLES CRITICAL RESULT CALLED TO, READ BACK BY AND VERIFIED WITH: S. HOCUTT RN [phone] GREEN R CONFIRMED BY R. HOLMES    Culture   Final    STAPHYLOCOCCUS SPECIES (COAGULASE NEGATIVE) CULTURE REINCUBATED FOR BETTER GROWTH    Report Status PENDING  Incomplete  Culture, blood (routine x 2)     Status: None  (Preliminary result)   Collection Time: 06/07/15 12:06 AM  Result Value Ref Range Status   Specimen Description BLOOD RIGHT ARM  Final   Special Requests BOTTLES DRAWN AEROBIC AND ANAEROBIC 5CC EA  Final   Culture NO GROWTH 3 DAYS  Final   Report Status PENDING  Incomplete  Urine culture     Status: None   Collection Time: 06/07/15 12:37 AM  Result Value Ref Range Status   Specimen Description URINE, CATHETERIZED  Final   Special Requests NONE  Final   Culture 2,000 COLONIES/mL INSIGNIFICANT GROWTH  Final   Report Status 06/08/2015 FINAL  Final  Clostridium Difficile by PCR (not at [facility])     Status: Abnormal   Collection Time: 06/08/15 12:49 PM  Result Value Ref Range Status   C difficile by pcr POSITIVE (A) NEGATIVE Final    Comment: CRITICAL RESULT CALLED TO, READ BACK BY AND VERIFIED WITH: Madelyn Brunner RN 14:10 06/08/15 ([email or web address])       Studies/Results: No results found.  Medications:  Scheduled: . feeding supplement (ENSURE ENLIVE)  237 mL Oral BID BM  . heparin  5,000 Units Subcutaneous 3 times per day  . multivitamin with minerals  1 tablet Oral Daily  . neomycin-bacitracin-polymyxin  1 application Topical BID  . nicotine  21 mg Transdermal Daily  . QUEtiapine  25 mg Oral QHS  . saccharomyces boulardii  250 mg Oral BID  . sodium chloride  3 mL Intravenous Q12H  . vancomycin  125 mg Oral 4 times per day  . vitamin B-12  1,000 mcg Oral Daily   Continuous: . 0.9 % NaCl with KCl 20 mEq / L 30 mL/hr (06/10/15 2119)   KZS:[id number], oxyCODONE-acetaminophen  Assessment/Plan:  Principal Problem:   Acute encephalopathy Active Problems:   Tobacco abuse   Cervical spinal cord injury   Spinal cord injury at C5-C7 level without injury of spinal bone   Forehead laceration   Ankle fracture, right   Quadriplegia   Hallucinations   Seizures   Bacteremia   Normocytic anemia   C. difficile diarrhea    Acute encephalopathy He has improved and is now back to baseline.  This was likely secondary to his acute infection. EEG negative for seizure predisposition, mild slowing. Ammonia--35. B12 219, TSH 1.64, HIV nonreactive. Folic acid level is normal. Continue supplementation with vitamin B-12. CT brain negative for acute intracranial abnormalities. Seroquel was discontinued. See below as well.  C. difficile diarrhea Diarrhea seems to be improving. However, patient continues to have fever. Unclear if this is suggestive of treatment failure with Flagyl or if there is another reason for his  fever. We have ruled out other infectious etiologies. At this time, we will change his Flagyl to oral vancomycin. Enteric precautions. Probiotics.  Fever Fever persists. Bacteria in the blood cultures, more suggestive of contaminant. No other infectious etiology has been found. If he continues to have fever despite change to oral vancomycin, he may need further workup. His abdomen is mildly tender but does not warrant any imaging studies at this time. May need to reevaluate if fever persists. Blood cultures were growing gram-positive cocci in clusters in 1 out of 2. Final identification suggests coagulase-negative staph Aureus. Lactic acid was normal. Continue to monitor for now. MRI of the cervical spine shows no evidence for infection. Procalcitonin 0.33. Initial Lactic acid was 0.64.   History of seizure disorder  Patient is not on any AEDs. CPK 132.  Hx of C spinal cord injury and s/p of C-spine fusion surgery:  Maintain cervical collar. No evidence for infection on MRI.  Mild AKI/hypokalemia/hypomagnesemia Likely due to volume depletion. Improved with IV fluids. Replace potassium and magnesium.  Normocytic anemia Drop in hemoglobin was noted. Now hemoglobin is stable. No overt bleeding identified. Continue to monitor. Continue vitamin B-12 supplementation.  Suprapubic mass secondary to distended bladder Resolved after Foley catheter placement. We will need to do voiding  trial once he is better from his acute infection.  Right ankle fracture  Status post ORIF 05/27/2015. Surgical site looks clean without any drainage or erythema   Lower extremity edema and pain  No DVT noted on Doppler study.  Hx of Tobacco abuse no tobacco since last hospitalization  ADDENDUM Patient continues to spike fever. Will repeat blood cultures. CXR. UA. Order Ct Abd/pelvis. Other VSS.  DVT Prophylaxis: Subcutaneous heparin    Code Status: Full code  Family Communication: Discussed with patient. Discussed with his sister 6/30.  Disposition Plan: Will likely return to skilled nursing facility at the time of discharge. Await resolution of fever.    LOS: 4 days   [place] [facility] Pager [phone] 06/11/2015, 8:33 AM  If 7PM-7AM, please contact night-coverage at [email or web address], password [facility]

## 2015-06-11 NOTE — Progress Notes (Signed)
Occupational Therapy Treatment Patient Details Name: Phillip [name] MRN: [id number] DOB: 1966/04/18 Today's Date: 06/11/2015    History of present illness 49 year old African-American male with recent cervical spine injury, status post fusion surgery on June 20, quadriplegia as a result of the spinal injury, presented with altered mental status, fever and diarrhea. Patient was was living in a skilled nursing facility. preliminary blood cultures show gram positive cocci in clusters and he is positive for C. Diff.AMS likely secondary to infection and now improved.   OT comments  Patient progressing slowly towards OT goals. Pt motivated to increase independence with BADLs, especially self-feeding. Encouraged patient to advocate for his own care and explained that it's his responsibility to call and direct care prn and appropriately.    Follow Up Recommendations  Supervision/Assistance - 24 hour;SNF    Equipment Recommendations  Other (comment) (TBD next venue of care)    Recommendations for Other Services  None at this time  Precautions / Restrictions Precautions Precautions: Cervical;Fall Precaution Comments: C6 quad and hard cervical collar Restrictions Weight Bearing Restrictions: Yes RLE Weight Bearing: Non weight bearing       Mobility Bed Mobility General bed mobility comments: pt stayed supine in bed for OT treatment  Transfers General transfer comment: not assessed        ADL Overall ADL's : Needs assistance/impaired Eating/Feeding: Maximal assistance;Bed level General ADL Comments: Engaged patient in BUE AAROM exercises, see exercises below. Assisted with bed positioning for optimal self-feeding position. Sat patient -> 47* in chair like position. Positioned water pitcher and straw so patient can reach for hydration, pt with smile when he realized he could drink on his own. Notified NT that pt wanting to eat, pt to at least bring food to mouth 5 times using WHO with  u-cuff.      Cognition   Behavior During Therapy: Flat affect (smiling when he realized he could drink on his own) Overall Cognitive Status: Within Functional Limits for tasks assessed     Exercises General Exercises - Upper Extremity Shoulder Flexion: AAROM;Both;10 reps;Supine Shoulder Extension: AAROM;Both;10 reps;Supine Elbow Flexion: AAROM;Both;10 reps;Supine Elbow Extension: AAROM;Both;10 reps;Supine Wrist Flexion: AAROM;Both;10 reps;Supine Wrist Extension: AAROM;Both;10 reps;Supine           Pertinent Vitals/ Pain       Pain Assessment: No/denies pain Pain Score: 0-No pain   Frequency Min 3X/week     Progress Toward Goals  OT Goals(current goals can now befound in the care plan section)  Progress towards OT goals: Progressing toward goals     Plan Discharge plan remains appropriate    End of Session Equipment Utilized During Treatment: Cervical collar   Activity Tolerance Patient tolerated treatment well   Patient Left in bed;with call bell/phone within reach;with nursing/sitter in room  Nurse Communication Other (comment) (Assistance needed for self-feeding and importance of putting water pitcher & straw within patient's reach)     Time: 4888-9169 OT Time Calculation (min): 42 min  Charges: OT General Charges $OT Visit: 1 Procedure OT Treatments $Self Care/Home Management : 8-22 mins $Therapeutic Exercise: 23-37 mins  Allyssa Abruzzese , MS, OTR/L, CLT Pager: [phone]   06/11/2015, 2:14 PM

## 2015-06-12 DIAGNOSIS — Z981 Arthrodesis status: Secondary | ICD-10-CM

## 2015-06-12 DIAGNOSIS — B957 Other staphylococcus as the cause of diseases classified elsewhere: Secondary | ICD-10-CM

## 2015-06-12 DIAGNOSIS — R945 Abnormal results of liver function studies: Secondary | ICD-10-CM

## 2015-06-12 DIAGNOSIS — F17210 Nicotine dependence, cigarettes, uncomplicated: Secondary | ICD-10-CM

## 2015-06-12 LAB — CULTURE, BLOOD (ROUTINE X 2): Culture: NO GROWTH

## 2015-06-12 LAB — BASIC METABOLIC PANEL
ANION GAP: 8 (ref 5–15)
BUN: 10 mg/dL (ref 6–20)
CO2: 26 mmol/L (ref 22–32)
Calcium: 8.8 mg/dL — ABNORMAL LOW (ref 8.9–10.3)
Chloride: 102 mmol/L (ref 101–111)
Creatinine, Ser: 0.77 mg/dL (ref 0.61–1.24)
GFR calc non Af Amer: >60 mL/min (ref >60)
Glucose, Bld: 106 mg/dL — ABNORMAL HIGH (ref 65–99)
Potassium: 4.2 mmol/L (ref 3.5–5.1)
SODIUM: 136 mmol/L (ref 135–145)

## 2015-06-12 LAB — CBC
HEMATOCRIT: 26.3 % — AB (ref 39.0–52.0)
Hemoglobin: 8.5 g/dL — ABNORMAL LOW (ref 13.0–17.0)
MCH: 29.7 pg (ref 26.0–34.0)
MCHC: 32.3 g/dL (ref 30.0–36.0)
MCV: 92 fL (ref 78.0–100.0)
PLATELETS: 527 10*3/uL — AB (ref 150–400)
RBC: 2.86 MIL/uL — ABNORMAL LOW (ref 4.22–5.81)
RDW: 12 % (ref 11.5–15.5)
WBC: 7.3 10*3/uL (ref 4.0–10.5)

## 2015-06-12 LAB — GLUCOSE, CAPILLARY: Glucose-Capillary: 125 mg/dL — ABNORMAL HIGH (ref 65–99)

## 2015-06-12 MED ORDER — PIPERACILLIN-TAZOBACTAM 3.375 G IVPB
3.3750 g | Freq: Three times a day (TID) | INTRAVENOUS | Status: DC
  Administered 2015-06-12 – 2015-06-16 (×13): 3.375 g via INTRAVENOUS
  Filled 2015-06-12 (×15): qty 50

## 2015-06-12 MED ORDER — VANCOMYCIN HCL 10 G IV SOLR
1250.0000 mg | Freq: Two times a day (BID) | INTRAVENOUS | Status: DC
  Administered 2015-06-12 – 2015-06-16 (×9): 1250 mg via INTRAVENOUS
  Filled 2015-06-12 (×10): qty 1250

## 2015-06-12 NOTE — Progress Notes (Signed)
ANTIBIOTIC CONSULT NOTE - FOLLOW UP  Pharmacy Consult for Vancomycin Indication: HCAP  No Known Allergies  Patient Measurements: Height: 5\' 9"  (175.3 cm) Weight: 170 lb 6.4 oz (77.293 kg) IBW/kg (Calculated) : 70.7  Vital Signs: Temp: 99.9 F (37.7 C) (07/03 1000) Temp Source: Oral (07/03 1000) BP: 118/67 mmHg (07/03 1000) Pulse Rate: 80 (07/03 1000) Intake/Output from previous day: 07/02 0701 - 07/03 0700 In: 360 [P.O.:360] Out: 5450 [Urine:5450] Intake/Output from this shift: Total I/O In: 600 [P.O.:600] Out: 775 [Urine:775]  Labs:  Recent Labs  06/12/15 0733  WBC 7.3  HGB 8.5*  PLT 527*  CREATININE 0.77   Estimated Creatinine Clearance: 115.4 mL/min (by C-G formula based on Cr of 0.77).  Recent Labs  06/09/15 1600  [place] 13     Microbiology: Recent Results (from the past 720 hour(s))  MRSA PCR Screening     Status: None   Collection Time: 05/24/15  1:50 AM  Result Value Ref Range Status   MRSA by PCR NEGATIVE NEGATIVE Final    Comment:        The GeneXpert MRSA Assay (FDA approved for NASAL specimens only), is one component of a comprehensive MRSA colonization surveillance program. It is not intended to diagnose MRSA infection nor to guide or monitor treatment for MRSA infections.   Culture, blood (routine x 2)     Status: None   Collection Time: 06/06/15 11:30 PM  Result Value Ref Range Status   Specimen Description BLOOD RIGHT FOREARM  Final   Special Requests BOTTLES DRAWN AEROBIC AND ANAEROBIC 5CC EA  Final   Culture  Setup Time   Final    GRAM POSITIVE COCCI IN CLUSTERS IN BOTH AEROBIC AND ANAEROBIC BOTTLES CRITICAL RESULT CALLED TO, READ BACK BY AND VERIFIED WITH: S. HOCUTT RN [phone] GREEN R CONFIRMED BY R. HOLMES    Culture   Final    STAPHYLOCOCCUS SPECIES (COAGULASE NEGATIVE) THE SIGNIFICANCE OF ISOLATING THIS ORGANISM FROM A SINGLE SET OF BLOOD CULTURES WHEN MULTIPLE SETS ARE DRAWN IS UNCERTAIN. PLEASE NOTIFY THE  MICROBIOLOGY DEPARTMENT WITHIN ONE WEEK IF SPECIATION AND SENSITIVITIES ARE REQUIRED.    Report Status 06/11/2015 FINAL  Final  Culture, blood (routine x 2)     Status: None (Preliminary result)   Collection Time: 06/07/15 12:06 AM  Result Value Ref Range Status   Specimen Description BLOOD RIGHT ARM  Final   Special Requests BOTTLES DRAWN AEROBIC AND ANAEROBIC 5CC EA  Final   Culture NO GROWTH 4 DAYS  Final   Report Status PENDING  Incomplete  Urine culture     Status: None   Collection Time: 06/07/15 12:37 AM  Result Value Ref Range Status   Specimen Description URINE, CATHETERIZED  Final   Special Requests NONE  Final   Culture 2,000 COLONIES/mL INSIGNIFICANT GROWTH  Final   Report Status 06/08/2015 FINAL  Final  Clostridium Difficile by PCR (not at [facility])     Status: Abnormal   Collection Time: 06/08/15 12:49 PM  Result Value Ref Range Status   C difficile by pcr POSITIVE (A) NEGATIVE Final    Comment: CRITICAL RESULT CALLED TO, READ BACK BY AND VERIFIED WITH: Madelyn Brunner RN 14:10 06/08/15 ([email or web address])      Assessment: Vanc and Zosyn to start for HCAP Flagyl D#5 for Cdiff - Tmax 101.7, WBC WNL, dose appropriate - hx quad, renal fxn may be overestimated  Vanciomycin IV 6/28>>6/30.  7/3>> Vancomycin po 7/2>> Zosyn 6/28>>6/29   7/3>> Flagyl 6/29>>7/2  6/27  Blood - GPC in clusters 2/2 Ronalee Belts called lab but not yet updated in our system - likely CoNS) 6/28 Urine - NEG 6/29 Cdiff - POS  6/30 VT 13 on 1g IV q12h  Goal of Therapy:  Vancomycin trough level 15-20 mcg/ml  Plan:  Vancomycin 1250mg  IV q12h Zosyn 3.375g IV q8h (infuse over 4 hours) Monitor culture results and renal function.  Heide Guile, PharmD, BCPS-AQ ID Clinical Pharmacist Pager [phone]   06/12/2015 12:45 PM
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
## 2015-06-12 NOTE — Progress Notes (Signed)
Removed sutures and applied steri strips to right ankle surgical site. No bleeding. Pt denied pain or discomfort. Will continue to monitor.
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
## 2015-06-12 NOTE — Progress Notes (Signed)
No loose stools reported during this shift.

## 2015-06-13 DIAGNOSIS — J189 Pneumonia, unspecified organism: Secondary | ICD-10-CM | POA: Diagnosis present

## 2015-06-13 DIAGNOSIS — Y95 Nosocomial condition: Secondary | ICD-10-CM

## 2015-06-13 LAB — GLUCOSE, CAPILLARY: Glucose-Capillary: 93 mg/dL (ref 65–99)

## 2015-06-13 NOTE — Progress Notes (Signed)
Occupational Therapy Treatment Patient Details Name: Phillip [name] MRN: [id number] DOB: May 29, 1966 Today's Date: 06/13/2015    History of present illness 49 year old African-American male with recent cervical spine injury, status post fusion surgery on June 20, quadriplegia as a result of the spinal injury, presented with altered mental status, fever and diarrhea. Patient was was living in a skilled nursing facility. preliminary blood cultures show gram positive cocci in clusters and he is positive for C. Diff.AMS likely secondary to infection and now improved.   OT comments  Patient found seated in recliner. Patient encouraged to stay up, stating "I just want to stay here all day". Educated patient on importance of pressure relief for skin integrity and to decrease likeliness of skin breakdown/wounds. Patient engaged in self-feeding activity while seated in recliner, education provided regarding this. Therapist also engaged patient in AAROM/strengthening exercises while incorporating tenodesis grasp. Patient is very motivated to be as independent as possible and more than willing to work with therapist, this is the second time this therapist has seen him today. Changed d/c recommendation to CIR. Patient will benefit from extensive, interdisciplinary inpatient rehabilitation to decrease overall burden of care. Patient's motivation, drive, determination is a perfect fit for CIR.   Follow Up Recommendations  Supervision/Assistance - 24 hour;CIR    Equipment Recommendations  Other (comment) (TBD next venue of care)    Recommendations for Other Services Rehab consult    Precautions / Restrictions Precautions Precautions: Cervical;Fall Precaution Comments: C6 quad and hard cervical collar Restrictions Weight Bearing Restrictions: Yes RLE Weight Bearing: Non weight bearing Other Position/Activity Restrictions: Allow PROM to right ankle           ADL Overall ADL's : Needs  assistance/impaired Eating/Feeding: Maximal assistance;Sitting Eating/Feeding Details (indicate cue type and reason): hand over hand using WHO with universal cuff General ADL Comments: Assisted patient with self-feeding while seated in recliner. Also engaged patient in AAROM/strengthening exercises (see General UE exercise tab). Educated patient on importance of pressure relief to decrease pressure sores/wounds, encouraged every 30 minutes while in recliner and every 2 hours when in bed.      Cognition   Behavior During Therapy: Flat affect Overall Cognitive Status: Within Functional Limits for tasks assessed       Memory: Decreased short-term memory (decrease recall of events)      Awareness: Intellectual   General Comments: good recognition with some carry over of instructions from previous session ( use of soft touch call bell, use of straw)      Exercises General Exercises - Upper Extremity Elbow Flexion: AAROM;Both;10 reps;Seated (bringing cup to mouth) Elbow Extension: AAROM;Both;10 reps;Seated (bringing cup to mouth) Wrist Flexion: AAROM;Both;10 reps;Seated (tenodesis grasp and bringing cup to mouth) Wrist Extension: AAROM;Both;10 reps;Seated (tenodesis grasp and bringing cup to mouth)           Pertinent Vitals/ Pain       Pain Assessment: No/denies pain Pain Score: 0-No pain   Frequency Min 3X/week     Progress Toward Goals  OT Goals(current goals can now befound in the care plan section)  Progress towards OT goals: Progressing toward goals  Acute Rehab OT Goals Patient Stated Goal: to feed myself  Plan Discharge plan needs to be updated    Co-evaluation    PT/OT/SLP Co-Evaluation/Treatment: Yes Reason for Co-Treatment: Complexity of the patient's impairments (multi-system involvement);For patient/therapist safety   OT goals addressed during session: ADL's and self-care;Strengthening/ROM      End of Session Equipment Utilized During Treatment:  Cervical  collar   Activity Tolerance Patient tolerated treatment well   Patient Left with call bell/phone within reach;in chair;with nursing/sitter in room   Nurse Communication Mobility status;Need for lift equipment     Time: 1410-3013 OT Time Calculation (min): 23 min  Charges: OT General Charges $OT Visit: 1 Procedure OT Treatments $Self Care/Home Management : 8-22 mins $Therapeutic Exercise: 8-22 mins  Freddie Nghiem , MS, OTR/L, CLT Pager: [phone]  06/13/2015, 1:31 PM

## 2015-06-13 NOTE — Progress Notes (Signed)
Rehab admissions - I received a call from Mardene Celeste with OT recommending inpatient rehab.  Patient was in the hospital and had a rehab consult on 05/31/15.  Admissions coordinator Danne Baxter followed up and recommendation was for SNF since patient had no caregiver support.  Patient has [employer].  It is doubtful that [facility] would pay for inpatient rehab and for SNF placement.  I will have Pamala Hurry follow up tomorrow.  She can be reached tomorrow at [phone]

## 2015-06-13 NOTE — Progress Notes (Signed)
Orthopedic Tech Progress Note Patient Details:  Phillip [name] [birth year] [id number] Brace order completed by bio-tech vendor. Patient ID: Phillip [name], male   DOB: [birth year], 49 y.o.   MRN: [id number]   Braulio Bosch 06/13/2015, 3:45 PM

## 2015-06-13 NOTE — Progress Notes (Signed)
Occupational Therapy Treatment Patient Details Name: Phillip [name] MRN: [id number] DOB: [birth year] Today's Date: 06/13/2015    History of present illness 49 year old African-American male with recent cervical spine injury, status post fusion surgery on June 20, quadriplegia as a result of the spinal injury, presented with altered mental status, fever and diarrhea. Patient was was living in a skilled nursing facility. preliminary blood cultures show gram positive cocci in clusters and he is positive for C. Diff.AMS likely secondary to infection and now improved.   OT comments  Patient making slow progress. Continue plan of care for now. During this OT/PT co-treat, engaged patient in bed mobility, sat EOB, and patient performed A/P transfer into recliner. Pt's BP readings were WNL and patient with minimal complaints of dizziness/lightheadedness. Positioned patient with pillows while seated in recliner, placed soft call bell under right elbow, and notified RN of needed lift equipment and educated on positioning of sling for back to bed transfer.    Follow Up Recommendations  Supervision/Assistance - 24 hour;SNF    Equipment Recommendations  Other (comment) (TBD next venue of care)    Recommendations for Other Services  None at this time   Precautions / Restrictions Precautions Precautions: Cervical;Fall Precaution Comments: C6 quad and hard cervical collar Restrictions Weight Bearing Restrictions: Yes RLE Weight Bearing: Non weight bearing Other Position/Activity Restrictions: Allow PROM to right ankle    Mobility Bed Mobility Overal bed mobility: Needs Assistance;+2 for physical assistance Bed Mobility: Rolling;Sidelying to Sit;Sit to Sidelying     Supine to sit: Max assist;+2 for physical assistance Sit to supine: Total assist;+2 for physical assistance   General bed mobility comments: Patient able to initiate UE use, and utilize UE to pull to sitting from bed with HOB  elevated  Transfers Overall transfer level: Needs assistance   Transfers: Comptroller transfers: Total assist;+2 physical assistance;From elevated surface   General transfer comment: Patient able to perform some stability reactions using UEs and trunk. Assisted patient via 4 part posterior slide using sheet to form sling    Balance Overall balance assessment: Needs assistance Sitting-balance support: Bilateral upper extremity supported;Feet unsupported Sitting balance-Leahy Scale: Zero Sitting balance - Comments: patient able to initiate weight shifts and UE support but continues to required moderate to maximial assist to maintain balaance in sitting position    ADL Overall ADL's : Needs assistance/impaired General ADL Comments: Assisted patient from bed -> recliner total max assist +2 (A/P transfer). Encouraged patient to assist using BUEs. Positioned patient in recliner with pillows. Plan to assist pt with self-feeding using AE during next session.    Cognition   Behavior During Therapy: Flat affect Overall Cognitive Status: Within Functional Limits for tasks assessed       Memory: Decreased short-term memory (decrease recall of events)      Awareness: Intellectual   General Comments: good recognition with some carry over of instructions from previous session ( use of soft touch call bell, use of straw)                 Pertinent Vitals/ Pain       Pain Assessment: No/denies pain Pain Score: 0-No pain   Frequency Min 3X/week     Progress Toward Goals  OT Goals(current goals can now befound in the care plan section)  Progress towards OT goals: Progressing toward goals  Acute Rehab OT Goals Patient Stated Goal: to feed myself  Plan Discharge plan remains appropriate  Co-evaluation    PT/OT/SLP Co-Evaluation/Treatment: Yes Reason for Co-Treatment: Complexity of the patient's impairments (multi-system  involvement);For patient/therapist safety   OT goals addressed during session: ADL's and self-care;Strengthening/ROM      End of Session Equipment Utilized During Treatment: Cervical collar   Activity Tolerance Patient tolerated treatment well   Patient Left with call bell/phone within reach;in chair;with nursing/sitter in room   Nurse Communication Mobility status;Need for lift equipment        Time: 1006-1037 OT Time Calculation (min): 31 min  Charges: OT General Charges $OT Visit: 1 Procedure OT Treatments $Self Care/Home Management : 8-22 mins  Loza Prell , MS, OTR/L, CLT Pager: [phone]  06/13/2015, 11:24 AM

## 2015-06-13 NOTE — Progress Notes (Signed)
INFECTIOUS DISEASE PROGRESS NOTE  ID: Phillip [name] is a 49 y.o. male with  Principal Problem:   HAP (hospital-acquired pneumonia) Active Problems:   Tobacco abuse   Cervical spinal cord injury   Spinal cord injury at C5-C7 level without injury of spinal bone   Forehead laceration   Ankle fracture, right   Quadriplegia   Hallucinations   Acute encephalopathy   Seizures   Bacteremia   Normocytic anemia   C. difficile diarrhea  Subjective: Without complaints. No BM today, only flatus.   Abtx:  Anti-infectives    Start     Dose/Rate Route Frequency Ordered Stop   06/12/15 1400  piperacillin-tazobactam (ZOSYN) IVPB 3.375 g     3.375 g 12.5 mL/hr over 240 Minutes Intravenous 3 times per day 06/12/15 1250     06/12/15 1330  vancomycin (VANCOCIN) 1,250 mg in sodium chloride 0.9 % 250 mL IVPB     1,250 mg 166.7 mL/hr over 90 Minutes Intravenous Every 12 hours 06/12/15 1250     06/11/15 0845  vancomycin (VANCOCIN) 50 mg/mL oral solution 125 mg     125 mg Oral 4 times per day 06/11/15 0832     06/10/15 0300  vancomycin (VANCOCIN) 1,250 mg in sodium chloride 0.9 % 250 mL IVPB  Status:  Discontinued     1,250 mg 166.7 mL/hr over 90 Minutes Intravenous Every 12 hours 06/09/15 1737 06/10/15 1228   06/08/15 1500  metroNIDAZOLE (FLAGYL) tablet 500 mg  Status:  Discontinued     500 mg Oral 3 times per day 06/08/15 1423 06/11/15 0832   06/07/15 0430  vancomycin (VANCOCIN) IVPB 1000 mg/200 mL premix  Status:  Discontinued     1,000 mg 200 mL/hr over 60 Minutes Intravenous Every 12 hours 06/07/15 0418 06/09/15 1737   06/07/15 0430  piperacillin-tazobactam (ZOSYN) IVPB 3.375 g  Status:  Discontinued     3.375 g 12.5 mL/hr over 240 Minutes Intravenous 3 times per day 06/07/15 0418 06/08/15 1423      Medications:  Scheduled: . feeding supplement (ENSURE ENLIVE)  237 mL Oral BID BM  . heparin  5,000 Units Subcutaneous 3 times per day  . multivitamin with minerals  1 tablet Oral  Daily  . neomycin-bacitracin-polymyxin  1 application Topical BID  . nicotine  21 mg Transdermal Daily  . piperacillin-tazobactam (ZOSYN)  IV  3.375 g Intravenous 3 times per day  . QUEtiapine  25 mg Oral QHS  . saccharomyces boulardii  250 mg Oral BID  . sodium chloride  3 mL Intravenous Q12H  . vancomycin  1,250 mg Intravenous Q12H  . vancomycin  125 mg Oral 4 times per day  . vitamin B-12  1,000 mcg Oral Daily    Objective: Vital signs in last 24 hours: Temp:  [98.7 F (37.1 C)-100.4 F (38 C)] 98.7 F (37.1 C) (07/04 0930) Pulse Rate:  [86-89] 86 (07/04 0930) Resp:  [16-20] 18 (07/04 0930) BP: (108-127)/(63-74) 121/74 mmHg (07/04 0930) SpO2:  [97 %-100 %] 97 % (07/04 0930)   General appearance: alert, cooperative and no distress Resp: clear to auscultation bilaterally Cardio: regular rate and rhythm GI: normal findings: bowel sounds normal and soft, non-tender  Lab Results  Recent Labs  06/12/15 0733  WBC 7.3  HGB 8.5*  HCT 26.3*  NA 136  K 4.2  CL 102  CO2 26  BUN 10  CREATININE 0.77   Liver Panel No results for input(s): PROT, ALBUMIN, AST, ALT, ALKPHOS, BILITOT, BILIDIR, IBILI  in the last 72 hours. Sedimentation Rate No results for input(s): ESRSEDRATE in the last 72 hours. C-Reactive Protein No results for input(s): CRP in the last 72 hours.  Microbiology: Recent Results (from the past 240 hour(s))  Culture, blood (routine x 2)     Status: None   Collection Time: 06/06/15 11:30 PM  Result Value Ref Range Status   Specimen Description BLOOD RIGHT FOREARM  Final   Special Requests BOTTLES DRAWN AEROBIC AND ANAEROBIC 5CC EA  Final   Culture  Setup Time   Final    GRAM POSITIVE COCCI IN CLUSTERS IN BOTH AEROBIC AND ANAEROBIC BOTTLES CRITICAL RESULT CALLED TO, READ BACK BY AND VERIFIED WITH: S. HOCUTT RN [phone] GREEN R CONFIRMED BY R. HOLMES    Culture   Final    STAPHYLOCOCCUS SPECIES (COAGULASE NEGATIVE) THE SIGNIFICANCE OF ISOLATING THIS  ORGANISM FROM A SINGLE SET OF BLOOD CULTURES WHEN MULTIPLE SETS ARE DRAWN IS UNCERTAIN. PLEASE NOTIFY THE MICROBIOLOGY DEPARTMENT WITHIN ONE WEEK IF SPECIATION AND SENSITIVITIES ARE REQUIRED.    Report Status 06/11/2015 FINAL  Final  Culture, blood (routine x 2)     Status: None   Collection Time: 06/07/15 12:06 AM  Result Value Ref Range Status   Specimen Description BLOOD RIGHT ARM  Final   Special Requests BOTTLES DRAWN AEROBIC AND ANAEROBIC 5CC EA  Final   Culture NO GROWTH 5 DAYS  Final   Report Status 06/12/2015 FINAL  Final  Urine culture     Status: None   Collection Time: 06/07/15 12:37 AM  Result Value Ref Range Status   Specimen Description URINE, CATHETERIZED  Final   Special Requests NONE  Final   Culture 2,000 COLONIES/mL INSIGNIFICANT GROWTH  Final   Report Status 06/08/2015 FINAL  Final  Clostridium Difficile by PCR (not at [facility])     Status: Abnormal   Collection Time: 06/08/15 12:49 PM  Result Value Ref Range Status   C difficile by pcr POSITIVE (A) NEGATIVE Final    Comment: CRITICAL RESULT CALLED TO, READ BACK BY AND VERIFIED WITH: Madelyn Brunner RN 14:10 06/08/15 ([email or web address])   Culture, blood (routine x 2)     Status: None (Preliminary result)   Collection Time: 06/11/15  6:40 PM  Result Value Ref Range Status   Specimen Description BLOOD LEFT ANTECUBITAL  Final   Special Requests BOTTLES DRAWN AEROBIC AND ANAEROBIC 10CC EACH  Final   Culture NO GROWTH < 24 HOURS  Final   Report Status PENDING  Incomplete  Culture, blood (routine x 2)     Status: None (Preliminary result)   Collection Time: 06/11/15  6:47 PM  Result Value Ref Range Status   Specimen Description BLOOD RIGHT HAND  Final   Special Requests BOTTLES DRAWN AEROBIC AND ANAEROBIC 10CC EACH  Final   Culture NO GROWTH < 24 HOURS  Final   Report Status PENDING  Incomplete    Studies/Results: Ct Abdomen Pelvis W Contrast  06/12/2015   CLINICAL DATA:  Fever abdominal pain and C diff  EXAM: CT ABDOMEN AND PELVIS  WITH CONTRAST  TECHNIQUE: Multidetector CT imaging of the abdomen and pelvis was performed using the standard protocol following bolus administration of intravenous contrast.  CONTRAST:  170mL OMNIPAQUE IOHEXOL 300 MG/ML  SOLN  COMPARISON:  06/05/2008  FINDINGS: There is urinary bladder mural thickening. There is mild hydronephrosis and hydroureter bilaterally. No renal parenchymal abnormalities are evident. There are normal appearances of the liver, spleen, pancreas and adrenals. Stomach and small bowel  appear normal. Appendix is normal. There is extensive colonic diverticulosis.  There is right lower lobe lung. Consolidation posteriorly. There is a small right pleural effusion.  IMPRESSION: 1. Urinary bladder mural thickening. This may represent cystitis. There also is mild hydronephrosis and hydroureter bilaterally without evidence of obstructing stone or mass. 2. Consolidation in the right lower lobe lung, possibly pneumonia. Small right pleural effusion.   Electronically Signed   By: Andreas Newport M.D.   On: 06/12/2015 06:30   Dg Chest Port 1 View  06/11/2015   CLINICAL DATA:  Fever and sweating for 1 day.  EXAM: PORTABLE CHEST - 1 VIEW  COMPARISON:  06/06/2015  FINDINGS: Support apparatus projecting over the a lung apices. Midline trachea. Normal heart size and mediastinal contours. No pleural effusion or pneumothorax. Equivocal right infrahilar increased density.  IMPRESSION: Subtle right infrahilar increased density could be due to volume loss and AP portable technique. PA and lateral radiographs would be informative. As the clinical history describes quadriplegia, this may not be possible. Therefore, short term radiographic follow-up in 12 to 24 hours should be considered.   Electronically Signed   By: Abigail Miyamoto M.D.   On: 06/11/2015 20:36     Assessment/Plan: Fever RLL PNA C diff   Total days of antibiotics: 7 7-3 zosyn 7-3 vanco IV 6-28 zosyn 6-29 6-28 vanco IV 7-1 6-29 flagyl  7-2 7-2 vanco po   Tmax last 24h 100.4  continue to watch temps Aim for 8 days vanco/zosyn IV.  Aim for 14 days of PO flagyl.       Bobby Rumpf Infectious Diseases (pager) [phone] [email or web address] 06/13/2015, 1:39 PM  LOS: 6 days

## 2015-06-13 NOTE — Progress Notes (Signed)
Bilateral Prafo boots ordered per MD. [employer] paged and order given

## 2015-06-13 NOTE — Progress Notes (Signed)
Physical Therapy Treatment Patient Details Name: Phillip [name] MRN: [id number] DOB: [birth year] Today's Date: 06/13/2015    History of Present Illness 49 year old African-American male with recent cervical spine injury, status post fusion surgery on June 20, quadriplegia as a result of the spinal injury, presented with altered mental status, fever and diarrhea. Patient was was living in a skilled nursing facility. preliminary blood cultures show gram positive cocci in clusters and he is positive for C. Diff.AMS likely secondary to infection and now improved.    PT Comments    Patient making some progress towards PT goals at this time. Tolerated OOB to chair with assist. Continues to demonstrate some active use of bilateral UEs. Will continue to see and progress as tolerated.  Follow Up Recommendations  SNF;Supervision/Assistance - 24 hour     Equipment Recommendations  None recommended by PT    Recommendations for Other Services Rehab consult     Precautions / Restrictions Precautions Precautions: Cervical;Fall Precaution Comments: C6 quad and hard cervical collar Restrictions Weight Bearing Restrictions: Yes RLE Weight Bearing: Non weight bearing Other Position/Activity Restrictions: Allow PROM to right ankle    Mobility  Bed Mobility Overal bed mobility: Needs Assistance;+2 for physical assistance Bed Mobility: Rolling;Sidelying to Sit;Sit to Sidelying     Supine to sit: Max assist;+2 for physical assistance Sit to supine: Total assist;+2 for physical assistance   General bed mobility comments: Patient able to initiate UE use, and utilize UE to pull to sitting from bed with HOB elevated  Transfers Overall transfer level: Needs assistance   Transfers: Comptroller transfers: Total assist;+2 physical assistance;From elevated surface   General transfer comment: Patient able to perform some stability reactions using UEs and  trunk. Assisted patient via 4 part posterior slide using sheet to form sling  Ambulation/Gait                 Stairs            Wheelchair Mobility    Modified Rankin (Stroke Patients Only)       Balance   Sitting-balance support: Bilateral upper extremity supported Sitting balance-Leahy Scale: Zero Sitting balance - Comments: patient able to initiate weight shifts and UE support but continues to required moderate to maximial assist to maintain balaance in sitting position                            Cognition Arousal/Alertness: Awake/alert Behavior During Therapy: Flat affect Overall Cognitive Status: Within Functional Limits for tasks assessed       Memory: Decreased short-term memory (decreased recall of events)     Awareness: Intellectual   General Comments: good recognition with some carry over of instructions from previous session ( use of soft touch call bell, use of straw)    Exercises      General Comments        Pertinent Vitals/Pain Pain Assessment: No/denies pain Pain Score: 0-No pain    Home Living                      Prior Function            PT Goals (current goals can now be found in the care plan section) Acute Rehab PT Goals Patient Stated Goal: to feed myself PT Goal Formulation: With patient Time For Goal Achievement: 06/23/15 Potential to Achieve Goals: Good Progress towards PT goals: Progressing toward  goals    Frequency  Min 2X/week    PT Plan Current plan remains appropriate    Co-evaluation PT/OT/SLP Co-Evaluation/Treatment: Yes Reason for Co-Treatment: Complexity of the patient's impairments (multi-system involvement);For patient/therapist safety         End of Session Equipment Utilized During Treatment: Cervical collar Activity Tolerance: Patient tolerated treatment well Patient left: in chair;with call bell/phone within reach     Time: 1006-1037 PT Time Calculation (min)  (ACUTE ONLY): 31 min  Charges:  $Therapeutic Activity: 8-22 mins                    G CodesDuncan Dull 06-16-15, 10:54 AM Alben Deeds, PT DPT  [phone]

## 2015-06-13 NOTE — Progress Notes (Signed)
Orthopedic Tech Progress Note Patient Details:  Phillip [name] [birth year] [id number]  Patient ID: [name], male   DOB: Jul 02, 1966, 49 y.o.   MRN: [id number] Called in bio-tech brace order; spoke with Phillip [name], Phillip [name] 06/13/2015, 11:20 AM

## 2015-06-13 NOTE — Progress Notes (Signed)
[facility] PROGRESS NOTE  REYES ALDACO LMB:[id number] DOB: [birth year] DOA: 06/06/2015  Brief HPI: 49 year old African-American male with recent cervical spine injury, status post fusion surgery on June 20, quadriplegia as a result of the spinal injury, presented with altered mental status, fever and diarrhea. Patient was was living in a skilled nursing facility. Apparently, his symptoms had been ongoing for 3 days. He was hospitalized for further management. He was positive for C. difficile. He had positive blood cultures which were contaminants. Patient continued to spike fevers. He was found to have a consolidation in his lung. ID was consulted. He was placed back on vancomycin and Zosyn.  Past medical history:  Past Medical History  Diagnosis Date  . DJD (degenerative joint disease)   . Seizures   . Broken hip   . Cervical spinal cord injury   . Tobacco abuse     Consultants: Neurology, infectious disease  Procedures: None.  Antibiotics: Zosyn 6/28-6/29 Vancomycin 6/28-7/1 Oral Flagyl. 629--7/2 Oral vancomycin 7/2 Placed back on vancomycin and Zosyn. 7/3  Subjective: Patient denies any new complaints. Per RN. Diarrhea appears to have resolved. Patient does mention some cough but no expectoration.    Objective: Vital Signs  Filed Vitals:   06/12/15 2208 06/13/15 0035 06/13/15 0208 06/13/15 0544  BP: 113/71  108/63 111/71  Pulse: 87  88 86  Temp: 100.2 F (37.9 C) 99.9 F (37.7 C) 99.3 F (37.4 C) 100.4 F (38 C)  TempSrc: Oral Oral Oral Oral  Resp: 18  18 18   Height:      Weight:      SpO2: 99%  100% 100%    Intake/Output Summary (Last 24 hours) at 06/13/15 0815 Last data filed at 06/13/15 0554  Gross per 24 hour  Intake    580 ml  Output   3575 ml  Net  -2995 ml   Filed Weights   06/06/15 2253 06/07/15 0400 06/10/15 0512  Weight: 74.844 kg (165 lb) 76.204 kg (168 lb) 77.293 kg (170 lb 6.4 oz)    General appearance: alert, cooperative,  appears stated age and no distress. Neck collar is noted. Resp: clear to auscultation bilaterally. Diminished air entry at the bases Cardio: regular rate and rhythm, S1, S2 normal, no murmur, click, rub or gallop GI: soft, nontender today. bowel sounds normal; no masses,  no organomegaly Neurologic: movement appreciated in the upper extremities. None in the lower extremities. Incision site in the right lower extremity is clean.  Lab Results:  Basic Metabolic Panel:  Recent Labs Lab 06/06/15 2330 06/07/15 0615 06/08/15 0625 06/09/15 0428 06/12/15 0733  NA 142 141 141 139 136  K 3.8 3.4* 3.2* 3.6 4.2  CL 103 108 103 104 102  CO2 27 25 28 28 26   GLUCOSE 100* 98 98 88 106*  BUN 28* 24* 14 10 10   CREATININE 1.37* 1.16 0.93 0.78 0.77  CALCIUM 8.6* 8.1* 8.5* 8.6* 8.8*  MG  --   --  1.6*  --   --    Liver Function Tests:  Recent Labs Lab 06/06/15 2330 06/07/15 0615 06/08/15 0625 06/09/15 0428  AST 100* 73* 42* 39  ALT 98* 85* 67* 64*  ALKPHOS 99 90 75 77  BILITOT 0.6 0.5 0.6 0.6  PROT 6.5 6.1* 5.8* 6.1*  ALBUMIN 2.4* 2.2* 1.9* 2.1*    Recent Labs Lab 06/07/15 1127  AMMONIA 35   CBC:  Recent Labs Lab 06/06/15 2330 06/08/15 0625 06/09/15 0428 06/12/15 0733  WBC 9.9 7.3  7.5 7.3  NEUTROABS 7.0  --   --   --   HGB 8.2* 7.7* 8.6* 8.5*  HCT 24.9* 23.5* 25.6*  24.8* 26.3*  MCV 95.4 94.8 92.4 92.0  PLT 471* 462* 522* 527*   Cardiac Enzymes:  Recent Labs Lab 06/06/15 2330  CKTOTAL 132   CBG:  Recent Labs Lab 06/10/15 0651 06/10/15 1216 06/11/15 0646 06/12/15 0637 06/13/15 0627  GLUCAP 89 91 85 125* 93    Recent Results (from the past 240 hour(s))  Culture, blood (routine x 2)     Status: None   Collection Time: 06/06/15 11:30 PM  Result Value Ref Range Status   Specimen Description BLOOD RIGHT FOREARM  Final   Special Requests BOTTLES DRAWN AEROBIC AND ANAEROBIC 5CC EA  Final   Culture  Setup Time   Final    GRAM POSITIVE COCCI IN CLUSTERS IN  BOTH AEROBIC AND ANAEROBIC BOTTLES CRITICAL RESULT CALLED TO, READ BACK BY AND VERIFIED WITH: S. HOCUTT RN [id number] GREEN R CONFIRMED BY R. HOLMES    Culture   Final    STAPHYLOCOCCUS SPECIES (COAGULASE NEGATIVE) THE SIGNIFICANCE OF ISOLATING THIS ORGANISM FROM A SINGLE SET OF BLOOD CULTURES WHEN MULTIPLE SETS ARE DRAWN IS UNCERTAIN. PLEASE NOTIFY THE MICROBIOLOGY DEPARTMENT WITHIN ONE WEEK IF SPECIATION AND SENSITIVITIES ARE REQUIRED.    Report Status 06/11/2015 FINAL  Final  Culture, blood (routine x 2)     Status: None   Collection Time: 06/07/15 12:06 AM  Result Value Ref Range Status   Specimen Description BLOOD RIGHT ARM  Final   Special Requests BOTTLES DRAWN AEROBIC AND ANAEROBIC 5CC EA  Final   Culture NO GROWTH 5 DAYS  Final   Report Status 06/12/2015 FINAL  Final  Urine culture     Status: None   Collection Time: 06/07/15 12:37 AM  Result Value Ref Range Status   Specimen Description URINE, CATHETERIZED  Final   Special Requests NONE  Final   Culture 2,000 COLONIES/mL INSIGNIFICANT GROWTH  Final   Report Status 06/08/2015 FINAL  Final  Clostridium Difficile by PCR (not at [facility])     Status: Abnormal   Collection Time: 06/08/15 12:49 PM  Result Value Ref Range Status   C difficile by pcr POSITIVE (A) NEGATIVE Final    Comment: CRITICAL RESULT CALLED TO, READ BACK BY AND VERIFIED WITH: Madelyn Brunner RN 14:10 06/08/15 ([email or web address])   Culture, blood (routine x 2)     Status: None (Preliminary result)   Collection Time: 06/11/15  6:40 PM  Result Value Ref Range Status   Specimen Description BLOOD LEFT ANTECUBITAL  Final   Special Requests BOTTLES DRAWN AEROBIC AND ANAEROBIC 10CC EACH  Final   Culture NO GROWTH < 24 HOURS  Final   Report Status PENDING  Incomplete  Culture, blood (routine x 2)     Status: None (Preliminary result)   Collection Time: 06/11/15  6:47 PM  Result Value Ref Range Status   Specimen Description BLOOD RIGHT HAND  Final   Special Requests BOTTLES DRAWN  AEROBIC AND ANAEROBIC 10CC EACH  Final   Culture NO GROWTH < 24 HOURS  Final   Report Status PENDING  Incomplete      Studies/Results: Ct Abdomen Pelvis W Contrast  06/12/2015   CLINICAL DATA:  Fever abdominal pain and C diff  EXAM: CT ABDOMEN AND PELVIS WITH CONTRAST  TECHNIQUE: Multidetector CT imaging of the abdomen and pelvis was performed using the standard protocol following bolus administration of intravenous  contrast.  CONTRAST:  156mL OMNIPAQUE IOHEXOL 300 MG/ML  SOLN  COMPARISON:  06/05/2008  FINDINGS: There is urinary bladder mural thickening. There is mild hydronephrosis and hydroureter bilaterally. No renal parenchymal abnormalities are evident. There are normal appearances of the liver, spleen, pancreas and adrenals. Stomach and small bowel appear normal. Appendix is normal. There is extensive colonic diverticulosis.  There is right lower lobe lung. Consolidation posteriorly. There is a small right pleural effusion.  IMPRESSION: 1. Urinary bladder mural thickening. This may represent cystitis. There also is mild hydronephrosis and hydroureter bilaterally without evidence of obstructing stone or mass. 2. Consolidation in the right lower lobe lung, possibly pneumonia. Small right pleural effusion.   Electronically Signed   By: Andreas Newport M.D.   On: 06/12/2015 06:30   Dg Chest Port 1 View  06/11/2015   CLINICAL DATA:  Fever and sweating for 1 day.  EXAM: PORTABLE CHEST - 1 VIEW  COMPARISON:  06/06/2015  FINDINGS: Support apparatus projecting over the a lung apices. Midline trachea. Normal heart size and mediastinal contours. No pleural effusion or pneumothorax. Equivocal right infrahilar increased density.  IMPRESSION: Subtle right infrahilar increased density could be due to volume loss and AP portable technique. PA and lateral radiographs would be informative. As the clinical history describes quadriplegia, this may not be possible. Therefore, short term radiographic follow-up in 12 to  24 hours should be considered.   Electronically Signed   By: Abigail Miyamoto M.D.   On: 06/11/2015 20:36    Medications:  Scheduled: . feeding supplement (ENSURE ENLIVE)  237 mL Oral BID BM  . heparin  5,000 Units Subcutaneous 3 times per day  . multivitamin with minerals  1 tablet Oral Daily  . neomycin-bacitracin-polymyxin  1 application Topical BID  . nicotine  21 mg Transdermal Daily  . piperacillin-tazobactam (ZOSYN)  IV  3.375 g Intravenous 3 times per day  . QUEtiapine  25 mg Oral QHS  . saccharomyces boulardii  250 mg Oral BID  . sodium chloride  3 mL Intravenous Q12H  . vancomycin  1,250 mg Intravenous Q12H  . vancomycin  125 mg Oral 4 times per day  . vitamin B-12  1,000 mcg Oral Daily   Continuous: . 0.9 % NaCl with KCl 20 mEq / L 30 mL/hr at 06/12/15 1251   BSW:[id number], oxyCODONE-acetaminophen  Assessment/Plan:  Principal Problem:   Acute encephalopathy Active Problems:   Tobacco abuse   Cervical spinal cord injury   Spinal cord injury at C5-C7 level without injury of spinal bone   Forehead laceration   Ankle fracture, right   Quadriplegia   Hallucinations   Seizures   Bacteremia   Normocytic anemia   C. difficile diarrhea     Fever, likely secondary to pneumonia, which is probably hospital-acquired Appreciate ID input. Patient started on vancomycin and Zosyn yesterday. Fever could be subsiding. CT scan of the abdomen, pelvis did not show any acute intra-abdominal finding. Consolidation was noted in the right lung. Diarrhea is improving. Repeat blood cultures are pending. Initial blood cultures did reveal coagulase-negative staph, which is thought to be a contaminant. Lactic acid was normal. MRI of the cervical spine shows no evidence for infection. Procalcitonin 0.33. Initial Lactic acid was 0.64.   C. difficile diarrhea Diarrhea seems to be improving. Since he was continuing to spike fever, oral Flagyl was changed over to oral vancomycin. Continue for  now. Enteric precautions. Probiotics.  Acute encephalopathy He has improved and is now back to baseline. This  was likely secondary to his acute infection. EEG negative for seizure predisposition, mild slowing. Ammonia--35. B12 219, TSH 1.64, HIV nonreactive. Folic acid level is normal. Continue supplementation with vitamin B-12. CT brain negative for acute intracranial abnormalities. Seroquel was discontinued.   History of seizure disorder  Patient is not on any AEDs. CPK 132.  Hx of C spinal cord injury and s/p of C-spine fusion surgery:  Maintain cervical collar. No evidence for infection on MRI.  Mild AKI/hypokalemia/hypomagnesemia Likely due to volume depletion. Improved with IV fluids. Replace potassium and magnesium.  Normocytic anemia Drop in hemoglobin was noted initially. Now hemoglobin is stable. No overt bleeding identified. Continue to monitor. Continue vitamin B-12 supplementation.  Suprapubic mass secondary to distended bladder Resolved after Foley catheter placement. We will need to do voiding trial once he is better from his acute infection. There is a concern that he could have neurogenic bladder.  Right ankle fracture  Status post ORIF 05/27/2015. Surgical site looks clean without any drainage or erythema. Has been seen by orthopedics as well.  Lower extremity edema and pain  No DVT noted on Doppler study.  Hx of Tobacco abuse no tobacco since last hospitalization  DVT Prophylaxis: Subcutaneous heparin    Code Status: Full code  Family Communication: Discussed with patient. Discussed with his sister 7/3 Disposition Plan: Will return to skilled nursing facility when ready for discharge.     LOS: 6 days   [facility] Pager [phone] 06/13/2015, 8:15 AM  If 7PM-7AM, please contact night-coverage at [email or web address], password [facility]

## 2015-06-13 NOTE — Clinical Social Work Note (Signed)
CSW reviewed chart. Once pt is medically stable he will transition to [employer]. CSW will continue to follow and provide support.   Stanley, MSW, [place]

## 2015-06-14 DIAGNOSIS — J90 Pleural effusion, not elsewhere classified: Secondary | ICD-10-CM

## 2015-06-14 LAB — BASIC METABOLIC PANEL
Anion gap: 10 (ref 5–15)
BUN: 9 mg/dL (ref 6–20)
CALCIUM: 9 mg/dL (ref 8.9–10.3)
CO2: 25 mmol/L (ref 22–32)
CREATININE: 0.84 mg/dL (ref 0.61–1.24)
Chloride: 101 mmol/L (ref 101–111)
Glucose, Bld: 111 mg/dL — ABNORMAL HIGH (ref 65–99)
Potassium: 4.3 mmol/L (ref 3.5–5.1)
Sodium: 136 mmol/L (ref 135–145)

## 2015-06-14 LAB — GLUCOSE, CAPILLARY: Glucose-Capillary: 90 mg/dL (ref 65–99)

## 2015-06-14 LAB — CBC
HCT: 28.6 % — ABNORMAL LOW (ref 39.0–52.0)
Hemoglobin: 9.3 g/dL — ABNORMAL LOW (ref 13.0–17.0)
MCH: 30.4 pg (ref 26.0–34.0)
MCHC: 32.5 g/dL (ref 30.0–36.0)
MCV: 93.5 fL (ref 78.0–100.0)
Platelets: 496 10*3/uL — ABNORMAL HIGH (ref 150–400)
RBC: 3.06 MIL/uL — ABNORMAL LOW (ref 4.22–5.81)
RDW: 12.4 % (ref 11.5–15.5)
WBC: 7.5 10*3/uL (ref 4.0–10.5)

## 2015-06-14 MED ORDER — WHITE PETROLATUM GEL
Status: AC
  Administered 2015-06-14: 0.2
  Filled 2015-06-14: qty 1

## 2015-06-14 NOTE — Progress Notes (Signed)
[facility] PROGRESS NOTE  Phillip [name] XBM:[id number] DOB: 1966-07-18 DOA: 06/06/2015  Brief HPI: 49 year old African-American male with recent cervical spine injury, status post fusion surgery on June 20, quadriplegia as a result of the spinal injury, presented with altered mental status, fever and diarrhea. Patient was was living in a skilled nursing facility. Apparently, his symptoms had been ongoing for 3 days. He was hospitalized for further management. He was positive for C. difficile. He had positive blood cultures which were contaminants. Patient continued to spike fevers. He was found to have a consolidation in his lung. ID was consulted. He was placed back on vancomycin and Zosyn.  Past medical history:  Past Medical History  Diagnosis Date  . DJD (degenerative joint disease)   . Seizures   . Broken hip   . Cervical spinal cord injury   . Tobacco abuse     Consultants: Neurology, infectious disease  Procedures: None.  Antibiotics: Zosyn 6/28-6/29 Vancomycin 6/28-7/1 Oral Flagyl. 629--7/2 Oral vancomycin 7/2 Placed back on vancomycin and Zosyn 7/3  Subjective: Patient denies any complaints. No worsening in his diarrhea. No cough.  Objective: Vital Signs  Filed Vitals:   06/13/15 1730 06/13/15 2132 06/14/15 0104 06/14/15 0508  BP: 130/75 114/62 116/61 105/59  Pulse: 100 92 96 93  Temp: 100.2 F (37.9 C) 99.2 F (37.3 C) 100 F (37.8 C) 98.8 F (37.1 C)  TempSrc: Oral Oral Oral Oral  Resp: 16 17 18 17   Height:      Weight:      SpO2: 100% 100% 99% 99%    Intake/Output Summary (Last 24 hours) at 06/14/15 0750 Last data filed at 06/13/15 1502  Gross per 24 hour  Intake    360 ml  Output    750 ml  Net   -390 ml   Filed Weights   06/06/15 2253 06/07/15 0400 06/10/15 0512  Weight: 74.844 kg (165 lb) 76.204 kg (168 lb) 77.293 kg (170 lb 6.4 oz)    General appearance: alert, cooperative, appears stated age and no distress. Neck collar is  noted. Resp: clear to auscultation bilaterally. Diminished air entry at the bases. No definite crackles. Cardio: regular rate and rhythm, S1, S2 normal, no murmur, click, rub or gallop GI: soft, nontender today. bowel sounds normal; no masses,  no organomegaly Neurologic: movement appreciated in the upper extremities. None in the lower extremities. Incision site in the right lower extremity is clean.  Lab Results:  Basic Metabolic Panel:  Recent Labs Lab 06/08/15 0625 06/09/15 0428 06/12/15 0733 06/14/15 0615  NA 141 139 136 136  K 3.2* 3.6 4.2 4.3  CL 103 104 102 101  CO2 28 28 26 25   GLUCOSE 98 88 106* 111*  BUN 14 10 10 9   CREATININE 0.93 0.78 0.77 0.84  CALCIUM 8.5* 8.6* 8.8* 9.0  MG 1.6*  --   --   --    Liver Function Tests:  Recent Labs Lab 06/08/15 0625 06/09/15 0428  AST 42* 39  ALT 67* 64*  ALKPHOS 75 77  BILITOT 0.6 0.6  PROT 5.8* 6.1*  ALBUMIN 1.9* 2.1*    Recent Labs Lab 06/07/15 1127  AMMONIA 35   CBC:  Recent Labs Lab 06/08/15 0625 06/09/15 0428 06/12/15 0733 06/14/15 0615  WBC 7.3 7.5 7.3 7.5  HGB 7.7* 8.6* 8.5* 9.3*  HCT 23.5* 25.6*  24.8* 26.3* 28.6*  MCV 94.8 92.4 92.0 93.5  PLT 462* 522* 527* 496*   CBG:  Recent Labs Lab  06/10/15 1216 06/11/15 0646 06/12/15 0637 06/13/15 0627 06/14/15 0646  GLUCAP 91 85 125* 93 90    Recent Results (from the past 240 hour(s))  Culture, blood (routine x 2)     Status: None   Collection Time: 06/06/15 11:30 PM  Result Value Ref Range Status   Specimen Description BLOOD RIGHT FOREARM  Final   Special Requests BOTTLES DRAWN AEROBIC AND ANAEROBIC 5CC EA  Final   Culture  Setup Time   Final    GRAM POSITIVE COCCI IN CLUSTERS IN BOTH AEROBIC AND ANAEROBIC BOTTLES CRITICAL RESULT CALLED TO, READ BACK BY AND VERIFIED WITH: S. HOCUTT RN [id number] GREEN R CONFIRMED BY R. HOLMES    Culture   Final    STAPHYLOCOCCUS SPECIES (COAGULASE NEGATIVE) THE SIGNIFICANCE OF ISOLATING THIS ORGANISM  FROM A SINGLE SET OF BLOOD CULTURES WHEN MULTIPLE SETS ARE DRAWN IS UNCERTAIN. PLEASE NOTIFY THE MICROBIOLOGY DEPARTMENT WITHIN ONE WEEK IF SPECIATION AND SENSITIVITIES ARE REQUIRED.    Report Status 06/11/2015 FINAL  Final  Culture, blood (routine x 2)     Status: None   Collection Time: 06/07/15 12:06 AM  Result Value Ref Range Status   Specimen Description BLOOD RIGHT ARM  Final   Special Requests BOTTLES DRAWN AEROBIC AND ANAEROBIC 5CC EA  Final   Culture NO GROWTH 5 DAYS  Final   Report Status 06/12/2015 FINAL  Final  Urine culture     Status: None   Collection Time: 06/07/15 12:37 AM  Result Value Ref Range Status   Specimen Description URINE, CATHETERIZED  Final   Special Requests NONE  Final   Culture 2,000 COLONIES/mL INSIGNIFICANT GROWTH  Final   Report Status 06/08/2015 FINAL  Final  Clostridium Difficile by PCR (not at [facility])     Status: Abnormal   Collection Time: 06/08/15 12:49 PM  Result Value Ref Range Status   C difficile by pcr POSITIVE (A) NEGATIVE Final    Comment: CRITICAL RESULT CALLED TO, READ BACK BY AND VERIFIED WITH: Madelyn Brunner RN 14:10 06/08/15 ([email or web address])   Culture, blood (routine x 2)     Status: None (Preliminary result)   Collection Time: 06/11/15  6:40 PM  Result Value Ref Range Status   Specimen Description BLOOD LEFT ANTECUBITAL  Final   Special Requests BOTTLES DRAWN AEROBIC AND ANAEROBIC 10CC EACH  Final   Culture NO GROWTH 2 DAYS  Final   Report Status PENDING  Incomplete  Culture, blood (routine x 2)     Status: None (Preliminary result)   Collection Time: 06/11/15  6:47 PM  Result Value Ref Range Status   Specimen Description BLOOD RIGHT HAND  Final   Special Requests BOTTLES DRAWN AEROBIC AND ANAEROBIC 10CC EACH  Final   Culture NO GROWTH 2 DAYS  Final   Report Status PENDING  Incomplete      Studies/Results: No results found.  Medications:  Scheduled: . feeding supplement (ENSURE ENLIVE)  237 mL Oral BID BM  . heparin  5,000 Units  Subcutaneous 3 times per day  . multivitamin with minerals  1 tablet Oral Daily  . neomycin-bacitracin-polymyxin  1 application Topical BID  . nicotine  21 mg Transdermal Daily  . piperacillin-tazobactam (ZOSYN)  IV  3.375 g Intravenous 3 times per day  . QUEtiapine  25 mg Oral QHS  . saccharomyces boulardii  250 mg Oral BID  . sodium chloride  3 mL Intravenous Q12H  . vancomycin  1,250 mg Intravenous Q12H  . vancomycin  125 mg  Oral 4 times per day  . vitamin B-12  1,000 mcg Oral Daily   Continuous: . 0.9 % NaCl with KCl 20 mEq / L 30 mL/hr at 06/12/15 1251   SNK:[id number], oxyCODONE-acetaminophen  Assessment/Plan:  Principal Problem:   HAP (hospital-acquired pneumonia) Active Problems:   Tobacco abuse   Cervical spinal cord injury   Spinal cord injury at C5-C7 level without injury of spinal bone   Forehead laceration   Ankle fracture, right   Quadriplegia   Hallucinations   Acute encephalopathy   Seizures   Bacteremia   Normocytic anemia   C. difficile diarrhea     Fever, likely secondary to pneumonia, which is probably hospital-acquired ID is following. Patient was placed back on vancomycin and Zosyn, which is being continued. Repeat blood cultures with no growth so far. Fever appears to be subsiding though he continues to have low-grade fever. CT scan of the abdomen, pelvis did not show any acute intra-abdominal finding. Consolidation was noted in the right lung. Diarrhea is improving. Initial blood cultures did reveal coagulase-negative staph, which is thought to be a contaminant. Lactic acid was normal. MRI of the cervical spine shows no evidence for infection. Procalcitonin 0.33. Initial Lactic acid was 0.64.   C. difficile diarrhea Diarrhea seems to be improving. Since he was continuing to spike fever, oral Flagyl was changed over to oral vancomycin. Continue for now. Enteric precautions. Probiotics.  Acute encephalopathy He has improved and is now back to  baseline. This was likely secondary to his acute infection. EEG negative for seizure predisposition, mild slowing. Ammonia--35. B12 219, TSH 1.64, HIV nonreactive. Folic acid level is normal. Continue supplementation with vitamin B-12. CT brain negative for acute intracranial abnormalities. Seroquel was discontinued.   History of seizure disorder  Patient is not on any AEDs. CPK 132.  Hx of C spinal cord injury and s/p of C-spine fusion surgery:  Maintain cervical collar. No evidence for infection on MRI.  Mild AKI/hypokalemia/hypomagnesemia Likely due to volume depletion. Improved with IV fluids. Replace potassium and magnesium.  Normocytic anemia Drop in hemoglobin was noted initially. Now hemoglobin is stable. No overt bleeding identified. Continue to monitor. Continue vitamin B-12 supplementation.  Suprapubic mass secondary to distended bladder Resolved after Foley catheter placement. Will need to do voiding trial once he is better from his acute infection. There is a concern that he could have neurogenic bladder.   Right ankle fracture  Status post ORIF 05/27/2015. Surgical site looks clean without any drainage or erythema. Has been seen by orthopedics as well.  Lower extremity edema and pain  No DVT noted on Doppler study.  Hx of Tobacco abuse no tobacco since last hospitalization  DVT Prophylaxis: Subcutaneous heparin    Code Status: Full code  Family Communication: Discussed with patient. Discussed with his sister 7/3 Disposition Plan: Will return to skilled nursing facility when ready for discharge. Wait for ID input regarding antibiotics.    LOS: 7 days   [facility] Pager [phone] 06/14/2015, 7:50 AM  If 7PM-7AM, please contact night-coverage at [email or web address], password [facility]

## 2015-06-14 NOTE — Progress Notes (Signed)
As stated by Karene Fry on 7/4, pt lacks the 24/7 care needed for a CIR admit. [employer] will only pay for one rehab venue of care. Therefore, SNF rehab recommended. Please call me for questions [phone]
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
## 2015-06-14 NOTE — Clinical Social Work Note (Signed)
Patient has a bed at [facility]. CSW will continue to follow pt and pt's family for continued support and to facilitate pt's discharge needs once medically stable.  CSW remains available as needed.   Glendon Axe, MSW, LCSWA [phone] 06/14/2015 10:28 AM
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
## 2015-06-15 DIAGNOSIS — D649 Anemia, unspecified: Secondary | ICD-10-CM

## 2015-06-15 DIAGNOSIS — S14109S Unspecified injury at unspecified level of cervical spinal cord, sequela: Secondary | ICD-10-CM

## 2015-06-15 DIAGNOSIS — G8250 Quadriplegia, unspecified: Secondary | ICD-10-CM

## 2015-06-15 DIAGNOSIS — J189 Pneumonia, unspecified organism: Secondary | ICD-10-CM

## 2015-06-15 DIAGNOSIS — S82891A Other fracture of right lower leg, initial encounter for closed fracture: Secondary | ICD-10-CM

## 2015-06-15 DIAGNOSIS — A047 Enterocolitis due to Clostridium difficile: Principal | ICD-10-CM

## 2015-06-15 DIAGNOSIS — R7881 Bacteremia: Secondary | ICD-10-CM

## 2015-06-15 DIAGNOSIS — G9340 Encephalopathy, unspecified: Secondary | ICD-10-CM

## 2015-06-15 DIAGNOSIS — S14109D Unspecified injury at unspecified level of cervical spinal cord, subsequent encounter: Secondary | ICD-10-CM

## 2015-06-15 LAB — GLUCOSE, CAPILLARY
GLUCOSE-CAPILLARY: 90 mg/dL (ref 65–99)
Glucose-Capillary: 91 mg/dL (ref 65–99)

## 2015-06-15 NOTE — Progress Notes (Signed)
Patient ID: Phillip [name], male   DOB: 1966/02/16, 49 y.o.   MRN: [id number]         [facility]    Date of Admission:  06/06/2015           Day 8 C. difficile therapy        Day 4 latest round of vancomycin and piperacillin tazobactam  Principal Problem:   HAP (hospital-acquired pneumonia) Active Problems:   Tobacco abuse   Cervical spinal cord injury   Spinal cord injury at C5-C7 level without injury of spinal bone   Forehead laceration   Ankle fracture, right   Quadriplegia   Hallucinations   Acute encephalopathy   Seizures   Bacteremia   Normocytic anemia   C. difficile diarrhea   . feeding supplement (ENSURE ENLIVE)  237 mL Oral BID BM  . heparin  5,000 Units Subcutaneous 3 times per day  . multivitamin with minerals  1 tablet Oral Daily  . neomycin-bacitracin-polymyxin  1 application Topical BID  . nicotine  21 mg Transdermal Daily  . piperacillin-tazobactam (ZOSYN)  IV  3.375 g Intravenous 3 times per day  . QUEtiapine  25 mg Oral QHS  . saccharomyces boulardii  250 mg Oral BID  . sodium chloride  3 mL Intravenous Q12H  . vancomycin  1,250 mg Intravenous Q12H  . vancomycin  125 mg Oral 4 times per day  . vitamin B-12  1,000 mcg Oral Daily    Subjective: He is feeling better. His stools are starting to be more formed. He denies any new cough, sputum production or shortness of breath.  Review of Systems: Pertinent items are noted in HPI.  Past Medical History  Diagnosis Date  . DJD (degenerative joint disease)   . Seizures   . Broken hip   . Cervical spinal cord injury   . Tobacco abuse     History  Substance Use Topics  . Smoking status: Current Every Day Smoker -- 0.50 packs/day for .5 years    Types: Cigarettes  . Smokeless tobacco: Not on file  . Alcohol Use: 1.2 oz/week    14 Cans of beer per week     Comment: Pt has not drank since inj 2 weeks ago    Family History  Problem Relation Age of Onset  .  Hypertension Mother    No Known Allergies  OBJECTIVE: Blood pressure 107/58, pulse 101, temperature 102.9 F (39.4 C), temperature source Oral, resp. rate 20, height 5\' 9"  (1.753 m), weight 170 lb 6.4 oz (77.293 kg), SpO2 100 %. General: He is comfortable sitting up in a chair. He has a hard cervical collar in place. Skin: No rash. IV site looks good. Lungs: Shallow respirations. Lungs are clear throughout. Cor: Regular S1 and S2 with no murmurs Abdomen: Soft and quiet  Lab Results Lab Results  Component Value Date   WBC 7.5 06/14/2015   HGB 9.3* 06/14/2015   HCT 28.6* 06/14/2015   MCV 93.5 06/14/2015   PLT 496* 06/14/2015    Lab Results  Component Value Date   CREATININE 0.84 06/14/2015   BUN 9 06/14/2015   NA 136 06/14/2015   K 4.3 06/14/2015   CL 101 06/14/2015   CO2 25 06/14/2015    Lab Results  Component Value Date   ALT 64* 06/09/2015   AST 39 06/09/2015   ALKPHOS 77 06/09/2015   BILITOT 0.6 06/09/2015     Microbiology: Recent Results (from the past 240 hour(s))  Culture, blood (routine x 2)     Status: None   Collection Time: 06/06/15 11:30 PM  Result Value Ref Range Status   Specimen Description BLOOD RIGHT FOREARM  Final   Special Requests BOTTLES DRAWN AEROBIC AND ANAEROBIC 5CC EA  Final   Culture  Setup Time   Final    GRAM POSITIVE COCCI IN CLUSTERS IN BOTH AEROBIC AND ANAEROBIC BOTTLES CRITICAL RESULT CALLED TO, READ BACK BY AND VERIFIED WITH: S. HOCUTT RN [id number] GREEN R CONFIRMED BY R. HOLMES    Culture   Final    STAPHYLOCOCCUS SPECIES (COAGULASE NEGATIVE) THE SIGNIFICANCE OF ISOLATING THIS ORGANISM FROM A SINGLE SET OF BLOOD CULTURES WHEN MULTIPLE SETS ARE DRAWN IS UNCERTAIN. PLEASE NOTIFY THE MICROBIOLOGY DEPARTMENT WITHIN ONE WEEK IF SPECIATION AND SENSITIVITIES ARE REQUIRED.    Report Status 06/11/2015 FINAL  Final  Culture, blood (routine x 2)     Status: None   Collection Time: 06/07/15 12:06 AM  Result Value Ref Range Status    Specimen Description BLOOD RIGHT ARM  Final   Special Requests BOTTLES DRAWN AEROBIC AND ANAEROBIC 5CC EA  Final   Culture NO GROWTH 5 DAYS  Final   Report Status 06/12/2015 FINAL  Final  Urine culture     Status: None   Collection Time: 06/07/15 12:37 AM  Result Value Ref Range Status   Specimen Description URINE, CATHETERIZED  Final   Special Requests NONE  Final   Culture 2,000 COLONIES/mL INSIGNIFICANT GROWTH  Final   Report Status 06/08/2015 FINAL  Final  Clostridium Difficile by PCR (not at [facility])     Status: Abnormal   Collection Time: 06/08/15 12:49 PM  Result Value Ref Range Status   C difficile by pcr POSITIVE (A) NEGATIVE Final    Comment: CRITICAL RESULT CALLED TO, READ BACK BY AND VERIFIED WITH: Madelyn Brunner RN 14:10 06/08/15 ([email or web address])   Culture, blood (routine x 2)     Status: None (Preliminary result)   Collection Time: 06/11/15  6:40 PM  Result Value Ref Range Status   Specimen Description BLOOD LEFT ANTECUBITAL  Final   Special Requests BOTTLES DRAWN AEROBIC AND ANAEROBIC 10CC EACH  Final   Culture NO GROWTH 4 DAYS  Final   Report Status PENDING  Incomplete  Culture, blood (routine x 2)     Status: None (Preliminary result)   Collection Time: 06/11/15  6:47 PM  Result Value Ref Range Status   Specimen Description BLOOD RIGHT HAND  Final   Special Requests BOTTLES DRAWN AEROBIC AND ANAEROBIC 10CC EACH  Final   Culture NO GROWTH 4 DAYS  Final   Report Status PENDING  Incomplete    Assessment: He is improving clinically but has had another fever spike this afternoon. I will continue all current antibiotics therapy and reevaluate in the morning. I'm still not convinced that his fevers are due to the small area of possible pneumonia in the right lung base.  Plan: 1. Continue oral vancomycin. He will need 2 weeks after stopping other systemic antibiotics 2. Continue IV vancomycin and piperacillin tazobactam for now  Michel Bickers, MD [facility] [place] [phone] pager   [phone] cell 06/15/2015, 4:13 PM

## 2015-06-15 NOTE — Progress Notes (Signed)
UR completed 

## 2015-06-15 NOTE — Progress Notes (Signed)
[facility] PROGRESS NOTE  Phillip [name] NFA:[id number] DOB: [birth year] DOA: 06/06/2015  Brief HPI: 49 year old African-American male with recent cervical spine injury, status post fusion surgery on June 20, quadriplegia as a result of the spinal injury, presented with altered mental status, fever and diarrhea. Patient was was living in a skilled nursing facility. Apparently, his symptoms had been ongoing for 3 days. He was hospitalized for further management. He was positive for C. difficile. He had positive blood cultures which were contaminants. Patient continued to spike fevers. He was found to have a consolidation in his lung. ID was consulted. He was placed back on vancomycin and Zosyn.  Subjective: Had low-grade fever overnight of 100.8. Denies any cough or any other complaints.  Assessment/Plan:  Principal Problem:   HAP (hospital-acquired pneumonia) Active Problems:   Tobacco abuse   Cervical spinal cord injury   Spinal cord injury at C5-C7 level without injury of spinal bone   Forehead laceration   Ankle fracture, right   Quadriplegia   Hallucinations   Acute encephalopathy   Seizures   Bacteremia   Normocytic anemia   C. difficile diarrhea   Fever, likely secondary to pneumonia, which is probably hospital-acquired ID is following. Patient was placed back on vancomycin and Zosyn, which is being continued.  Repeat blood cultures with no growth so far.  Fever appears to be subsiding, buth he continues to have low-grade fever.  CT scan of the abdomen, pelvis did not show any acute intra-abdominal finding. Consolidation was noted in the right lung. Diarrhea is improving. Initial blood cultures did reveal coagulase-negative staph, which is thought to be a contaminant.  MRI of the cervical spine shows no evidence for infection. Procalcitonin 0.33. Initial Lactic acid was 0.64.  Plan per ID discontinue broad-spectrum IV antibiotics and observe.  C. difficile  diarrhea Diarrhea seems to be improving. Since he was continuing to spike fever, oral Flagyl was changed over to oral vancomycin. Continue for now. Enteric precautions. Probiotics.  Acute encephalopathy This is resolved and is now back to baseline. This was likely secondary to his acute infection. EEG negative for seizure predisposition, mild slowing. Ammonia--35. B12 219, TSH 1.64, HIV nonreactive. Folic acid level is normal. Continue supplementation with vitamin B-12. CT brain negative for acute intracranial abnormalities. Seroquel was discontinued.   History of seizure disorder  Patient is not on any AEDs. CPK 132.  Hx of C spinal cord injury and s/p of C-spine fusion surgery:  Maintain cervical collar. No evidence for infection on MRI.  Mild AKI/hypokalemia/hypomagnesemia Likely due to volume depletion. Improved with IV fluids. Replace potassium and magnesium.  Normocytic anemia Drop in hemoglobin was noted initially. Now hemoglobin is stable. No overt bleeding identified. Continue to monitor. Continue vitamin B-12 supplementation.  Suprapubic mass secondary to distended bladder Resolved after Foley catheter placement. Will need to do voiding trial once he is better from his acute infection.  There is a concern that he could have neurogenic bladder.   Right ankle fracture  Status post ORIF 05/27/2015. Surgical site looks clean without any drainage or erythema. Has been seen by orthopedics as well.  Lower extremity edema and pain  No DVT noted on Doppler study.  Hx of Tobacco abuse no tobacco since last hospitalization   DVT Prophylaxis: Subcutaneous heparin    Code Status: Full code  Family Communication: Discussed with patient.  Disposition Plan: Will return to skilled nursing facility when ready for discharge. Wait for ID input regarding antibiotics.  Consultants: Neurology,  infectious disease  Procedures: None.  Antibiotics: Zosyn 6/28-6/29 Vancomycin  6/28-7/1 Oral Flagyl. 629--7/2 Oral vancomycin 7/2 Placed back on vancomycin and Zosyn 7/3   Objective: Vital Signs  Filed Vitals:   06/14/15 2116 06/15/15 0142 06/15/15 0551 06/15/15 0908  BP: 110/56 102/56 107/57 112/60  Pulse: 88 98 98 99  Temp: 99.3 F (37.4 C) 100.8 F (38.2 C) 99.9 F (37.7 C) 100.3 F (37.9 C)  TempSrc: Oral Oral Oral Oral  Resp: 16 18 19 20   Height:      Weight:      SpO2: 97% 99% 100% 100%    Intake/Output Summary (Last 24 hours) at 06/15/15 1136 Last data filed at 06/15/15 0557  Gross per 24 hour  Intake      0 ml  Output   2601 ml  Net  -2601 ml   Filed Weights   06/06/15 2253 06/07/15 0400 06/10/15 0512  Weight: 74.844 kg (165 lb) 76.204 kg (168 lb) 77.293 kg (170 lb 6.4 oz)    General appearance: alert, cooperative, appears stated age and no distress. Neck collar is noted. Resp: clear to auscultation bilaterally. Diminished air entry at the bases. No definite crackles. Cardio: regular rate and rhythm, S1, S2 normal, no murmur, click, rub or gallop GI: soft, nontender today. bowel sounds normal; no masses,  no organomegaly Neurologic: movement appreciated in the upper extremities. None in the lower extremities. Incision site in the right lower extremity is clean.  Lab Results:  Basic Metabolic Panel:  Recent Labs Lab 06/09/15 0428 06/12/15 0733 06/14/15 0615  NA 139 136 136  K 3.6 4.2 4.3  CL 104 102 101  CO2 28 26 25   GLUCOSE 88 106* 111*  BUN 10 10 9   CREATININE 0.78 0.77 0.84  CALCIUM 8.6* 8.8* 9.0   Liver Function Tests:  Recent Labs Lab 06/09/15 0428  AST 39  ALT 64*  ALKPHOS 77  BILITOT 0.6  PROT 6.1*  ALBUMIN 2.1*   No results for input(s): AMMONIA in the last 168 hours. CBC:  Recent Labs Lab 06/09/15 0428 06/12/15 0733 06/14/15 0615  WBC 7.5 7.3 7.5  HGB 8.6* 8.5* 9.3*  HCT 25.6*  24.8* 26.3* 28.6*  MCV 92.4 92.0 93.5  PLT 522* 527* 496*   CBG:  Recent Labs Lab 06/12/15 0637  06/13/15 0627 06/14/15 0646 06/15/15 0554 06/15/15 0806  GLUCAP 125* 93 90 91 90    Recent Results (from the past 240 hour(s))  Culture, blood (routine x 2)     Status: None   Collection Time: 06/06/15 11:30 PM  Result Value Ref Range Status   Specimen Description BLOOD RIGHT FOREARM  Final   Special Requests BOTTLES DRAWN AEROBIC AND ANAEROBIC 5CC EA  Final   Culture  Setup Time   Final    GRAM POSITIVE COCCI IN CLUSTERS IN BOTH AEROBIC AND ANAEROBIC BOTTLES CRITICAL RESULT CALLED TO, READ BACK BY AND VERIFIED WITH: S. HOCUTT RN [id number] GREEN R CONFIRMED BY R. HOLMES    Culture   Final    STAPHYLOCOCCUS SPECIES (COAGULASE NEGATIVE) THE SIGNIFICANCE OF ISOLATING THIS ORGANISM FROM A SINGLE SET OF BLOOD CULTURES WHEN MULTIPLE SETS ARE DRAWN IS UNCERTAIN. PLEASE NOTIFY THE MICROBIOLOGY DEPARTMENT WITHIN ONE WEEK IF SPECIATION AND SENSITIVITIES ARE REQUIRED.    Report Status 06/11/2015 FINAL  Final  Culture, blood (routine x 2)     Status: None   Collection Time: 06/07/15 12:06 AM  Result Value Ref Range Status   Specimen Description BLOOD  RIGHT ARM  Final   Special Requests BOTTLES DRAWN AEROBIC AND ANAEROBIC 5CC EA  Final   Culture NO GROWTH 5 DAYS  Final   Report Status 06/12/2015 FINAL  Final  Urine culture     Status: None   Collection Time: 06/07/15 12:37 AM  Result Value Ref Range Status   Specimen Description URINE, CATHETERIZED  Final   Special Requests NONE  Final   Culture 2,000 COLONIES/mL INSIGNIFICANT GROWTH  Final   Report Status 06/08/2015 FINAL  Final  Clostridium Difficile by PCR (not at [facility])     Status: Abnormal   Collection Time: 06/08/15 12:49 PM  Result Value Ref Range Status   C difficile by pcr POSITIVE (A) NEGATIVE Final    Comment: CRITICAL RESULT CALLED TO, READ BACK BY AND VERIFIED WITH: Madelyn Brunner RN 14:10 06/08/15 ([email or web address])   Culture, blood (routine x 2)     Status: None (Preliminary result)   Collection Time: 06/11/15  6:40 PM  Result  Value Ref Range Status   Specimen Description BLOOD LEFT ANTECUBITAL  Final   Special Requests BOTTLES DRAWN AEROBIC AND ANAEROBIC 10CC EACH  Final   Culture NO GROWTH 3 DAYS  Final   Report Status PENDING  Incomplete  Culture, blood (routine x 2)     Status: None (Preliminary result)   Collection Time: 06/11/15  6:47 PM  Result Value Ref Range Status   Specimen Description BLOOD RIGHT HAND  Final   Special Requests BOTTLES DRAWN AEROBIC AND ANAEROBIC 10CC EACH  Final   Culture NO GROWTH 3 DAYS  Final   Report Status PENDING  Incomplete      Studies/Results: No results found.  Medications:  Scheduled: . feeding supplement (ENSURE ENLIVE)  237 mL Oral BID BM  . heparin  5,000 Units Subcutaneous 3 times per day  . multivitamin with minerals  1 tablet Oral Daily  . neomycin-bacitracin-polymyxin  1 application Topical BID  . nicotine  21 mg Transdermal Daily  . piperacillin-tazobactam (ZOSYN)  IV  3.375 g Intravenous 3 times per day  . QUEtiapine  25 mg Oral QHS  . saccharomyces boulardii  250 mg Oral BID  . sodium chloride  3 mL Intravenous Q12H  . vancomycin  1,250 mg Intravenous Q12H  . vancomycin  125 mg Oral 4 times per day  . vitamin B-12  1,000 mcg Oral Daily   Continuous: . 0.9 % NaCl with KCl 20 mEq / L 30 mL/hr at 06/14/15 1148   WSF:[id number], oxyCODONE-acetaminophen     LOS: 8 days   [facility] A  [facility] Pager [phone]  06/15/2015, 11:36 AM  If 7PM-7AM, please contact night-coverage at [email or web address], password [facility]

## 2015-06-16 DIAGNOSIS — Z720 Tobacco use: Secondary | ICD-10-CM

## 2015-06-16 DIAGNOSIS — L8990 Pressure ulcer of unspecified site, unspecified stage: Secondary | ICD-10-CM | POA: Insufficient documentation

## 2015-06-16 DIAGNOSIS — R569 Unspecified convulsions: Secondary | ICD-10-CM

## 2015-06-16 LAB — URINALYSIS, ROUTINE W REFLEX MICROSCOPIC
Bilirubin Urine: NEGATIVE
Glucose, UA: NEGATIVE mg/dL
Hgb urine dipstick: NEGATIVE
Ketones, ur: NEGATIVE mg/dL
LEUKOCYTES UA: NEGATIVE
Nitrite: NEGATIVE
PH: 5.5 (ref 5.0–8.0)
Protein, ur: NEGATIVE mg/dL
Specific Gravity, Urine: 1.019 (ref 1.005–1.030)
UROBILINOGEN UA: 0.2 mg/dL (ref 0.0–1.0)

## 2015-06-16 LAB — CULTURE, BLOOD (ROUTINE X 2)
Culture: NO GROWTH
Culture: NO GROWTH

## 2015-06-16 LAB — GLUCOSE, CAPILLARY
GLUCOSE-CAPILLARY: 123 mg/dL — AB (ref 65–99)
GLUCOSE-CAPILLARY: 88 mg/dL (ref 65–99)
Glucose-Capillary: 142 mg/dL — ABNORMAL HIGH (ref 65–99)

## 2015-06-16 LAB — VANCOMYCIN, TROUGH: Vancomycin Tr: 15 ug/mL (ref 10.0–20.0)

## 2015-06-16 MED ORDER — COLLAGENASE 250 UNIT/GM EX OINT
TOPICAL_OINTMENT | Freq: Every day | CUTANEOUS | Status: DC
  Administered 2015-06-16: 16:00:00 via TOPICAL
  Administered 2015-06-17: 1 via TOPICAL
  Filled 2015-06-16: qty 30

## 2015-06-16 NOTE — Progress Notes (Signed)
ANTIBIOTIC CONSULT NOTE - FOLLOW UP  Pharmacy Consult for Vancomycin + zosyn Indication: HCAP  No Known Allergies  Patient Measurements: Height: 5\' 9"  (175.3 cm) Weight: 170 lb 6.4 oz (77.293 kg) IBW/kg (Calculated) : 70.7  Vital Signs: Temp: 99.1 F (37.3 C) (07/07 1033) Temp Source: Oral (07/07 1033) BP: 115/73 mmHg (07/07 1033) Pulse Rate: 88 (07/07 1033) Intake/Output from previous day: 07/06 0701 - 07/07 0700 In: -  Out: 3801 [Urine:3800; Stool:1] Intake/Output from this shift: Total I/O In: 240 [P.O.:240] Out: -   Labs:  Recent Labs  06/14/15 0615  WBC 7.5  HGB 9.3*  PLT 496*  CREATININE 0.84   Estimated Creatinine Clearance: 109.9 mL/min (by C-G formula based on Cr of 0.84).  Recent Labs  06/16/15 1205  [place] 15     Microbiology: Recent Results (from the past 720 hour(s))  MRSA PCR Screening     Status: None   Collection Time: 05/24/15  1:50 AM  Result Value Ref Range Status   MRSA by PCR NEGATIVE NEGATIVE Final    Comment:        The GeneXpert MRSA Assay (FDA approved for NASAL specimens only), is one component of a comprehensive MRSA colonization surveillance program. It is not intended to diagnose MRSA infection nor to guide or monitor treatment for MRSA infections.   Culture, blood (routine x 2)     Status: None   Collection Time: 06/06/15 11:30 PM  Result Value Ref Range Status   Specimen Description BLOOD RIGHT FOREARM  Final   Special Requests BOTTLES DRAWN AEROBIC AND ANAEROBIC 5CC EA  Final   Culture  Setup Time   Final    GRAM POSITIVE COCCI IN CLUSTERS IN BOTH AEROBIC AND ANAEROBIC BOTTLES CRITICAL RESULT CALLED TO, READ BACK BY AND VERIFIED WITH: S. HOCUTT RN [id number] [phone] GREEN R CONFIRMED BY R. HOLMES    Culture   Final    STAPHYLOCOCCUS SPECIES (COAGULASE NEGATIVE) THE SIGNIFICANCE OF ISOLATING THIS ORGANISM FROM A SINGLE SET OF BLOOD CULTURES WHEN MULTIPLE SETS ARE DRAWN IS UNCERTAIN. PLEASE NOTIFY THE MICROBIOLOGY  DEPARTMENT WITHIN ONE WEEK IF SPECIATION AND SENSITIVITIES ARE REQUIRED.    Report Status 06/11/2015 FINAL  Final  Culture, blood (routine x 2)     Status: None   Collection Time: 06/07/15 12:06 AM  Result Value Ref Range Status   Specimen Description BLOOD RIGHT ARM  Final   Special Requests BOTTLES DRAWN AEROBIC AND ANAEROBIC 5CC EA  Final   Culture NO GROWTH 5 DAYS  Final   Report Status 06/12/2015 FINAL  Final  Urine culture     Status: None   Collection Time: 06/07/15 12:37 AM  Result Value Ref Range Status   Specimen Description URINE, CATHETERIZED  Final   Special Requests NONE  Final   Culture 2,000 COLONIES/mL INSIGNIFICANT GROWTH  Final   Report Status 06/08/2015 FINAL  Final  Clostridium Difficile by PCR (not at [facility])     Status: Abnormal   Collection Time: 06/08/15 12:49 PM  Result Value Ref Range Status   C difficile by pcr POSITIVE (A) NEGATIVE Final    Comment: CRITICAL RESULT CALLED TO, READ BACK BY AND VERIFIED WITH: Madelyn Brunner RN 14:10 06/08/15 (wilsonm)   Culture, blood (routine x 2)     Status: None   Collection Time: 06/11/15  6:40 PM  Result Value Ref Range Status   Specimen Description BLOOD LEFT ANTECUBITAL  Final   Special Requests BOTTLES DRAWN AEROBIC AND ANAEROBIC [place]  Final   Culture NO GROWTH 5 DAYS  Final   Report Status 06/16/2015 FINAL  Final  Culture, blood (routine x 2)     Status: None   Collection Time: 06/11/15  6:47 PM  Result Value Ref Range Status   Specimen Description BLOOD RIGHT HAND  Final   Special Requests BOTTLES DRAWN AEROBIC AND ANAEROBIC 10CC EACH  Final   Culture NO GROWTH 5 DAYS  Final   Report Status 06/16/2015 FINAL  Final    Assessment: 63 yom continues on vancomycin and zosyn for r/o PNA. He is alson on oral vanc for Cdiff. Tmax is 102.9 and WBC is WNL. Hx quad, renal fxn may be overestimated. A trough today was therapeutic at 15.   Vanciomycin IV 6/28>>6/30.  7/3>> Vancomycin po 7/2>> Zosyn 6/28>>6/29    7/3>> Flagyl 6/29>>7/2  6/27 Blood - CNS 1/2 6/28 Urine - NEG 6/29 Cdiff - POS 7/2 Blood - NGTD  6/30 VT 13 on 1g IV q12h  Goal of Therapy:  Vancomycin trough level 15-20 mcg/ml  Plan:  - Continue vanc 1250mg  IV Q12H - Continue zosyn 3.375gm IV Q8H (4 hr inf) - F/u renal fxn, C&S, clinical status and trough as needed  Salome Arnt, PharmD, BCPS Pager # [phone] 06/16/2015 1:35 PM

## 2015-06-16 NOTE — Progress Notes (Signed)
Pt refuses to be turned after education given about pressure ulcers. He has a stage II pressure ulcer on bottom with some drainage that was cleaned and currently has clean foam dressing. Will continue to monitor.

## 2015-06-16 NOTE — Consult Note (Signed)
WOC wound consult note Reason for Consult: Unstageable pressure ulcer to sacrum and bilateral upper buttocks at the apex of the gluteal cleft.   Present on admission.  Wound type:Pressure ulcer Pressure Ulcer POA: Yes Measurement:5 cm x 10 cm unable to visualize wound bed due to the presence of thin layer of dark gray eschar.  Wound bed:100% devitalized tissue Drainage (amount, consistency, odor) Minimal serosanguinous drainage.  Necrotic odor.  Periwound:Intact Bilateral heels are intact. Offloaded in boot.  Due to immobility and high risk for further breakdown, will order a LALM through [employer].  Albumin 2.1  Dressing procedure/placement/frequency:Cleanse sacral ulcer with NS and pat gently dry.  Apply Santyl ointment, 1/8 inch thickness (opaque).  Cover with NS moist kerlix.  Cover with ABD pad and tape.  Change daily for enzymatic debridement. Will not follow at this time.  Please re-consult if needed.  Domenic Moras RN BSN [place] Pager [phone]

## 2015-06-16 NOTE — Progress Notes (Signed)
Occupational Therapy Treatment Patient Details Name: Phillip [name] MRN: [id number] DOB: 19-Jul-1966 Today's Date: 06/16/2015    History of present illness 49 year old African-American male with recent cervical spine injury, status post fusion surgery on June 20, quadriplegia as a result of the spinal injury, presented with altered mental status, fever and diarrhea. Patient was was living in a skilled nursing facility. preliminary blood cultures show gram positive cocci in clusters and he is positive for C. Diff.AMS likely secondary to infection and now improved.   OT comments  Patient continues to make progress. Encourage tricep exercises while supine in bed using bed rails. Encourage patient to use ucuff with pencil to work on pushing call bell/tv buttons. Encourage use of WHO with ucuff for self-feeding. Also reiterated education and importance of pressure relief while in supine and sitting positions.    Follow Up Recommendations  Supervision/Assistance - 24 hour;SNF    Equipment Recommendations  Other (comment) (TBD next venue of care)    Recommendations for Other Services  None at this time   Precautions / Restrictions Precautions Precautions: Cervical;Fall Precaution Comments: C6 quad and hard cervical collar Restrictions Weight Bearing Restrictions: Yes RLE Weight Bearing: Non weight bearing Other Position/Activity Restrictions: Allow PROM to right ankle       Mobility Bed Mobility General bed mobility comments: Assisted with positioning using pillows -> BUEs  Transfers General transfer comment: did not occur        ADL Overall ADL's : Needs assistance/impaired General ADL Comments: Engaged patient in [place] strengthening exercises, see below. Also worked with patient on using ucuff with pencil to use with call bell/tv buttons.     Cognition   Behavior During Therapy: WFL for tasks assessed/performed Overall Cognitive Status: Within Functional Limits for  tasks assessed     Exercises General Exercises - Upper Extremity Shoulder Flexion: AAROM;Both;10 reps;Supine Shoulder Extension: AAROM;Both;10 reps;Supine Elbow Flexion: AAROM;Both;10 reps;Supine Elbow Extension: AAROM;Both;10 reps;Supine Wrist Flexion: AAROM;Both;10 reps;Supine Wrist Extension: AAROM;Both;10 reps;Supine           Pertinent Vitals/ Pain       Pain Assessment: Faces Faces Pain Scale: Hurts little more Pain Location: right shoulder Pain Descriptors / Indicators: Grimacing Pain Intervention(s): Monitored during session;Repositioned;Relaxation (encouraged scapular depression)   Frequency Min 3X/week     Progress Toward Goals  OT Goals(current goals can now befound in the care plan section)  Progress towards OT goals: Progressing toward goals     Plan Discharge plan remains appropriate    End of Session Equipment Utilized During Treatment: Cervical collar   Activity Tolerance Patient tolerated treatment well   Patient Left with call bell/phone within reach;in bed  Nurse Communication Other (comment);Need for lift equipment (recommendation to get patient to chair, and pt's need assistance for lunch)        Time: 2025-4270 OT Time Calculation (min): 47 min  Charges: OT General Charges $OT Visit: 1 Procedure OT Treatments $Self Care/Home Management : 8-22 mins $Therapeutic Exercise: 23-37 mins  Oaklee Sunga , MS, OTR/L, CLT Pager: [phone]  06/16/2015, 12:18 PM

## 2015-06-16 NOTE — Progress Notes (Signed)
Patient ID: Phillip [name], male   DOB: 18-Oct-1966, 49 y.o.   MRN: [id number]         [place] [facility] for [facility] Disease    Date of Admission:  06/06/2015           Day 9 C. difficile therapy        Day 5 latest round of vancomycin and piperacillin tazobactam  Principal Problem:   HAP (hospital-acquired pneumonia) Active Problems:   Tobacco abuse   Cervical spinal cord injury   Spinal cord injury at C5-C7 level without injury of spinal bone   Forehead laceration   Ankle fracture, right   Quadriplegia   Hallucinations   Acute encephalopathy   Seizures   Bacteremia   Normocytic anemia   C. difficile diarrhea   Pressure ulcer   . collagenase   Topical Daily  . feeding supplement (ENSURE ENLIVE)  237 mL Oral BID BM  . heparin  5,000 Units Subcutaneous 3 times per day  . multivitamin with minerals  1 tablet Oral Daily  . neomycin-bacitracin-polymyxin  1 application Topical BID  . nicotine  21 mg Transdermal Daily  . piperacillin-tazobactam (ZOSYN)  IV  3.375 g Intravenous 3 times per day  . QUEtiapine  25 mg Oral QHS  . saccharomyces boulardii  250 mg Oral BID  . sodium chloride  3 mL Intravenous Q12H  . vancomycin  1,250 mg Intravenous Q12H  . vancomycin  125 mg Oral 4 times per day  . vitamin B-12  1,000 mcg Oral Daily    Subjective: He is feeling better. He has had one diarrheal stool today. He has not had any significant cough or sputum production.  Review of Systems: Pertinent items are noted in HPI.  Past Medical History  Diagnosis Date  . DJD (degenerative joint disease)   . Seizures   . Broken hip   . Cervical spinal cord injury   . Tobacco abuse     History  Substance Use Topics  . Smoking status: Current Every Day Smoker -- 0.50 packs/day for .5 years    Types: Cigarettes  . Smokeless tobacco: Not on file  . Alcohol Use: 1.2 oz/week    14 Cans of beer per week     Comment: Pt has not drank since inj 2 weeks ago    Family History    Problem Relation Age of Onset  . Hypertension Mother    No Known Allergies  OBJECTIVE: Blood pressure 103/57, pulse 92, temperature 99.9 F (37.7 C), temperature source Oral, resp. rate 18, height 5\' 9"  (1.753 m), weight 170 lb 6.4 oz (77.293 kg), SpO2 100 %. General: He has a hard cervical collar in place. Lungs: Shallow respirations. Lungs are clear. Cor: Regular S1 and S2 with no murmurs Abdomen: Soft and quiet  Lab Results Lab Results  Component Value Date   WBC 7.5 06/14/2015   HGB 9.3* 06/14/2015   HCT 28.6* 06/14/2015   MCV 93.5 06/14/2015   PLT 496* 06/14/2015    Lab Results  Component Value Date   CREATININE 0.84 06/14/2015   BUN 9 06/14/2015   NA 136 06/14/2015   K 4.3 06/14/2015   CL 101 06/14/2015   CO2 25 06/14/2015    Lab Results  Component Value Date   ALT 64* 06/09/2015   AST 39 06/09/2015   ALKPHOS 77 06/09/2015   BILITOT 0.6 06/09/2015     Microbiology: Recent Results (from the past 240 hour(s))  Culture, blood (routine x 2)  Status: None   Collection Time: 06/06/15 11:30 PM  Result Value Ref Range Status   Specimen Description BLOOD RIGHT FOREARM  Final   Special Requests BOTTLES DRAWN AEROBIC AND ANAEROBIC 5CC EA  Final   Culture  Setup Time   Final    GRAM POSITIVE COCCI IN CLUSTERS IN BOTH AEROBIC AND ANAEROBIC BOTTLES CRITICAL RESULT CALLED TO, READ BACK BY AND VERIFIED WITH: S. HOCUTT RN [id number] GREEN R CONFIRMED BY R. HOLMES    Culture   Final    STAPHYLOCOCCUS SPECIES (COAGULASE NEGATIVE) THE SIGNIFICANCE OF ISOLATING THIS ORGANISM FROM A SINGLE SET OF BLOOD CULTURES WHEN MULTIPLE SETS ARE DRAWN IS UNCERTAIN. PLEASE NOTIFY THE MICROBIOLOGY DEPARTMENT WITHIN ONE WEEK IF SPECIATION AND SENSITIVITIES ARE REQUIRED.    Report Status 06/11/2015 FINAL  Final  Culture, blood (routine x 2)     Status: None   Collection Time: 06/07/15 12:06 AM  Result Value Ref Range Status   Specimen Description BLOOD RIGHT ARM  Final   Special  Requests BOTTLES DRAWN AEROBIC AND ANAEROBIC 5CC EA  Final   Culture NO GROWTH 5 DAYS  Final   Report Status 06/12/2015 FINAL  Final  Urine culture     Status: None   Collection Time: 06/07/15 12:37 AM  Result Value Ref Range Status   Specimen Description URINE, CATHETERIZED  Final   Special Requests NONE  Final   Culture 2,000 COLONIES/mL INSIGNIFICANT GROWTH  Final   Report Status 06/08/2015 FINAL  Final  Clostridium Difficile by PCR (not at [facility])     Status: Abnormal   Collection Time: 06/08/15 12:49 PM  Result Value Ref Range Status   C difficile by pcr POSITIVE (A) NEGATIVE Final    Comment: CRITICAL RESULT CALLED TO, READ BACK BY AND VERIFIED WITH: Madelyn Brunner RN 14:10 06/08/15 (wilsonm)   Culture, blood (routine x 2)     Status: None   Collection Time: 06/11/15  6:40 PM  Result Value Ref Range Status   Specimen Description BLOOD LEFT ANTECUBITAL  Final   Special Requests BOTTLES DRAWN AEROBIC AND ANAEROBIC 10CC EACH  Final   Culture NO GROWTH 5 DAYS  Final   Report Status 06/16/2015 FINAL  Final  Culture, blood (routine x 2)     Status: None   Collection Time: 06/11/15  6:47 PM  Result Value Ref Range Status   Specimen Description BLOOD RIGHT HAND  Final   Special Requests BOTTLES DRAWN AEROBIC AND ANAEROBIC 10CC EACH  Final   Culture NO GROWTH 5 DAYS  Final   Report Status 06/16/2015 FINAL  Final    Assessment: He is improving clinically without any other fever in the past 24 hours. I will stop vancomycin and piperacillin tazobactam and continue oral vancomycin for his C. difficile colitis.  Plan: 1. Discontinue IV vancomycin and piperacillin tazobactam 2. Continue oral vancomycin for 2 more weeks  Michel Bickers, MD [facility] for [place] [phone] pager   [phone] cell 06/16/2015, 2:43 PM

## 2015-06-16 NOTE — Progress Notes (Signed)
[facility] PROGRESS NOTE  Phillip [name] IRC:[id number] DOB: 1966-10-26 DOA: 06/06/2015  Brief HPI: 49 year old African-American male with recent cervical spine injury, status post fusion surgery on June 20, quadriplegia as a result of the spinal injury, presented with altered mental status, fever and diarrhea. Patient was was living in a skilled nursing facility. Apparently, his symptoms had been ongoing for 3 days. He was hospitalized for further management. He was positive for C. difficile. He had positive blood cultures which were contaminants. Patient continued to spike fevers. He was found to have a consolidation in his lung. ID was consulted. He was placed back on vancomycin and Zosyn.  Subjective: Fever of 102.9 at 2:40 PM yesterday, patient is still on vancomycin and Zosyn. Patient is still on Flagyl for C. difficile, stools are firming up. Awake, alert no suspicion for meningitis/encephalitis.  Assessment/Plan:  Principal Problem:   HAP (hospital-acquired pneumonia) Active Problems:   Tobacco abuse   Cervical spinal cord injury   Spinal cord injury at C5-C7 level without injury of spinal bone   Forehead laceration   Ankle fracture, right   Quadriplegia   Hallucinations   Acute encephalopathy   Seizures   Bacteremia   Normocytic anemia   C. difficile diarrhea   Pressure ulcer   Fever, likely secondary to pneumonia, which is probably hospital-acquired ID is following. Patient was placed back on vancomycin and Zosyn, which is being continued.  Repeat blood cultures with no growth so far.  Fever appears to be subsiding, buth he continues to have low-grade fever.  CT scan of the abdomen, pelvis did not show any acute intra-abdominal finding. Consolidation was noted in the right lung. Diarrhea is improving. Initial blood cultures did reveal coagulase-negative staph, which is thought to be a contaminant.  MRI of the cervical spine shows no evidence for infection.  Procalcitonin 0.33. Initial Lactic acid was 0.64.  No leukocytosis but patient still has fever for about 2.9, still on broad-spectrum antibiotics. Await ID recommendation, I will repeat blood cultures, procalcitonin in the interim, CBC for WBC and a.m.  C. difficile diarrhea Diarrhea seems to be improving. Since he was continuing to spike fever, oral Flagyl was changed over to oral vancomycin. Continue for now. Enteric precautions. Probiotics.  Acute encephalopathy This is resolved and is now back to baseline. This was likely secondary to his acute infection. EEG negative for seizure predisposition, mild slowing. Ammonia--35. B12 219, TSH 1.64, HIV nonreactive. Folic acid level is normal. Continue supplementation with vitamin B-12. CT brain negative for acute intracranial abnormalities. Seroquel was discontinued.   History of seizure disorder  Patient is not on any AEDs. CPK 132.  Hx of C spinal cord injury and s/p of C-spine fusion surgery:  Maintain cervical collar. No evidence for infection on MRI.  Mild AKI/hypokalemia/hypomagnesemia Likely due to volume depletion. Improved with IV fluids. Replace potassium and magnesium.  Normocytic anemia Drop in hemoglobin was noted initially. Now hemoglobin is stable. No overt bleeding identified. Continue to monitor. Continue vitamin B-12 supplementation.  Suprapubic mass secondary to distended bladder Resolved after Foley catheter placement. Will need to do voiding trial once he is better from his acute infection.  There is a concern that he could have neurogenic bladder.   Right ankle fracture  Status post ORIF 05/27/2015. Surgical site looks clean without any drainage or erythema. Has been seen by orthopedics as well.  Lower extremity edema and pain  No DVT noted on Doppler study.  Hx of Tobacco abuse no  tobacco since last hospitalization   DVT Prophylaxis: Subcutaneous heparin    Code Status: Full code  Family Communication:  Discussed with patient.  Disposition Plan: Will return to skilled nursing facility when ready for discharge. Wait for ID input regarding antibiotics.  Consultants: Neurology, infectious disease  Procedures: None.  Antibiotics: Zosyn 6/28-6/29 Vancomycin 6/28-7/1 Oral Flagyl. 629--7/2 Oral vancomycin 7/2 Placed back on vancomycin and Zosyn 7/3   Objective: Vital Signs  Filed Vitals:   06/15/15 2201 06/16/15 0153 06/16/15 0544 06/16/15 1033  BP: 105/60 106/65 106/72 115/73  Pulse: 92 88 91 88  Temp: 100.9 F (38.3 C) 100.2 F (37.9 C) 100.2 F (37.9 C) 99.1 F (37.3 C)  TempSrc: Oral Oral Oral Oral  Resp: 18 18 18 18   Height:      Weight:      SpO2: 97% 98% 98% 98%    Intake/Output Summary (Last 24 hours) at 06/16/15 1157 Last data filed at 06/16/15 0155  Gross per 24 hour  Intake      0 ml  Output   3801 ml  Net  -3801 ml   Filed Weights   06/06/15 2253 06/07/15 0400 06/10/15 0512  Weight: 74.844 kg (165 lb) 76.204 kg (168 lb) 77.293 kg (170 lb 6.4 oz)    General appearance: alert, cooperative, appears stated age and no distress. Neck collar is noted. Resp: clear to auscultation bilaterally. Diminished air entry at the bases. No definite crackles. Cardio: regular rate and rhythm, S1, S2 normal, no murmur, click, rub or gallop GI: soft, nontender today. bowel sounds normal; no masses,  no organomegaly Neurologic: movement appreciated in the upper extremities. None in the lower extremities. Incision site in the right lower extremity is clean.  Lab Results:  Basic Metabolic Panel:  Recent Labs Lab 06/12/15 0733 06/14/15 0615  NA 136 136  K 4.2 4.3  CL 102 101  CO2 26 25  GLUCOSE 106* 111*  BUN 10 9  CREATININE 0.77 0.84  CALCIUM 8.8* 9.0   Liver Function Tests: No results for input(s): AST, ALT, ALKPHOS, BILITOT, PROT, ALBUMIN in the last 168 hours. No results for input(s): AMMONIA in the last 168 hours. CBC:  Recent Labs Lab 06/12/15 0733  06/14/15 0615  WBC 7.3 7.5  HGB 8.5* 9.3*  HCT 26.3* 28.6*  MCV 92.0 93.5  PLT 527* 496*   CBG:  Recent Labs Lab 06/14/15 0646 06/15/15 0554 06/15/15 0806 06/16/15 0642 06/16/15 1052  GLUCAP 90 91 90 123* 88    Recent Results (from the past 240 hour(s))  Culture, blood (routine x 2)     Status: None   Collection Time: 06/06/15 11:30 PM  Result Value Ref Range Status   Specimen Description BLOOD RIGHT FOREARM  Final   Special Requests BOTTLES DRAWN AEROBIC AND ANAEROBIC 5CC EA  Final   Culture  Setup Time   Final    GRAM POSITIVE COCCI IN CLUSTERS IN BOTH AEROBIC AND ANAEROBIC BOTTLES CRITICAL RESULT CALLED TO, READ BACK BY AND VERIFIED WITH: S. HOCUTT RN [id number] GREEN R CONFIRMED BY R. HOLMES    Culture   Final    STAPHYLOCOCCUS SPECIES (COAGULASE NEGATIVE) THE SIGNIFICANCE OF ISOLATING THIS ORGANISM FROM A SINGLE SET OF BLOOD CULTURES WHEN MULTIPLE SETS ARE DRAWN IS UNCERTAIN. PLEASE NOTIFY THE MICROBIOLOGY DEPARTMENT WITHIN ONE WEEK IF SPECIATION AND SENSITIVITIES ARE REQUIRED.    Report Status 06/11/2015 FINAL  Final  Culture, blood (routine x 2)     Status: None   Collection  Time: 06/07/15 12:06 AM  Result Value Ref Range Status   Specimen Description BLOOD RIGHT ARM  Final   Special Requests BOTTLES DRAWN AEROBIC AND ANAEROBIC 5CC EA  Final   Culture NO GROWTH 5 DAYS  Final   Report Status 06/12/2015 FINAL  Final  Urine culture     Status: None   Collection Time: 06/07/15 12:37 AM  Result Value Ref Range Status   Specimen Description URINE, CATHETERIZED  Final   Special Requests NONE  Final   Culture 2,000 COLONIES/mL INSIGNIFICANT GROWTH  Final   Report Status 06/08/2015 FINAL  Final  Clostridium Difficile by PCR (not at [facility])     Status: Abnormal   Collection Time: 06/08/15 12:49 PM  Result Value Ref Range Status   C difficile by pcr POSITIVE (A) NEGATIVE Final    Comment: CRITICAL RESULT CALLED TO, READ BACK BY AND VERIFIED WITH: Madelyn Brunner RN 14:10  06/08/15 (wilsonm)   Culture, blood (routine x 2)     Status: None (Preliminary result)   Collection Time: 06/11/15  6:40 PM  Result Value Ref Range Status   Specimen Description BLOOD LEFT ANTECUBITAL  Final   Special Requests BOTTLES DRAWN AEROBIC AND ANAEROBIC 10CC EACH  Final   Culture NO GROWTH 4 DAYS  Final   Report Status PENDING  Incomplete  Culture, blood (routine x 2)     Status: None (Preliminary result)   Collection Time: 06/11/15  6:47 PM  Result Value Ref Range Status   Specimen Description BLOOD RIGHT HAND  Final   Special Requests BOTTLES DRAWN AEROBIC AND ANAEROBIC 10CC EACH  Final   Culture NO GROWTH 4 DAYS  Final   Report Status PENDING  Incomplete      Studies/Results: No results found.  Medications:  Scheduled: . feeding supplement (ENSURE ENLIVE)  237 mL Oral BID BM  . heparin  5,000 Units Subcutaneous 3 times per day  . multivitamin with minerals  1 tablet Oral Daily  . neomycin-bacitracin-polymyxin  1 application Topical BID  . nicotine  21 mg Transdermal Daily  . piperacillin-tazobactam (ZOSYN)  IV  3.375 g Intravenous 3 times per day  . QUEtiapine  25 mg Oral QHS  . saccharomyces boulardii  250 mg Oral BID  . sodium chloride  3 mL Intravenous Q12H  . vancomycin  1,250 mg Intravenous Q12H  . vancomycin  125 mg Oral 4 times per day  . vitamin B-12  1,000 mcg Oral Daily   Continuous:   WSF:[id number], oxyCODONE-acetaminophen     LOS: 9 days   [facility]  [facility] Pager [phone]  06/16/2015, 11:57 AM  If 7PM-7AM, please contact night-coverage at [email or web address], password [facility]

## 2015-06-16 NOTE — Clinical Social Work Note (Signed)
Patient has a bed at [facility]. CSW will continue to follow patient and pt's family for continued support and to facilitate pt's discharge needs once medically stable.  CSW remains available as needed.   Glendon Axe, MSW, LCSWA [phone] 06/16/2015 4:22 PM

## 2015-06-17 DIAGNOSIS — R443 Hallucinations, unspecified: Secondary | ICD-10-CM

## 2015-06-17 LAB — CBC
HEMATOCRIT: 24.4 % — AB (ref 39.0–52.0)
Hemoglobin: 8.4 g/dL — ABNORMAL LOW (ref 13.0–17.0)
MCH: 31.1 pg (ref 26.0–34.0)
MCHC: 34.4 g/dL (ref 30.0–36.0)
MCV: 90.4 fL (ref 78.0–100.0)
PLATELETS: 617 10*3/uL — AB (ref 150–400)
RBC: 2.7 MIL/uL — ABNORMAL LOW (ref 4.22–5.81)
RDW: 12.4 % (ref 11.5–15.5)
WBC: 8.1 10*3/uL (ref 4.0–10.5)

## 2015-06-17 LAB — URINE CULTURE: CULTURE: NO GROWTH

## 2015-06-17 LAB — GLUCOSE, CAPILLARY: GLUCOSE-CAPILLARY: 86 mg/dL (ref 65–99)

## 2015-06-17 LAB — BASIC METABOLIC PANEL
ANION GAP: 8 (ref 5–15)
BUN: 11 mg/dL (ref 6–20)
CHLORIDE: 99 mmol/L — AB (ref 101–111)
CO2: 29 mmol/L (ref 22–32)
Calcium: 9.2 mg/dL (ref 8.9–10.3)
Creatinine, Ser: 0.72 mg/dL (ref 0.61–1.24)
GFR calc Af Amer: >60 mL/min (ref >60)
GFR calc non Af Amer: >60 mL/min (ref >60)
Glucose, Bld: 92 mg/dL (ref 65–99)
POTASSIUM: 4.4 mmol/L (ref 3.5–5.1)
Sodium: 136 mmol/L (ref 135–145)

## 2015-06-17 MED ORDER — SACCHAROMYCES BOULARDII 250 MG PO CAPS: 250.0000 mg | ORAL_CAPSULE | Freq: Two times a day (BID) | ORAL | Status: AC

## 2015-06-17 MED ORDER — ADULT MULTIVITAMIN W/MINERALS CH: 1.0000 | ORAL_TABLET | Freq: Every day | ORAL | Status: AC

## 2015-06-17 MED ORDER — VANCOMYCIN 50 MG/ML ORAL SOLUTION: 125.0000 mg | Freq: Four times a day (QID) | ORAL | Status: DC

## 2015-06-17 MED ORDER — COLLAGENASE 250 UNIT/GM EX OINT: TOPICAL_OINTMENT | Freq: Every day | CUTANEOUS | Status: DC

## 2015-06-17 MED ORDER — OXYCODONE-ACETAMINOPHEN 5-325 MG PO TABS: 1.0000 | ORAL_TABLET | ORAL | Status: DC | PRN

## 2015-06-17 MED ORDER — CYANOCOBALAMIN 1000 MCG PO TABS: 1000.0000 ug | ORAL_TABLET | Freq: Every day | ORAL | Status: AC

## 2015-06-17 MED ORDER — ENSURE ENLIVE PO LIQD: 237.0000 mL | Freq: Two times a day (BID) | ORAL | Status: DC

## 2015-06-17 NOTE — Clinical Social Work Note (Signed)
Clinical Social Worker facilitated patient discharge including contacting patient family and facility to confirm patient discharge plans.  Clinical information faxed to facility and family agreeable with plan.  CSW arranged ambulance transport via [facility] to [facility].  RN to call report prior to discharge.  DC packet prepared and on chart for transport with number for report.  Clinical Social Worker will sign off for now as social work intervention is no longer needed. Please consult [place] again if new need arises.  Glendon Axe, MSW, LCSWA [phone] 06/17/2015 12:47 PM

## 2015-06-17 NOTE — Progress Notes (Signed)
Patient ID: Phillip [name], male   DOB: 11-09-1966, 49 y.o.   MRN: [id number]         [place] for Infectious Disease    Date of Admission:  06/06/2015           Day 10 C. difficile therapy  Principal Problem:   HAP (hospital-acquired pneumonia) Active Problems:   Tobacco abuse   Cervical spinal cord injury   Spinal cord injury at C5-C7 level without injury of spinal bone   Forehead laceration   Ankle fracture, right   Quadriplegia   Hallucinations   Acute encephalopathy   Seizures   Bacteremia   Normocytic anemia   C. difficile diarrhea   Pressure ulcer   . collagenase   Topical Daily  . feeding supplement (ENSURE ENLIVE)  237 mL Oral BID BM  . heparin  5,000 Units Subcutaneous 3 times per day  . multivitamin with minerals  1 tablet Oral Daily  . neomycin-bacitracin-polymyxin  1 application Topical BID  . nicotine  21 mg Transdermal Daily  . QUEtiapine  25 mg Oral QHS  . saccharomyces boulardii  250 mg Oral BID  . sodium chloride  3 mL Intravenous Q12H  . vancomycin  125 mg Oral 4 times per day  . vitamin B-12  1,000 mcg Oral Daily    Subjective: He is feeling better. He has had one diarrheal stool last night.  Review of Systems: Pertinent items are noted in HPI.  Past Medical History  Diagnosis Date  . DJD (degenerative joint disease)   . Seizures   . Broken hip   . Cervical spinal cord injury   . Tobacco abuse     History  Substance Use Topics  . Smoking status: Current Every Day Smoker -- 0.50 packs/day for .5 years    Types: Cigarettes  . Smokeless tobacco: Not on file  . Alcohol Use: 1.2 oz/week    14 Cans of beer per week     Comment: Pt has not drank since inj 2 weeks ago    Family History  Problem Relation Age of Onset  . Hypertension Mother    No Known Allergies  OBJECTIVE: Blood pressure 121/70, pulse 96, temperature 99.3 F (37.4 C), temperature source Oral, resp. rate 18, height 5\' 9"  (1.753 m), weight 170 lb 6.4 oz  (77.293 kg), SpO2 100 %. General: He has a hard cervical collar in place. Lungs: Lungs are clear. Cor: Regular S1 and S2 with no murmurs Abdomen: Soft and quiet  Lab Results Lab Results  Component Value Date   WBC 8.1 06/17/2015   HGB 8.4* 06/17/2015   HCT 24.4* 06/17/2015   MCV 90.4 06/17/2015   PLT 617* 06/17/2015    Lab Results  Component Value Date   CREATININE 0.72 06/17/2015   BUN 11 06/17/2015   NA 136 06/17/2015   K 4.4 06/17/2015   CL 99* 06/17/2015   CO2 29 06/17/2015    Lab Results  Component Value Date   ALT 64* 06/09/2015   AST 39 06/09/2015   ALKPHOS 77 06/09/2015   BILITOT 0.6 06/09/2015     Microbiology: Recent Results (from the past 240 hour(s))  Clostridium Difficile by PCR (not at [facility])     Status: Abnormal   Collection Time: 06/08/15 12:49 PM  Result Value Ref Range Status   C difficile by pcr POSITIVE (A) NEGATIVE Final    Comment: CRITICAL RESULT CALLED TO, READ BACK BY AND VERIFIED WITH: Madelyn Brunner RN 14:10 06/08/15 (  wilsonm)   Culture, blood (routine x 2)     Status: None   Collection Time: 06/11/15  6:40 PM  Result Value Ref Range Status   Specimen Description BLOOD LEFT ANTECUBITAL  Final   Special Requests BOTTLES DRAWN AEROBIC AND ANAEROBIC 10CC EACH  Final   Culture NO GROWTH 5 DAYS  Final   Report Status 06/16/2015 FINAL  Final  Culture, blood (routine x 2)     Status: None   Collection Time: 06/11/15  6:47 PM  Result Value Ref Range Status   Specimen Description BLOOD RIGHT HAND  Final   Special Requests BOTTLES DRAWN AEROBIC AND ANAEROBIC 10CC EACH  Final   Culture NO GROWTH 5 DAYS  Final   Report Status 06/16/2015 FINAL  Final  Urine culture     Status: None   Collection Time: 06/16/15  1:02 PM  Result Value Ref Range Status   Specimen Description URINE, RANDOM  Final   Special Requests NONE  Final   Culture NO GROWTH 1 DAY  Final   Report Status 06/17/2015 FINAL  Final    Assessment: He is improving clinically without any  recent fever. I would continue oral vancomycin for his C. difficile colitis.  Plan: 1. Continue oral vancomycin for 13 more days 2. Please call Dr. Baxter Flattery ([phone]) for any infectious disease questions this weekend  Michel Bickers, MD [facility] for [place] [phone] pager   [phone] cell 06/17/2015, 1:58 PM

## 2015-06-17 NOTE — Progress Notes (Signed)
Report given to [facility].
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
## 2015-06-17 NOTE — Progress Notes (Signed)
Discharge orders received. Pt notified and verbalized understanding. Pt dressed and bathed. Sacral dressing changed d/t bm. Belongings packed. IVs removed. OT instructions placed in pt's bags along with equipment. Attempted 1x to give report to [employer]. Will try again. [facility] arrived to transport pt.

## 2015-06-17 NOTE — Clinical Social Work Note (Signed)
Patient has a bed at [facility]. CSW will continue to follow patient and pt's family for continued support and to facilitate pt's discharge needs once medically stable.  CSW remains available as needed.   Glendon Axe, MSW, LCSWA [phone] 06/17/2015 11:01 AM

## 2015-06-17 NOTE — Clinical Social Work Placement (Signed)
   CLINICAL SOCIAL WORK PLACEMENT  NOTE  Date:  06/17/2015  Patient Details  Name: Phillip [name] MRN: [id number] Date of Birth: 1966-07-15  Clinical Social Work is seeking post-discharge placement for this patient at the [place] level of care (*CSW will initial, date and re-position this form in  chart as items are completed):  Yes   Patient/family provided with [place] Work Department's list of facilities offering this level of care within the geographic area requested by the patient (or if unable, by the patient's family).  Yes   Patient/family informed of their freedom to choose among providers that offer the needed level of care, that participate in Medicare, Medicaid or managed care program needed by the patient, have an available bed and are willing to accept the patient.  Yes   Patient/family informed of [place]'s ownership interest in [facility] and [facility], as well as of the fact that they are under no obligation to receive care at these facilities.  PASRR submitted to EDS on       PASRR number received on       Existing PASRR number confirmed on 06/09/15     FL2 transmitted to all facilities in geographic area requested by pt/family on 06/09/15     FL2 transmitted to all facilities within larger geographic area on       Patient informed that his/her managed care company has contracts with or will negotiate with certain facilities, including the following:        Yes   Patient/family informed of bed offers received.  Patient [facility] bed at [facility]     Physician recommends and patient chooses bed at      Patient to be transferred to [facility] on 06/17/15.  Patient to be transferred to facility by  Corey Harold )     Patient family notified on 06/17/15 of transfer.  Name of family member notified:   (Pt's sisters, [name] and Beau Fanny)     PHYSICIAN Please sign FL2, Please prepare prescriptions      Additional Comment:    _______________________________________________ Glendon Axe, MSW, LCSWA [phone] 06/17/2015 12:46 PM

## 2015-06-21 LAB — CULTURE, BLOOD (ROUTINE X 2)
CULTURE: NO GROWTH
CULTURE: NO GROWTH

## 2015-08-05 ENCOUNTER — Encounter (HOSPITAL_COMMUNITY): Payer: Self-pay | Admitting: Emergency Medicine

## 2015-08-05 ENCOUNTER — Emergency Department (HOSPITAL_COMMUNITY): Payer: Medicaid Other

## 2015-08-05 ENCOUNTER — Inpatient Hospital Stay (HOSPITAL_COMMUNITY)
Admission: EM | Admit: 2015-08-05 | Discharge: 2015-08-26 | DRG: 853 | Disposition: A | Discharge status: Skilled Nursing Facility | Payer: Medicaid Other | Attending: Internal Medicine | Admitting: Internal Medicine

## 2015-08-05 DIAGNOSIS — S0181XD Laceration without foreign body of other part of head, subsequent encounter: Secondary | ICD-10-CM

## 2015-08-05 DIAGNOSIS — B9689 Other specified bacterial agents as the cause of diseases classified elsewhere: Secondary | ICD-10-CM | POA: Diagnosis present

## 2015-08-05 DIAGNOSIS — F6089 Other specific personality disorders: Secondary | ICD-10-CM | POA: Diagnosis present

## 2015-08-05 DIAGNOSIS — L0291 Cutaneous abscess, unspecified: Secondary | ICD-10-CM | POA: Diagnosis not present

## 2015-08-05 DIAGNOSIS — Z7901 Long term (current) use of anticoagulants: Secondary | ICD-10-CM | POA: Diagnosis not present

## 2015-08-05 DIAGNOSIS — Z792 Long term (current) use of antibiotics: Secondary | ICD-10-CM

## 2015-08-05 DIAGNOSIS — Z981 Arthrodesis status: Secondary | ICD-10-CM

## 2015-08-05 DIAGNOSIS — J189 Pneumonia, unspecified organism: Secondary | ICD-10-CM

## 2015-08-05 DIAGNOSIS — R7989 Other specified abnormal findings of blood chemistry: Secondary | ICD-10-CM

## 2015-08-05 DIAGNOSIS — IMO0001 Reserved for inherently not codable concepts without codable children: Secondary | ICD-10-CM

## 2015-08-05 DIAGNOSIS — D638 Anemia in other chronic diseases classified elsewhere: Secondary | ICD-10-CM | POA: Diagnosis present

## 2015-08-05 DIAGNOSIS — E43 Unspecified severe protein-calorie malnutrition: Secondary | ICD-10-CM | POA: Diagnosis present

## 2015-08-05 DIAGNOSIS — E875 Hyperkalemia: Secondary | ICD-10-CM | POA: Diagnosis not present

## 2015-08-05 DIAGNOSIS — F438 Other reactions to severe stress: Secondary | ICD-10-CM

## 2015-08-05 DIAGNOSIS — S14105S Unspecified injury at C5 level of cervical spinal cord, sequela: Secondary | ICD-10-CM

## 2015-08-05 DIAGNOSIS — I129 Hypertensive chronic kidney disease with stage 1 through stage 4 chronic kidney disease, or unspecified chronic kidney disease: Secondary | ICD-10-CM | POA: Diagnosis present

## 2015-08-05 DIAGNOSIS — G8250 Quadriplegia, unspecified: Secondary | ICD-10-CM

## 2015-08-05 DIAGNOSIS — S14109A Unspecified injury at unspecified level of cervical spinal cord, initial encounter: Secondary | ICD-10-CM

## 2015-08-05 DIAGNOSIS — M868X8 Other osteomyelitis, other site: Secondary | ICD-10-CM | POA: Diagnosis present

## 2015-08-05 DIAGNOSIS — R443 Hallucinations, unspecified: Secondary | ICD-10-CM

## 2015-08-05 DIAGNOSIS — Z7401 Bed confinement status: Secondary | ICD-10-CM

## 2015-08-05 DIAGNOSIS — B965 Pseudomonas (aeruginosa) (mallei) (pseudomallei) as the cause of diseases classified elsewhere: Secondary | ICD-10-CM | POA: Diagnosis present

## 2015-08-05 DIAGNOSIS — M869 Osteomyelitis, unspecified: Secondary | ICD-10-CM | POA: Diagnosis not present

## 2015-08-05 DIAGNOSIS — R569 Unspecified convulsions: Secondary | ICD-10-CM

## 2015-08-05 DIAGNOSIS — Z8619 Personal history of other infectious and parasitic diseases: Secondary | ICD-10-CM

## 2015-08-05 DIAGNOSIS — D509 Iron deficiency anemia, unspecified: Secondary | ICD-10-CM | POA: Diagnosis present

## 2015-08-05 DIAGNOSIS — N179 Acute kidney failure, unspecified: Secondary | ICD-10-CM

## 2015-08-05 DIAGNOSIS — R7881 Bacteremia: Secondary | ICD-10-CM

## 2015-08-05 DIAGNOSIS — R159 Full incontinence of feces: Secondary | ICD-10-CM | POA: Diagnosis present

## 2015-08-05 DIAGNOSIS — R740 Nonspecific elevation of levels of transaminase and lactic acid dehydrogenase [LDH]: Secondary | ICD-10-CM | POA: Diagnosis present

## 2015-08-05 DIAGNOSIS — A419 Sepsis, unspecified organism: Principal | ICD-10-CM | POA: Diagnosis present

## 2015-08-05 DIAGNOSIS — F17210 Nicotine dependence, cigarettes, uncomplicated: Secondary | ICD-10-CM | POA: Diagnosis present

## 2015-08-05 DIAGNOSIS — Z79899 Other long term (current) drug therapy: Secondary | ICD-10-CM

## 2015-08-05 DIAGNOSIS — D62 Acute posthemorrhagic anemia: Secondary | ICD-10-CM | POA: Diagnosis not present

## 2015-08-05 DIAGNOSIS — D649 Anemia, unspecified: Secondary | ICD-10-CM | POA: Diagnosis not present

## 2015-08-05 DIAGNOSIS — R509 Fever, unspecified: Secondary | ICD-10-CM

## 2015-08-05 DIAGNOSIS — F1020 Alcohol dependence, uncomplicated: Secondary | ICD-10-CM | POA: Diagnosis present

## 2015-08-05 DIAGNOSIS — Y95 Nosocomial condition: Secondary | ICD-10-CM

## 2015-08-05 DIAGNOSIS — G40909 Epilepsy, unspecified, not intractable, without status epilepticus: Secondary | ICD-10-CM | POA: Diagnosis present

## 2015-08-05 DIAGNOSIS — R739 Hyperglycemia, unspecified: Secondary | ICD-10-CM | POA: Diagnosis present

## 2015-08-05 DIAGNOSIS — R945 Abnormal results of liver function studies: Secondary | ICD-10-CM

## 2015-08-05 DIAGNOSIS — L89154 Pressure ulcer of sacral region, stage 4: Secondary | ICD-10-CM

## 2015-08-05 DIAGNOSIS — S72001D Fracture of unspecified part of neck of right femur, subsequent encounter for closed fracture with routine healing: Secondary | ICD-10-CM

## 2015-08-05 DIAGNOSIS — S14109S Unspecified injury at unspecified level of cervical spinal cord, sequela: Secondary | ICD-10-CM | POA: Diagnosis not present

## 2015-08-05 DIAGNOSIS — L89159 Pressure ulcer of sacral region, unspecified stage: Secondary | ICD-10-CM

## 2015-08-05 DIAGNOSIS — S82891A Other fracture of right lower leg, initial encounter for closed fracture: Secondary | ICD-10-CM

## 2015-08-05 DIAGNOSIS — G9340 Encephalopathy, unspecified: Secondary | ICD-10-CM

## 2015-08-05 DIAGNOSIS — R6520 Severe sepsis without septic shock: Secondary | ICD-10-CM | POA: Diagnosis present

## 2015-08-05 DIAGNOSIS — Z6823 Body mass index (BMI) 23.0-23.9, adult: Secondary | ICD-10-CM | POA: Diagnosis not present

## 2015-08-05 DIAGNOSIS — T794XXA Traumatic shock, initial encounter: Secondary | ICD-10-CM

## 2015-08-05 DIAGNOSIS — N189 Chronic kidney disease, unspecified: Secondary | ICD-10-CM | POA: Diagnosis present

## 2015-08-05 DIAGNOSIS — N178 Other acute kidney failure: Secondary | ICD-10-CM | POA: Diagnosis not present

## 2015-08-05 DIAGNOSIS — Z720 Tobacco use: Secondary | ICD-10-CM

## 2015-08-05 DIAGNOSIS — S14109D Unspecified injury at unspecified level of cervical spinal cord, subsequent encounter: Secondary | ICD-10-CM | POA: Diagnosis not present

## 2015-08-05 DIAGNOSIS — T368X5A Adverse effect of other systemic antibiotics, initial encounter: Secondary | ICD-10-CM | POA: Diagnosis not present

## 2015-08-05 DIAGNOSIS — R7401 Elevation of levels of liver transaminase levels: Secondary | ICD-10-CM

## 2015-08-05 DIAGNOSIS — L8990 Pressure ulcer of unspecified site, unspecified stage: Secondary | ICD-10-CM

## 2015-08-05 LAB — I-STAT CG4 LACTIC ACID, ED
LACTIC ACID, VENOUS: 1.46 mmol/L (ref 0.5–2.0)
Lactic Acid, Venous: 2.84 mmol/L (ref 0.5–2.0)

## 2015-08-05 LAB — COMPREHENSIVE METABOLIC PANEL
ALK PHOS: 234 U/L — AB (ref 38–126)
ALT: 95 U/L — AB (ref 17–63)
AST: 65 U/L — ABNORMAL HIGH (ref 15–41)
Albumin: 1.7 g/dL — ABNORMAL LOW (ref 3.5–5.0)
Anion gap: 9 (ref 5–15)
BUN: <5 mg/dL — ABNORMAL LOW (ref 6–20)
CALCIUM: 8.3 mg/dL — AB (ref 8.9–10.3)
CO2: 25 mmol/L (ref 22–32)
CREATININE: 0.47 mg/dL — AB (ref 0.61–1.24)
Chloride: 98 mmol/L — ABNORMAL LOW (ref 101–111)
Glucose, Bld: 105 mg/dL — ABNORMAL HIGH (ref 65–99)
Potassium: 4 mmol/L (ref 3.5–5.1)
Sodium: 132 mmol/L — ABNORMAL LOW (ref 135–145)
Total Bilirubin: 0.2 mg/dL — ABNORMAL LOW (ref 0.3–1.2)
Total Protein: 6.3 g/dL — ABNORMAL LOW (ref 6.5–8.1)

## 2015-08-05 LAB — CBC WITH DIFFERENTIAL/PLATELET
BASOS PCT: 1 % (ref 0–1)
Basophils Absolute: 0.1 10*3/uL (ref 0.0–0.1)
EOS ABS: 0.1 10*3/uL (ref 0.0–0.7)
Eosinophils Relative: 2 % (ref 0–5)
HCT: 18.8 % — ABNORMAL LOW (ref 39.0–52.0)
HEMOGLOBIN: 5.9 g/dL — AB (ref 13.0–17.0)
Lymphocytes Relative: 16 % (ref 12–46)
Lymphs Abs: 1.5 10*3/uL (ref 0.7–4.0)
MCH: 25.1 pg — ABNORMAL LOW (ref 26.0–34.0)
MCHC: 31.4 g/dL (ref 30.0–36.0)
MCV: 80 fL (ref 78.0–100.0)
Monocytes Absolute: 0.9 10*3/uL (ref 0.1–1.0)
Monocytes Relative: 10 % (ref 3–12)
NEUTROS PCT: 72 % (ref 43–77)
Neutro Abs: 6.5 10*3/uL (ref 1.7–7.7)
Platelets: 613 10*3/uL — ABNORMAL HIGH (ref 150–400)
RBC: 2.35 MIL/uL — AB (ref 4.22–5.81)
RDW: 14.4 % (ref 11.5–15.5)
WBC: 9.1 10*3/uL (ref 4.0–10.5)

## 2015-08-05 LAB — MRSA PCR SCREENING: MRSA by PCR: NEGATIVE

## 2015-08-05 LAB — URINALYSIS, ROUTINE W REFLEX MICROSCOPIC
Bilirubin Urine: NEGATIVE
Glucose, UA: NEGATIVE mg/dL
HGB URINE DIPSTICK: NEGATIVE
Ketones, ur: NEGATIVE mg/dL
Nitrite: NEGATIVE
PROTEIN: NEGATIVE mg/dL
Specific Gravity, Urine: 1.002 — ABNORMAL LOW (ref 1.005–1.030)
UROBILINOGEN UA: 0.2 mg/dL (ref 0.0–1.0)
pH: 6.5 (ref 5.0–8.0)

## 2015-08-05 LAB — URINE MICROSCOPIC-ADD ON

## 2015-08-05 LAB — ABO/RH: ABO/RH(D): O POS

## 2015-08-05 LAB — PREPARE RBC (CROSSMATCH)

## 2015-08-05 MED ORDER — SODIUM CHLORIDE 0.9 % IJ SOLN
3.0000 mL | Freq: Two times a day (BID) | INTRAMUSCULAR | Status: DC
  Administered 2015-08-05 – 2015-08-20 (×17): 3 mL via INTRAVENOUS

## 2015-08-05 MED ORDER — ONDANSETRON HCL 4 MG PO TABS: 4.0000 mg | ORAL_TABLET | Freq: Four times a day (QID) | ORAL | Status: DC | PRN

## 2015-08-05 MED ORDER — TETANUS-DIPHTH-ACELL PERTUSSIS 5-2.5-18.5 LF-MCG/0.5 IM SUSP
0.5000 mL | Freq: Once | INTRAMUSCULAR | Status: AC
  Administered 2015-08-05: 0.5 mL via INTRAMUSCULAR
  Filled 2015-08-05: qty 0.5

## 2015-08-05 MED ORDER — VANCOMYCIN HCL 10 G IV SOLR
1500.0000 mg | Freq: Two times a day (BID) | INTRAVENOUS | Status: DC
  Administered 2015-08-06 – 2015-08-12 (×13): 1500 mg via INTRAVENOUS
  Filled 2015-08-05 (×14): qty 1500

## 2015-08-05 MED ORDER — VANCOMYCIN HCL IN DEXTROSE 1-5 GM/200ML-% IV SOLN
1000.0000 mg | Freq: Once | INTRAVENOUS | Status: AC
  Administered 2015-08-05: 1000 mg via INTRAVENOUS
  Filled 2015-08-05: qty 200

## 2015-08-05 MED ORDER — POLYETHYLENE GLYCOL 3350 17 G PO PACK
17.0000 g | PACK | Freq: Every day | ORAL | Status: DC
Start: 2015-08-06 — End: 2015-08-26
  Administered 2015-08-06 – 2015-08-25 (×15): 17 g via ORAL
  Filled 2015-08-05 (×20): qty 1

## 2015-08-05 MED ORDER — COLLAGENASE 250 UNIT/GM EX OINT
1.0000 "application " | TOPICAL_OINTMENT | Freq: Every day | CUTANEOUS | Status: DC
  Administered 2015-08-06 – 2015-08-07 (×2): 1 via TOPICAL
  Filled 2015-08-05: qty 30

## 2015-08-05 MED ORDER — OXYCODONE-ACETAMINOPHEN 5-325 MG PO TABS
1.0000 | ORAL_TABLET | ORAL | Status: DC | PRN
  Administered 2015-08-12 – 2015-08-26 (×11): 2 via ORAL
  Filled 2015-08-05 (×14): qty 2

## 2015-08-05 MED ORDER — SACCHAROMYCES BOULARDII 250 MG PO CAPS
250.0000 mg | ORAL_CAPSULE | Freq: Two times a day (BID) | ORAL | Status: DC
  Administered 2015-08-06 – 2015-08-26 (×40): 250 mg via ORAL
  Filled 2015-08-05 (×40): qty 1

## 2015-08-05 MED ORDER — PIPERACILLIN-TAZOBACTAM 3.375 G IVPB 30 MIN
3.3750 g | Freq: Once | INTRAVENOUS | Status: AC
  Administered 2015-08-05: 3.375 g via INTRAVENOUS
  Filled 2015-08-05: qty 50

## 2015-08-05 MED ORDER — VANCOMYCIN HCL 500 MG IV SOLR
500.0000 mg | Freq: Once | INTRAVENOUS | Status: AC
  Administered 2015-08-06: 500 mg via INTRAVENOUS
  Filled 2015-08-05 (×2): qty 500

## 2015-08-05 MED ORDER — ENOXAPARIN SODIUM 40 MG/0.4ML ~~LOC~~ SOLN
40.0000 mg | SUBCUTANEOUS | Status: DC
  Administered 2015-08-05 – 2015-08-13 (×9): 40 mg via SUBCUTANEOUS
  Filled 2015-08-05 (×9): qty 0.4

## 2015-08-05 MED ORDER — SODIUM CHLORIDE 0.9 % IV SOLN
INTRAVENOUS | Status: AC
  Administered 2015-08-05: via INTRAVENOUS

## 2015-08-05 MED ORDER — SODIUM CHLORIDE 0.9 % IV SOLN: 10.0000 mL/h | Freq: Once | INTRAVENOUS | Status: DC

## 2015-08-05 MED ORDER — SODIUM CHLORIDE 0.9 % IJ SOLN
10.0000 mL | INTRAMUSCULAR | Status: DC | PRN
Start: 2015-08-05 — End: 2015-08-26
  Administered 2015-08-05: 10 mL
  Administered 2015-08-06: 20 mL
  Administered 2015-08-06 – 2015-08-18 (×18): 10 mL
  Administered 2015-08-18: 20 mL
  Administered 2015-08-19 – 2015-08-24 (×5): 10 mL
  Filled 2015-08-05 (×26): qty 40

## 2015-08-05 MED ORDER — ADULT MULTIVITAMIN W/MINERALS CH
1.0000 | ORAL_TABLET | Freq: Every day | ORAL | Status: DC
  Administered 2015-08-06 – 2015-08-26 (×20): 1 via ORAL
  Filled 2015-08-05 (×21): qty 1

## 2015-08-05 MED ORDER — VANCOMYCIN HCL IN DEXTROSE 750-5 MG/150ML-% IV SOLN
750.0000 mg | Freq: Three times a day (TID) | INTRAVENOUS | Status: DC
  Filled 2015-08-05: qty 150

## 2015-08-05 MED ORDER — SODIUM CHLORIDE 0.9 % IV BOLUS (SEPSIS)
500.0000 mL | INTRAVENOUS | Status: AC
  Administered 2015-08-05: 500 mL via INTRAVENOUS

## 2015-08-05 MED ORDER — PIPERACILLIN-TAZOBACTAM 3.375 G IVPB
3.3750 g | Freq: Three times a day (TID) | INTRAVENOUS | Status: DC
  Administered 2015-08-06 – 2015-08-09 (×11): 3.375 g via INTRAVENOUS
  Filled 2015-08-05 (×13): qty 50

## 2015-08-05 MED ORDER — GABAPENTIN 300 MG PO CAPS
300.0000 mg | ORAL_CAPSULE | Freq: Three times a day (TID) | ORAL | Status: DC
  Administered 2015-08-05 – 2015-08-14 (×25): 300 mg via ORAL
  Filled 2015-08-05 (×26): qty 1

## 2015-08-05 MED ORDER — BISACODYL 10 MG RE SUPP: 10.0000 mg | Freq: Every day | RECTAL | Status: DC | PRN

## 2015-08-05 MED ORDER — ONDANSETRON HCL 4 MG/2ML IJ SOLN: 4.0000 mg | Freq: Four times a day (QID) | INTRAMUSCULAR | Status: DC | PRN

## 2015-08-05 MED ORDER — ACETAMINOPHEN 325 MG PO TABS
650.0000 mg | ORAL_TABLET | Freq: Once | ORAL | Status: AC
  Administered 2015-08-05: 650 mg via ORAL
  Filled 2015-08-05: qty 2

## 2015-08-05 MED ORDER — SODIUM CHLORIDE 0.9 % IV BOLUS (SEPSIS)
1000.0000 mL | INTRAVENOUS | Status: AC
  Administered 2015-08-05 (×2): 1000 mL via INTRAVENOUS

## 2015-08-05 MED ORDER — ALTEPLASE 2 MG IJ SOLR
2.0000 mg | Freq: Once | INTRAMUSCULAR | Status: AC
  Administered 2015-08-05: 2 mg
  Filled 2015-08-05: qty 2

## 2015-08-05 NOTE — ED Notes (Signed)
PA Bowie at the bedside. Debriding patient's wound.

## 2015-08-05 NOTE — ED Provider Notes (Signed)
Patient noted to have grapefruit presacral deep decubitus ulcer with necrotic material at base. Also noted to be markedly anemic and tachycardic. We'll require transfusion, intravenous antibiotics.  Orlie Dakin, MD 08/05/15 1850
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
## 2015-08-05 NOTE — ED Notes (Signed)
MD, primary RN, and PA made aware of Hgb
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
## 2015-08-05 NOTE — Progress Notes (Signed)
Patient arrived from ER to 5210 via stretcher no acute distress noted patient denies any pain at present time bed low an locked call light in reach

## 2015-08-05 NOTE — ED Notes (Signed)
Phlebotomy at the bedside  
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
## 2015-08-05 NOTE — ED Notes (Signed)
XRay to come verify patient's PICC Line before using.

## 2015-08-05 NOTE — ED Notes (Signed)
Per PTAR:  Pt here from [facility] with a sacral pressure ulcer that developed at his previous facility.  Pt was sent here by the MD at the facility to have his ulcer debrided and possible admission.

## 2015-08-05 NOTE — ED Notes (Signed)
ACTIVATED CODE SEPSIS @ 16:47

## 2015-08-06 ENCOUNTER — Encounter (HOSPITAL_COMMUNITY): Payer: Self-pay

## 2015-08-06 LAB — CBC WITH DIFFERENTIAL/PLATELET
Basophils Absolute: 0.1 10*3/uL (ref 0.0–0.1)
Basophils Relative: 1 % (ref 0–1)
Eosinophils Absolute: 0.3 10*3/uL (ref 0.0–0.7)
Eosinophils Relative: 3 % (ref 0–5)
HCT: 25.6 % — ABNORMAL LOW (ref 39.0–52.0)
Hemoglobin: 8.1 g/dL — ABNORMAL LOW (ref 13.0–17.0)
Lymphocytes Relative: 21 % (ref 12–46)
Lymphs Abs: 1.9 10*3/uL (ref 0.7–4.0)
MCH: 25.4 pg — ABNORMAL LOW (ref 26.0–34.0)
MCHC: 31.6 g/dL (ref 30.0–36.0)
MCV: 80.3 fL (ref 78.0–100.0)
Monocytes Absolute: 1.1 10*3/uL — ABNORMAL HIGH (ref 0.1–1.0)
Monocytes Relative: 13 % — ABNORMAL HIGH (ref 3–12)
Neutro Abs: 5.5 10*3/uL (ref 1.7–7.7)
Neutrophils Relative %: 62 % (ref 43–77)
Platelets: 655 10*3/uL — ABNORMAL HIGH (ref 150–400)
RBC: 3.19 MIL/uL — ABNORMAL LOW (ref 4.22–5.81)
RDW: 14 % (ref 11.5–15.5)
WBC: 8.8 10*3/uL (ref 4.0–10.5)

## 2015-08-06 LAB — URINE CULTURE: CULTURE: NO GROWTH

## 2015-08-06 LAB — COMPREHENSIVE METABOLIC PANEL
ALT: 72 U/L — ABNORMAL HIGH (ref 17–63)
AST: 46 U/L — ABNORMAL HIGH (ref 15–41)
Albumin: 1.6 g/dL — ABNORMAL LOW (ref 3.5–5.0)
Alkaline Phosphatase: 215 U/L — ABNORMAL HIGH (ref 38–126)
Anion gap: 8 (ref 5–15)
BUN: <5 mg/dL — ABNORMAL LOW (ref 6–20)
CO2: 25 mmol/L (ref 22–32)
Calcium: 8.2 mg/dL — ABNORMAL LOW (ref 8.9–10.3)
Chloride: 102 mmol/L (ref 101–111)
Creatinine, Ser: 0.44 mg/dL — ABNORMAL LOW (ref 0.61–1.24)
GFR calc Af Amer: >60 mL/min (ref >60)
GFR calc non Af Amer: >60 mL/min (ref >60)
Glucose, Bld: 109 mg/dL — ABNORMAL HIGH (ref 65–99)
Potassium: 3.6 mmol/L (ref 3.5–5.1)
Sodium: 135 mmol/L (ref 135–145)
Total Bilirubin: 0.7 mg/dL (ref 0.3–1.2)
Total Protein: 5.9 g/dL — ABNORMAL LOW (ref 6.5–8.1)

## 2015-08-06 LAB — IRON AND TIBC
Iron: 23 ug/dL — ABNORMAL LOW (ref 45–182)
Saturation Ratios: 19 % (ref 17.9–39.5)
TIBC: 119 ug/dL — AB (ref 250–450)
UIBC: 96 ug/dL

## 2015-08-06 LAB — MAGNESIUM: Magnesium: 1.7 mg/dL (ref 1.7–2.4)

## 2015-08-06 LAB — FERRITIN: Ferritin: 552 ng/mL — ABNORMAL HIGH (ref 24–336)

## 2015-08-06 LAB — RETICULOCYTES
RBC.: 3.23 MIL/uL — ABNORMAL LOW (ref 4.22–5.81)
Retic Count, Absolute: 38.8 10*3/uL (ref 19.0–186.0)
Retic Ct Pct: 1.2 % (ref 0.4–3.1)

## 2015-08-06 LAB — VITAMIN B12: Vitamin B-12: 726 pg/mL (ref 180–914)

## 2015-08-06 LAB — FOLATE: Folate: 19.6 ng/mL (ref >5.9)

## 2015-08-06 MED ORDER — DIPHENHYDRAMINE HCL 25 MG PO CAPS: 25.0000 mg | ORAL_CAPSULE | Freq: Once | ORAL | Status: DC | PRN

## 2015-08-06 MED ORDER — ZOLPIDEM TARTRATE 5 MG PO TABS
5.0000 mg | ORAL_TABLET | Freq: Every evening | ORAL | Status: DC | PRN
  Administered 2015-08-06 – 2015-08-25 (×17): 5 mg via ORAL
  Filled 2015-08-06 (×18): qty 1

## 2015-08-06 MED ORDER — FERROUS SULFATE 325 (65 FE) MG PO TABS
325.0000 mg | ORAL_TABLET | Freq: Two times a day (BID) | ORAL | Status: DC
  Administered 2015-08-06 – 2015-08-26 (×37): 325 mg via ORAL
  Filled 2015-08-06 (×40): qty 1

## 2015-08-06 NOTE — Progress Notes (Signed)
2nd unit of blood started

## 2015-08-06 NOTE — Progress Notes (Signed)
First unit of blood started.

## 2015-08-06 NOTE — Progress Notes (Signed)
Utilization review completed.  
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
## 2015-08-07 DIAGNOSIS — S14109D Unspecified injury at unspecified level of cervical spinal cord, subsequent encounter: Secondary | ICD-10-CM

## 2015-08-07 LAB — TYPE AND SCREEN
ABO/RH(D): O POS
ANTIBODY SCREEN: NEGATIVE
UNIT DIVISION: 0
UNIT DIVISION: 0

## 2015-08-07 LAB — COMPREHENSIVE METABOLIC PANEL
ALK PHOS: 207 U/L — AB (ref 38–126)
ALT: 63 U/L (ref 17–63)
ANION GAP: 8 (ref 5–15)
AST: 48 U/L — ABNORMAL HIGH (ref 15–41)
Albumin: 1.5 g/dL — ABNORMAL LOW (ref 3.5–5.0)
BUN: <5 mg/dL — ABNORMAL LOW (ref 6–20)
CALCIUM: 8.4 mg/dL — AB (ref 8.9–10.3)
CO2: 27 mmol/L (ref 22–32)
CREATININE: 0.49 mg/dL — AB (ref 0.61–1.24)
Chloride: 103 mmol/L (ref 101–111)
Glucose, Bld: 88 mg/dL (ref 65–99)
Potassium: 3.7 mmol/L (ref 3.5–5.1)
SODIUM: 138 mmol/L (ref 135–145)
TOTAL PROTEIN: 6 g/dL — AB (ref 6.5–8.1)
Total Bilirubin: 0.5 mg/dL (ref 0.3–1.2)

## 2015-08-07 LAB — CBC WITH DIFFERENTIAL/PLATELET
BASOS PCT: 0 % (ref 0–1)
Basophils Absolute: 0 10*3/uL (ref 0.0–0.1)
EOS ABS: 0.3 10*3/uL (ref 0.0–0.7)
EOS PCT: 3 % (ref 0–5)
HCT: 25.1 % — ABNORMAL LOW (ref 39.0–52.0)
HEMOGLOBIN: 8.1 g/dL — AB (ref 13.0–17.0)
Lymphocytes Relative: 28 % (ref 12–46)
Lymphs Abs: 2.5 10*3/uL (ref 0.7–4.0)
MCH: 26 pg (ref 26.0–34.0)
MCHC: 32.3 g/dL (ref 30.0–36.0)
MCV: 80.7 fL (ref 78.0–100.0)
MONO ABS: 1.1 10*3/uL — AB (ref 0.1–1.0)
Monocytes Relative: 13 % — ABNORMAL HIGH (ref 3–12)
NEUTROS ABS: 4.9 10*3/uL (ref 1.7–7.7)
NEUTROS PCT: 56 % (ref 43–77)
Platelets: 641 10*3/uL — ABNORMAL HIGH (ref 150–400)
RBC: 3.11 MIL/uL — ABNORMAL LOW (ref 4.22–5.81)
RDW: 14.4 % (ref 11.5–15.5)
WBC: 8.8 10*3/uL (ref 4.0–10.5)

## 2015-08-07 LAB — OCCULT BLOOD X 1 CARD TO LAB, STOOL: FECAL OCCULT BLD: NEGATIVE

## 2015-08-07 MED ORDER — COLLAGENASE 250 UNIT/GM EX OINT
TOPICAL_OINTMENT | Freq: Every day | CUTANEOUS | Status: DC
  Administered 2015-08-07: 18:00:00 via TOPICAL
  Administered 2015-08-08: 1 via TOPICAL
  Administered 2015-08-09 – 2015-08-11 (×3): via TOPICAL
  Administered 2015-08-12: 1 via TOPICAL
  Administered 2015-08-13 – 2015-08-14 (×2): via TOPICAL
  Administered 2015-08-15: 1 via TOPICAL
  Administered 2015-08-17 – 2015-08-24 (×7): via TOPICAL
  Filled 2015-08-07 (×2): qty 30

## 2015-08-07 NOTE — Consult Note (Signed)
WOC wound consult note Reason for Consult: Large, foul smelling pressure ulcer to sacrum Wound type:pressure Pressure Ulcer POA: Yes Measurement:Sacral:  17cm x 14cm x approximately 5cm with 30% black adherent necrotic tissue in the center of the wound bed.  Pockets are noted in the wound bed and this presentation is consistent with an infectious process. No bone is palpable  Unstageable.  Full thickness Unstageable pressure injury at the mid-back measuring 2cm x 1cm with 100% yellow slough base.  Full thickness Unstageable pressure injury on the left lateral LE measuring 4cm x 2cm with 100% black and yellow slough base.  Left lateral heel:  Unstageable pressure injury measuring 6cm x 2cm area of mushy, discolored tissue.  Left lateral malleolus:  2.5cm x 3cm with 100% necrotic tissue obscuring wound bed. Unstageable.  Right lateral heel:  Unstageable. 4cm x 3cm area of black tissue obscuring wound bed.  Wound bed:As described above. Drainage (amount, consistency, odor) No drainage from any of the tissue injuries other than the sacral wound and that wound has moderate to large amounts of serous drainage with a strong, foul odor consistent with necrotic tissue and infection. Periwound:Intact, dry. Dressing procedure/placement/frequency: I have ordered PT for hydrotherapy to the sacral wound and would also recommend consideration of a consultation to [facility]. Patient could be considered for a diverting ostomy as currently there is no bowel program and he is incontinence of stool.  I have today provided for a therapeutic mattress with low air loss feature.  Turning and repositioning with supine position avoided except for meals is in place. Prevalon boots are provided today for pressure redistribution to the heels and correction of lateral rotation of the feet that put the lateral malleoli at risk.  Patient would benefit form a nutritional consult.  If you agree, please order. [facility] nursing team will follow, and  will remain available to this patient, the nursing, surgical and medical teams.   Thanks, Maudie Flakes, MSN, RN, [place], [place], [place] [phone])

## 2015-08-07 NOTE — Clinical Social Work Note (Signed)
Clinical Social Work Assessment  Patient Details  Name: Phillip [name] MRN: [id number] Date of Birth: Oct 19, 1966  Date of referral:  08/06/15               Reason for consult:  Facility Placement                Permission sought to share information with:  Facility Sport and exercise psychologist, Family Supports Permission granted to share information::  Yes, Verbal Permission Granted  Name::     Ingvald Theisen  Agency::     Relationship::  sister  Contact Information:     Housing/Transportation Living arrangements for the past 2 months:  [place] (Patient has been at [employer] for the past 3 months and is okay with returning) Source of Information:  Patient Patient Interpreter Needed:  None Criminal Activity/Legal Involvement Pertinent to Current Situation/Hospitalization:  No - Comment as needed Significant Relationships:  Siblings Lives with:    Do you feel safe going back to the place where you live?  Yes Need for family participation in patient care:  No (Coment)  Care giving concerns:  Patient currently has no care concerns. Patient states that he had been at [employer] and plans to return for ongoing [place].    Social Worker assessment / plan:  CSW will facilitate transition back to [facility] once medically stable.   Employment status:  Disabled (Comment on whether or not currently receiving Disability) Insurance information:  Managed Care PT Recommendations:  Not assessed at this time Information / Referral to community resources:     Patient/Family's Response to care:  Patient agreeable to care and care plan.   Patient/Family's Understanding of and Emotional Response to Diagnosis, Current Treatment, and Prognosis:  Patient identified the expectation of needed treatment at [place] for improvement to return to independent living.   Emotional Assessment Appearance:  Appears stated age Attitude/Demeanor/Rapport:   (pleasant) Affect  (typically observed):  Accepting, Appropriate, Flat Orientation:  Oriented to Self, Oriented to Place, Oriented to  Time, Oriented to Situation Alcohol / Substance use:  Not Applicable Psych involvement (Current and /or in the community):  No (Comment)  Discharge Needs  Concerns to be addressed:  No discharge needs identified Readmission within the last 30 days:  No Current discharge risk:  None Barriers to Discharge:  No Barriers Identified   Christene Lye, LCSW 08/07/2015, 4:53 PM

## 2015-08-07 NOTE — Clinical Documentation Improvement (Signed)
Hospitalist  Can the diagnosis of pressure ulcer be further specified with Stage?   Document Stage of sacral pressure ulcer  Other  Clinically Undetermined  Supporting Information: -- "large sacral decubitus ulcer penetrating to the bone"  Please exercise your independent, professional judgment when responding. A specific answer is not anticipated or expected.   Thank You,  [place]
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
## 2015-08-08 LAB — COMPREHENSIVE METABOLIC PANEL
ALBUMIN: 1.7 g/dL — AB (ref 3.5–5.0)
ALT: 68 U/L — ABNORMAL HIGH (ref 17–63)
ANION GAP: 9 (ref 5–15)
AST: 46 U/L — ABNORMAL HIGH (ref 15–41)
Alkaline Phosphatase: 194 U/L — ABNORMAL HIGH (ref 38–126)
BILIRUBIN TOTAL: 0.5 mg/dL (ref 0.3–1.2)
BUN: <5 mg/dL — ABNORMAL LOW (ref 6–20)
CHLORIDE: 99 mmol/L — AB (ref 101–111)
CO2: 26 mmol/L (ref 22–32)
Calcium: 8.4 mg/dL — ABNORMAL LOW (ref 8.9–10.3)
Creatinine, Ser: 0.49 mg/dL — ABNORMAL LOW (ref 0.61–1.24)
GFR calc Af Amer: >60 mL/min (ref >60)
GFR calc non Af Amer: >60 mL/min (ref >60)
GLUCOSE: 108 mg/dL — AB (ref 65–99)
POTASSIUM: 3.7 mmol/L (ref 3.5–5.1)
Sodium: 134 mmol/L — ABNORMAL LOW (ref 135–145)
TOTAL PROTEIN: 6.1 g/dL — AB (ref 6.5–8.1)

## 2015-08-08 LAB — CBC WITH DIFFERENTIAL/PLATELET
BASOS PCT: 0 % (ref 0–1)
Basophils Absolute: 0 10*3/uL (ref 0.0–0.1)
EOS ABS: 0.4 10*3/uL (ref 0.0–0.7)
EOS PCT: 5 % (ref 0–5)
HEMATOCRIT: 24.9 % — AB (ref 39.0–52.0)
Hemoglobin: 7.9 g/dL — ABNORMAL LOW (ref 13.0–17.0)
Lymphocytes Relative: 23 % (ref 12–46)
Lymphs Abs: 2.2 10*3/uL (ref 0.7–4.0)
MCH: 25.4 pg — ABNORMAL LOW (ref 26.0–34.0)
MCHC: 31.7 g/dL (ref 30.0–36.0)
MCV: 80.1 fL (ref 78.0–100.0)
MONO ABS: 0.7 10*3/uL (ref 0.1–1.0)
MONOS PCT: 8 % (ref 3–12)
Neutro Abs: 6.2 10*3/uL (ref 1.7–7.7)
Neutrophils Relative %: 64 % (ref 43–77)
PLATELETS: 670 10*3/uL — AB (ref 150–400)
RBC: 3.11 MIL/uL — ABNORMAL LOW (ref 4.22–5.81)
RDW: 14.2 % (ref 11.5–15.5)
WBC: 9.5 10*3/uL (ref 4.0–10.5)

## 2015-08-08 LAB — VANCOMYCIN, TROUGH: VANCOMYCIN TR: 16 ug/mL (ref 10.0–20.0)

## 2015-08-08 MED ORDER — ENSURE ENLIVE PO LIQD
237.0000 mL | Freq: Two times a day (BID) | ORAL | Status: DC
  Administered 2015-08-09 – 2015-08-10 (×4): 237 mL via ORAL

## 2015-08-08 MED ORDER — PRO-STAT SUGAR FREE PO LIQD
30.0000 mL | Freq: Two times a day (BID) | ORAL | Status: DC
  Administered 2015-08-08: 30 mL via ORAL
  Filled 2015-08-08 (×4): qty 30
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
## 2015-08-08 NOTE — Progress Notes (Signed)
Patient ID: Phillip [name], male   DOB: Jul 06, 1966, 49 y.o.   MRN: [id number]   LOS: 3 days   Subjective: Surgery was asked to help with management of [place]'s decubitus ulcer. He is s/p quadriplegia from a [facility] in June of this year. He was readmitted in July secondary to sepsis likely caused by PNA and was noted to have a small sacral ulcer at that time. He returned to SNF and was readmitted this month with worsening of his ulcer. It is large, deep, and malodorous. The patient is insensate. He is chronically incontinent of stool.   Objective: Vital signs in last 24 hours: Temp:  [98.6 F (37 C)-99.3 F (37.4 C)] 99.3 F (37.4 C) (08/29 0539) Pulse Rate:  [110-112] 112 (08/29 0539) Resp:  [18-20] 20 (08/29 0539) BP: (132-149)/(87-96) 132/87 mmHg (08/29 0539) SpO2:  [100 %] 100 % (08/29 0539) Last BM Date: 08/06/15   Laboratory  CBC  Recent Labs  08/07/15 0448 08/08/15 0529  WBC 8.8 9.5  HGB 8.1* 7.9*  HCT 25.1* 24.9*  PLT 641* 670*   BMET  Recent Labs  08/07/15 0448 08/08/15 0529  NA 138 134*  K 3.7 3.7  CL 103 99*  CO2 27 26  GLUCOSE 88 108*  BUN <5* <5*  CREATININE 0.49* 0.49*  CALCIUM 8.4* 8.4*    Physical Exam     Second picture is post-debridement. Coccyx exposed in superior portion of wound.   Assessment/Plan: Sacral decubitus -- Agree with hydrotherapy. Recommend ID consult for osteomyelitis. Would recommend flexiseal for stool diversion. I discussed possibility of diverting colostomy; pt will consider. Will follow along with you.    Lisette Abu, PA-C Pager: [phone] General Trauma PA Pager: [phone]  08/08/2015
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
## 2015-08-08 NOTE — Progress Notes (Signed)
Physical Therapy Wound Treatment Patient Details  Name: Phillip [name] MRN: [id number] Date of Birth: 07-Feb-1966  Today's Date: 08/08/2015 Time: 1001-1033 Time Calculation (min): 32 min  Subjective  Subjective: No c/o's.  Patient and Family Stated Goals: Not stated Prior Treatments: Air overlay  Pain Score: Pain Score: 0-No pain  Wound Assessment  Pressure Ulcer 08/05/15 Unstageable - Full thickness tissue loss in which the base of the ulcer is covered by slough (yellow, tan, gray, green or brown) and/or eschar (tan, brown or black) in the wound bed. large wound with slough tissue noted to the mid (Active)  Dressing Type Moist to dry;Gauze (Comment);Barrier Film (skin prep);ABD 08/08/2015 10:05 AM  Dressing Changed 08/08/2015 10:05 AM  Dressing Change Frequency Twice a day 08/08/2015 10:05 AM  State of Healing Non-healing 08/08/2015 10:05 AM  Site / Wound Assessment Black;Brown;Yellow;Red 08/08/2015 10:05 AM  % Wound base Red or Granulating 70% 08/08/2015 10:05 AM  % Wound base Yellow 15% 08/08/2015 10:05 AM  % Wound base Black 15% 08/08/2015 10:05 AM  % Wound base Other (Comment) 0% 08/08/2015 10:05 AM  Peri-wound Assessment Intact 08/08/2015 10:05 AM  Wound Length (cm) 18.5 cm 08/08/2015 10:05 AM  Wound Width (cm) 13.5 cm 08/08/2015 10:05 AM  Wound Depth (cm) 3.5 cm 08/08/2015 10:05 AM  Tunneling (cm) 3 08/06/2015  8:58 AM  Margins Unattached edges (unapproximated) 08/08/2015 10:05 AM  Drainage Amount Moderate 08/08/2015 10:05 AM  Drainage Description Green;Odor 08/08/2015 10:05 AM  Treatment Debridement (Selective);Hydrotherapy (Pulse lavage);Packing (Saline gauze) 08/08/2015 10:05 AM   Santyl applied to wound bed prior to applying dressing.    Hydrotherapy Pulsed lavage therapy - wound location: sacrum Pulsed Lavage with Suction (psi): 8 psi Pulsed Lavage with Suction - Normal Saline Used: 1000 mL Pulsed Lavage Tip: Tip with splash shield Selective Debridement Selective Debridement -  Location: sacrum Selective Debridement - Tools Used: Forceps;Scissors Selective Debridement - Tissue Removed: black and yellow necrotic tissue   Wound Assessment and Plan  Wound Therapy - Assess/Plan/Recommendations Wound Therapy - Clinical Statement: Pt presents with large unstageable sacral  ulcer. Pt with blue green drainage today. Can benefit from hydrotherapy to debride necrotic tissue and promote wound healing.  Wound Therapy - Functional Problem List: Limited sitting due to ulcer Factors Delaying/Impairing Wound Healing: Incontinence;Infection - systemic/local;Immobility;Multiple medical problems;Polypharmacy;Tobacco use;Altered sensation Hydrotherapy Plan: Debridement;Dressing change;Patient/family education;Pulsatile lavage with suction Wound Therapy - Frequency: 6X / week Wound Therapy - Follow Up Recommendations: Skilled nursing facility Wound Plan: See above  Wound Therapy Goals- Improve the function of patient's integumentary system by progressing the wound(s) through the phases of wound healing (inflammation - proliferation - remodeling) by: Decrease Necrotic Tissue to: 10 Decrease Necrotic Tissue - Progress: Goal set today Increase Granulation Tissue to: 90 Increase Granulation Tissue - Progress: Goal set today Improve Drainage Characteristics: Serous Improve Drainage Characteristics - Progress: Goal set today  Goals will be updated until maximal potential achieved or discharge criteria met.  Discharge criteria: when goals achieved, discharge from hospital, MD decision/surgical intervention, no progress towards goals, refusal/missing three consecutive treatments without notification or medical reason.  GP     Soma Bachand 08/08/2015, 11:49 AM  Suanne Marker PT [phone]

## 2015-08-08 NOTE — Procedures (Signed)
Procedure: Sharp debridement using #15 blade of sacral decubitus 17x14cm  Surgeon: Silvestre Gunner, PA-C  Assist: None  Anesthesia: None (pt insensate)  EBL: Minimal  Complications: None  Procedure: Debridement of necrotic tissue with suture scissors and #15 blade undertaken for large sacral decubitus. Pt tolerated procedure well. Coccyx exposed under part of necrotic tissue.    Lisette Abu, PA-C Pager: [phone] General Trauma PA Pager: [phone]
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
## 2015-08-09 DIAGNOSIS — G8250 Quadriplegia, unspecified: Secondary | ICD-10-CM

## 2015-08-09 DIAGNOSIS — F17210 Nicotine dependence, cigarettes, uncomplicated: Secondary | ICD-10-CM

## 2015-08-09 DIAGNOSIS — M869 Osteomyelitis, unspecified: Secondary | ICD-10-CM | POA: Diagnosis present

## 2015-08-09 DIAGNOSIS — M4628 Osteomyelitis of vertebra, sacral and sacrococcygeal region: Secondary | ICD-10-CM

## 2015-08-09 LAB — COMPREHENSIVE METABOLIC PANEL
ALBUMIN: 1.8 g/dL — AB (ref 3.5–5.0)
ALT: 61 U/L (ref 17–63)
ANION GAP: 8 (ref 5–15)
AST: 42 U/L — AB (ref 15–41)
Alkaline Phosphatase: 175 U/L — ABNORMAL HIGH (ref 38–126)
BILIRUBIN TOTAL: 0.6 mg/dL (ref 0.3–1.2)
BUN: <5 mg/dL — ABNORMAL LOW (ref 6–20)
CHLORIDE: 101 mmol/L (ref 101–111)
CO2: 27 mmol/L (ref 22–32)
Calcium: 8.6 mg/dL — ABNORMAL LOW (ref 8.9–10.3)
Creatinine, Ser: 0.51 mg/dL — ABNORMAL LOW (ref 0.61–1.24)
GFR calc Af Amer: >60 mL/min (ref >60)
GFR calc non Af Amer: >60 mL/min (ref >60)
GLUCOSE: 89 mg/dL (ref 65–99)
POTASSIUM: 3.8 mmol/L (ref 3.5–5.1)
SODIUM: 136 mmol/L (ref 135–145)
TOTAL PROTEIN: 6.4 g/dL — AB (ref 6.5–8.1)

## 2015-08-09 LAB — CBC
HEMATOCRIT: 25.3 % — AB (ref 39.0–52.0)
Hemoglobin: 7.8 g/dL — ABNORMAL LOW (ref 13.0–17.0)
MCH: 25.3 pg — ABNORMAL LOW (ref 26.0–34.0)
MCHC: 30.8 g/dL (ref 30.0–36.0)
MCV: 82.1 fL (ref 78.0–100.0)
PLATELETS: 727 10*3/uL — AB (ref 150–400)
RBC: 3.08 MIL/uL — ABNORMAL LOW (ref 4.22–5.81)
RDW: 14.4 % (ref 11.5–15.5)
WBC: 8.1 10*3/uL (ref 4.0–10.5)

## 2015-08-09 MED ORDER — METRONIDAZOLE 500 MG PO TABS
500.0000 mg | ORAL_TABLET | Freq: Three times a day (TID) | ORAL | Status: DC
  Administered 2015-08-09 – 2015-08-11 (×6): 500 mg via ORAL
  Filled 2015-08-09 (×7): qty 1

## 2015-08-09 MED ORDER — SODIUM CHLORIDE 0.9 % IV BOLUS (SEPSIS)
500.0000 mL | Freq: Once | INTRAVENOUS | Status: AC
  Administered 2015-08-09: 500 mL via INTRAVENOUS

## 2015-08-09 MED ORDER — CIPROFLOXACIN HCL 500 MG PO TABS
750.0000 mg | ORAL_TABLET | Freq: Two times a day (BID) | ORAL | Status: DC
  Administered 2015-08-09 – 2015-08-11 (×5): 750 mg via ORAL
  Filled 2015-08-09 (×5): qty 2

## 2015-08-09 MED ORDER — ACETAMINOPHEN 325 MG PO TABS
650.0000 mg | ORAL_TABLET | Freq: Four times a day (QID) | ORAL | Status: DC | PRN
  Administered 2015-08-09 – 2015-08-20 (×4): 650 mg via ORAL
  Filled 2015-08-09 (×5): qty 2

## 2015-08-09 NOTE — Progress Notes (Signed)
Late Entry Note to morning treatment    08/09/15 1823  Subjective Assessment  Subjective No c/o's.   Patient and Family Stated Goals Not stated  Prior Treatments Air overlay  Evaluation and Treatment  Evaluation and Treatment Procedures Explained to Patient/Family Yes  Evaluation and Treatment Procedures agreed to  Pressure Ulcer 08/05/15 Unstageable - Full thickness tissue loss in which the base of the ulcer is covered by slough (yellow, tan, gray, green or brown) and/or eschar (tan, brown or black) in the wound bed. large wound with slough tissue noted to the mid  Date First Assessed/Time First Assessed: 08/05/15 2030   Location: Sacrum  Location Orientation: Left;Right  Staging: Unstageable - Full thickness tissue loss in which the base of the ulcer is covered by slough (yellow, tan, gray, green or brown) and/...  Dressing Type Moist to dry;Gauze (Comment);Barrier Film (skin prep);ABD (santly)  Dressing Changed  Dressing Change Frequency Twice a day  State of Healing Non-healing  Site / Wound Assessment Black;Brown;Yellow;Red  % Wound base Red or Granulating 70%  % Wound base Yellow 15%  % Wound base Black 15%  % Wound base Other (Comment) 0%  Peri-wound Assessment Intact  Margins Unattached edges (unapproximated)  Drainage Amount Moderate  Drainage Description Odor;Serous (recently changed and not much exudate)  Treatment Cleansed;Debridement (Selective);Hydrotherapy (Pulse lavage);Packing (Saline gauze) (Santyl)  Hydrotherapy  Pulsed lavage therapy - wound location sacrum  Pulsed Lavage with Suction (psi) 8 psi (4-12 psi)  Pulsed Lavage with Suction - Normal Saline Used 1000 mL  Pulsed Lavage Tip Tip with splash shield  Selective Debridement  Selective Debridement - Location sacrum  Selective Debridement - Tools Used Forceps;Scissors;Scalpel  Selective Debridement - Tissue Removed black and yellow necrotic tissue  Wound Therapy - Assess/Plan/Recommendations  Wound  Therapy - Clinical Statement Wound starting to look more healthy with large areas of granulation.  Debridement to continue.  Wound Therapy - Functional Problem List Limited sitting due to ulcer  Factors Delaying/Impairing Wound Healing Incontinence;Infection - systemic/local;Immobility;Multiple medical problems;Polypharmacy;Tobacco use;Altered sensation  Hydrotherapy Plan Debridement;Dressing change;Patient/family education;Pulsatile lavage with suction  Wound Therapy - Frequency 6X / week  Wound Therapy - Follow Up Recommendations Skilled nursing facility  Wound Plan See above  Wound Therapy Goals - Improve the function of patient's integumentary system by progressing the wound(s) through the phases of wound healing by:  Decrease Necrotic Tissue to 10  Decrease Necrotic Tissue - Progress Progressing toward goal  Increase Granulation Tissue to 90  Increase Granulation Tissue - Progress Progressing toward goal  Improve Drainage Characteristics Serous  Improve Drainage Characteristics - Progress Progressing toward goal  Time For Goal Achievement 7 days  Wound Therapy - Potential for Goals Good   08/09/2015  Donnella Sham, PT [phone]  (pager)
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
## 2015-08-09 NOTE — Progress Notes (Signed)
Pt's flexiseal was pushed out by stool just prior to hydrotherapy today. Pt's stool is not loose enough to flow through the flexiiseal tubing, so Flexiseal will not be effective. Tube left out.
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
## 2015-08-09 NOTE — Progress Notes (Signed)
Temp 102.9 po, text paged MD. Awaiting response. Will monitor.

## 2015-08-10 ENCOUNTER — Inpatient Hospital Stay (HOSPITAL_COMMUNITY): Payer: Medicaid Other

## 2015-08-10 DIAGNOSIS — R509 Fever, unspecified: Secondary | ICD-10-CM

## 2015-08-10 DIAGNOSIS — D649 Anemia, unspecified: Secondary | ICD-10-CM | POA: Insufficient documentation

## 2015-08-10 DIAGNOSIS — Z959 Presence of cardiac and vascular implant and graft, unspecified: Secondary | ICD-10-CM

## 2015-08-10 LAB — CBC
HEMATOCRIT: 25.7 % — AB (ref 39.0–52.0)
Hemoglobin: 7.9 g/dL — ABNORMAL LOW (ref 13.0–17.0)
MCH: 25.6 pg — AB (ref 26.0–34.0)
MCHC: 30.7 g/dL (ref 30.0–36.0)
MCV: 83.4 fL (ref 78.0–100.0)
Platelets: 759 10*3/uL — ABNORMAL HIGH (ref 150–400)
RBC: 3.08 MIL/uL — AB (ref 4.22–5.81)
RDW: 14.7 % (ref 11.5–15.5)
WBC: 9.9 10*3/uL (ref 4.0–10.5)

## 2015-08-10 LAB — BASIC METABOLIC PANEL
ANION GAP: 8 (ref 5–15)
CO2: 27 mmol/L (ref 22–32)
Calcium: 8.9 mg/dL (ref 8.9–10.3)
Chloride: 103 mmol/L (ref 101–111)
Creatinine, Ser: 0.43 mg/dL — ABNORMAL LOW (ref 0.61–1.24)
GFR calc Af Amer: >60 mL/min (ref >60)
GFR calc non Af Amer: >60 mL/min (ref >60)
GLUCOSE: 98 mg/dL (ref 65–99)
POTASSIUM: 3.7 mmol/L (ref 3.5–5.1)
Sodium: 138 mmol/L (ref 135–145)

## 2015-08-10 LAB — CULTURE, BLOOD (ROUTINE X 2): Culture: NO GROWTH
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
## 2015-08-10 NOTE — Plan of Care (Signed)
Problem: Phase II Progression Outcomes Goal: Obtain order to discontinue catheter if appropriate Outcome: Completed/Met Date Met:  08/10/15 Chronic foley

## 2015-08-10 NOTE — Clinical Social Work Note (Signed)
Clinical Social Worker continuing to follow patient and family for support and discharge planning needs.  Patient continues to state that he is from [facility] and plans to return at discharge.  Patient receiving hydrotherapy on an extensive wound (concern that facility was only turning patient once a shift - will be addressed prior to patient return).  Due to ongoing infection with patient sacral wound he is not yet medically stable for discharge.  CSW remains available for support and to facilitate patient discharge needs once medically ready.  Barbette Or, [place]

## 2015-08-10 NOTE — Progress Notes (Signed)
Physical Therapy Wound Treatment Patient Details  Name: Phillip [name] MRN: [id number] Date of Birth: Sep 18, 1966  Today's Date: 08/10/2015 Time: 1224-8250 Time Calculation (min): 37 min  Subjective  Subjective: No c/o's.  Patient and Family Stated Goals: Not stated Prior Treatments: Air overlay  Pain Score:  None  Wound Assessment  Pressure Ulcer 08/05/15 Unstageable - Full thickness tissue loss in which the base of the ulcer is covered by slough (yellow, tan, gray, green or brown) and/or eschar (tan, brown or black) in the wound bed. large wound with slough tissue noted to the mid (Active)  Dressing Type Moist to dry;Gauze (Comment);Barrier Film (skin prep);ABD 08/10/2015 11:36 AM  Dressing Changed 08/10/2015 11:36 AM  Dressing Change Frequency Twice a day 08/10/2015 11:36 AM  State of Healing Early/partial granulation 08/10/2015 11:36 AM  Site / Wound Assessment Black;Brown;Yellow;Red 08/10/2015 11:36 AM  % Wound base Red or Granulating 90% 08/10/2015 11:36 AM  % Wound base Yellow 5% 08/10/2015 11:36 AM  % Wound base Black 5% 08/10/2015 11:36 AM  % Wound base Other (Comment) 0% 08/09/2015  6:23 PM  Peri-wound Assessment Intact 08/10/2015 11:36 AM  Wound Length (cm) 18.5 cm 08/08/2015 10:05 AM  Wound Width (cm) 13.5 cm 08/08/2015 10:05 AM  Wound Depth (cm) 3.5 cm 08/08/2015 10:05 AM  Tunneling (cm) 3 08/06/2015  8:58 AM  Margins Unattached edges (unapproximated) 08/10/2015 11:36 AM  Drainage Amount Moderate 08/10/2015 11:36 AM  Drainage Description Odor;Green 08/10/2015 11:36 AM  Treatment Debridement (Selective);Hydrotherapy (Pulse lavage);Packing (Saline gauze) 08/10/2015 11:36 AM   Santyl applied to wound bed prior to applying dressing.    Hydrotherapy Pulsed lavage therapy - wound location: sacrum Pulsed Lavage with Suction (psi): 8 psi (4-8 psi) Pulsed Lavage with Suction - Normal Saline Used: 1000 mL Pulsed Lavage Tip: Tip with splash shield Selective Debridement Selective  Debridement - Location: sacrum Selective Debridement - Tools Used: Forceps;Scissors;Scalpel Selective Debridement - Tissue Removed: black and yellow necrotic tissue   Wound Assessment and Plan  Wound Therapy - Assess/Plan/Recommendations Wound Therapy - Clinical Statement: Wound continues to show improvement with increasing granulation tissue. Does have some blue green drainage. Wound Therapy - Functional Problem List: Limited sitting due to ulcer Factors Delaying/Impairing Wound Healing: Incontinence;Infection - systemic/local;Immobility;Multiple medical problems;Polypharmacy;Tobacco use;Altered sensation Hydrotherapy Plan: Debridement;Dressing change;Patient/family education;Pulsatile lavage with suction Wound Therapy - Frequency: 6X / week Wound Therapy - Follow Up Recommendations: Skilled nursing facility Wound Plan: See above  Wound Therapy Goals- Improve the function of patient's integumentary system by progressing the wound(s) through the phases of wound healing (inflammation - proliferation - remodeling) by: Decrease Necrotic Tissue to: 5 Decrease Necrotic Tissue - Progress: Updated due to goal met Increase Granulation Tissue to: 95 Increase Granulation Tissue - Progress: Updated due to goal met Improve Drainage Characteristics: Serous Improve Drainage Characteristics - Progress: Progressing toward goal Time For Goal Achievement: 7 days Wound Therapy - Potential for Goals: Good  Goals will be updated until maximal potential achieved or discharge criteria met.  Discharge criteria: when goals achieved, discharge from hospital, MD decision/surgical intervention, no progress towards goals, refusal/missing three consecutive treatments without notification or medical reason.  GP     Phillip [name] 08/10/2015, 11:44 AM Suanne Marker PT [phone]
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
## 2015-08-11 DIAGNOSIS — B965 Pseudomonas (aeruginosa) (mallei) (pseudomallei) as the cause of diseases classified elsewhere: Secondary | ICD-10-CM

## 2015-08-11 DIAGNOSIS — L89154 Pressure ulcer of sacral region, stage 4: Secondary | ICD-10-CM

## 2015-08-11 DIAGNOSIS — Z8619 Personal history of other infectious and parasitic diseases: Secondary | ICD-10-CM

## 2015-08-11 DIAGNOSIS — E43 Unspecified severe protein-calorie malnutrition: Secondary | ICD-10-CM

## 2015-08-11 LAB — WOUND CULTURE

## 2015-08-11 MED ORDER — ENSURE ENLIVE PO LIQD
237.0000 mL | Freq: Three times a day (TID) | ORAL | Status: DC
  Administered 2015-08-11 – 2015-08-22 (×21): 237 mL via ORAL

## 2015-08-11 MED ORDER — METOPROLOL TARTRATE 12.5 MG HALF TABLET
12.5000 mg | ORAL_TABLET | Freq: Two times a day (BID) | ORAL | Status: DC
  Administered 2015-08-11 – 2015-08-26 (×29): 12.5 mg via ORAL
  Filled 2015-08-11 (×32): qty 1

## 2015-08-11 MED ORDER — PIPERACILLIN-TAZOBACTAM 3.375 G IVPB
3.3750 g | Freq: Three times a day (TID) | INTRAVENOUS | Status: DC
  Administered 2015-08-11 – 2015-08-14 (×9): 3.375 g via INTRAVENOUS
  Filled 2015-08-11 (×11): qty 50
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
## 2015-08-11 NOTE — Progress Notes (Signed)
Physical Therapy Wound Treatment Patient Details  Name: Phillip [name] MRN: [id number] Date of Birth: [birth year]  Today's Date: 08/11/2015 Time: 7425-9563 Time Calculation (min): 37 min  Subjective  Subjective: No c/o's.  Patient and Family Stated Goals: Not stated Prior Treatments: Air overlay  Pain Score:   "I'm fine" no value given  Wound Assessment  Pressure Ulcer 08/05/15 Unstageable - Full thickness tissue loss in which the base of the ulcer is covered by slough (yellow, tan, gray, green or brown) and/or eschar (tan, brown or black) in the wound bed. large wound with slough tissue noted to the mid (Active)  Dressing Type Moist to dry;Gauze (Comment);Barrier Film (skin prep);ABD 08/11/2015 11:52 AM  Dressing Changed 08/11/2015 11:52 AM  Dressing Change Frequency Twice a day 08/11/2015 11:52 AM  State of Healing Early/partial granulation 08/11/2015 11:52 AM  Site / Wound Assessment Black;Brown;Yellow;Red 08/11/2015 11:52 AM  % Wound base Red or Granulating 90% 08/11/2015 11:52 AM  % Wound base Yellow 5% 08/11/2015 11:52 AM  % Wound base Black 5% 08/11/2015 11:52 AM  % Wound base Other (Comment) 0% 08/09/2015  6:23 PM  Peri-wound Assessment Intact 08/11/2015 11:52 AM  Wound Length (cm) 18.5 cm 08/08/2015 10:05 AM  Wound Width (cm) 13.5 cm 08/08/2015 10:05 AM  Wound Depth (cm) 3.5 cm 08/08/2015 10:05 AM  Tunneling (cm) 3 08/06/2015  8:58 AM  Margins Unattached edges (unapproximated) 08/11/2015 11:52 AM  Drainage Amount Copious 08/11/2015 11:52 AM  Drainage Description Odor;Green;Other (Comment) 08/11/2015 11:52 AM  Treatment Debridement (Selective);Hydrotherapy (Pulse lavage);Packing (Saline gauze);Other (Comment) 08/11/2015 11:52 AM   Santyl applied to wound bed prior to applying dressing.   Hydrotherapy Pulsed lavage therapy - wound location: sacrum Pulsed Lavage with Suction (psi): 8 psi (4-8 psi) Pulsed Lavage with Suction - Normal Saline Used: 1000 mL Pulsed Lavage Tip: Tip with splash  shield Selective Debridement Selective Debridement - Location: sacrum Selective Debridement - Tools Used: Forceps;Scissors;Scalpel Selective Debridement - Tissue Removed: black and yellow necrotic tissue   Wound Assessment and Plan  Wound Therapy - Assess/Plan/Recommendations Wound Therapy - Clinical Statement: Wound continues to show improvement with increasing granulation tissue. Does have some blue green drainage. More drainage noted today. Wound Therapy - Functional Problem List: Limited sitting due to ulcer Factors Delaying/Impairing Wound Healing: Incontinence;Infection - systemic/local;Immobility;Multiple medical problems;Polypharmacy;Tobacco use;Altered sensation Hydrotherapy Plan: Debridement;Dressing change;Patient/family education;Pulsatile lavage with suction Wound Therapy - Frequency: 6X / week Wound Therapy - Follow Up Recommendations: Skilled nursing facility Wound Plan: See above  Wound Therapy Goals- Improve the function of patient's integumentary system by progressing the wound(s) through the phases of wound healing (inflammation - proliferation - remodeling) by: Decrease Necrotic Tissue to: 5 Decrease Necrotic Tissue - Progress: Progressing toward goal Increase Granulation Tissue to: 95 Increase Granulation Tissue - Progress: Progressing toward goal Improve Drainage Characteristics: Serous Improve Drainage Characteristics - Progress: Progressing toward goal Time For Goal Achievement: 7 days Wound Therapy - Potential for Goals: Good  Goals will be updated until maximal potential achieved or discharge criteria met.  Discharge criteria: when goals achieved, discharge from hospital, MD decision/surgical intervention, no progress towards goals, refusal/missing three consecutive treatments without notification or medical reason.  GP     Candie Mile S 08/11/2015, 11:59 AM Elayne Snare, [place]
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
## 2015-08-12 ENCOUNTER — Encounter (HOSPITAL_COMMUNITY): Payer: Self-pay | Admitting: Plastic Surgery

## 2015-08-12 DIAGNOSIS — D649 Anemia, unspecified: Secondary | ICD-10-CM

## 2015-08-12 LAB — CBC
HCT: 27.7 % — ABNORMAL LOW (ref 39.0–52.0)
HEMOGLOBIN: 8.3 g/dL — AB (ref 13.0–17.0)
MCH: 25.2 pg — ABNORMAL LOW (ref 26.0–34.0)
MCHC: 30 g/dL (ref 30.0–36.0)
MCV: 83.9 fL (ref 78.0–100.0)
Platelets: 764 10*3/uL — ABNORMAL HIGH (ref 150–400)
RBC: 3.3 MIL/uL — ABNORMAL LOW (ref 4.22–5.81)
RDW: 15.2 % (ref 11.5–15.5)
WBC: 11.4 10*3/uL — ABNORMAL HIGH (ref 4.0–10.5)

## 2015-08-12 LAB — COMPREHENSIVE METABOLIC PANEL
ALBUMIN: 2.2 g/dL — AB (ref 3.5–5.0)
ALK PHOS: 115 U/L (ref 38–126)
ALT: 36 U/L (ref 17–63)
ANION GAP: 9 (ref 5–15)
AST: 24 U/L (ref 15–41)
BUN: 13 mg/dL (ref 6–20)
CO2: 25 mmol/L (ref 22–32)
Calcium: 9 mg/dL (ref 8.9–10.3)
Chloride: 103 mmol/L (ref 101–111)
Creatinine, Ser: 1.07 mg/dL (ref 0.61–1.24)
GFR calc Af Amer: >60 mL/min (ref >60)
GFR calc non Af Amer: >60 mL/min (ref >60)
GLUCOSE: 84 mg/dL (ref 65–99)
POTASSIUM: 4.3 mmol/L (ref 3.5–5.1)
SODIUM: 137 mmol/L (ref 135–145)
Total Bilirubin: 0.6 mg/dL (ref 0.3–1.2)
Total Protein: 6.3 g/dL — ABNORMAL LOW (ref 6.5–8.1)

## 2015-08-12 LAB — VANCOMYCIN, TROUGH: Vancomycin Tr: 66 ug/mL (ref 10.0–20.0)

## 2015-08-12 LAB — MAGNESIUM: Magnesium: 1.9 mg/dL (ref 1.7–2.4)
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
## 2015-08-12 NOTE — Progress Notes (Signed)
ANTIBIOTIC CONSULT NOTE - FOLLOW UP  Pharmacy Consult for Vancomycin and Zosyn Indication: Sepsis / Cellulitis / Wound infection  No Known Allergies  Patient Measurements: Height: 5\' 9"  (175.3 cm) Weight: 158 lb 8 oz (71.895 kg) IBW/kg (Calculated) : 70.7  Vital Signs: Temp: 98.6 F (37 C) (09/02 1417) BP: 141/88 mmHg (09/02 1417) Pulse Rate: 108 (09/02 1417) Intake/Output from previous day: 09/01 0701 - 09/02 0700 In: 720 [P.O.:720] Out: 1600 [Urine:1600] Intake/Output from this shift:    Labs:  Recent Labs  08/10/15 0605 08/12/15 0630  WBC 9.9 11.4*  HGB 7.9* 8.3*  PLT 759* 764*  CREATININE 0.43* 1.07   Estimated Creatinine Clearance: 86.3 mL/min (by C-G formula based on Cr of 1.07). No results for input(s): VANCOTROUGH, VANCOPEAK, VANCORANDOM, GENTTROUGH, GENTPEAK, GENTRANDOM, TOBRATROUGH, TOBRAPEAK, TOBRARND, AMIKACINPEAK, AMIKACINTROU, AMIKACIN in the last 72 hours.   Microbiology: Recent Results (from the past 720 hour(s))  Urine culture     Status: None   Collection Time: 08/05/15  5:23 PM  Result Value Ref Range Status   Specimen Description URINE, RANDOM  Final   Special Requests NONE  Final   Culture NO GROWTH 1 DAY  Final   Report Status 08/06/2015 FINAL  Final  Blood Culture (routine x 2)     Status: None (Preliminary result)   Collection Time: 08/05/15  5:34 PM  Result Value Ref Range Status   Specimen Description BLOOD RIGHT HAND  Final   Special Requests BOTTLES DRAWN AEROBIC AND ANAEROBIC 5CC  Final   Culture  Setup Time   Final    GRAM POSITIVE COCCI IN CLUSTERS AEROBIC BOTTLE ONLY CRITICAL RESULT CALLED TO, READ BACK BY AND VERIFIED WITH: V SMITH,RN AT 1640 08/07/15 BY L BENFIELD    Culture   Final    STAPHYLOCOCCUS SPECIES (COAGULASE NEGATIVE) THE SIGNIFICANCE OF ISOLATING THIS ORGANISM FROM A SINGLE SET OF BLOOD CULTURES WHEN MULTIPLE SETS ARE DRAWN IS UNCERTAIN. PLEASE NOTIFY THE MICROBIOLOGY DEPARTMENT WITHIN ONE WEEK IF SPECIATION AND  SENSITIVITIES ARE REQUIRED.    Report Status PENDING  Incomplete  Blood Culture (routine x 2)     Status: None   Collection Time: 08/05/15  5:40 PM  Result Value Ref Range Status   Specimen Description BLOOD LEFT HAND  Final   Special Requests BOTTLES DRAWN AEROBIC AND ANAEROBIC 5CC  Final   Culture NO GROWTH 5 DAYS  Final   Report Status 08/10/2015 FINAL  Final  MRSA PCR Screening     Status: None   Collection Time: 08/05/15 10:18 PM  Result Value Ref Range Status   MRSA by PCR NEGATIVE NEGATIVE Final    Comment:        The GeneXpert MRSA Assay (FDA approved for NASAL specimens only), is one component of a comprehensive MRSA colonization surveillance program. It is not intended to diagnose MRSA infection nor to guide or monitor treatment for MRSA infections.   Wound culture     Status: None   Collection Time: 08/07/15  7:25 PM  Result Value Ref Range Status   Specimen Description SACRAL  Final   Special Requests NONE  Final   Gram Stain   Final    RARE WBC PRESENT, PREDOMINANTLY PMN RARE SQUAMOUS EPITHELIAL CELLS PRESENT ABUNDANT GRAM NEGATIVE RODS FEW GRAM POSITIVE COCCI IN PAIRS Performed at [facility]    Culture   Final    ABUNDANT PSEUDOMONAS AERUGINOSA PROVIDENCIA STUARTII Performed at [facility]    Report Status 08/11/2015 FINAL  Final  Organism ID, Bacteria PSEUDOMONAS AERUGINOSA  Final   Organism ID, Bacteria PROVIDENCIA STUARTII  Final      Susceptibility   Pseudomonas aeruginosa - MIC*    CEFEPIME 2 SENSITIVE Sensitive     CEFTAZIDIME 4 SENSITIVE Sensitive     CIPROFLOXACIN 1 SENSITIVE Sensitive     GENTAMICIN <=1 SENSITIVE Sensitive     IMIPENEM <=0.25 SENSITIVE Sensitive     PIP/TAZO 8 SENSITIVE Sensitive     TOBRAMYCIN <=1 SENSITIVE Sensitive     * ABUNDANT PSEUDOMONAS AERUGINOSA   Providencia stuartii - MIC*    AMPICILLIN RESISTANT      AMPICILLIN/SULBACTAM 16 INTERMEDIATE Intermediate     CEFAZOLIN >=64 RESISTANT Resistant      CEFEPIME <=1 SENSITIVE Sensitive     CEFTAZIDIME <=1 SENSITIVE Sensitive     CEFTRIAXONE <=1 SENSITIVE Sensitive     CIPROFLOXACIN >=4 RESISTANT Resistant     GENTAMICIN RESISTANT      IMIPENEM 1 SENSITIVE Sensitive     PIP/TAZO <=4 SENSITIVE Sensitive     TOBRAMYCIN RESISTANT      TRIMETH/SULFA 160 RESISTANT Resistant     * PROVIDENCIA STUARTII  Culture, blood (routine x 2)     Status: None (Preliminary result)   Collection Time: 08/08/15  9:10 AM  Result Value Ref Range Status   Specimen Description BLOOD LEFT HAND  Final   Special Requests BOTTLES DRAWN AEROBIC AND ANAEROBIC 5CC  Final   Culture NO GROWTH 4 DAYS  Final   Report Status PENDING  Incomplete  Culture, blood (routine x 2)     Status: None (Preliminary result)   Collection Time: 08/08/15  9:18 AM  Result Value Ref Range Status   Specimen Description BLOOD RIGHT HAND  Final   Special Requests BOTTLES DRAWN AEROBIC AND ANAEROBIC 5CC  Final   Culture NO GROWTH 4 DAYS  Final   Report Status PENDING  Incomplete    Anti-infectives    Start     Dose/Rate Route Frequency Ordered Stop   08/11/15 1415  piperacillin-tazobactam (ZOSYN) IVPB 3.375 g     3.375 g 12.5 mL/hr over 240 Minutes Intravenous Every 8 hours 08/11/15 1404     08/09/15 1500  ciprofloxacin (CIPRO) tablet 750 mg  Status:  Discontinued     750 mg Oral Every 12 hours 08/09/15 1447 08/11/15 1353   08/09/15 1500  metroNIDAZOLE (FLAGYL) tablet 500 mg  Status:  Discontinued     500 mg Oral 3 times per day 08/09/15 1447 08/11/15 1353   08/06/15 0900  vancomycin (VANCOCIN) 1,500 mg in sodium chloride 0.9 % 500 mL IVPB     1,500 mg 250 mL/hr over 120 Minutes Intravenous Every 12 hours 08/05/15 2024     08/06/15 0200  vancomycin (VANCOCIN) IVPB 750 mg/150 ml premix  Status:  Discontinued     750 mg 150 mL/hr over 60 Minutes Intravenous Every 8 hours 08/05/15 1914 08/05/15 2011   08/06/15 0130  piperacillin-tazobactam (ZOSYN) IVPB 3.375 g  Status:  Discontinued      3.375 g 12.5 mL/hr over 240 Minutes Intravenous 3 times per day 08/05/15 1914 08/09/15 1447   08/05/15 2100  vancomycin (VANCOCIN) 500 mg in sodium chloride 0.9 % 100 mL IVPB     500 mg 100 mL/hr over 60 Minutes Intravenous  Once 08/05/15 2024 08/06/15 0153   08/05/15 1700  piperacillin-tazobactam (ZOSYN) IVPB 3.375 g     3.375 g 100 mL/hr over 30 Minutes Intravenous  Once 08/05/15 1645 08/05/15 1829  08/05/15 1700  vancomycin (VANCOCIN) IVPB 1000 mg/200 mL premix     1,000 mg 200 mL/hr over 60 Minutes Intravenous  Once 08/05/15 1645 08/05/15 1929      Assessment: 49yo quadriplegic male who was just here in July for [facility]CAP presents to the ED with sepsis 2/2 to his sacral pressure ulcer. Vancomycin trough 8/29was therapeutic at 16, but continued spiking low grade fevers. Recent wound culture with pseudomonas resistant to Cipro so changed back to Zosyn. Now day #4 (per ID) of abx with plan to continue therapy for 42 days. Pt is now afebrile, but had tmax of 100.4 yesterday. WBC elevated at 11.4. SCr rose to 1.07 from baseline of ~0.5 and UOP had decreased significantly but now back up to 2.47ml/kg/hr. VT today was drawn appropriately and came back supratherapeutic at 66.  Goal of Therapy:  Vancomycin trough level 15-20 mcg/ml  Resolution of infection  Plan:  HOLD current vancomycin Check VR on 9/4 with am labs Continue Zosyn 3.375 gm IV q8h (4 hour infusion) Monitor clinical picture, renal function, VT prn F/U C&S, abx deescalation / LOT  Reginia Naas 08/12/2015,9:08 PM
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
## 2015-08-12 NOTE — Progress Notes (Signed)
Physical Therapy Wound Treatment Patient Details  Name: GRYPHON VANDERVEEN MRN: [id number] Date of Birth: [birth year]  Today's Date: 08/12/2015 Time: 1010-1050 Time Calculation (min): 40 min  Subjective  Subjective: I'm doing fine Patient and Family Stated Goals: Not stated Prior Treatments: Air overlay  Pain Score: 0-No pain  Wound Assessment  Pressure Ulcer 08/05/15 Unstageable - Full thickness tissue loss in which the base of the ulcer is covered by slough (yellow, tan, gray, green or brown) and/or eschar (tan, brown or black) in the wound bed. large wound with slough tissue noted to the mid (Active)  Dressing Type Moist to dry;Gauze (Comment);Barrier Film (skin prep);ABD 08/12/2015 11:00 AM  Dressing Changed 08/12/2015 11:00 AM  Dressing Change Frequency Twice a day 08/12/2015 11:00 AM  State of Healing Early/partial granulation 08/12/2015 11:00 AM  Site / Wound Assessment Black;Brown;Yellow;Red 08/12/2015 11:00 AM  % Wound base Red or Granulating 90% 08/12/2015 11:00 AM  % Wound base Yellow 5% 08/12/2015 11:00 AM  % Wound base Black 5% 08/12/2015 11:00 AM  % Wound base Other (Comment) 0% 08/09/2015  6:23 PM  Peri-wound Assessment Intact 08/12/2015 11:00 AM  Wound Length (cm) 18.5 cm 08/08/2015 10:05 AM  Wound Width (cm) 13.5 cm 08/08/2015 10:05 AM  Wound Depth (cm) 3.5 cm 08/08/2015 10:05 AM  Tunneling (cm) 3 08/06/2015  8:58 AM  Margins Unattached edges (unapproximated) 08/12/2015 11:00 AM  Drainage Amount Copious 08/12/2015 11:00 AM  Drainage Description Odor;Green;Other (Comment);Serosanguineous 08/12/2015 11:00 AM  Treatment Debridement (Selective);Hydrotherapy (Pulse lavage);Packing (Saline gauze);Tape changed;Other (Comment) 08/12/2015 11:00 AM     Santyl applied to wound bed prior to applying dressing.;  Hydrotherapy Pulsed lavage therapy - wound location: sacrum Pulsed Lavage with Suction (psi): 8 psi (4-8 psi) Pulsed Lavage with Suction - Normal Saline Used: 1000 mL Pulsed Lavage Tip: Tip with  splash shield Selective Debridement Selective Debridement - Location: sacrum Selective Debridement - Tools Used: Forceps;Scissors;Scalpel Selective Debridement - Tissue Removed: black and yellow necrotic tissue - mostly at 1, 4 and 9 o'clock positions from anatomical position   Wound Assessment and Plan  Wound Therapy - Assess/Plan/Recommendations Wound Therapy - Clinical Statement: Wound continues to show improvement with increasing granulation tissue. Does have some blue green drainage. Able to remove more necrotic tissue. Wound Therapy - Functional Problem List: Limited sitting due to ulcer Factors Delaying/Impairing Wound Healing: Incontinence;Infection - systemic/local;Immobility;Multiple medical problems;Polypharmacy;Tobacco use;Altered sensation Hydrotherapy Plan: Debridement;Dressing change;Patient/family education;Pulsatile lavage with suction Wound Therapy - Frequency: 6X / week Wound Therapy - Follow Up Recommendations: Skilled nursing facility Wound Plan: See above  Wound Therapy Goals- Improve the function of patient's integumentary system by progressing the wound(s) through the phases of wound healing (inflammation - proliferation - remodeling) by: Decrease Necrotic Tissue to: 5 Decrease Necrotic Tissue - Progress: Progressing toward goal Increase Granulation Tissue to: 95 Increase Granulation Tissue - Progress: Progressing toward goal Improve Drainage Characteristics: Serous Improve Drainage Characteristics - Progress: Progressing toward goal Time For Goal Achievement: 7 days Wound Therapy - Potential for Goals: Good  Goals will be updated until maximal potential achieved or discharge criteria met.  Discharge criteria: when goals achieved, discharge from hospital, MD decision/surgical intervention, no progress towards goals, refusal/missing three consecutive treatments without notification or medical reason.  GP     Candie Mile S 08/12/2015, 11:34 AM  Elayne Snare, [place]

## 2015-08-13 LAB — CULTURE, BLOOD (ROUTINE X 2)
CULTURE: NO GROWTH
Culture: NO GROWTH

## 2015-08-13 LAB — URINALYSIS, ROUTINE W REFLEX MICROSCOPIC
BILIRUBIN URINE: NEGATIVE
GLUCOSE, UA: NEGATIVE mg/dL
HGB URINE DIPSTICK: NEGATIVE
Ketones, ur: NEGATIVE mg/dL
Nitrite: NEGATIVE
PROTEIN: NEGATIVE mg/dL
Specific Gravity, Urine: 1.007 (ref 1.005–1.030)
UROBILINOGEN UA: 0.2 mg/dL (ref 0.0–1.0)
pH: 6.5 (ref 5.0–8.0)

## 2015-08-13 LAB — BASIC METABOLIC PANEL
Anion gap: 10 (ref 5–15)
BUN: 23 mg/dL — AB (ref 6–20)
CHLORIDE: 99 mmol/L — AB (ref 101–111)
CO2: 27 mmol/L (ref 22–32)
Calcium: 8.9 mg/dL (ref 8.9–10.3)
Creatinine, Ser: 2.78 mg/dL — ABNORMAL HIGH (ref 0.61–1.24)
GFR calc Af Amer: 30 mL/min — ABNORMAL LOW (ref >60)
GFR, EST NON AFRICAN AMERICAN: 26 mL/min — AB (ref >60)
GLUCOSE: 86 mg/dL (ref 65–99)
POTASSIUM: 4.5 mmol/L (ref 3.5–5.1)
Sodium: 136 mmol/L (ref 135–145)

## 2015-08-13 LAB — CBC
HCT: 23.8 % — ABNORMAL LOW (ref 39.0–52.0)
Hemoglobin: 7.4 g/dL — ABNORMAL LOW (ref 13.0–17.0)
MCH: 26.1 pg (ref 26.0–34.0)
MCHC: 31.1 g/dL (ref 30.0–36.0)
MCV: 84.1 fL (ref 78.0–100.0)
PLATELETS: 669 10*3/uL — AB (ref 150–400)
RBC: 2.83 MIL/uL — AB (ref 4.22–5.81)
RDW: 14.9 % (ref 11.5–15.5)
WBC: 9.6 10*3/uL (ref 4.0–10.5)

## 2015-08-13 LAB — URINE MICROSCOPIC-ADD ON

## 2015-08-13 MED ORDER — VITAMIN C 500 MG PO TABS
500.0000 mg | ORAL_TABLET | Freq: Two times a day (BID) | ORAL | Status: DC
  Administered 2015-08-13 – 2015-08-26 (×25): 500 mg via ORAL
  Filled 2015-08-13 (×27): qty 1

## 2015-08-13 MED ORDER — ZINC SULFATE 220 (50 ZN) MG PO CAPS
220.0000 mg | ORAL_CAPSULE | Freq: Every day | ORAL | Status: DC
  Administered 2015-08-13 – 2015-08-26 (×13): 220 mg via ORAL
  Filled 2015-08-13 (×14): qty 1

## 2015-08-13 NOTE — Progress Notes (Signed)
CRITICAL VALUE ALERT  Critical value received: vancomycin trough 66  Date of notification:  08/11/18  Time of notification:  2141  Critical value read back:yes  Nurse who received alert:  Quinn Axe  MD notified (1st page):  Pharmacy called medication discontinued  Time of first page:  N/A  MD notified (2nd page):  Time of second page:  Responding MD:  N/A  Time MD responded:  Pharmacy discontinued

## 2015-08-13 NOTE — Progress Notes (Signed)
Physical Therapy Wound Treatment Patient Details  Name: Phillip [name] MRN: [id number] Date of Birth: 01/17/1966  Today's Date: 08/13/2015 Time: 1115-1200 Time Calculation (min): 45 min  Subjective  Subjective: How often do you do this treatment?  (explained to patient that we will do 6x/week)  Pain Score:    Wound Assessment  Pressure Ulcer 08/05/15 Unstageable - Full thickness tissue loss in which the base of the ulcer is covered by slough (yellow, tan, gray, green or brown) and/or eschar (tan, brown or black) in the wound bed. large wound with slough tissue noted to the mid (Active)  Dressing Type Moist to dry 08/13/2015 11:59 AM  Dressing Changed;Dry;Intact 08/13/2015 11:59 AM  Dressing Change Frequency Twice a day 08/13/2015 11:59 AM  State of Healing Early/partial granulation 08/13/2015 11:59 AM  Site / Wound Assessment Granulation tissue;Yellow 08/13/2015 11:59 AM  % Wound base Red or Granulating 90% 08/13/2015 11:59 AM  % Wound base Yellow 10% 08/13/2015 11:59 AM  % Wound base Black 0% 08/13/2015 11:59 AM  % Wound base Other (Comment) 0% 08/13/2015 11:59 AM  Peri-wound Assessment Intact 08/13/2015 11:59 AM  Wound Length (cm) 18.5 cm 08/08/2015 10:05 AM  Wound Width (cm) 13.5 cm 08/08/2015 10:05 AM  Wound Depth (cm) 3.5 cm 08/08/2015 10:05 AM  Tunneling (cm) 3 08/06/2015  8:58 AM  Margins Unattached edges (unapproximated) 08/13/2015 11:59 AM  Drainage Amount Minimal 08/13/2015 11:59 AM  Drainage Description Serosanguineous;No odor 08/13/2015 11:59 AM  Treatment Hydrotherapy (Pulse lavage);Packing (Saline gauze) 08/13/2015 11:59 AM     Santyl applied to wound bed prior to applying dressing.  Hydrotherapy Pulsed lavage therapy - wound location: sacrum Pulsed Lavage with Suction (psi): 8 psi Pulsed Lavage with Suction - Normal Saline Used: 1000 mL Pulsed Lavage Tip: Tip with splash shield   Wound Assessment and Plan  Wound Therapy - Assess/Plan/Recommendations Wound Therapy - Clinical Statement:  Wound continues to show improvement with increasing granulation tissue with minimal eschar present.  Feel patient close to ready for VAC dressing.   Wound Therapy - Functional Problem List: Limited sitting due to ulcer Factors Delaying/Impairing Wound Healing: Incontinence;Infection - systemic/local;Immobility;Multiple medical problems;Polypharmacy;Tobacco use;Altered sensation Hydrotherapy Plan: Dressing change;Patient/family education;Pulsatile lavage with suction Wound Therapy - Frequency: 6X / week Wound Therapy - Current Recommendations: WOC nurse Wound Therapy - Follow Up Recommendations: Skilled nursing facility Wound Plan: See above  Wound Therapy Goals- Improve the function of patient's integumentary system by progressing the wound(s) through the phases of wound healing (inflammation - proliferation - remodeling) by: Decrease Necrotic Tissue to: 5 Decrease Necrotic Tissue - Progress: Progressing toward goal Increase Granulation Tissue to: 95 Increase Granulation Tissue - Progress: Progressing toward goal  Goals will be updated until maximal potential achieved or discharge criteria met.  Discharge criteria: when goals achieved, discharge from hospital, MD decision/surgical intervention, no progress towards goals, refusal/missing three consecutive treatments without notification or medical reason.  GP     Shanna Cisco 08/13/2015, 12:11 PM  08/13/2015 [place], [employer]
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
## 2015-08-14 DIAGNOSIS — N178 Other acute kidney failure: Secondary | ICD-10-CM

## 2015-08-14 LAB — BASIC METABOLIC PANEL
Anion gap: 10 (ref 5–15)
BUN: 28 mg/dL — AB (ref 6–20)
CALCIUM: 8.5 mg/dL — AB (ref 8.9–10.3)
CHLORIDE: 97 mmol/L — AB (ref 101–111)
CO2: 26 mmol/L (ref 22–32)
CREATININE: 4.13 mg/dL — AB (ref 0.61–1.24)
GFR calc Af Amer: 18 mL/min — ABNORMAL LOW (ref >60)
GFR calc non Af Amer: 16 mL/min — ABNORMAL LOW (ref >60)
Glucose, Bld: 108 mg/dL — ABNORMAL HIGH (ref 65–99)
Potassium: 4.6 mmol/L (ref 3.5–5.1)
SODIUM: 133 mmol/L — AB (ref 135–145)

## 2015-08-14 LAB — CBC
HCT: 23 % — ABNORMAL LOW (ref 39.0–52.0)
Hemoglobin: 7 g/dL — ABNORMAL LOW (ref 13.0–17.0)
MCH: 25.1 pg — ABNORMAL LOW (ref 26.0–34.0)
MCHC: 30.4 g/dL (ref 30.0–36.0)
MCV: 82.4 fL (ref 78.0–100.0)
PLATELETS: 671 10*3/uL — AB (ref 150–400)
RBC: 2.79 MIL/uL — AB (ref 4.22–5.81)
RDW: 14.6 % (ref 11.5–15.5)
WBC: 9 10*3/uL (ref 4.0–10.5)

## 2015-08-14 LAB — PREALBUMIN: PREALBUMIN: 10.6 mg/dL — AB (ref 18–38)

## 2015-08-14 LAB — VANCOMYCIN, RANDOM: VANCOMYCIN RM: 56 ug/mL

## 2015-08-14 MED ORDER — PIPERACILLIN-TAZOBACTAM IN DEX 2-0.25 GM/50ML IV SOLN
2.2500 g | Freq: Three times a day (TID) | INTRAVENOUS | Status: DC
  Administered 2015-08-14 – 2015-08-15 (×4): 2.25 g via INTRAVENOUS
  Filled 2015-08-14 (×6): qty 50

## 2015-08-14 MED ORDER — ENOXAPARIN SODIUM 30 MG/0.3ML ~~LOC~~ SOLN
30.0000 mg | SUBCUTANEOUS | Status: DC
  Administered 2015-08-14 – 2015-08-21 (×7): 30 mg via SUBCUTANEOUS
  Filled 2015-08-14 (×8): qty 0.3

## 2015-08-14 MED ORDER — GABAPENTIN 100 MG PO CAPS
100.0000 mg | ORAL_CAPSULE | Freq: Three times a day (TID) | ORAL | Status: DC
  Administered 2015-08-14 – 2015-08-26 (×35): 100 mg via ORAL
  Filled 2015-08-14 (×36): qty 1

## 2015-08-14 NOTE — Progress Notes (Signed)
ANTIBIOTIC CONSULT NOTE - FOLLOW UP  Pharmacy Consult for Vancomycin and Zosyn Indication: Sepsis / Cellulitis / Wound infection  No Known Allergies  Patient Measurements: Height: 5\' 9"  (175.3 cm) Weight: 158 lb 8 oz (71.895 kg) IBW/kg (Calculated) : 70.7  Vital Signs: Temp: 99.5 F (37.5 C) (09/04 0552) Temp Source: Oral (09/04 0552) BP: 140/80 mmHg (09/04 0954) Pulse Rate: 109 (09/04 0954) Intake/Output from previous day: 09/03 0701 - 09/04 0700 In: 504 [P.O.:354; IV Piggyback:150] Out: 1300 [Urine:1300] Intake/Output from this shift: Total I/O In: 360 [P.O.:360] Out: -   Labs:  Recent Labs  08/12/15 0630 08/13/15 0505 08/14/15 0520  WBC 11.4* 9.6 9.0  HGB 8.3* 7.4* 7.0*  PLT 764* 669* 671*  CREATININE 1.07 2.78* 4.13*   Estimated Creatinine Clearance: 22.3 mL/min (by C-G formula based on Cr of 4.13).  Recent Labs  08/12/15 2030 08/14/15 0520  VANCOTROUGH 67*  --   VANCORANDOM  --  85     Microbiology: Recent Results (from the past 720 hour(s))  Urine culture     Status: None   Collection Time: 08/05/15  5:23 PM  Result Value Ref Range Status   Specimen Description URINE, RANDOM  Final   Special Requests NONE  Final   Culture NO GROWTH 1 DAY  Final   Report Status 08/06/2015 FINAL  Final  Blood Culture (routine x 2)     Status: None   Collection Time: 08/05/15  5:34 PM  Result Value Ref Range Status   Specimen Description BLOOD RIGHT HAND  Final   Special Requests BOTTLES DRAWN AEROBIC AND ANAEROBIC 5CC  Final   Culture  Setup Time   Final    GRAM POSITIVE COCCI IN CLUSTERS AEROBIC BOTTLE ONLY CRITICAL RESULT CALLED TO, READ BACK BY AND VERIFIED WITH: V SMITH,RN AT 1640 08/07/15 BY L BENFIELD    Culture   Final    STAPHYLOCOCCUS SPECIES (COAGULASE NEGATIVE) THE SIGNIFICANCE OF ISOLATING THIS ORGANISM FROM A SINGLE SET OF BLOOD CULTURES WHEN MULTIPLE SETS ARE DRAWN IS UNCERTAIN. PLEASE NOTIFY THE MICROBIOLOGY DEPARTMENT WITHIN ONE WEEK IF  SPECIATION AND SENSITIVITIES ARE REQUIRED.    Report Status 08/13/2015 FINAL  Final  Blood Culture (routine x 2)     Status: None   Collection Time: 08/05/15  5:40 PM  Result Value Ref Range Status   Specimen Description BLOOD LEFT HAND  Final   Special Requests BOTTLES DRAWN AEROBIC AND ANAEROBIC 5CC  Final   Culture NO GROWTH 5 DAYS  Final   Report Status 08/10/2015 FINAL  Final  MRSA PCR Screening     Status: None   Collection Time: 08/05/15 10:18 PM  Result Value Ref Range Status   MRSA by PCR NEGATIVE NEGATIVE Final    Comment:        The GeneXpert MRSA Assay (FDA approved for NASAL specimens only), is one component of a comprehensive MRSA colonization surveillance program. It is not intended to diagnose MRSA infection nor to guide or monitor treatment for MRSA infections.   Wound culture     Status: None   Collection Time: 08/07/15  7:25 PM  Result Value Ref Range Status   Specimen Description SACRAL  Final   Special Requests NONE  Final   Gram Stain   Final    RARE WBC PRESENT, PREDOMINANTLY PMN RARE SQUAMOUS EPITHELIAL CELLS PRESENT ABUNDANT GRAM NEGATIVE RODS FEW GRAM POSITIVE COCCI IN PAIRS Performed at [facility]    Culture   Final  ABUNDANT PSEUDOMONAS AERUGINOSA PROVIDENCIA STUARTII Performed at [facility]    Report Status 08/11/2015 FINAL  Final   Organism ID, Bacteria PSEUDOMONAS AERUGINOSA  Final   Organism ID, Bacteria PROVIDENCIA STUARTII  Final      Susceptibility   Pseudomonas aeruginosa - MIC*    CEFEPIME 2 SENSITIVE Sensitive     CEFTAZIDIME 4 SENSITIVE Sensitive     CIPROFLOXACIN 1 SENSITIVE Sensitive     GENTAMICIN <=1 SENSITIVE Sensitive     IMIPENEM <=0.25 SENSITIVE Sensitive     PIP/TAZO 8 SENSITIVE Sensitive     TOBRAMYCIN <=1 SENSITIVE Sensitive     * ABUNDANT PSEUDOMONAS AERUGINOSA   Providencia stuartii - MIC*    AMPICILLIN RESISTANT      AMPICILLIN/SULBACTAM 16 INTERMEDIATE Intermediate     CEFAZOLIN >=64  RESISTANT Resistant     CEFEPIME <=1 SENSITIVE Sensitive     CEFTAZIDIME <=1 SENSITIVE Sensitive     CEFTRIAXONE <=1 SENSITIVE Sensitive     CIPROFLOXACIN >=4 RESISTANT Resistant     GENTAMICIN RESISTANT      IMIPENEM 1 SENSITIVE Sensitive     PIP/TAZO <=4 SENSITIVE Sensitive     TOBRAMYCIN RESISTANT      TRIMETH/SULFA 160 RESISTANT Resistant     * PROVIDENCIA STUARTII  Culture, blood (routine x 2)     Status: None   Collection Time: 08/08/15  9:10 AM  Result Value Ref Range Status   Specimen Description BLOOD LEFT HAND  Final   Special Requests BOTTLES DRAWN AEROBIC AND ANAEROBIC 5CC  Final   Culture NO GROWTH 5 DAYS  Final   Report Status 08/13/2015 FINAL  Final  Culture, blood (routine x 2)     Status: None   Collection Time: 08/08/15  9:18 AM  Result Value Ref Range Status   Specimen Description BLOOD RIGHT HAND  Final   Special Requests BOTTLES DRAWN AEROBIC AND ANAEROBIC 5CC  Final   Culture NO GROWTH 5 DAYS  Final   Report Status 08/13/2015 FINAL  Final    Anti-infectives    Start     Dose/Rate Route Frequency Ordered Stop   08/14/15 1415  piperacillin-tazobactam (ZOSYN) IVPB 2.25 g     2.25 g 100 mL/hr over 30 Minutes Intravenous Every 8 hours 08/14/15 1000     08/11/15 1415  piperacillin-tazobactam (ZOSYN) IVPB 3.375 g  Status:  Discontinued     3.375 g 12.5 mL/hr over 240 Minutes Intravenous Every 8 hours 08/11/15 1404 08/14/15 1000   08/09/15 1500  ciprofloxacin (CIPRO) tablet 750 mg  Status:  Discontinued     750 mg Oral Every 12 hours 08/09/15 1447 08/11/15 1353   08/09/15 1500  metroNIDAZOLE (FLAGYL) tablet 500 mg  Status:  Discontinued     500 mg Oral 3 times per day 08/09/15 1447 08/11/15 1353   08/06/15 0900  vancomycin (VANCOCIN) 1,500 mg in sodium chloride 0.9 % 500 mL IVPB  Status:  Discontinued     1,500 mg 250 mL/hr over 120 Minutes Intravenous Every 12 hours 08/05/15 2024 08/12/15 2142   08/06/15 0200  vancomycin (VANCOCIN) IVPB 750 mg/150 ml premix   Status:  Discontinued     750 mg 150 mL/hr over 60 Minutes Intravenous Every 8 hours 08/05/15 1914 08/05/15 2011   08/06/15 0130  piperacillin-tazobactam (ZOSYN) IVPB 3.375 g  Status:  Discontinued     3.375 g 12.5 mL/hr over 240 Minutes Intravenous 3 times per day 08/05/15 1914 08/09/15 1447   08/05/15 2100  vancomycin (VANCOCIN) 500  mg in sodium chloride 0.9 % 100 mL IVPB     500 mg 100 mL/hr over 60 Minutes Intravenous  Once 08/05/15 2024 08/06/15 0153   08/05/15 1700  piperacillin-tazobactam (ZOSYN) IVPB 3.375 g     3.375 g 100 mL/hr over 30 Minutes Intravenous  Once 08/05/15 1645 08/05/15 1829   08/05/15 1700  vancomycin (VANCOCIN) IVPB 1000 mg/200 mL premix     1,000 mg 200 mL/hr over 60 Minutes Intravenous  Once 08/05/15 1645 08/05/15 1929      Assessment: 49yo quadriplegic male who was just here in July for HCAP presents to the ED with sepsis 2/2 to his sacral pressure ulcer. Vancomycin trough 8/29was therapeutic at 16, but continued spiking low grade fevers. Recent wound culture with pseudomonas resistant to Cipro so changed back to Zosyn. Now day #4 (per ID) of abx with plan to continue therapy for 42 days. Pt is now afebrile, but had tmax of 100.4 yesterday. WBC elevated at 11.4. SCr rose to 1.07 from baseline of ~0.5 and UOP had decreased significantly but now back up to 2.97ml/kg/hr. VT on 9/2 was drawn appropriately and came back supratherapeutic at 66. VR drawn today still supratherapeutic at 56 with a large jump in SCr from 2.67 to 4.13. Calculated vancomycin half-life >100 hours. Zosyn dose also decreased due to worsening renal function.  Goal of Therapy:  Vancomycin trough level 15-20 mcg/ml  Resolution of infection  Plan:  HOLD current vancomycin Check VR on 9/7 with am labs Zosyn 2.25 gm IV q8h per MD Monitor clinical picture, renal function, VT prn F/U C&S, abx deescalation / LOT  Judieth Keens, PharmD. PGY1 Resident Pager [phone] 08/14/2015,1:58 PM
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
## 2015-08-15 LAB — CBC
HEMATOCRIT: 23.5 % — AB (ref 39.0–52.0)
Hemoglobin: 7.1 g/dL — ABNORMAL LOW (ref 13.0–17.0)
MCH: 24.7 pg — ABNORMAL LOW (ref 26.0–34.0)
MCHC: 30.2 g/dL (ref 30.0–36.0)
MCV: 81.9 fL (ref 78.0–100.0)
Platelets: 664 10*3/uL — ABNORMAL HIGH (ref 150–400)
RBC: 2.87 MIL/uL — AB (ref 4.22–5.81)
RDW: 14.5 % (ref 11.5–15.5)
WBC: 9.1 10*3/uL (ref 4.0–10.5)

## 2015-08-15 LAB — BASIC METABOLIC PANEL
ANION GAP: 11 (ref 5–15)
BUN: 32 mg/dL — ABNORMAL HIGH (ref 6–20)
CHLORIDE: 99 mmol/L — AB (ref 101–111)
CO2: 27 mmol/L (ref 22–32)
Calcium: 8.9 mg/dL (ref 8.9–10.3)
Creatinine, Ser: 5.11 mg/dL — ABNORMAL HIGH (ref 0.61–1.24)
GFR calc Af Amer: 14 mL/min — ABNORMAL LOW (ref >60)
GFR, EST NON AFRICAN AMERICAN: 12 mL/min — AB (ref >60)
GLUCOSE: 89 mg/dL (ref 65–99)
POTASSIUM: 5.1 mmol/L (ref 3.5–5.1)
Sodium: 137 mmol/L (ref 135–145)
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
## 2015-08-15 NOTE — Clinical Social Work Note (Signed)
CSW informed by the Weekend CSW that the pt is now uninsurance. CSW left a voice message for [facility] Admission Coordinator [place] to verify the pt's insurance coverage and to see if they will accept the pt back. CSW faxed the pt's clinicals to [employer].   [place], MSW, [place]

## 2015-08-15 NOTE — Progress Notes (Signed)
Physical Therapy Wound Treatment Patient Details  Name: JASRAJ LAPPE MRN: [id number] Date of Birth: 24-Jan-1966  Today's Date: 08/15/2015 Time: 7867-5449 Time Calculation (min): 39 min  Subjective  Subjective: Why am I not getting therapy to work on my arms? (Informed pt OT Consult placed today) Discussed usual role of PT being seating/positioning in w/c and cannot sit up currently due to wound Patient and Family Stated Goals: Not stated Prior Treatments: Air overlay  Pain Score:    Wound Assessment  Pressure Ulcer 08/05/15 Unstageable - Full thickness tissue loss in which the base of the ulcer is covered by slough (yellow, tan, gray, green or brown) and/or eschar (tan, brown or black) in the wound bed. large wound with slough tissue noted to the mid (Active)  Dressing Type Gauze (Comment);Barrier Film (skin prep);ABD;Moist to moist 08/15/2015 11:20 AM  Dressing Changed;Clean;Dry;Intact 08/15/2015 11:20 AM  Dressing Change Frequency Twice a day 08/15/2015 11:20 AM  State of Healing Early/partial granulation 08/15/2015 11:20 AM  Site / Wound Assessment Yellow;Red;Granulation tissue;Other (Comment) 08/15/2015 11:20 AM  % Wound base Red or Granulating 90% 08/15/2015 11:20 AM  % Wound base Yellow 5% 08/15/2015 11:20 AM  % Wound base Black 0% 08/15/2015 11:20 AM  % Wound base Other (Comment) 5% 08/15/2015 11:20 AM  Peri-wound Assessment Intact 08/15/2015 11:20 AM  Wound Length (cm) 16 cm 08/15/2015 11:20 AM  Wound Width (cm) 15.5 cm 08/15/2015 11:20 AM  Wound Depth (cm) 3.2 cm 08/15/2015 11:20 AM  Tunneling (cm) 3 08/06/2015  8:58 AM  Margins Unattached edges (unapproximated) 08/15/2015 11:20 AM  Drainage Amount Copious 08/15/2015 11:20 AM  Drainage Description Serosanguineous 08/15/2015 11:20 AM  Treatment Debridement (Selective);Hydrotherapy (Pulse lavage) 08/15/2015 11:20 AM   Santyl applied to wound bed prior to applying dressing.    Hydrotherapy Pulsed lavage therapy - wound location: sacrum Pulsed Lavage  with Suction (psi): 8 psi (4-8 psi) Pulsed Lavage with Suction - Normal Saline Used: 1000 mL Pulsed Lavage Tip: Tip with splash shield Selective Debridement Selective Debridement - Location: sacrum Selective Debridement - Tools Used: Forceps;Scissors Selective Debridement - Tissue Removed: yellow necrotic tissue/slough - mostly at 1, 4 and 9 o'clock positions from anatomical position   Wound Assessment and Plan  Wound Therapy - Assess/Plan/Recommendations Wound Therapy - Clinical Statement: Wound progressing well. Only options for %yellow, red, etc in 5% increments; more accurately likely 7% yellow and 3% white connective tissue. Anticipate ready for next phase (VAC vs flap) Wound Therapy - Functional Problem List: Limited sitting due to ulcer Factors Delaying/Impairing Wound Healing: Incontinence;Infection - systemic/local;Immobility;Multiple medical problems;Polypharmacy;Tobacco use;Altered sensation Hydrotherapy Plan: Debridement;Dressing change;Patient/family education;Pulsatile lavage with suction Wound Therapy - Frequency: 6X / week Wound Therapy - Current Recommendations: WOC nurse (between plastics visits) Wound Therapy - Follow Up Recommendations: Skilled nursing facility Wound Plan: See above  Wound Therapy Goals- Improve the function of patient's integumentary system by progressing the wound(s) through the phases of wound healing (inflammation - proliferation - remodeling) by: Decrease Necrotic Tissue to: 0 Decrease Necrotic Tissue - Progress: Goal set today Increase Granulation Tissue to: 95 (other 5% white connective tissue) Increase Granulation Tissue - Progress: Goal set today Improve Drainage Characteristics: Serous;Mod Improve Drainage Characteristics - Progress: Goal set today Goals/treatment plan/discharge plan were made with and agreed upon by patient/family: Yes Time For Goal Achievement: 7 days (next due 9/12) Wound Therapy - Potential for Goals: Good  Goals will  be updated until maximal potential achieved or discharge criteria met.  Discharge criteria: when goals achieved, discharge  from hospital, MD decision/surgical intervention, no progress towards goals, refusal/missing three consecutive treatments without notification or medical reason.  GP     Moo Gravley 08/15/2015, 11:32 AM Pager [phone]
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
## 2015-08-16 DIAGNOSIS — N179 Acute kidney failure, unspecified: Secondary | ICD-10-CM

## 2015-08-16 LAB — BASIC METABOLIC PANEL
ANION GAP: 11 (ref 5–15)
BUN: 34 mg/dL — ABNORMAL HIGH (ref 6–20)
CO2: 26 mmol/L (ref 22–32)
Calcium: 9.3 mg/dL (ref 8.9–10.3)
Chloride: 98 mmol/L — ABNORMAL LOW (ref 101–111)
Creatinine, Ser: 5.56 mg/dL — ABNORMAL HIGH (ref 0.61–1.24)
GFR calc non Af Amer: 11 mL/min — ABNORMAL LOW (ref >60)
GFR, EST AFRICAN AMERICAN: 13 mL/min — AB (ref >60)
GLUCOSE: 83 mg/dL (ref 65–99)
POTASSIUM: 5.2 mmol/L — AB (ref 3.5–5.1)
Sodium: 135 mmol/L (ref 135–145)

## 2015-08-16 MED ORDER — SODIUM CHLORIDE 0.9 % IV SOLN
125.0000 mg | Freq: Every day | INTRAVENOUS | Status: AC
  Administered 2015-08-16 – 2015-08-18 (×3): 125 mg via INTRAVENOUS
  Filled 2015-08-16 (×5): qty 10

## 2015-08-16 MED ORDER — DARBEPOETIN ALFA 150 MCG/0.3ML IJ SOSY
150.0000 ug | PREFILLED_SYRINGE | INTRAMUSCULAR | Status: DC
  Administered 2015-08-16 – 2015-08-23 (×2): 150 ug via SUBCUTANEOUS
  Filled 2015-08-16 (×3): qty 0.3
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
## 2015-08-16 NOTE — Progress Notes (Signed)
Physical Therapy Wound Treatment/DC Note Patient Details  Name: Phillip [name] MRN: [id number] Date of Birth: 30-Dec-1965  Today's Date: 08/16/2015 Time: 1638-4536 Time Calculation (min): 29 min  Subjective  Subjective: Pt stated he was willing to try the A cell graft. Patient and Family Stated Goals: Not stated Prior Treatments: Air overlay  Pain Score:  None  Wound Assessment  Pressure Ulcer 08/05/15 Stage IV - Full thickness tissue loss with exposed bone, tendon or muscle. large wound with slough tissue noted to the middle some pink tissue noted 9/6 Beefy red stage 4 wound after surgery and hydrotherapy (Active)  Dressing Type Gauze (Comment);Barrier Film (skin prep);ABD;Moist to moist 08/16/2015 10:00 AM  Dressing Changed;Clean;Dry;Intact 08/16/2015 10:00 AM  Dressing Change Frequency Twice a day 08/16/2015 10:00 AM  State of Healing Early/partial granulation 08/16/2015 10:00 AM  Site / Wound Assessment Yellow;Red;Granulation tissue;Other (Comment) 08/16/2015 10:00 AM  % Wound base Red or Granulating 95% 08/16/2015 10:00 AM  % Wound base Yellow 0% 08/16/2015 10:00 AM  % Wound base Black 0% 08/16/2015 10:00 AM  % Wound base Other (Comment) 5% 08/16/2015 10:00 AM  Peri-wound Assessment Intact 08/16/2015 10:00 AM  Wound Length (cm) 16 cm 08/15/2015  8:27 PM  Wound Width (cm) 15.5 cm 08/15/2015  8:27 PM  Wound Depth (cm) 3 cm 08/15/2015  8:27 PM  Tunneling (cm) 3 08/06/2015  8:58 AM  Margins Unattached edges (unapproximated) 08/16/2015 10:00 AM  Drainage Amount Copious 08/16/2015 10:00 AM  Drainage Description Serosanguineous 08/16/2015 10:00 AM  Treatment Hydrotherapy (Pulse lavage);Packing (Saline gauze) 08/16/2015 10:00 AM   Hydrotherapy Pulsed lavage therapy - wound location: sacrum Pulsed Lavage with Suction (psi): 8 psi (4-8 psi) Pulsed Lavage with Suction - Normal Saline Used: 1000 mL Pulsed Lavage Tip: Tip with splash shield   Wound Assessment and Plan  Wound Therapy -  Assess/Plan/Recommendations Wound Therapy - Clinical Statement: Wound looks excellent. No further need for hydrotherapy. Ready for next step.  Wound Therapy - Functional Problem List: Limited sitting due to ulcer Factors Delaying/Impairing Wound Healing: Incontinence;Infection - systemic/local;Immobility;Multiple medical problems;Polypharmacy;Tobacco use;Altered sensation Hydrotherapy Plan: Debridement;Dressing change;Patient/family education;Pulsatile lavage with suction Wound Therapy - Frequency: 6X / week Wound Therapy - Current Recommendations: WOC nurse (between plastics visits) Wound Therapy - Follow Up Recommendations: Skilled nursing facility Wound Plan: See above  Wound Therapy Goals- Improve the function of patient's integumentary system by progressing the wound(s) through the phases of wound healing (inflammation - proliferation - remodeling) by: Decrease Necrotic Tissue to: 0 Decrease Necrotic Tissue - Progress: Met Increase Granulation Tissue to: 95 (other 5% white connective tissue) Increase Granulation Tissue - Progress: Met Improve Drainage Characteristics: Serous;Mod Improve Drainage Characteristics - Progress: Partly met Goals/treatment plan/discharge plan were made with and agreed upon by patient/family: Yes Time For Goal Achievement: 7 days (next due 9/12) Wound Therapy - Potential for Goals: Good  Goals will be updated until maximal potential achieved or discharge criteria met.  Discharge criteria: when goals achieved, discharge from hospital, MD decision/surgical intervention, no progress towards goals, refusal/missing three consecutive treatments without notification or medical reason.  GP     Christiann Hagerty 08/16/2015, 11:05 AM Suanne Marker PT [phone]

## 2015-08-16 NOTE — Consult Note (Addendum)
WOC wound follow-up consult note Reason for Consult: Pt has previously been followed by surgical team for debridement of sacrum, physical therapy for hydrotherapy, and plastic surgery team for assessment of wound closure last week.  Pt was uncertain of using a skin substitute with pork products at that time, but states he has made up his mind and wants to pursue this option.  EMR indicates that primary team will re-consult Dr Migdalia Dk today to discuss the next steps. Wound type: Sacrum with stage 4 wound; 95% beefy red, 5% exposed muscle and bone.   Pressure Ulcer POA: Yes Measurement:16X15.5X3cm Drainage (amount, consistency, odor) Large amt yellow drainage with some odor. Bleeds easily in small amts when touched. Periwound: Intact skin surrounding, but pt is incontinent of stool at times. Dressing procedure/placement/frequency: Wound bed is now clean and hydrotherapy is no longer indicated at this time; will discontinue this and also the Santyl to sacrum wound.  Bedside nurses to change moist gauze dressings BID to avoid leakage onto healthy tissue. Air mattress remains in place to decrease pressure, heel lift boots on. No family at bedside to discuss plan of care. Vac is not an option related to close proximity to rectum; it would not be able to maintain a seal or avoid contamination with stool. Pt states he does not want a colostomy. Pt has decided he definitely wants to pursue wound closure options with the plastics team.  Please re-consult them for further plan of care.   Please re-consult WOC if further assistance is needed. Thank-you,  Julien Girt MSN, [place], [place], [place], [place]

## 2015-08-16 NOTE — Clinical Social Work Note (Signed)
CSW has made multiple attempts to get answer from [facility] regarding patient's ability to return at discharge. Apparently there is concern that the patient does not have current insurance. Facility has stated that they will get back to CSW. CSW never received return call today. Handoff received indicates that the patient CAN return to [facility] at discharge.    Liz Beach MSW, [place], [place], [phone]
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
## 2015-08-17 ENCOUNTER — Encounter (HOSPITAL_COMMUNITY): Payer: Self-pay | Admitting: Anesthesiology

## 2015-08-17 ENCOUNTER — Inpatient Hospital Stay (HOSPITAL_COMMUNITY): Payer: Medicaid Other

## 2015-08-17 LAB — CBC WITH DIFFERENTIAL/PLATELET
BASOS ABS: 0 10*3/uL (ref 0.0–0.1)
Basophils Relative: 0 % (ref 0–1)
EOS ABS: 0.4 10*3/uL (ref 0.0–0.7)
Eosinophils Relative: 4 % (ref 0–5)
HCT: 21.8 % — ABNORMAL LOW (ref 39.0–52.0)
HEMOGLOBIN: 7 g/dL — AB (ref 13.0–17.0)
LYMPHS PCT: 25 % (ref 12–46)
Lymphs Abs: 2.3 10*3/uL (ref 0.7–4.0)
MCH: 25.8 pg — AB (ref 26.0–34.0)
MCHC: 32.1 g/dL (ref 30.0–36.0)
MCV: 80.4 fL (ref 78.0–100.0)
MONO ABS: 0.7 10*3/uL (ref 0.1–1.0)
Monocytes Relative: 8 % (ref 3–12)
NEUTROS ABS: 5.6 10*3/uL (ref 1.7–7.7)
Neutrophils Relative %: 63 % (ref 43–77)
PLATELETS: 612 10*3/uL — AB (ref 150–400)
RBC: 2.71 MIL/uL — ABNORMAL LOW (ref 4.22–5.81)
RDW: 14.5 % (ref 11.5–15.5)
WBC: 9 10*3/uL (ref 4.0–10.5)

## 2015-08-17 LAB — URINE MICROSCOPIC-ADD ON

## 2015-08-17 LAB — URINALYSIS, ROUTINE W REFLEX MICROSCOPIC
BILIRUBIN URINE: NEGATIVE
Glucose, UA: NEGATIVE mg/dL
Ketones, ur: NEGATIVE mg/dL
NITRITE: NEGATIVE
PH: 8 (ref 5.0–8.0)
Protein, ur: 30 mg/dL — AB
SPECIFIC GRAVITY, URINE: 1.008 (ref 1.005–1.030)
Urobilinogen, UA: 0.2 mg/dL (ref 0.0–1.0)

## 2015-08-17 LAB — RENAL FUNCTION PANEL
ANION GAP: 12 (ref 5–15)
Albumin: 2 g/dL — ABNORMAL LOW (ref 3.5–5.0)
BUN: 43 mg/dL — ABNORMAL HIGH (ref 6–20)
CALCIUM: 9.4 mg/dL (ref 8.9–10.3)
CHLORIDE: 97 mmol/L — AB (ref 101–111)
CO2: 26 mmol/L (ref 22–32)
Creatinine, Ser: 5.84 mg/dL — ABNORMAL HIGH (ref 0.61–1.24)
GFR calc Af Amer: 12 mL/min — ABNORMAL LOW (ref >60)
GFR calc non Af Amer: 10 mL/min — ABNORMAL LOW (ref >60)
GLUCOSE: 91 mg/dL (ref 65–99)
Phosphorus: 6 mg/dL — ABNORMAL HIGH (ref 2.5–4.6)
Potassium: 4.9 mmol/L (ref 3.5–5.1)
SODIUM: 135 mmol/L (ref 135–145)

## 2015-08-17 LAB — VANCOMYCIN, RANDOM: Vancomycin Rm: 41 ug/mL

## 2015-08-17 MED ORDER — SODIUM CHLORIDE 0.9 % IV SOLN
500.0000 mg | Freq: Two times a day (BID) | INTRAVENOUS | Status: DC
  Administered 2015-08-17 – 2015-08-23 (×13): 500 mg via INTRAVENOUS
  Filled 2015-08-17 (×14): qty 0.5

## 2015-08-17 MED ORDER — WHITE PETROLATUM GEL
Status: AC
  Administered 2015-08-17: 0.2
  Filled 2015-08-17: qty 1
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
## 2015-08-17 NOTE — Consult Note (Signed)
Sonja Wilson, EdD 

## 2015-08-17 NOTE — Progress Notes (Addendum)
ANTIBIOTIC CONSULT NOTE - FOLLOW UP  Pharmacy Consult for Meropenem Indication:  Wound infection  No Known Allergies  Patient Measurements: Height: 5\' 9"  (175.3 cm) Weight: 158 lb 8 oz (71.895 kg) IBW/kg (Calculated) : 70.7  Vital Signs: Temp: 98.8 F (37.1 C) (09/07 1340) Temp Source: Oral (09/07 1340) BP: 100/64 mmHg (09/07 1340) Pulse Rate: 103 (09/07 1340) Intake/Output from previous day: 09/06 0701 - 09/07 0700 In: 730 [P.O.:720; I.V.:10] Out: 2100 [Urine:2100] Intake/Output from this shift: Total I/O In: 720 [P.O.:720] Out: 550 [Urine:550]  Labs:  Recent Labs  08/15/15 0550 08/16/15 0520 08/16/15 2310 08/17/15 0407  WBC 9.1  --  9.0  --   HGB 7.1*  --  7.0*  --   PLT 664*  --  612*  --   CREATININE 5.11* 5.56*  --  5.84*   Estimated Creatinine Clearance: 15.8 mL/min (by C-G formula based on Cr of 5.84).  Recent Labs  08/17/15 0455  [facility]     Microbiology: Recent Results (from the past 720 hour(s))  Urine culture     Status: None   Collection Time: 08/05/15  5:23 PM  Result Value Ref Range Status   Specimen Description URINE, RANDOM  Final   Special Requests NONE  Final   Culture NO GROWTH 1 DAY  Final   Report Status 08/06/2015 FINAL  Final  Blood Culture (routine x 2)     Status: None   Collection Time: 08/05/15  5:34 PM  Result Value Ref Range Status   Specimen Description BLOOD RIGHT HAND  Final   Special Requests BOTTLES DRAWN AEROBIC AND ANAEROBIC 5CC  Final   Culture  Setup Time   Final    GRAM POSITIVE COCCI IN CLUSTERS AEROBIC BOTTLE ONLY CRITICAL RESULT CALLED TO, READ BACK BY AND VERIFIED WITH: V SMITH,RN AT 1640 08/07/15 BY L BENFIELD    Culture   Final    STAPHYLOCOCCUS SPECIES (COAGULASE NEGATIVE) THE SIGNIFICANCE OF ISOLATING THIS ORGANISM FROM A SINGLE SET OF BLOOD CULTURES WHEN MULTIPLE SETS ARE DRAWN IS UNCERTAIN. PLEASE NOTIFY THE MICROBIOLOGY DEPARTMENT WITHIN ONE WEEK IF SPECIATION AND SENSITIVITIES ARE REQUIRED.     Report Status 08/13/2015 FINAL  Final  Blood Culture (routine x 2)     Status: None   Collection Time: 08/05/15  5:40 PM  Result Value Ref Range Status   Specimen Description BLOOD LEFT HAND  Final   Special Requests BOTTLES DRAWN AEROBIC AND ANAEROBIC 5CC  Final   Culture NO GROWTH 5 DAYS  Final   Report Status 08/10/2015 FINAL  Final  MRSA PCR Screening     Status: None   Collection Time: 08/05/15 10:18 PM  Result Value Ref Range Status   MRSA by PCR NEGATIVE NEGATIVE Final    Comment:        The GeneXpert MRSA Assay (FDA approved for NASAL specimens only), is one component of a comprehensive MRSA colonization surveillance program. It is not intended to diagnose MRSA infection nor to guide or monitor treatment for MRSA infections.   Wound culture     Status: None   Collection Time: 08/07/15  7:25 PM  Result Value Ref Range Status   Specimen Description SACRAL  Final   Special Requests NONE  Final   Gram Stain   Final    RARE WBC PRESENT, PREDOMINANTLY PMN RARE SQUAMOUS EPITHELIAL CELLS PRESENT ABUNDANT GRAM NEGATIVE RODS FEW GRAM POSITIVE COCCI IN PAIRS Performed at [facility]    Culture   Final  ABUNDANT PSEUDOMONAS AERUGINOSA PROVIDENCIA STUARTII Performed at [facility]    Report Status 08/11/2015 FINAL  Final   Organism ID, Bacteria PSEUDOMONAS AERUGINOSA  Final   Organism ID, Bacteria PROVIDENCIA STUARTII  Final      Susceptibility   Pseudomonas aeruginosa - MIC*    CEFEPIME 2 SENSITIVE Sensitive     CEFTAZIDIME 4 SENSITIVE Sensitive     CIPROFLOXACIN 1 SENSITIVE Sensitive     GENTAMICIN <=1 SENSITIVE Sensitive     IMIPENEM <=0.25 SENSITIVE Sensitive     PIP/TAZO 8 SENSITIVE Sensitive     TOBRAMYCIN <=1 SENSITIVE Sensitive     * ABUNDANT PSEUDOMONAS AERUGINOSA   Providencia stuartii - MIC*    AMPICILLIN RESISTANT      AMPICILLIN/SULBACTAM 16 INTERMEDIATE Intermediate     CEFAZOLIN >=64 RESISTANT Resistant     CEFEPIME <=1  SENSITIVE Sensitive     CEFTAZIDIME <=1 SENSITIVE Sensitive     CEFTRIAXONE <=1 SENSITIVE Sensitive     CIPROFLOXACIN >=4 RESISTANT Resistant     GENTAMICIN RESISTANT      IMIPENEM 1 SENSITIVE Sensitive     PIP/TAZO <=4 SENSITIVE Sensitive     TOBRAMYCIN RESISTANT      TRIMETH/SULFA 160 RESISTANT Resistant     * PROVIDENCIA STUARTII  Culture, blood (routine x 2)     Status: None   Collection Time: 08/08/15  9:10 AM  Result Value Ref Range Status   Specimen Description BLOOD LEFT HAND  Final   Special Requests BOTTLES DRAWN AEROBIC AND ANAEROBIC 5CC  Final   Culture NO GROWTH 5 DAYS  Final   Report Status 08/13/2015 FINAL  Final  Culture, blood (routine x 2)     Status: None   Collection Time: 08/08/15  9:18 AM  Result Value Ref Range Status   Specimen Description BLOOD RIGHT HAND  Final   Special Requests BOTTLES DRAWN AEROBIC AND ANAEROBIC 5CC  Final   Culture NO GROWTH 5 DAYS  Final   Report Status 08/13/2015 FINAL  Final  Culture, blood (routine x 2)     Status: None (Preliminary result)   Collection Time: 08/17/15  9:54 AM  Result Value Ref Range Status   Specimen Description BLOOD LEFT HAND  Final   Special Requests BOTTLES DRAWN AEROBIC AND ANAEROBIC 10CC  Final   Culture PENDING  Incomplete   Report Status PENDING  Incomplete    Anti-infectives    Start     Dose/Rate Route Frequency Ordered Stop   08/14/15 1415  piperacillin-tazobactam (ZOSYN) IVPB 2.25 g  Status:  Discontinued     2.25 g 100 mL/hr over 30 Minutes Intravenous Every 8 hours 08/14/15 1000 08/15/15 1505   08/11/15 1415  piperacillin-tazobactam (ZOSYN) IVPB 3.375 g  Status:  Discontinued     3.375 g 12.5 mL/hr over 240 Minutes Intravenous Every 8 hours 08/11/15 1404 08/14/15 1000   08/09/15 1500  ciprofloxacin (CIPRO) tablet 750 mg  Status:  Discontinued     750 mg Oral Every 12 hours 08/09/15 1447 08/11/15 1353   08/09/15 1500  metroNIDAZOLE (FLAGYL) tablet 500 mg  Status:  Discontinued     500 mg Oral  3 times per day 08/09/15 1447 08/11/15 1353   08/06/15 0900  vancomycin (VANCOCIN) 1,500 mg in sodium chloride 0.9 % 500 mL IVPB  Status:  Discontinued     1,500 mg 250 mL/hr over 120 Minutes Intravenous Every 12 hours 08/05/15 2024 08/12/15 2142   08/06/15 0200  vancomycin (VANCOCIN) IVPB 750 mg/150 ml  premix  Status:  Discontinued     750 mg 150 mL/hr over 60 Minutes Intravenous Every 8 hours 08/05/15 1914 08/05/15 2011   08/06/15 0130  piperacillin-tazobactam (ZOSYN) IVPB 3.375 g  Status:  Discontinued     3.375 g 12.5 mL/hr over 240 Minutes Intravenous 3 times per day 08/05/15 1914 08/09/15 1447   08/05/15 2100  vancomycin (VANCOCIN) 500 mg in sodium chloride 0.9 % 100 mL IVPB     500 mg 100 mL/hr over 60 Minutes Intravenous  Once 08/05/15 2024 08/06/15 0153   08/05/15 1700  piperacillin-tazobactam (ZOSYN) IVPB 3.375 g     3.375 g 100 mL/hr over 30 Minutes Intravenous  Once 08/05/15 1645 08/05/15 1829   08/05/15 1700  vancomycin (VANCOCIN) IVPB 1000 mg/200 mL premix     1,000 mg 200 mL/hr over 60 Minutes Intravenous  Once 08/05/15 1645 08/05/15 1929      Assessment: 49yo quadriplegic male who was just here in July for HCAP presents to the ED with sepsis 2/2 to his sacral pressure ulcer.  Previously on vanc/zosyn with large bump in SCr and continued supratherapeutic levels of vanc. Pharmacy consulted to start imipenem - patient has history of seizures. Switch to meropenem per Dr. Erlinda Hong. Tmax/24h 101.2, wbc wnl. D/c vanc/zosyn consults. SCr continues to bump 5.56>>5.84, CrCl~15  8/26 Vanc>>9/2 (On hold - stopped 9/7)  8/26 Zosyn 3.375 Q8H>>8/30 Restart 9/1>9/5 (On hold - stopped 9/7)  8/31 Cipro/Flagyl po>9/1  9/7 meropenem>>   Plan: Start meropenem 500mg  IV q12h for now Monitor renal function - continues to worsening, may need to switch to q24h dosing Mon clinical progress, c/s, abx plan   Elicia Lamp, PharmD Clinical Pharmacist Pager [phone] 08/17/2015 3:35 PM
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
## 2015-08-18 ENCOUNTER — Encounter (HOSPITAL_COMMUNITY)
Admission: EM | Disposition: A | Discharge status: Skilled Nursing Facility | Payer: Self-pay | Source: Home / Self Care | Attending: Internal Medicine

## 2015-08-18 ENCOUNTER — Encounter (HOSPITAL_COMMUNITY): Payer: Self-pay | Admitting: Plastic Surgery

## 2015-08-18 ENCOUNTER — Other Ambulatory Visit: Payer: Self-pay | Admitting: Plastic Surgery

## 2015-08-18 DIAGNOSIS — L89154 Pressure ulcer of sacral region, stage 4: Secondary | ICD-10-CM

## 2015-08-18 LAB — CBC WITH DIFFERENTIAL/PLATELET
Basophils Absolute: 0.1 10*3/uL (ref 0.0–0.1)
Basophils Relative: 1 % (ref 0–1)
EOS ABS: 0.5 10*3/uL (ref 0.0–0.7)
EOS PCT: 5 % (ref 0–5)
HCT: 22.7 % — ABNORMAL LOW (ref 39.0–52.0)
Hemoglobin: 6.7 g/dL — CL (ref 13.0–17.0)
LYMPHS ABS: 2.9 10*3/uL (ref 0.7–4.0)
Lymphocytes Relative: 26 % (ref 12–46)
MCH: 24.5 pg — AB (ref 26.0–34.0)
MCHC: 30 g/dL (ref 30.0–36.0)
MCV: 81.7 fL (ref 78.0–100.0)
MONOS PCT: 12 % (ref 3–12)
Monocytes Absolute: 1.4 10*3/uL — ABNORMAL HIGH (ref 0.1–1.0)
Neutro Abs: 6.2 10*3/uL (ref 1.7–7.7)
Neutrophils Relative %: 57 % (ref 43–77)
PLATELETS: 657 10*3/uL — AB (ref 150–400)
RBC: 2.78 MIL/uL — AB (ref 4.22–5.81)
RDW: 14.9 % (ref 11.5–15.5)
WBC: 10.6 10*3/uL — AB (ref 4.0–10.5)

## 2015-08-18 LAB — RENAL FUNCTION PANEL
Albumin: 2 g/dL — ABNORMAL LOW (ref 3.5–5.0)
Anion gap: 11 (ref 5–15)
BUN: 46 mg/dL — AB (ref 6–20)
CHLORIDE: 96 mmol/L — AB (ref 101–111)
CO2: 27 mmol/L (ref 22–32)
CREATININE: 5.78 mg/dL — AB (ref 0.61–1.24)
Calcium: 9.3 mg/dL (ref 8.9–10.3)
GFR calc Af Amer: 12 mL/min — ABNORMAL LOW (ref >60)
GFR, EST NON AFRICAN AMERICAN: 11 mL/min — AB (ref >60)
Glucose, Bld: 87 mg/dL (ref 65–99)
Phosphorus: 6.2 mg/dL — ABNORMAL HIGH (ref 2.5–4.6)
Potassium: 5.1 mmol/L (ref 3.5–5.1)
SODIUM: 134 mmol/L — AB (ref 135–145)

## 2015-08-18 LAB — PREPARE RBC (CROSSMATCH)

## 2015-08-18 SURGERY — IRRIGATION AND DEBRIDEMENT WOUND
Anesthesia: General

## 2015-08-18 MED ORDER — SODIUM CHLORIDE 0.9 % IV SOLN
Freq: Once | INTRAVENOUS | Status: AC
  Administered 2015-08-18: 07:00:00 via INTRAVENOUS
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
## 2015-08-18 NOTE — Interval H&P Note (Signed)
History and Physical Interval Note:  08/18/2015 7:46 AM  Phillip [name]  has presented today for surgery, with the diagnosis of SACRAL DECUBITUS ULCER   The various methods of treatment have been discussed with the patient and family. After consideration of risks, benefits and other options for treatment, the patient has consented to  Procedure(s): IRRIGATION AND DEBRIDEMENT SACRAL DECUBITUS ULCER  (N/A) PLACEMENT OF A-CELL AND POSSIBLE VAC  (N/A) as a surgical intervention .  The patient's history has been reviewed, patient examined, no change in status, stable for surgery.  I have reviewed the patient's chart and labs.  Questions were answered to the patient's satisfaction.     SANGER,CLAIRE

## 2015-08-18 NOTE — Progress Notes (Signed)
CRITICAL VALUE ALERT  Critical value received:  hgb 6.7  Date of notification:  08/18/2015  Time of notification:  0637  Critical value read back: yes  Nurse who received alert:  Arthor Captain LPN  MD notified (1st page):  yes  Time of first page:  [phone]  MD notified (2nd page):  Time of second page:  Responding MD:  K.Schorr  Time MD responded:  [phone]

## 2015-08-19 LAB — CBC WITH DIFFERENTIAL/PLATELET
BASOS ABS: 0.1 10*3/uL (ref 0.0–0.1)
Basophils Relative: 1 % (ref 0–1)
EOS ABS: 0.5 10*3/uL (ref 0.0–0.7)
Eosinophils Relative: 6 % — ABNORMAL HIGH (ref 0–5)
HEMATOCRIT: 24.5 % — AB (ref 39.0–52.0)
HEMOGLOBIN: 8 g/dL — AB (ref 13.0–17.0)
LYMPHS PCT: 28 % (ref 12–46)
Lymphs Abs: 2.4 10*3/uL (ref 0.7–4.0)
MCH: 27 pg (ref 26.0–34.0)
MCHC: 32.7 g/dL (ref 30.0–36.0)
MCV: 82.8 fL (ref 78.0–100.0)
MONOS PCT: 14 % — AB (ref 3–12)
Monocytes Absolute: 1.2 10*3/uL — ABNORMAL HIGH (ref 0.1–1.0)
Neutro Abs: 4.3 10*3/uL (ref 1.7–7.7)
Neutrophils Relative %: 51 % (ref 43–77)
Platelets: 643 10*3/uL — ABNORMAL HIGH (ref 150–400)
RBC: 2.96 MIL/uL — AB (ref 4.22–5.81)
RDW: 15.1 % (ref 11.5–15.5)
WBC: 8.5 10*3/uL (ref 4.0–10.5)

## 2015-08-19 LAB — TYPE AND SCREEN
ABO/RH(D): O POS
Antibody Screen: NEGATIVE
Unit division: 0

## 2015-08-19 LAB — RENAL FUNCTION PANEL
ANION GAP: 10 (ref 5–15)
Albumin: 2.1 g/dL — ABNORMAL LOW (ref 3.5–5.0)
BUN: 48 mg/dL — ABNORMAL HIGH (ref 6–20)
CHLORIDE: 99 mmol/L — AB (ref 101–111)
CO2: 28 mmol/L (ref 22–32)
Calcium: 9.5 mg/dL (ref 8.9–10.3)
Creatinine, Ser: 5.59 mg/dL — ABNORMAL HIGH (ref 0.61–1.24)
GFR, EST AFRICAN AMERICAN: 13 mL/min — AB (ref >60)
GFR, EST NON AFRICAN AMERICAN: 11 mL/min — AB (ref >60)
Glucose, Bld: 88 mg/dL (ref 65–99)
POTASSIUM: 4.9 mmol/L (ref 3.5–5.1)
Phosphorus: 7 mg/dL — ABNORMAL HIGH (ref 2.5–4.6)
Sodium: 137 mmol/L (ref 135–145)

## 2015-08-19 LAB — CREATININE, SERUM
Creatinine, Ser: 5.65 mg/dL — ABNORMAL HIGH (ref 0.61–1.24)
GFR calc Af Amer: 13 mL/min — ABNORMAL LOW (ref >60)
GFR calc non Af Amer: 11 mL/min — ABNORMAL LOW (ref >60)

## 2015-08-19 LAB — VANCOMYCIN, RANDOM: VANCOMYCIN RM: 30 ug/mL

## 2015-08-19 NOTE — Clinical Social Work Note (Signed)
[place] confirms that the patient can return to their facility at discharge as his [facility] has been "reinstated." CSW will continue to follow for DC needs.   Liz Beach MSW, [place], [place], [phone]
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
## 2015-08-20 DIAGNOSIS — L8990 Pressure ulcer of unspecified site, unspecified stage: Secondary | ICD-10-CM

## 2015-08-20 DIAGNOSIS — M869 Osteomyelitis, unspecified: Secondary | ICD-10-CM

## 2015-08-20 DIAGNOSIS — F1020 Alcohol dependence, uncomplicated: Secondary | ICD-10-CM

## 2015-08-20 DIAGNOSIS — R739 Hyperglycemia, unspecified: Secondary | ICD-10-CM

## 2015-08-20 DIAGNOSIS — S14109A Unspecified injury at unspecified level of cervical spinal cord, initial encounter: Secondary | ICD-10-CM

## 2015-08-20 LAB — RENAL FUNCTION PANEL
Albumin: 2.2 g/dL — ABNORMAL LOW (ref 3.5–5.0)
Anion gap: 13 (ref 5–15)
BUN: 49 mg/dL — AB (ref 6–20)
CALCIUM: 9.6 mg/dL (ref 8.9–10.3)
CO2: 27 mmol/L (ref 22–32)
CREATININE: 4.58 mg/dL — AB (ref 0.61–1.24)
Chloride: 98 mmol/L — ABNORMAL LOW (ref 101–111)
GFR, EST AFRICAN AMERICAN: 16 mL/min — AB (ref >60)
GFR, EST NON AFRICAN AMERICAN: 14 mL/min — AB (ref >60)
Glucose, Bld: 103 mg/dL — ABNORMAL HIGH (ref 65–99)
Phosphorus: 6.4 mg/dL — ABNORMAL HIGH (ref 2.5–4.6)
Potassium: 4.5 mmol/L (ref 3.5–5.1)
SODIUM: 138 mmol/L (ref 135–145)

## 2015-08-20 NOTE — Progress Notes (Signed)
ANTIBIOTIC CONSULT NOTE - FOLLOW UP  Pharmacy Consult for Meropenem Indication:  Wound infection  No Known Allergies  Patient Measurements: Height: 5\' 9"  (175.3 cm) Weight: 158 lb 8 oz (71.895 kg) IBW/kg (Calculated) : 70.7  Vital Signs: Temp: 98.9 F (37.2 C) (09/10 0539) Temp Source: Oral (09/10 0135) BP: 124/77 mmHg (09/10 0539) Pulse Rate: 105 (09/10 0539) Intake/Output from previous day: 09/09 0701 - 09/10 0700 In: 240 [P.O.:240] Out: 2400 [Urine:2400] Intake/Output from this shift:    Labs:  Recent Labs  08/18/15 0500 08/19/15 0508 08/20/15 0545  WBC 10.6* 8.5  --   HGB 6.7* 8.0*  --   PLT 657* 643*  --   CREATININE 5.78* 5.59*  5.65* 4.58*   Estimated Creatinine Clearance: 20.2 mL/min (by C-G formula based on Cr of 4.58).  Recent Labs  08/19/15 0508  [facility] 30     Microbiology: Recent Results (from the past 720 hour(s))  Urine culture     Status: None   Collection Time: 08/05/15  5:23 PM  Result Value Ref Range Status   Specimen Description URINE, RANDOM  Final   Special Requests NONE  Final   Culture NO GROWTH 1 DAY  Final   Report Status 08/06/2015 FINAL  Final  Blood Culture (routine x 2)     Status: None   Collection Time: 08/05/15  5:34 PM  Result Value Ref Range Status   Specimen Description BLOOD RIGHT HAND  Final   Special Requests BOTTLES DRAWN AEROBIC AND ANAEROBIC 5CC  Final   Culture  Setup Time   Final    GRAM POSITIVE COCCI IN CLUSTERS AEROBIC BOTTLE ONLY CRITICAL RESULT CALLED TO, READ BACK BY AND VERIFIED WITH: V SMITH,RN AT 1640 08/07/15 BY L BENFIELD    Culture   Final    STAPHYLOCOCCUS SPECIES (COAGULASE NEGATIVE) THE SIGNIFICANCE OF ISOLATING THIS ORGANISM FROM A SINGLE SET OF BLOOD CULTURES WHEN MULTIPLE SETS ARE DRAWN IS UNCERTAIN. PLEASE NOTIFY THE MICROBIOLOGY DEPARTMENT WITHIN ONE WEEK IF SPECIATION AND SENSITIVITIES ARE REQUIRED.    Report Status 08/13/2015 FINAL  Final  Blood Culture (routine x 2)     Status:  None   Collection Time: 08/05/15  5:40 PM  Result Value Ref Range Status   Specimen Description BLOOD LEFT HAND  Final   Special Requests BOTTLES DRAWN AEROBIC AND ANAEROBIC 5CC  Final   Culture NO GROWTH 5 DAYS  Final   Report Status 08/10/2015 FINAL  Final  MRSA PCR Screening     Status: None   Collection Time: 08/05/15 10:18 PM  Result Value Ref Range Status   MRSA by PCR NEGATIVE NEGATIVE Final    Comment:        The GeneXpert MRSA Assay (FDA approved for NASAL specimens only), is one component of a comprehensive MRSA colonization surveillance program. It is not intended to diagnose MRSA infection nor to guide or monitor treatment for MRSA infections.   Wound culture     Status: None   Collection Time: 08/07/15  7:25 PM  Result Value Ref Range Status   Specimen Description SACRAL  Final   Special Requests NONE  Final   Gram Stain   Final    RARE WBC PRESENT, PREDOMINANTLY PMN RARE SQUAMOUS EPITHELIAL CELLS PRESENT ABUNDANT GRAM NEGATIVE RODS FEW GRAM POSITIVE COCCI IN PAIRS Performed at [facility]    Culture   Final    ABUNDANT PSEUDOMONAS AERUGINOSA PROVIDENCIA STUARTII Performed at [facility]    Report Status 08/11/2015  FINAL  Final   Organism ID, Bacteria PSEUDOMONAS AERUGINOSA  Final   Organism ID, Bacteria PROVIDENCIA STUARTII  Final      Susceptibility   Pseudomonas aeruginosa - MIC*    CEFEPIME 2 SENSITIVE Sensitive     CEFTAZIDIME 4 SENSITIVE Sensitive     CIPROFLOXACIN 1 SENSITIVE Sensitive     GENTAMICIN <=1 SENSITIVE Sensitive     IMIPENEM <=0.25 SENSITIVE Sensitive     PIP/TAZO 8 SENSITIVE Sensitive     TOBRAMYCIN <=1 SENSITIVE Sensitive     * ABUNDANT PSEUDOMONAS AERUGINOSA   Providencia stuartii - MIC*    AMPICILLIN RESISTANT      AMPICILLIN/SULBACTAM 16 INTERMEDIATE Intermediate     CEFAZOLIN >=64 RESISTANT Resistant     CEFEPIME <=1 SENSITIVE Sensitive     CEFTAZIDIME <=1 SENSITIVE Sensitive     CEFTRIAXONE <=1  SENSITIVE Sensitive     CIPROFLOXACIN >=4 RESISTANT Resistant     GENTAMICIN RESISTANT      IMIPENEM 1 SENSITIVE Sensitive     PIP/TAZO <=4 SENSITIVE Sensitive     TOBRAMYCIN RESISTANT      TRIMETH/SULFA 160 RESISTANT Resistant     * PROVIDENCIA STUARTII  Culture, blood (routine x 2)     Status: None   Collection Time: 08/08/15  9:10 AM  Result Value Ref Range Status   Specimen Description BLOOD LEFT HAND  Final   Special Requests BOTTLES DRAWN AEROBIC AND ANAEROBIC 5CC  Final   Culture NO GROWTH 5 DAYS  Final   Report Status 08/13/2015 FINAL  Final  Culture, blood (routine x 2)     Status: None   Collection Time: 08/08/15  9:18 AM  Result Value Ref Range Status   Specimen Description BLOOD RIGHT HAND  Final   Special Requests BOTTLES DRAWN AEROBIC AND ANAEROBIC 5CC  Final   Culture NO GROWTH 5 DAYS  Final   Report Status 08/13/2015 FINAL  Final  Culture, blood (routine x 2)     Status: None (Preliminary result)   Collection Time: 08/17/15  9:54 AM  Result Value Ref Range Status   Specimen Description BLOOD LEFT HAND  Final   Special Requests BOTTLES DRAWN AEROBIC AND ANAEROBIC 10CC  Final   Culture NO GROWTH 2 DAYS  Final   Report Status PENDING  Incomplete  Culture, blood (routine x 2)     Status: None (Preliminary result)   Collection Time: 08/17/15 10:04 AM  Result Value Ref Range Status   Specimen Description BLOOD RIGHT HAND  Final   Special Requests BOTTLES DRAWN AEROBIC ONLY 10CC  Final   Culture NO GROWTH 2 DAYS  Final   Report Status PENDING  Incomplete    Anti-infectives    Start     Dose/Rate Route Frequency Ordered Stop   08/17/15 1545  meropenem (MERREM) 500 mg in sodium chloride 0.9 % 50 mL IVPB     500 mg 100 mL/hr over 30 Minutes Intravenous Every 12 hours 08/17/15 1533     08/14/15 1415  piperacillin-tazobactam (ZOSYN) IVPB 2.25 g  Status:  Discontinued     2.25 g 100 mL/hr over 30 Minutes Intravenous Every 8 hours 08/14/15 1000 08/15/15 1505   08/11/15  1415  piperacillin-tazobactam (ZOSYN) IVPB 3.375 g  Status:  Discontinued     3.375 g 12.5 mL/hr over 240 Minutes Intravenous Every 8 hours 08/11/15 1404 08/14/15 1000   08/09/15 1500  ciprofloxacin (CIPRO) tablet 750 mg  Status:  Discontinued     750 mg Oral Every  12 hours 08/09/15 1447 08/11/15 1353   08/09/15 1500  metroNIDAZOLE (FLAGYL) tablet 500 mg  Status:  Discontinued     500 mg Oral 3 times per day 08/09/15 1447 08/11/15 1353   08/06/15 0900  vancomycin (VANCOCIN) 1,500 mg in sodium chloride 0.9 % 500 mL IVPB  Status:  Discontinued     1,500 mg 250 mL/hr over 120 Minutes Intravenous Every 12 hours 08/05/15 2024 08/12/15 2142   08/06/15 0200  vancomycin (VANCOCIN) IVPB 750 mg/150 ml premix  Status:  Discontinued     750 mg 150 mL/hr over 60 Minutes Intravenous Every 8 hours 08/05/15 1914 08/05/15 2011   08/06/15 0130  piperacillin-tazobactam (ZOSYN) IVPB 3.375 g  Status:  Discontinued     3.375 g 12.5 mL/hr over 240 Minutes Intravenous 3 times per day 08/05/15 1914 08/09/15 1447   08/05/15 2100  vancomycin (VANCOCIN) 500 mg in sodium chloride 0.9 % 100 mL IVPB     500 mg 100 mL/hr over 60 Minutes Intravenous  Once 08/05/15 2024 08/06/15 0153   08/05/15 1700  piperacillin-tazobactam (ZOSYN) IVPB 3.375 g     3.375 g 100 mL/hr over 30 Minutes Intravenous  Once 08/05/15 1645 08/05/15 1829   08/05/15 1700  vancomycin (VANCOCIN) IVPB 1000 mg/200 mL premix     1,000 mg 200 mL/hr over 60 Minutes Intravenous  Once 08/05/15 1645 08/05/15 1929      Assessment: 49yo quadriplegic male who was just here in July for HCAP presents to the ED with sepsis 2/2 to his sacral pressure ulcer.  Previously on vanc/zosyn with large bump in SCr and continued supratherapeutic levels of vanc. Pharmacy consulted to start imipenem - patient has history of seizures. Switch to meropenem per Dr. Erlinda Hong. Tmax/24h 100.6, wbc wnl on 9/9. SCr trending down 5.56>>5.84>5.78>4.58, CrCl~20  8/26 Vanc>>9/2 (On hold -  stopped 9/7)  8/26 Zosyn 3.375 Q8H>>8/30 Restart 9/1>9/5  8/31 Cipro/Flagyl po>9/1  9/7 meropenem>>  Cultures: 9/7 BCx: 2/2 NGTD 8/26 urine>>NG 8/26 BC X 2 >> coag neg staph 1/2 (S-pending) 8/26 MRSA PCR Neg 8/29 BC x 2 >NGTD 8/28 Wound cx > GNR, GPC->pseudomonas resistant to Cipro and providencia (both likely contaminant per ID)  Plan: Continue meropenem 500mg  IV q12h  Monitor renal function Mon clinical progress, c/s, abx plan   Joya San, PharmD Clinical Pharmacy Resident Pager # [phone] 08/20/2015 10:33 AM
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
## 2015-08-20 NOTE — Progress Notes (Signed)
Sacral wound dressing due to be changed at this time but patient requested to be done later in the morning.

## 2015-08-21 LAB — RENAL FUNCTION PANEL
ANION GAP: 12 (ref 5–15)
Albumin: 2.2 g/dL — ABNORMAL LOW (ref 3.5–5.0)
BUN: 41 mg/dL — AB (ref 6–20)
CHLORIDE: 98 mmol/L — AB (ref 101–111)
CO2: 27 mmol/L (ref 22–32)
Calcium: 9.8 mg/dL (ref 8.9–10.3)
Creatinine, Ser: 3.38 mg/dL — ABNORMAL HIGH (ref 0.61–1.24)
GFR calc Af Amer: 24 mL/min — ABNORMAL LOW (ref >60)
GFR, EST NON AFRICAN AMERICAN: 20 mL/min — AB (ref >60)
Glucose, Bld: 88 mg/dL (ref 65–99)
POTASSIUM: 4 mmol/L (ref 3.5–5.1)
Phosphorus: 6.4 mg/dL — ABNORMAL HIGH (ref 2.5–4.6)
Sodium: 137 mmol/L (ref 135–145)
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
## 2015-08-22 ENCOUNTER — Inpatient Hospital Stay (HOSPITAL_COMMUNITY): Payer: Medicaid Other

## 2015-08-22 ENCOUNTER — Other Ambulatory Visit: Payer: Self-pay | Admitting: Plastic Surgery

## 2015-08-22 DIAGNOSIS — L89154 Pressure ulcer of sacral region, stage 4: Secondary | ICD-10-CM

## 2015-08-22 LAB — RENAL FUNCTION PANEL
ALBUMIN: 2.2 g/dL — AB (ref 3.5–5.0)
Anion gap: 10 (ref 5–15)
BUN: 34 mg/dL — AB (ref 6–20)
CO2: 28 mmol/L (ref 22–32)
Calcium: 9.3 mg/dL (ref 8.9–10.3)
Chloride: 98 mmol/L — ABNORMAL LOW (ref 101–111)
Creatinine, Ser: 1.9 mg/dL — ABNORMAL HIGH (ref 0.61–1.24)
GFR calc Af Amer: 47 mL/min — ABNORMAL LOW (ref >60)
GFR calc non Af Amer: 41 mL/min — ABNORMAL LOW (ref >60)
GLUCOSE: 83 mg/dL (ref 65–99)
PHOSPHORUS: 5.1 mg/dL — AB (ref 2.5–4.6)
POTASSIUM: 3.8 mmol/L (ref 3.5–5.1)
Sodium: 136 mmol/L (ref 135–145)

## 2015-08-22 LAB — CULTURE, BLOOD (ROUTINE X 2)
CULTURE: NO GROWTH
CULTURE: NO GROWTH

## 2015-08-22 MED ORDER — SODIUM CHLORIDE 0.9 % IV BOLUS (SEPSIS)
500.0000 mL | Freq: Once | INTRAVENOUS | Status: AC
  Administered 2015-08-22: 500 mL via INTRAVENOUS

## 2015-08-22 MED ORDER — IBUPROFEN 400 MG PO TABS
400.0000 mg | ORAL_TABLET | Freq: Once | ORAL | Status: AC
  Administered 2015-08-22: 400 mg via ORAL
  Filled 2015-08-22: qty 1

## 2015-08-22 MED ORDER — ENOXAPARIN SODIUM 40 MG/0.4ML ~~LOC~~ SOLN
40.0000 mg | SUBCUTANEOUS | Status: DC
  Administered 2015-08-22 – 2015-08-25 (×4): 40 mg via SUBCUTANEOUS
  Filled 2015-08-22 (×4): qty 0.4
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
## 2015-08-23 LAB — URINALYSIS, ROUTINE W REFLEX MICROSCOPIC
Bilirubin Urine: NEGATIVE
Glucose, UA: NEGATIVE mg/dL
Hgb urine dipstick: NEGATIVE
Ketones, ur: NEGATIVE mg/dL
Leukocytes, UA: NEGATIVE
Nitrite: NEGATIVE
Protein, ur: NEGATIVE mg/dL
Specific Gravity, Urine: 1.009 (ref 1.005–1.030)
Urobilinogen, UA: 0.2 mg/dL (ref 0.0–1.0)
pH: 7.5 (ref 5.0–8.0)

## 2015-08-23 LAB — RENAL FUNCTION PANEL
Albumin: 2.1 g/dL — ABNORMAL LOW (ref 3.5–5.0)
Anion gap: 11 (ref 5–15)
BUN: 32 mg/dL — AB (ref 6–20)
CHLORIDE: 97 mmol/L — AB (ref 101–111)
CO2: 29 mmol/L (ref 22–32)
CREATININE: 1.69 mg/dL — AB (ref 0.61–1.24)
Calcium: 9.5 mg/dL (ref 8.9–10.3)
GFR calc Af Amer: 54 mL/min — ABNORMAL LOW (ref >60)
GFR, EST NON AFRICAN AMERICAN: 47 mL/min — AB (ref >60)
GLUCOSE: 89 mg/dL (ref 65–99)
POTASSIUM: 3.5 mmol/L (ref 3.5–5.1)
Phosphorus: 5.3 mg/dL — ABNORMAL HIGH (ref 2.5–4.6)
Sodium: 137 mmol/L (ref 135–145)

## 2015-08-23 LAB — CBC
HCT: 27.6 % — ABNORMAL LOW (ref 39.0–52.0)
Hemoglobin: 8.5 g/dL — ABNORMAL LOW (ref 13.0–17.0)
MCH: 25.8 pg — ABNORMAL LOW (ref 26.0–34.0)
MCHC: 30.8 g/dL (ref 30.0–36.0)
MCV: 83.9 fL (ref 78.0–100.0)
Platelets: 672 10*3/uL — ABNORMAL HIGH (ref 150–400)
RBC: 3.29 MIL/uL — ABNORMAL LOW (ref 4.22–5.81)
RDW: 15.9 % — ABNORMAL HIGH (ref 11.5–15.5)
WBC: 8.8 10*3/uL (ref 4.0–10.5)

## 2015-08-23 LAB — VANCOMYCIN, RANDOM: Vancomycin Rm: 7 ug/mL

## 2015-08-23 MED ORDER — SODIUM CHLORIDE 0.9 % IV SOLN
6.0000 mg/kg | INTRAVENOUS | Status: DC
  Administered 2015-08-23: 431.5 mg via INTRAVENOUS
  Filled 2015-08-23 (×2): qty 8.63

## 2015-08-23 MED ORDER — BOOST / RESOURCE BREEZE PO LIQD: 1.0000 | ORAL | Status: DC

## 2015-08-23 MED ORDER — SODIUM CHLORIDE 0.9 % IV SOLN
1.0000 g | Freq: Three times a day (TID) | INTRAVENOUS | Status: DC
  Administered 2015-08-23 – 2015-08-26 (×10): 1 g via INTRAVENOUS
  Filled 2015-08-23 (×12): qty 1

## 2015-08-23 MED ORDER — ENSURE ENLIVE PO LIQD
237.0000 mL | Freq: Two times a day (BID) | ORAL | Status: DC
  Administered 2015-08-24 – 2015-08-26 (×4): 237 mL via ORAL

## 2015-08-23 NOTE — Progress Notes (Signed)
ANTIBIOTIC CONSULT NOTE - FOLLOW UP  Pharmacy Consult for Meropenem, add Daptomycin Indication:  Wound infection  No Known Allergies  Patient Measurements: Height: 5\' 9"  (175.3 cm) Weight: 158 lb 8 oz (71.895 kg) IBW/kg (Calculated) : 70.7  Vital Signs: Temp: 97.5 F (36.4 C) (09/13 0502) Temp Source: Oral (09/13 0502) BP: 109/73 mmHg (09/13 0910) Pulse Rate: 87 (09/13 0910) Intake/Output from previous day: 09/12 0701 - 09/13 0700 In: 1012 [P.O.:1002; I.V.:10] Out: 2600 [Urine:2600] Intake/Output from this shift: Total I/O In: 360 [P.O.:360] Out: -   Labs:  Recent Labs  08/21/15 0407 08/22/15 0621 08/22/15 2325 08/23/15 0506  WBC  --   --  8.8  --   HGB  --   --  8.5*  --   PLT  --   --  672*  --   CREATININE 3.38* 1.90*  --  1.69*   Estimated Creatinine Clearance: 54.6 mL/min (by C-G formula based on Cr of 1.69).  Recent Labs  08/23/15 0506  [facility] 7     Microbiology: Recent Results (from the past 720 hour(s))  Urine culture     Status: None   Collection Time: 08/05/15  5:23 PM  Result Value Ref Range Status   Specimen Description URINE, RANDOM  Final   Special Requests NONE  Final   Culture NO GROWTH 1 DAY  Final   Report Status 08/06/2015 FINAL  Final  Blood Culture (routine x 2)     Status: None   Collection Time: 08/05/15  5:34 PM  Result Value Ref Range Status   Specimen Description BLOOD RIGHT HAND  Final   Special Requests BOTTLES DRAWN AEROBIC AND ANAEROBIC 5CC  Final   Culture  Setup Time   Final    GRAM POSITIVE COCCI IN CLUSTERS AEROBIC BOTTLE ONLY CRITICAL RESULT CALLED TO, READ BACK BY AND VERIFIED WITH: V SMITH,RN AT 1640 08/07/15 BY L BENFIELD    Culture   Final    STAPHYLOCOCCUS SPECIES (COAGULASE NEGATIVE) THE SIGNIFICANCE OF ISOLATING THIS ORGANISM FROM A SINGLE SET OF BLOOD CULTURES WHEN MULTIPLE SETS ARE DRAWN IS UNCERTAIN. PLEASE NOTIFY THE MICROBIOLOGY DEPARTMENT WITHIN ONE WEEK IF SPECIATION AND SENSITIVITIES ARE  REQUIRED.    Report Status 08/13/2015 FINAL  Final  Blood Culture (routine x 2)     Status: None   Collection Time: 08/05/15  5:40 PM  Result Value Ref Range Status   Specimen Description BLOOD LEFT HAND  Final   Special Requests BOTTLES DRAWN AEROBIC AND ANAEROBIC 5CC  Final   Culture NO GROWTH 5 DAYS  Final   Report Status 08/10/2015 FINAL  Final  MRSA PCR Screening     Status: None   Collection Time: 08/05/15 10:18 PM  Result Value Ref Range Status   MRSA by PCR NEGATIVE NEGATIVE Final    Comment:        The GeneXpert MRSA Assay (FDA approved for NASAL specimens only), is one component of a comprehensive MRSA colonization surveillance program. It is not intended to diagnose MRSA infection nor to guide or monitor treatment for MRSA infections.   Wound culture     Status: None   Collection Time: 08/07/15  7:25 PM  Result Value Ref Range Status   Specimen Description SACRAL  Final   Special Requests NONE  Final   Gram Stain   Final    RARE WBC PRESENT, PREDOMINANTLY PMN RARE SQUAMOUS EPITHELIAL CELLS PRESENT ABUNDANT GRAM NEGATIVE RODS FEW GRAM POSITIVE COCCI IN PAIRS Performed at [facility]    Culture   Final    ABUNDANT PSEUDOMONAS AERUGINOSA PROVIDENCIA STUARTII Performed at [facility]    Report Status 08/11/2015 FINAL  Final   Organism ID, Bacteria PSEUDOMONAS AERUGINOSA  Final   Organism ID, Bacteria PROVIDENCIA STUARTII  Final      Susceptibility   Pseudomonas aeruginosa - MIC*    CEFEPIME 2 SENSITIVE Sensitive     CEFTAZIDIME 4 SENSITIVE Sensitive     CIPROFLOXACIN 1 SENSITIVE Sensitive     GENTAMICIN <=1 SENSITIVE Sensitive     IMIPENEM <=0.25 SENSITIVE Sensitive     PIP/TAZO 8 SENSITIVE Sensitive     TOBRAMYCIN <=1 SENSITIVE Sensitive     * ABUNDANT PSEUDOMONAS AERUGINOSA   Providencia stuartii - MIC*    AMPICILLIN RESISTANT      AMPICILLIN/SULBACTAM 16 INTERMEDIATE Intermediate     CEFAZOLIN >=64 RESISTANT Resistant     CEFEPIME  <=1 SENSITIVE Sensitive     CEFTAZIDIME <=1 SENSITIVE Sensitive     CEFTRIAXONE <=1 SENSITIVE Sensitive     CIPROFLOXACIN >=4 RESISTANT Resistant     GENTAMICIN RESISTANT      IMIPENEM 1 SENSITIVE Sensitive     PIP/TAZO <=4 SENSITIVE Sensitive     TOBRAMYCIN RESISTANT      TRIMETH/SULFA 160 RESISTANT Resistant     * PROVIDENCIA STUARTII  Culture, blood (routine x 2)     Status: None   Collection Time: 08/08/15  9:10 AM  Result Value Ref Range Status   Specimen Description BLOOD LEFT HAND  Final   Special Requests BOTTLES DRAWN AEROBIC AND ANAEROBIC 5CC  Final   Culture NO GROWTH 5 DAYS  Final   Report Status 08/13/2015 FINAL  Final  Culture, blood (routine x 2)     Status: None   Collection Time: 08/08/15  9:18 AM  Result Value Ref Range Status   Specimen Description BLOOD RIGHT HAND  Final   Special Requests BOTTLES DRAWN AEROBIC AND ANAEROBIC 5CC  Final   Culture NO GROWTH 5 DAYS  Final   Report Status 08/13/2015 FINAL  Final  Culture, blood (routine x 2)     Status: None   Collection Time: 08/17/15  9:54 AM  Result Value Ref Range Status   Specimen Description BLOOD LEFT HAND  Final   Special Requests BOTTLES DRAWN AEROBIC AND ANAEROBIC 10CC  Final   Culture NO GROWTH 5 DAYS  Final   Report Status 08/22/2015 FINAL  Final  Culture, blood (routine x 2)     Status: None   Collection Time: 08/17/15 10:04 AM  Result Value Ref Range Status   Specimen Description BLOOD RIGHT HAND  Final   Special Requests BOTTLES DRAWN AEROBIC ONLY 10CC  Final   Culture NO GROWTH 5 DAYS  Final   Report Status 08/22/2015 FINAL  Final    Anti-infectives    Start     Dose/Rate Route Frequency Ordered Stop   08/17/15 1545  meropenem (MERREM) 500 mg in sodium chloride 0.9 % 50 mL IVPB     500 mg 100 mL/hr over 30 Minutes Intravenous Every 12 hours 08/17/15 1533     08/14/15 1415  piperacillin-tazobactam (ZOSYN) IVPB 2.25 g  Status:  Discontinued     2.25 g 100 mL/hr over 30 Minutes Intravenous  Every 8 hours 08/14/15 1000 08/15/15 1505   08/11/15 1415  piperacillin-tazobactam (ZOSYN) IVPB 3.375 g  Status:  Discontinued     3.375 g 12.5 mL/hr over 240 Minutes Intravenous Every 8 hours 08/11/15  1404 08/14/15 1000   08/09/15 1500  ciprofloxacin (CIPRO) tablet 750 mg  Status:  Discontinued     750 mg Oral Every 12 hours 08/09/15 1447 08/11/15 1353   08/09/15 1500  metroNIDAZOLE (FLAGYL) tablet 500 mg  Status:  Discontinued     500 mg Oral 3 times per day 08/09/15 1447 08/11/15 1353   08/06/15 0900  vancomycin (VANCOCIN) 1,500 mg in sodium chloride 0.9 % 500 mL IVPB  Status:  Discontinued     1,500 mg 250 mL/hr over 120 Minutes Intravenous Every 12 hours 08/05/15 2024 08/12/15 2142   08/06/15 0200  vancomycin (VANCOCIN) IVPB 750 mg/150 ml premix  Status:  Discontinued     750 mg 150 mL/hr over 60 Minutes Intravenous Every 8 hours 08/05/15 1914 08/05/15 2011   08/06/15 0130  piperacillin-tazobactam (ZOSYN) IVPB 3.375 g  Status:  Discontinued     3.375 g 12.5 mL/hr over 240 Minutes Intravenous 3 times per day 08/05/15 1914 08/09/15 1447   08/05/15 2100  vancomycin (VANCOCIN) 500 mg in sodium chloride 0.9 % 100 mL IVPB     500 mg 100 mL/hr over 60 Minutes Intravenous  Once 08/05/15 2024 08/06/15 0153   08/05/15 1700  piperacillin-tazobactam (ZOSYN) IVPB 3.375 g     3.375 g 100 mL/hr over 30 Minutes Intravenous  Once 08/05/15 1645 08/05/15 1829   08/05/15 1700  vancomycin (VANCOCIN) IVPB 1000 mg/200 mL premix     1,000 mg 200 mL/hr over 60 Minutes Intravenous  Once 08/05/15 1645 08/05/15 1929      Assessment: 49yo quadriplegic male who was just here in July for HCAP presents to the ED with sepsis 2/2 to his sacral pressure ulcer.  Previously on vanc/zosyn with large bump in SCr and continued supratherapeutic levels of vanc. Vanc level today = 7, will start Daptomycin per ID recs.  Pharmacy consulted to start imipenem - patient has history of seizures. Switch to meropenem per Dr.  Erlinda Hong. Renal function continues to improve.  Will adjust meropenem dose.  8/26 Vanc>>9/2 (On hold - stopped 9/7)  8/26 Zosyn 3.375 Q8H>>8/30 Restart 9/1>9/5  8/31 Cipro/Flagyl po>9/1  9/7 meropenem >> 9/13 dapto >>  Cultures:  9/12 BCx: pending 9/7 BCx: 2/2 NGTD 8/26 urine>>NG 8/26 BC X 2 >> coag neg staph 1/2 (S-pending) 8/26 MRSA PCR Neg 8/29 BC x 2 >NGTD 8/28 Wound cx > GNR, GPC->pseudomonas resistant to Cipro and providencia (both likely contaminant per ID)  Plan: Daptomycin 6mg /kg IV q24h - ordered weekly CPK Change Meropenem to 1gm IV q8h  Monitor renal function Mon clinical progress, c/s, abx plan  [employer], Pharm.D., BCPS Clinical Pharmacist Pager [phone] 08/23/2015 11:13 AM
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
## 2015-08-23 NOTE — Progress Notes (Signed)
OT Cancellation Note  Patient Details Name: Phillip [name] MRN: [id number] DOB: 22-Dec-1965   Cancelled Treatment:    Reason Eval/Treat Not Completed: Other (comment) (nursing assisting with dressing changes). Family standing outside of room and stating nursing staff in room assisting with dressing changes. Family informed this therapist that patient need to be bathed. Therapist encouraged family to ask staff for assistance and advocate for patient as needed and appropriate. Will continue to follow acutely and check back later, possibly tomorrow.   Jehiel Koepp , MS, OTR/L, CLT Pager: [phone]  08/23/2015, 3:12 PM

## 2015-08-24 ENCOUNTER — Encounter (HOSPITAL_COMMUNITY): Payer: Self-pay | Admitting: Anesthesiology

## 2015-08-24 ENCOUNTER — Encounter (HOSPITAL_COMMUNITY)
Admission: EM | Disposition: A | Discharge status: Skilled Nursing Facility | Payer: Self-pay | Source: Home / Self Care | Attending: Internal Medicine

## 2015-08-24 ENCOUNTER — Inpatient Hospital Stay (HOSPITAL_COMMUNITY): Payer: Medicaid Other | Admitting: Anesthesiology

## 2015-08-24 HISTORY — PX: INCISION AND DRAINAGE OF WOUND: SHX1803

## 2015-08-24 HISTORY — PX: APPLICATION OF A-CELL OF CHEST/ABDOMEN: SHX6302

## 2015-08-24 LAB — RENAL FUNCTION PANEL
ANION GAP: 9 (ref 5–15)
Albumin: 2.2 g/dL — ABNORMAL LOW (ref 3.5–5.0)
BUN: 27 mg/dL — ABNORMAL HIGH (ref 6–20)
CHLORIDE: 99 mmol/L — AB (ref 101–111)
CO2: 29 mmol/L (ref 22–32)
CREATININE: 1.14 mg/dL (ref 0.61–1.24)
Calcium: 9.4 mg/dL (ref 8.9–10.3)
GFR calc non Af Amer: >60 mL/min (ref >60)
Glucose, Bld: 85 mg/dL (ref 65–99)
Phosphorus: 3.9 mg/dL (ref 2.5–4.6)
Potassium: 4 mmol/L (ref 3.5–5.1)
Sodium: 137 mmol/L (ref 135–145)

## 2015-08-24 LAB — CK: Total CK: 50 U/L (ref 49–397)

## 2015-08-24 SURGERY — IRRIGATION AND DEBRIDEMENT WOUND
Anesthesia: General

## 2015-08-24 MED ORDER — CEFAZOLIN SODIUM-DEXTROSE 2-3 GM-% IV SOLR: 2.0000 g | INTRAVENOUS | Status: DC

## 2015-08-24 MED ORDER — LACTATED RINGERS IV SOLN
INTRAVENOUS | Status: DC
  Administered 2015-08-24: 11:00:00 via INTRAVENOUS
  Administered 2015-08-24: 10 mL/h via INTRAVENOUS

## 2015-08-24 MED ORDER — SODIUM CHLORIDE 0.9 % IV SOLN
500.0000 mg | INTRAVENOUS | Status: DC
  Administered 2015-08-24 – 2015-08-26 (×3): 500 mg via INTRAVENOUS
  Filled 2015-08-24 (×3): qty 10

## 2015-08-24 MED ORDER — LIDOCAINE HCL (CARDIAC) 20 MG/ML IV SOLN
INTRAVENOUS | Status: DC | PRN
  Administered 2015-08-24: 100 mg via INTRAVENOUS

## 2015-08-24 MED ORDER — SODIUM CHLORIDE 0.9 % IR SOLN
Status: DC | PRN
  Administered 2015-08-24: 1000 mL

## 2015-08-24 MED ORDER — MIDAZOLAM HCL 2 MG/2ML IJ SOLN
INTRAMUSCULAR | Status: AC
  Filled 2015-08-24: qty 4

## 2015-08-24 MED ORDER — SODIUM CHLORIDE 0.9 % IR SOLN
Status: DC | PRN
  Administered 2015-08-24: 500 mL

## 2015-08-24 MED ORDER — ARTIFICIAL TEARS OP OINT
TOPICAL_OINTMENT | OPHTHALMIC | Status: DC | PRN
  Administered 2015-08-24: 1 via OPHTHALMIC

## 2015-08-24 MED ORDER — MIDAZOLAM HCL 5 MG/5ML IJ SOLN
INTRAMUSCULAR | Status: DC | PRN
  Administered 2015-08-24: 2 mg via INTRAVENOUS

## 2015-08-24 MED ORDER — SURGILUBE EX GEL
CUTANEOUS | Status: DC | PRN
  Administered 2015-08-24: 1 via TOPICAL

## 2015-08-24 MED ORDER — PROPOFOL 10 MG/ML IV BOLUS
INTRAVENOUS | Status: AC
  Filled 2015-08-24: qty 20

## 2015-08-24 MED ORDER — HYDROMORPHONE HCL 1 MG/ML IJ SOLN: 0.2500 mg | INTRAMUSCULAR | Status: DC | PRN

## 2015-08-24 MED ORDER — FENTANYL CITRATE (PF) 250 MCG/5ML IJ SOLN
INTRAMUSCULAR | Status: AC
  Filled 2015-08-24: qty 5

## 2015-08-24 MED ORDER — PROPOFOL 10 MG/ML IV BOLUS
INTRAVENOUS | Status: DC | PRN
  Administered 2015-08-24: 90 mg via INTRAVENOUS

## 2015-08-24 MED ORDER — PHENYLEPHRINE HCL 10 MG/ML IJ SOLN
INTRAMUSCULAR | Status: DC | PRN
  Administered 2015-08-24 (×3): 40 ug via INTRAVENOUS

## 2015-08-24 MED ORDER — SODIUM CHLORIDE 0.9 % IJ SOLN
10.0000 mL | INTRAMUSCULAR | Status: DC | PRN
  Administered 2015-08-24 – 2015-08-26 (×2): 10 mL
  Filled 2015-08-24: qty 40

## 2015-08-24 MED ORDER — ONDANSETRON HCL 4 MG/2ML IJ SOLN
INTRAMUSCULAR | Status: DC | PRN
  Administered 2015-08-24: 4 mg via INTRAVENOUS

## 2015-08-24 MED ORDER — PROMETHAZINE HCL 25 MG/ML IJ SOLN: 6.2500 mg | INTRAMUSCULAR | Status: DC | PRN

## 2015-08-24 MED ORDER — FENTANYL CITRATE (PF) 100 MCG/2ML IJ SOLN
INTRAMUSCULAR | Status: DC | PRN
  Administered 2015-08-24: 75 ug via INTRAVENOUS

## 2015-08-24 MED ORDER — CEFAZOLIN SODIUM-DEXTROSE 2-3 GM-% IV SOLR
INTRAVENOUS | Status: AC
  Administered 2015-08-24: 2 g via INTRAVENOUS
  Filled 2015-08-24: qty 50

## 2015-08-24 SURGICAL SUPPLY — 53 items
APL SKNCLS STERI-STRIP NONHPOA (GAUZE/BANDAGES/DRESSINGS) ×1
BAG DECANTER FOR FLEXI CONT (MISCELLANEOUS) IMPLANT
BENZOIN TINCTURE PRP APPL 2/3 (GAUZE/BANDAGES/DRESSINGS) ×3 IMPLANT
BLADE 10 SAFETY STRL DISP (BLADE) ×3 IMPLANT
CANISTER SUCTION 2500CC (MISCELLANEOUS) ×3 IMPLANT
CONT SPEC STER OR (MISCELLANEOUS) IMPLANT
COVER SURGICAL LIGHT HANDLE (MISCELLANEOUS) ×3 IMPLANT
DRAPE IMP U-DRAPE 54X76 (DRAPES) ×3 IMPLANT
DRAPE INCISE IOBAN 66X45 STRL (DRAPES) ×2 IMPLANT
DRAPE LAPAROSCOPIC ABDOMINAL (DRAPES) IMPLANT
DRAPE PED LAPAROTOMY (DRAPES) ×3 IMPLANT
DRAPE PROXIMA HALF (DRAPES) IMPLANT
DRSG ADAPTIC 3X8 NADH LF (GAUZE/BANDAGES/DRESSINGS) ×4 IMPLANT
DRSG EMULSION OIL 3X3 NADH (GAUZE/BANDAGES/DRESSINGS) IMPLANT
DRSG PAD ABDOMINAL 8X10 ST (GAUZE/BANDAGES/DRESSINGS) IMPLANT
DRSG VAC ATS LRG SENSATRAC (GAUZE/BANDAGES/DRESSINGS) ×2 IMPLANT
DRSG VAC ATS MED SENSATRAC (GAUZE/BANDAGES/DRESSINGS) IMPLANT
DRSG VAC ATS SM SENSATRAC (GAUZE/BANDAGES/DRESSINGS) IMPLANT
ELECT CAUTERY BLADE 6.4 (BLADE) ×3 IMPLANT
ELECT REM PT RETURN 9FT ADLT (ELECTROSURGICAL) ×3
ELECTRODE REM PT RTRN 9FT ADLT (ELECTROSURGICAL) ×1 IMPLANT
GAUZE SPONGE 4X4 12PLY STRL (GAUZE/BANDAGES/DRESSINGS) IMPLANT
GAUZE SPONGE 4X4 16PLY XRAY LF (GAUZE/BANDAGES/DRESSINGS) ×3 IMPLANT
GLOVE BIO SURGEON STRL SZ 6.5 (GLOVE) ×2 IMPLANT
GLOVE BIO SURGEONS STRL SZ 6.5 (GLOVE) ×1
GOWN STRL REUS W/ TWL LRG LVL3 (GOWN DISPOSABLE) ×3 IMPLANT
GOWN STRL REUS W/TWL LRG LVL3 (GOWN DISPOSABLE) ×9
KIT BASIN OR (CUSTOM PROCEDURE TRAY) ×3 IMPLANT
KIT ROOM TURNOVER OR (KITS) ×3 IMPLANT
MATRIX SURGICAL PSM 10X15CM (Tissue) ×2 IMPLANT
MICROMATRIX 1000MG (Tissue) ×9 IMPLANT
NS IRRIG 1000ML POUR BTL (IV SOLUTION) ×3 IMPLANT
PACK GENERAL/GYN (CUSTOM PROCEDURE TRAY) ×3 IMPLANT
PACK SURGICAL SETUP 50X90 (CUSTOM PROCEDURE TRAY) ×3 IMPLANT
PACK UNIVERSAL I (CUSTOM PROCEDURE TRAY) ×3 IMPLANT
PAD ARMBOARD 7.5X6 YLW CONV (MISCELLANEOUS) ×6 IMPLANT
PAD NEG PRESSURE SENSATRAC (MISCELLANEOUS) ×2 IMPLANT
PENCIL BUTTON HOLSTER BLD 10FT (ELECTRODE) ×3 IMPLANT
SOLUTION PARTIC MCRMTRX 1000MG (Tissue) IMPLANT
STAPLER VISISTAT 35W (STAPLE) ×3 IMPLANT
SURGILUBE 2OZ TUBE FLIPTOP (MISCELLANEOUS) ×2 IMPLANT
SUT CHROMIC 4 0 P 3 18 (SUTURE) IMPLANT
SUT ETHILON 4 0 PS 2 18 (SUTURE) IMPLANT
SUT ETHILON 5 0 P 3 18 (SUTURE)
SUT NYLON ETHILON 5-0 P-3 1X18 (SUTURE) IMPLANT
SUT SILK 3 0 SH 30 (SUTURE) ×4 IMPLANT
SUT VIC AB 5-0 PS2 18 (SUTURE) ×4 IMPLANT
SWAB COLLECTION DEVICE MRSA (MISCELLANEOUS) IMPLANT
SYR BULB 3OZ (MISCELLANEOUS) IMPLANT
TOWEL OR 17X24 6PK STRL BLUE (TOWEL DISPOSABLE) ×3 IMPLANT
TOWEL OR 17X26 10 PK STRL BLUE (TOWEL DISPOSABLE) ×3 IMPLANT
TUBE ANAEROBIC SPECIMEN COL (MISCELLANEOUS) IMPLANT
UNDERPAD 30X30 INCONTINENT (UNDERPADS AND DIAPERS) ×3 IMPLANT
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
## 2015-08-24 NOTE — Op Note (Signed)
Operative Note   DATE OF OPERATION: 08/24/2015  LOCATION: [facility]  SURGICAL DIVISION: Plastic Surgery  PREOPERATIVE DIAGNOSES:  Sacral ulcer stage IV  POSTOPERATIVE DIAGNOSES:  same  PROCEDURE:  Preparation of sacral ulcer 15 x 15 x 5 cm for placement of Acell (powder 3 gm and sheet 10 x 15 cm) after debridement of skin and muscle.  SURGEON: Theodoro Kos, DO  ASSISTANT: Shawn Rayburn, PA  ANESTHESIA:  General.   COMPLICATIONS: None.   INDICATIONS FOR PROCEDURE:  The patient, Phillip [name] is a 49 y.o. male born on 08/07/1966, is here for treatment of a stage IV sacral ulcer. MRN: [id number]  CONSENT:  Informed consent was obtained directly from the patient. Risks, benefits and alternatives were fully discussed. Specific risks including but not limited to bleeding, infection, hematoma, seroma, scarring, pain, infection, contracture, asymmetry, wound healing problems, and need for further surgery were all discussed. The patient did have an ample opportunity to have questions answered to satisfaction.   DESCRIPTION OF PROCEDURE:  The patient was taken to the operating room. SCDs were placed and IV antibiotics were given. The patient's operative site was prepped and draped in a sterile fashion. A time out was performed and all information was confirmed to be correct.  General anesthesia was administered.  The area was irrigated with antibiotic solution.  The #10 blade was used to debride sharply the skin and nonviable muscle of the 15 x 15 cm wound.  Hemostasis was achieved with electrocautery.  All of the Acell powder and sheet was utilized and secured with 5-0 Vicryl.  The adaptic and surgical lube was applied and the VAC secured.  The patient tolerated the procedure well.  There were no complications. The patient was allowed to wake from anesthesia, extubated and taken to the recovery room in satisfactory condition.

## 2015-08-24 NOTE — Transfer of Care (Signed)
Immediate Anesthesia Transfer of Care Note  Patient: Phillip [name]  Procedure(s) Performed: Procedure(s): IRRIGATION AND DEBRIDEMENT SACRAL DECUBITUS ULCER  (N/A) PLACEMENT OF A-CELL ANd Wound  VAC (N/A)  Patient Location: PACU  Anesthesia Type:General  Level of Consciousness: awake and alert   Airway & Oxygen Therapy: Patient Spontanous Breathing and Patient connected to nasal cannula oxygen  Post-op Assessment: Report given to RN  Post vital signs: Reviewed and stable  Last Vitals:  Filed Vitals:   08/24/15 0658  BP: 133/82  Pulse: 107  Temp: 37.6 C  Resp: 16    Complications: No apparent anesthesia complications

## 2015-08-24 NOTE — Anesthesia Procedure Notes (Signed)
Procedure Name: Intubation Date/Time: 08/24/2015 10:59 AM Performed by: Suzy Bouchard Pre-anesthesia Checklist: Patient identified, Timeout performed, Emergency Drugs available, Patient being monitored and Suction available Patient Re-evaluated:Patient Re-evaluated prior to inductionOxygen Delivery Method: Circle system utilized Preoxygenation: Pre-oxygenation with 100% oxygen Intubation Type: IV induction Ventilation: Mask ventilation without difficulty Laryngoscope Size: Miller and 2 Grade View: Grade I Tube type: Oral Tube size: 7.5 mm Number of attempts: 1 Airway Equipment and Method: Stylet Secured at: 22 cm Tube secured with: Tape Dental Injury: Teeth and Oropharynx as per pre-operative assessment

## 2015-08-24 NOTE — Brief Op Note (Signed)
08/05/2015 - 08/24/2015  11:44 AM  PATIENT:  Phillip [name]  49 y.o. male  PRE-OPERATIVE DIAGNOSIS:  SACRAL DECUBITUS ULCER   POST-OPERATIVE DIAGNOSIS:  sacral decubitus ulcer   PROCEDURE:  Procedure(s): IRRIGATION AND DEBRIDEMENT SACRAL DECUBITUS ULCER  (N/A) PLACEMENT OF A-CELL ANd Wound  VAC (N/A)  SURGEON:  Surgeon(s) and Role:    * Claire Sanger, DO - Primary  PHYSICIAN ASSISTANT: Shawn Rayburn, PA  ASSISTANTS: none   ANESTHESIA:   general  EBL:  Total I/O In: 0  Out: 900 [Urine:900]  BLOOD ADMINISTERED:none  DRAINS: none   LOCAL MEDICATIONS USED:  NONE  SPECIMEN:  No Specimen  DISPOSITION OF SPECIMEN:  N/A  COUNTS:  YES  TOURNIQUET:  * No tourniquets in log *  DICTATION: .Dragon Dictation  PLAN OF CARE: Admit to inpatient   PATIENT DISPOSITION:  PACU - hemodynamically stable.   Delay start of Pharmacological VTE agent (>24hrs) due to surgical blood loss or risk of bleeding: no
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
## 2015-08-24 NOTE — Anesthesia Postprocedure Evaluation (Signed)
Anesthesia Post Note  Patient: Phillip [name]  Procedure(s) Performed: Procedure(s) (LRB): IRRIGATION AND DEBRIDEMENT SACRAL DECUBITUS ULCER  (N/A) PLACEMENT OF A-CELL ANd Wound  VAC (N/A)  Anesthesia type: general  Patient location: PACU  Post pain: Pain level controlled  Post assessment: Patient's Cardiovascular Status Stable  Last Vitals:  Filed Vitals:   08/24/15 1250  BP: 117/72  Pulse: 91  Temp:   Resp: 13    Post vital signs: Reviewed and stable  Level of consciousness: sedated  Complications: No apparent anesthesia complications

## 2015-08-24 NOTE — Clinical Social Work Note (Signed)
CSW has sent updated clinical information to [facility] and has made facility aware that the patient now has a wound vac. CSW will continue to follow and assist with DC as appropriate.  Liz Beach MSW, [place], [place], [phone]
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
## 2015-08-24 NOTE — Anesthesia Preprocedure Evaluation (Addendum)
Anesthesia Evaluation  Patient identified by MRN, date of birth, ID band Patient awake    Reviewed: Allergy & Precautions, NPO status , Patient's Chart, lab work & pertinent test results  Airway Mallampati: II  TM Distance: >3 FB Neck ROM: Full    Dental  (+) Teeth Intact, Dental Advisory Given   Pulmonary Current Smoker,    breath sounds clear to auscultation       Cardiovascular negative cardio ROS   Rhythm:Regular Rate:Normal     Neuro/Psych Seizures -,  negative psych ROS   GI/Hepatic negative GI ROS, Neg liver ROS,   Endo/Other  negative endocrine ROS  Renal/GU Renal InsufficiencyRenal disease     Musculoskeletal Quadriplegic   Abdominal   Peds  Hematology   Anesthesia Other Findings   Reproductive/Obstetrics                           Anesthesia Physical  Anesthesia Plan  ASA: III  Anesthesia Plan: General   Post-op Pain Management:    Induction: Intravenous  Airway Management Planned: Oral ETT  Additional Equipment:   Intra-op Plan:   Post-operative Plan: Extubation in OR  Informed Consent: I have reviewed the patients History and Physical, chart, labs and discussed the procedure including the risks, benefits and alternatives for the proposed anesthesia with the patient or authorized representative who has indicated his/her understanding and acceptance.   Dental advisory given  Plan Discussed with: CRNA and Anesthesiologist  Anesthesia Plan Comments:         Anesthesia Quick Evaluation
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
## 2015-08-24 NOTE — Progress Notes (Signed)
Attempted to call report to short stay surgical services, was told by secretary Baldo Ash that nurse was on another line and would call me back, left my phone number and name with secretary Baldo Ash.

## 2015-08-25 ENCOUNTER — Encounter (HOSPITAL_COMMUNITY): Payer: Self-pay | Admitting: Plastic Surgery

## 2015-08-25 LAB — RENAL FUNCTION PANEL
Albumin: 2.1 g/dL — ABNORMAL LOW (ref 3.5–5.0)
Anion gap: 8 (ref 5–15)
BUN: 18 mg/dL (ref 6–20)
CHLORIDE: 99 mmol/L — AB (ref 101–111)
CO2: 31 mmol/L (ref 22–32)
Calcium: 9.4 mg/dL (ref 8.9–10.3)
Creatinine, Ser: 0.85 mg/dL (ref 0.61–1.24)
GFR calc Af Amer: >60 mL/min (ref >60)
GLUCOSE: 93 mg/dL (ref 65–99)
POTASSIUM: 3.5 mmol/L (ref 3.5–5.1)
Phosphorus: 4 mg/dL (ref 2.5–4.6)
Sodium: 138 mmol/L (ref 135–145)

## 2015-08-25 LAB — CBC
HEMATOCRIT: 26.2 % — AB (ref 39.0–52.0)
Hemoglobin: 7.9 g/dL — ABNORMAL LOW (ref 13.0–17.0)
MCH: 25.5 pg — ABNORMAL LOW (ref 26.0–34.0)
MCHC: 30.2 g/dL (ref 30.0–36.0)
MCV: 84.5 fL (ref 78.0–100.0)
Platelets: 654 10*3/uL — ABNORMAL HIGH (ref 150–400)
RBC: 3.1 MIL/uL — ABNORMAL LOW (ref 4.22–5.81)
RDW: 15.8 % — AB (ref 11.5–15.5)
WBC: 6.3 10*3/uL (ref 4.0–10.5)

## 2015-08-25 LAB — TYPE AND SCREEN
ABO/RH(D): O POS
Antibody Screen: NEGATIVE
UNIT DIVISION: 0

## 2015-08-25 LAB — PREPARE RBC (CROSSMATCH)

## 2015-08-25 MED ORDER — SODIUM CHLORIDE 0.9 % IV SOLN: Freq: Once | INTRAVENOUS | Status: AC

## 2015-08-25 NOTE — Progress Notes (Addendum)
Dr. Migdalia Dk verbal order to not change wound vac for 7 days after surgery (until 08/31/15) and to maintain at a rate of 152mm/hg continous, to follow up at wound care center in two weeks and to not sit up in chair or wheelchair until MD states it is okay. Dr. Migdalia Dk stated she has signed off on patient.

## 2015-08-25 NOTE — Progress Notes (Addendum)
PT Cancellation Note  Patient Details Name: Phillip [name] MRN: [id number] DOB: 19-Jul-1966   Cancelled Treatment:    Reason Eval/Treat Not Completed: Fatigue/lethargy limiting ability to participate.  Pt wanting to rest, RN and RN tech made aware.  Pt ok with PT checking back later.  Addendum: spoke with PT who is a wound care therapist and she did not think that we should be getting pt up out of the bed to sit on his fresh skin graft (makes sense).  PA from plastics paged to get clarification on how long this would be a restriction as PT may have to defer therapy back to SNF if he is going to be on bedrest.    Addendum (13:39): we did get clarification that pt is not to be up OOB on his new graft (that includes sitting EOB).  PT to sign off as our goals were related to sitting EOB, transfers and WC mobility.  This will have to be put on hold for a few weeks until he is allowed by MD to start getting up again.  PT signing off.  Thanks,   Barbarann Ehlers. [place], [place], DPT [phone]   08/25/2015, 12:34 PM

## 2015-08-25 NOTE — Discharge Instructions (Signed)
Do not remove wound vac for 7 days post op (until 08/31/15). Change wound vac every seven days and maintain at 115mm/hg continuous. Do not sit up in chair or wheelchair until cleared by MD, and this can be up to weeks to protect Acell. Follow up in [facility] in two weeks. (Verbal orders from Dr. Migdalia Dk)

## 2015-08-25 NOTE — Progress Notes (Signed)
Went to obtain blood from blood bank. Blood band number on patient did not match the unit of blood in bank. Verified by second RN at bedside. Blood not given. Type and screen reordered.

## 2015-08-25 NOTE — Care Management Note (Signed)
Case Management Note  Patient Details  Name: Phillip [name] MRN: [id number] Date of Birth: Apr 03, 1966  Subjective/Objective:                 Patient from [facility]. Patient received Acell graft to decubitus. Patient will require VAC for transport. Liliane Bade RN with [facility] wound VAC notified at 11:26 of need. Patient will require VAC for 7 post op. Patient to return to [facility]. Also left message with Marlaine Hind [phone] in materials management to notify of need for transport VAC and probable discharge tomorrow.   Action/Plan:   Expected Discharge Date:                  Expected Discharge Plan:  Skilled Nursing Facility  In-House Referral:  Clinical Social Work  Discharge planning Services  CM Consult  Post Acute Care Choice:    Choice offered to:     DME Arranged:    DME Agency:     HH Arranged:    [place] Agency:     Status of Service:  In process, will continue to follow  Medicare Important Message Given:    Date Medicare IM Given:    Medicare IM give by:    Date Additional Medicare IM Given:    Additional Medicare Important Message give by:     If discussed at [place] of Stay Meetings, dates discussed:    Additional Comments:  Carles Collet, RN 08/25/2015, 3:09 PM
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
## 2015-08-26 LAB — RENAL FUNCTION PANEL
ALBUMIN: 2.3 g/dL — AB (ref 3.5–5.0)
ANION GAP: 10 (ref 5–15)
BUN: 14 mg/dL (ref 6–20)
CO2: 30 mmol/L (ref 22–32)
Calcium: 9.6 mg/dL (ref 8.9–10.3)
Chloride: 101 mmol/L (ref 101–111)
Creatinine, Ser: 0.77 mg/dL (ref 0.61–1.24)
GFR calc Af Amer: >60 mL/min (ref >60)
Glucose, Bld: 84 mg/dL (ref 65–99)
PHOSPHORUS: 3.5 mg/dL (ref 2.5–4.6)
POTASSIUM: 3.4 mmol/L — AB (ref 3.5–5.1)
Sodium: 141 mmol/L (ref 135–145)

## 2015-08-26 LAB — TYPE AND SCREEN
ABO/RH(D): O POS
Antibody Screen: NEGATIVE
Unit division: 0

## 2015-08-26 LAB — CBC
HEMATOCRIT: 30.2 % — AB (ref 39.0–52.0)
HEMOGLOBIN: 9.6 g/dL — AB (ref 13.0–17.0)
MCH: 26.9 pg (ref 26.0–34.0)
MCHC: 31.8 g/dL (ref 30.0–36.0)
MCV: 84.6 fL (ref 78.0–100.0)
Platelets: 640 10*3/uL — ABNORMAL HIGH (ref 150–400)
RBC: 3.57 MIL/uL — ABNORMAL LOW (ref 4.22–5.81)
RDW: 15.4 % (ref 11.5–15.5)
WBC: 8.6 10*3/uL (ref 4.0–10.5)

## 2015-08-26 LAB — CREATININE, SERUM
CREATININE: 0.77 mg/dL (ref 0.61–1.24)
GFR calc Af Amer: >60 mL/min (ref >60)
GFR calc non Af Amer: >60 mL/min (ref >60)

## 2015-08-26 MED ORDER — ENSURE ENLIVE PO LIQD: 237.0000 mL | Freq: Two times a day (BID) | ORAL | Status: DC

## 2015-08-26 MED ORDER — FERROUS SULFATE 325 (65 FE) MG PO TABS: 325.0000 mg | ORAL_TABLET | Freq: Two times a day (BID) | ORAL | Status: AC

## 2015-08-26 MED ORDER — OXYCODONE-ACETAMINOPHEN 5-325 MG PO TABS: 1.0000 | ORAL_TABLET | ORAL | Status: DC | PRN

## 2015-08-26 MED ORDER — SODIUM CHLORIDE 0.9 % IV SOLN: 1.0000 g | Freq: Three times a day (TID) | INTRAVENOUS | Status: DC

## 2015-08-26 MED ORDER — SODIUM CHLORIDE 0.9 % IV SOLN: 500.0000 mg | INTRAVENOUS | Status: DC

## 2015-08-26 MED ORDER — HEPARIN SOD (PORK) LOCK FLUSH 100 UNIT/ML IV SOLN
250.0000 [IU] | INTRAVENOUS | Status: AC | PRN
  Administered 2015-08-26: 250 [IU]

## 2015-08-26 MED ORDER — METOPROLOL TARTRATE 25 MG PO TABS: 12.5000 mg | ORAL_TABLET | Freq: Two times a day (BID) | ORAL | Status: AC

## 2015-08-26 MED ORDER — ZINC SULFATE 220 (50 ZN) MG PO CAPS: 220.0000 mg | ORAL_CAPSULE | Freq: Every day | ORAL | Status: AC

## 2015-08-26 MED ORDER — ASCORBIC ACID 500 MG PO TABS: 500.0000 mg | ORAL_TABLET | Freq: Two times a day (BID) | ORAL | Status: AC

## 2015-08-26 MED ORDER — ZOLPIDEM TARTRATE 5 MG PO TABS: 5.0000 mg | ORAL_TABLET | Freq: Every evening | ORAL | Status: DC | PRN

## 2015-08-26 MED ORDER — BOOST / RESOURCE BREEZE PO LIQD: 1.0000 | ORAL | Status: DC

## 2015-08-26 MED ORDER — ONDANSETRON HCL 4 MG PO TABS: 4.0000 mg | ORAL_TABLET | Freq: Four times a day (QID) | ORAL | Status: AC | PRN

## 2015-08-26 NOTE — Progress Notes (Signed)
OT Cancellation Note  Patient Details Name: Phillip [name] MRN: [id number] DOB: 06/17/1966   Cancelled Treatment:    Reason Eval/Treat Not Completed: Patient declined, no reason specified. Pt scheduled to return to SNF this afternoon  Britt Bottom 08/26/2015, 12:49 PM

## 2015-08-26 NOTE — Progress Notes (Signed)
Transport wound vac placed on patient and portable contacted to come and pick up vac.

## 2015-08-26 NOTE — Progress Notes (Signed)
Sent notification to Liliane Bade RN [facility] wound VAC for transport VAC, discharging to [facility] today.

## 2015-08-26 NOTE — Progress Notes (Signed)
Phillip [name] to be D/C'd Skilled nursing facility per MD order.  Discussed with the patient and all questions fully answered.  VSS. Transport wound vac applied at 165mm/hg continuous. PICC line hep locked by IV team.  IV catheter discontinued intact. Site without signs and symptoms of complications. Dressing and pressure applied.   Patient to be escorted via [place], [name] 08/26/2015 2:57 PM

## 2015-08-26 NOTE — Clinical Social Work Note (Signed)
Clinical Social Worker facilitated patient discharge including contacting patient family and facility to confirm patient discharge plans.  Clinical information faxed to facility and family agreeable with plan.  CSW arranged ambulance transport via [facility] to [facility].  RN to call report prior to discharge ([phone]).  Clinical Social Worker will sign off for now as social work intervention is no longer needed. Please consult us again if new need arises.  Jesse Zylah Elsbernd, LCSW [phone] 

## 2015-08-26 NOTE — Progress Notes (Signed)
Report called to [employer].
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
## 2015-08-26 NOTE — Care Management Note (Signed)
Case Management Note  Patient Details  Name: Phillip [name] MRN: [id number] Date of Birth: 06/20/1966  Subjective/Objective:                 Patient from [facility]. Patient received Acell graft to decubitus. Patient will require VAC for transport. Liliane Bade RN with [facility] wound VAC notified at 11:26 of need. Patient will require VAC for 7 post op. Patient to return to [facility]. Also left message with Marlaine Hind [phone] in materials management to notify of need for transport VAC and probable discharge tomorrow.  08-26-15 Sent notification to Liliane Bade wil [facility] wound VACs that patient is to be discharged today and will need wound vac for transport o [facility]. Per Denyse Amass [facility] has wound vac at facility. Patient requiring continuous suction due to A Cell graft.Bedside RN Apolonio Schneiders aware of need for continuous suction and suction at transport    Action/Plan:   Expected Discharge Date:                  Expected Discharge Plan:  [place]  In-House Referral:  Clinical Social Work  Discharge planning Services  CM Consult  Post Acute Care Choice:    Choice offered to:     DME Arranged:    DME Agency:     HH Arranged:    [place] Agency:     Status of Service:  In process, will continue to follow  Medicare Important Message Given:    Date Medicare IM Given:    Medicare IM give by:    Date Additional Medicare IM Given:    Additional Medicare Important Message give by:     If discussed at [place] of Stay Meetings, dates discussed:    Additional Comments:  Carles Collet, RN 08/26/2015, 10:11 AM

## 2015-08-26 NOTE — Progress Notes (Signed)
Transport VAC brought by [employer], set up in room charging. Bedside nurse Apolonio Schneiders, stated she knew how to exchange current VAC to transport VAC. Per Audry Pili, pick up of transport VAC at [facility] is arranged. No further action from CM needed.

## 2015-08-28 LAB — CULTURE, BLOOD (ROUTINE X 2)
Culture: NO GROWTH
Culture: NO GROWTH

## 2015-09-12 ENCOUNTER — Encounter (HOSPITAL_BASED_OUTPATIENT_CLINIC_OR_DEPARTMENT_OTHER): Payer: Managed Care, Other (non HMO) | Attending: Internal Medicine

## 2015-09-12 DIAGNOSIS — G40909 Epilepsy, unspecified, not intractable, without status epilepticus: Secondary | ICD-10-CM | POA: Insufficient documentation

## 2015-09-12 DIAGNOSIS — L89152 Pressure ulcer of sacral region, stage 2: Secondary | ICD-10-CM | POA: Diagnosis not present

## 2015-09-12 DIAGNOSIS — L89890 Pressure ulcer of other site, unstageable: Secondary | ICD-10-CM | POA: Insufficient documentation

## 2015-09-12 DIAGNOSIS — L89522 Pressure ulcer of left ankle, stage 2: Secondary | ICD-10-CM | POA: Diagnosis not present

## 2015-09-12 DIAGNOSIS — F17200 Nicotine dependence, unspecified, uncomplicated: Secondary | ICD-10-CM | POA: Diagnosis not present

## 2015-09-12 DIAGNOSIS — L89610 Pressure ulcer of right heel, unstageable: Secondary | ICD-10-CM | POA: Insufficient documentation

## 2015-09-12 DIAGNOSIS — L89620 Pressure ulcer of left heel, unstageable: Secondary | ICD-10-CM | POA: Diagnosis not present

## 2015-09-12 DIAGNOSIS — L89510 Pressure ulcer of right ankle, unstageable: Secondary | ICD-10-CM | POA: Diagnosis not present

## 2015-09-12 DIAGNOSIS — I10 Essential (primary) hypertension: Secondary | ICD-10-CM | POA: Diagnosis not present

## 2015-09-12 DIAGNOSIS — G8250 Quadriplegia, unspecified: Secondary | ICD-10-CM | POA: Diagnosis not present

## 2015-11-10 ENCOUNTER — Emergency Department (HOSPITAL_COMMUNITY): Payer: Medicaid Other

## 2015-11-10 ENCOUNTER — Inpatient Hospital Stay (HOSPITAL_COMMUNITY)
Admission: EM | Admit: 2015-11-10 | Discharge: 2015-11-15 | DRG: 463 | Disposition: A | Discharge status: Skilled Nursing Facility | Payer: Medicaid Other | Attending: Internal Medicine | Admitting: Internal Medicine

## 2015-11-10 ENCOUNTER — Inpatient Hospital Stay (HOSPITAL_COMMUNITY)
Admission: AD | Admit: 2015-11-10 | Payer: No Typology Code available for payment source | Source: Ambulatory Visit | Admitting: Orthopedic Surgery

## 2015-11-10 ENCOUNTER — Encounter (HOSPITAL_COMMUNITY): Payer: Self-pay | Admitting: *Deleted

## 2015-11-10 DIAGNOSIS — L89154 Pressure ulcer of sacral region, stage 4: Secondary | ICD-10-CM | POA: Diagnosis present

## 2015-11-10 DIAGNOSIS — G8250 Quadriplegia, unspecified: Secondary | ICD-10-CM | POA: Diagnosis present

## 2015-11-10 DIAGNOSIS — L89893 Pressure ulcer of other site, stage 3: Secondary | ICD-10-CM | POA: Diagnosis present

## 2015-11-10 DIAGNOSIS — L89523 Pressure ulcer of left ankle, stage 3: Secondary | ICD-10-CM | POA: Diagnosis present

## 2015-11-10 DIAGNOSIS — L89312 Pressure ulcer of right buttock, stage 2: Secondary | ICD-10-CM | POA: Diagnosis present

## 2015-11-10 DIAGNOSIS — B9562 Methicillin resistant Staphylococcus aureus infection as the cause of diseases classified elsewhere: Secondary | ICD-10-CM | POA: Diagnosis not present

## 2015-11-10 DIAGNOSIS — R6510 Systemic inflammatory response syndrome (SIRS) of non-infectious origin without acute organ dysfunction: Secondary | ICD-10-CM | POA: Diagnosis present

## 2015-11-10 DIAGNOSIS — B965 Pseudomonas (aeruginosa) (mallei) (pseudomallei) as the cause of diseases classified elsewhere: Secondary | ICD-10-CM | POA: Diagnosis present

## 2015-11-10 DIAGNOSIS — M868X7 Other osteomyelitis, ankle and foot: Secondary | ICD-10-CM | POA: Diagnosis present

## 2015-11-10 DIAGNOSIS — D62 Acute posthemorrhagic anemia: Secondary | ICD-10-CM | POA: Diagnosis present

## 2015-11-10 DIAGNOSIS — Z79899 Other long term (current) drug therapy: Secondary | ICD-10-CM | POA: Diagnosis not present

## 2015-11-10 DIAGNOSIS — S90519A Abrasion, unspecified ankle, initial encounter: Secondary | ICD-10-CM | POA: Diagnosis present

## 2015-11-10 DIAGNOSIS — T84624A Infection and inflammatory reaction due to internal fixation device of right fibula, initial encounter: Secondary | ICD-10-CM | POA: Diagnosis present

## 2015-11-10 DIAGNOSIS — S14109A Unspecified injury at unspecified level of cervical spinal cord, initial encounter: Secondary | ICD-10-CM | POA: Diagnosis not present

## 2015-11-10 DIAGNOSIS — B999 Unspecified infectious disease: Secondary | ICD-10-CM

## 2015-11-10 DIAGNOSIS — L89514 Pressure ulcer of right ankle, stage 4: Secondary | ICD-10-CM | POA: Diagnosis present

## 2015-11-10 DIAGNOSIS — F17210 Nicotine dependence, cigarettes, uncomplicated: Secondary | ICD-10-CM | POA: Diagnosis present

## 2015-11-10 DIAGNOSIS — A419 Sepsis, unspecified organism: Secondary | ICD-10-CM | POA: Diagnosis present

## 2015-11-10 DIAGNOSIS — L8990 Pressure ulcer of unspecified site, unspecified stage: Secondary | ICD-10-CM | POA: Diagnosis not present

## 2015-11-10 DIAGNOSIS — L089 Local infection of the skin and subcutaneous tissue, unspecified: Secondary | ICD-10-CM | POA: Diagnosis present

## 2015-11-10 DIAGNOSIS — L89610 Pressure ulcer of right heel, unstageable: Secondary | ICD-10-CM | POA: Diagnosis present

## 2015-11-10 DIAGNOSIS — N390 Urinary tract infection, site not specified: Secondary | ICD-10-CM | POA: Diagnosis present

## 2015-11-10 DIAGNOSIS — B9620 Unspecified Escherichia coli [E. coli] as the cause of diseases classified elsewhere: Secondary | ICD-10-CM | POA: Diagnosis present

## 2015-11-10 DIAGNOSIS — M25571 Pain in right ankle and joints of right foot: Secondary | ICD-10-CM | POA: Diagnosis present

## 2015-11-10 DIAGNOSIS — L89519 Pressure ulcer of right ankle, unspecified stage: Secondary | ICD-10-CM | POA: Diagnosis not present

## 2015-11-10 DIAGNOSIS — S14109S Unspecified injury at unspecified level of cervical spinal cord, sequela: Secondary | ICD-10-CM | POA: Diagnosis not present

## 2015-11-10 LAB — CBC WITH DIFFERENTIAL/PLATELET
BASOS ABS: 0 10*3/uL (ref 0.0–0.1)
BASOS PCT: 0 %
EOS ABS: 0.7 10*3/uL (ref 0.0–0.7)
Eosinophils Relative: 7 %
HCT: 28.4 % — ABNORMAL LOW (ref 39.0–52.0)
Hemoglobin: 9 g/dL — ABNORMAL LOW (ref 13.0–17.0)
Lymphocytes Relative: 25 %
Lymphs Abs: 2.6 10*3/uL (ref 0.7–4.0)
MCH: 27.1 pg (ref 26.0–34.0)
MCHC: 31.7 g/dL (ref 30.0–36.0)
MCV: 85.5 fL (ref 78.0–100.0)
MONO ABS: 0.8 10*3/uL (ref 0.1–1.0)
MONOS PCT: 7 %
Neutro Abs: 6.5 10*3/uL (ref 1.7–7.7)
Neutrophils Relative %: 61 %
PLATELETS: 429 10*3/uL — AB (ref 150–400)
RBC: 3.32 MIL/uL — ABNORMAL LOW (ref 4.22–5.81)
RDW: 14.9 % (ref 11.5–15.5)
WBC: 10.6 10*3/uL — ABNORMAL HIGH (ref 4.0–10.5)

## 2015-11-10 LAB — COMPREHENSIVE METABOLIC PANEL
ALBUMIN: 2.8 g/dL — AB (ref 3.5–5.0)
ALK PHOS: 115 U/L (ref 38–126)
ALT: 19 U/L (ref 17–63)
AST: 15 U/L (ref 15–41)
Anion gap: 8 (ref 5–15)
BILIRUBIN TOTAL: 0.7 mg/dL (ref 0.3–1.2)
BUN: 11 mg/dL (ref 6–20)
CALCIUM: 9 mg/dL (ref 8.9–10.3)
CO2: 27 mmol/L (ref 22–32)
CREATININE: 0.6 mg/dL — AB (ref 0.61–1.24)
Chloride: 102 mmol/L (ref 101–111)
GFR calc non Af Amer: >60 mL/min (ref >60)
GLUCOSE: 98 mg/dL (ref 65–99)
Potassium: 3.8 mmol/L (ref 3.5–5.1)
SODIUM: 137 mmol/L (ref 135–145)
TOTAL PROTEIN: 7.3 g/dL (ref 6.5–8.1)

## 2015-11-10 LAB — URINALYSIS, ROUTINE W REFLEX MICROSCOPIC
BILIRUBIN URINE: NEGATIVE
Glucose, UA: NEGATIVE mg/dL
KETONES UR: NEGATIVE mg/dL
NITRITE: NEGATIVE
PROTEIN: 30 mg/dL — AB
Specific Gravity, Urine: 1.005 (ref 1.005–1.030)
pH: 6.5 (ref 5.0–8.0)

## 2015-11-10 LAB — MRSA PCR SCREENING: MRSA by PCR: NEGATIVE

## 2015-11-10 LAB — PROTIME-INR
INR: 1.41 (ref 0.00–1.49)
Prothrombin Time: 17.3 seconds — ABNORMAL HIGH (ref 11.6–15.2)

## 2015-11-10 LAB — URINE MICROSCOPIC-ADD ON

## 2015-11-10 LAB — I-STAT CG4 LACTIC ACID, ED
LACTIC ACID, VENOUS: 0.65 mmol/L (ref 0.5–2.0)
Lactic Acid, Venous: 1.08 mmol/L (ref 0.5–2.0)

## 2015-11-10 MED ORDER — TIZANIDINE HCL 2 MG PO TABS
2.0000 mg | ORAL_TABLET | Freq: Three times a day (TID) | ORAL | Status: DC | PRN
  Administered 2015-11-14: 2 mg via ORAL
  Filled 2015-11-10: qty 1

## 2015-11-10 MED ORDER — PIPERACILLIN-TAZOBACTAM 3.375 G IVPB 30 MIN
3.3750 g | Freq: Once | INTRAVENOUS | Status: AC
  Administered 2015-11-10: 3.375 g via INTRAVENOUS
  Filled 2015-11-10: qty 50

## 2015-11-10 MED ORDER — AMITRIPTYLINE HCL 25 MG PO TABS
25.0000 mg | ORAL_TABLET | Freq: Every day | ORAL | Status: DC
  Administered 2015-11-10 – 2015-11-14 (×5): 25 mg via ORAL
  Filled 2015-11-10 (×7): qty 1

## 2015-11-10 MED ORDER — FERROUS SULFATE 325 (65 FE) MG PO TABS
325.0000 mg | ORAL_TABLET | Freq: Two times a day (BID) | ORAL | Status: DC
  Administered 2015-11-12 – 2015-11-15 (×6): 325 mg via ORAL
  Filled 2015-11-10 (×7): qty 1

## 2015-11-10 MED ORDER — ZOLPIDEM TARTRATE 5 MG PO TABS
5.0000 mg | ORAL_TABLET | Freq: Every evening | ORAL | Status: DC | PRN
  Administered 2015-11-10 – 2015-11-14 (×4): 5 mg via ORAL
  Filled 2015-11-10 (×4): qty 1

## 2015-11-10 MED ORDER — ADULT MULTIVITAMIN W/MINERALS CH
1.0000 | ORAL_TABLET | Freq: Every day | ORAL | Status: DC
  Administered 2015-11-11 – 2015-11-15 (×5): 1 via ORAL
  Filled 2015-11-10 (×5): qty 1

## 2015-11-10 MED ORDER — GABAPENTIN 300 MG PO CAPS
300.0000 mg | ORAL_CAPSULE | Freq: Three times a day (TID) | ORAL | Status: DC
  Administered 2015-11-10 – 2015-11-15 (×15): 300 mg via ORAL
  Filled 2015-11-10 (×14): qty 1

## 2015-11-10 MED ORDER — ACETAMINOPHEN 325 MG PO TABS
650.0000 mg | ORAL_TABLET | Freq: Once | ORAL | Status: AC
  Administered 2015-11-10: 650 mg via ORAL
  Filled 2015-11-10: qty 2

## 2015-11-10 MED ORDER — PIPERACILLIN-TAZOBACTAM 3.375 G IVPB
3.3750 g | Freq: Three times a day (TID) | INTRAVENOUS | Status: DC
  Administered 2015-11-10 – 2015-11-15 (×15): 3.375 g via INTRAVENOUS
  Filled 2015-11-10 (×16): qty 50

## 2015-11-10 MED ORDER — ONDANSETRON HCL 4 MG/2ML IJ SOLN: 4.0000 mg | Freq: Four times a day (QID) | INTRAMUSCULAR | Status: DC | PRN

## 2015-11-10 MED ORDER — SODIUM CHLORIDE 0.9 % IJ SOLN
3.0000 mL | Freq: Two times a day (BID) | INTRAMUSCULAR | Status: DC
  Administered 2015-11-10 – 2015-11-14 (×7): 3 mL via INTRAVENOUS

## 2015-11-10 MED ORDER — ENOXAPARIN SODIUM 40 MG/0.4ML ~~LOC~~ SOLN
40.0000 mg | SUBCUTANEOUS | Status: DC
  Administered 2015-11-11 – 2015-11-14 (×4): 40 mg via SUBCUTANEOUS
  Filled 2015-11-10 (×4): qty 0.4

## 2015-11-10 MED ORDER — ACETAMINOPHEN 650 MG RE SUPP: 650.0000 mg | Freq: Four times a day (QID) | RECTAL | Status: DC | PRN

## 2015-11-10 MED ORDER — VANCOMYCIN HCL IN DEXTROSE 1-5 GM/200ML-% IV SOLN
1000.0000 mg | Freq: Once | INTRAVENOUS | Status: AC
  Administered 2015-11-10: 1000 mg via INTRAVENOUS
  Filled 2015-11-10: qty 200

## 2015-11-10 MED ORDER — VANCOMYCIN HCL IN DEXTROSE 750-5 MG/150ML-% IV SOLN
750.0000 mg | Freq: Two times a day (BID) | INTRAVENOUS | Status: DC
  Administered 2015-11-10 – 2015-11-12 (×4): 750 mg via INTRAVENOUS
  Filled 2015-11-10 (×8): qty 150

## 2015-11-10 MED ORDER — POLYETHYLENE GLYCOL 3350 17 G PO PACK
17.0000 g | PACK | Freq: Every day | ORAL | Status: DC
  Administered 2015-11-12 – 2015-11-15 (×4): 17 g via ORAL
  Filled 2015-11-10 (×5): qty 1

## 2015-11-10 MED ORDER — COLLAGENASE 250 UNIT/GM EX OINT
1.0000 "application " | TOPICAL_OINTMENT | Freq: Every day | CUTANEOUS | Status: DC
  Administered 2015-11-10 – 2015-11-11 (×2): 1 via TOPICAL
  Filled 2015-11-10: qty 30

## 2015-11-10 MED ORDER — SACCHAROMYCES BOULARDII 250 MG PO CAPS
250.0000 mg | ORAL_CAPSULE | Freq: Two times a day (BID) | ORAL | Status: DC
  Administered 2015-11-10 – 2015-11-15 (×10): 250 mg via ORAL
  Filled 2015-11-10 (×10): qty 1

## 2015-11-10 MED ORDER — DICLOFENAC SODIUM 1 % TD GEL
2.0000 g | Freq: Three times a day (TID) | TRANSDERMAL | Status: DC
  Administered 2015-11-10 – 2015-11-15 (×14): 2 g via TOPICAL
  Filled 2015-11-10: qty 100

## 2015-11-10 MED ORDER — ONDANSETRON HCL 4 MG PO TABS: 4.0000 mg | ORAL_TABLET | Freq: Four times a day (QID) | ORAL | Status: DC | PRN

## 2015-11-10 MED ORDER — VITAMIN B-12 1000 MCG PO TABS
1000.0000 ug | ORAL_TABLET | Freq: Every day | ORAL | Status: DC
  Administered 2015-11-11 – 2015-11-15 (×5): 1000 ug via ORAL
  Filled 2015-11-10 (×5): qty 1

## 2015-11-10 MED ORDER — ACETAMINOPHEN 325 MG PO TABS
650.0000 mg | ORAL_TABLET | Freq: Four times a day (QID) | ORAL | Status: DC | PRN
  Administered 2015-11-10 – 2015-11-12 (×2): 650 mg via ORAL
  Filled 2015-11-10 (×2): qty 2

## 2015-11-10 MED ORDER — BISACODYL 10 MG RE SUPP: 10.0000 mg | Freq: Every day | RECTAL | Status: DC | PRN

## 2015-11-10 MED ORDER — VITAMIN C 500 MG PO TABS
500.0000 mg | ORAL_TABLET | Freq: Two times a day (BID) | ORAL | Status: DC
  Administered 2015-11-10 – 2015-11-15 (×10): 500 mg via ORAL
  Filled 2015-11-10 (×10): qty 1

## 2015-11-10 MED ORDER — SODIUM CHLORIDE 0.9 % IV SOLN
INTRAVENOUS | Status: DC
  Administered 2015-11-10 – 2015-11-11 (×2): via INTRAVENOUS

## 2015-11-10 MED ORDER — ZINC SULFATE 220 (50 ZN) MG PO CAPS
220.0000 mg | ORAL_CAPSULE | Freq: Every day | ORAL | Status: DC
  Administered 2015-11-10 – 2015-11-15 (×6): 220 mg via ORAL
  Filled 2015-11-10 (×6): qty 1

## 2015-11-10 MED ORDER — SODIUM CHLORIDE 0.9 % IV BOLUS (SEPSIS)
1000.0000 mL | INTRAVENOUS | Status: AC
Start: 2015-11-10 — End: 2015-11-10
  Administered 2015-11-10 (×2): 1000 mL via INTRAVENOUS

## 2015-11-10 MED ORDER — CETYLPYRIDINIUM CHLORIDE 0.05 % MT LIQD
7.0000 mL | Freq: Two times a day (BID) | OROMUCOSAL | Status: DC
  Administered 2015-11-10 – 2015-11-15 (×9): 7 mL via OROMUCOSAL

## 2015-11-10 MED ORDER — BACLOFEN 10 MG PO TABS
10.0000 mg | ORAL_TABLET | Freq: Three times a day (TID) | ORAL | Status: DC
  Administered 2015-11-10 – 2015-11-15 (×15): 10 mg via ORAL
  Filled 2015-11-10 (×14): qty 1

## 2015-11-10 NOTE — ED Notes (Signed)
When the pt was asked about his foley catheter care and maintenance by this RN, pt informed this RN and Tremaine, EMT that the facility had never changed his catheter and that the "Usually change the bag when it bursts." The patient has been at this facility since late June. The catheter had heavy amounts of sediment in the tubing. When Tremaine deflated the catheter balloon in order to remove the foley, urine was pulled back as if the balloon had ruptured. Catheter successfully changed. Staff at facility contacted. This RN was informed that their policy is to change the catheter q30 days. The nurse that usually takes care of this patient was not in today, so this RN spoke with a supervising RN about the situation. This RN was informed that the supervisor would "look into the matter" and that this was "unacceptable." Pt informed to advocate for his catheter to be changed frequently.

## 2015-11-10 NOTE — ED Provider Notes (Signed)
CSN: [id number]     Arrival date & time 11/10/15  1007 History   First MD Initiated Contact with Patient 11/10/15 1007     Chief Complaint  Patient presents with  . Ankle Pain     (Consider location/radiation/quality/duration/timing/severity/associated sxs/prior Treatment) Patient is a 49 y.o. male presenting with ankle pain. The history is provided by the patient and medical records. No language interpreter was used.  Ankle Pain Associated symptoms: fever   Associated symptoms: no fatigue and no neck pain    Phillip [name] is a 49 y.o. male  with a PMH of c spine injury resulting in quadriplegia, right ankle fx requiring ORIF who presents to the Emergency Department from [facility] for likely ankle infection. Pt. Admits to swelling which has progressed over a few weeks. New onset of erythema and throbbing pain 7/10. Denies radiation of pain.    Past Medical History  Diagnosis Date  . DJD (degenerative joint disease)   . Seizures ([place])   . Broken hip ([place])   . Cervical spinal cord injury ([place])   . Tobacco abuse    Past Surgical History  Procedure Laterality Date  . Knee surgery      right knee surgery 1988  . Intramedullary (im) nail intertrochanteric Right 10/09/2014    Procedure: INTRAMEDULLARY (IM) NAIL INTERTROCHANTRIC Right knee aspiration;  Surgeon: Melina Schools, MD;  Location: [facility];  Service: Orthopedics;  Laterality: Right;  . Orif ankle fracture Right 05/26/2015    Procedure: OPEN REDUCTION INTERNAL FIXATION (ORIF) ANKLE FRACTURE;  Surgeon: Meredith Pel, MD;  Location: [place];  Service: Orthopedics;  Laterality: Right;  . Posterior cervical fusion/foraminotomy N/A 05/30/2015    Procedure: C3-C7 decompressive laminectomy with posterior cervical fusion utilizing lateral mass instrumentation and local bone grafting;  Surgeon: Earnie Larsson, MD;  Location: [facility];  Service: Neurosurgery;  Laterality: N/A;  . Incision and drainage of wound N/A 08/24/2015    Procedure:  IRRIGATION AND DEBRIDEMENT SACRAL DECUBITUS ULCER ;  Surgeon: Theodoro Kos, DO;  Location: [place];  Service: Plastics;  Laterality: N/A;  . Application of a-cell of chest/abdomen N/A 08/24/2015    Procedure: PLACEMENT OF A-CELL ANd Wound  VAC;  Surgeon: Theodoro Kos, DO;  Location: [place];  Service: Plastics;  Laterality: N/A;   Family History  Problem Relation Age of Onset  . Hypertension Mother    Social History  Substance Use Topics  . Smoking status: Current Every Day Smoker -- 0.50 packs/day for .2 years    Types: Cigarettes  . Smokeless tobacco: None  . Alcohol Use: No     Comment: Pt has not drank since inj 2 weeks ago    Review of Systems  Constitutional: Positive for fever and appetite change. Negative for diaphoresis and fatigue.  HENT: Negative for congestion, rhinorrhea and sore throat.   Eyes: Negative for visual disturbance.  Respiratory: Negative for cough, shortness of breath and wheezing.   Cardiovascular: Negative for chest pain and palpitations.  Gastrointestinal: Negative for nausea, vomiting, abdominal pain, diarrhea and constipation.  Endocrine: Negative for polydipsia and polyuria.  Musculoskeletal: Positive for myalgias and arthralgias. Negative for neck pain.  Skin: Negative for rash.  Neurological: Negative for dizziness, weakness and headaches.      Allergies  Review of patient's allergies indicates no known allergies.  Home Medications   Prior to Admission medications   Medication Sig Start Date End Date Taking? Authorizing Provider  acetaminophen (TYLENOL) 325 MG tablet Take 650 mg by mouth  3 (three) times daily.   Yes Historical Provider, MD  Amino Acids-Protein Hydrolys (FEEDING SUPPLEMENT, PRO-STAT SUGAR FREE 64,) LIQD Take 30 mLs by mouth 2 (two) times daily.   Yes Historical Provider, MD  amitriptyline (ELAVIL) 25 MG tablet Take 25 mg by mouth at bedtime.   Yes Historical Provider, MD  baclofen (LIORESAL) 10 MG tablet Take 0.5 tablets (5 mg  total) by mouth 3 (three) times daily. Patient taking differently: Take 10 mg by mouth 3 (three) times daily.  06/06/15  Yes Gerlene Fee, NP  bisacodyl (DULCOLAX) 10 MG suppository Place 10 mg rectally daily as needed for moderate constipation.   Yes Historical Provider, MD  collagenase (SANTYL) ointment Apply 1 application topically daily.   Yes Historical Provider, MD  diclofenac sodium (VOLTAREN) 1 % GEL Apply 2 g topically 3 (three) times daily.   Yes Historical Provider, MD  ferrous sulfate 325 (65 FE) MG tablet Take 1 tablet (325 mg total) by mouth 2 (two) times daily with a meal. 08/26/15  Yes Ripudeep K Rai, MD  gabapentin (NEURONTIN) 300 MG capsule Take 300 mg by mouth 3 (three) times daily.   Yes Historical Provider, MD  Multiple Vitamin (MULTIVITAMIN WITH MINERALS) TABS tablet Take 1 tablet by mouth daily. 06/17/15  Yes Verlee Monte, MD  ondansetron (ZOFRAN) 4 MG tablet Take 1 tablet (4 mg total) by mouth every 6 (six) hours as needed for nausea. 08/26/15  Yes Ripudeep Krystal Eaton, MD  oxyCODONE-acetaminophen (ROXICET) 5-325 MG per tablet Take 1-2 tablets by mouth every 4 (four) hours as needed (Pain). 08/26/15  Yes Ripudeep Krystal Eaton, MD  polyethylene glycol (MIRALAX / GLYCOLAX) packet Take 17 g by mouth daily.   Yes Historical Provider, MD  saccharomyces boulardii (FLORASTOR) 250 MG capsule Take 1 capsule (250 mg total) by mouth 2 (two) times daily. 06/17/15  Yes Verlee Monte, MD  tiZANidine (ZANAFLEX) 2 MG tablet Take 2 mg by mouth every 8 (eight) hours as needed for muscle spasms.   Yes Historical Provider, MD  vitamin B-12 1000 MCG tablet Take 1 tablet (1,000 mcg total) by mouth daily. 06/17/15  Yes Verlee Monte, MD  vitamin C (VITAMIN C) 500 MG tablet Take 1 tablet (500 mg total) by mouth 2 (two) times daily. 08/26/15  Yes Ripudeep Krystal Eaton, MD  zinc sulfate 220 MG capsule Take 1 capsule (220 mg total) by mouth daily. Patient taking differently: Take 220 mg by mouth 2 (two) times daily.  08/26/15  Yes  Ripudeep Krystal Eaton, MD  zolpidem (AMBIEN) 5 MG tablet Take 1 tablet (5 mg total) by mouth at bedtime as needed for sleep. 08/26/15  Yes Ripudeep K Rai, MD  DAPTOmycin 500 mg in sodium chloride 0.9 % 100 mL Inject 500 mg into the vein daily. Sop date 09/20/15 08/26/15   Ripudeep Krystal Eaton, MD  enoxaparin (LOVENOX) 40 MG/0.4ML injection Inject 0.4 mLs (40 mg total) into the skin daily. 06/03/15   Lisette Abu, PA-C  feeding supplement (BOOST / RESOURCE BREEZE) LIQD Take 1 Container by mouth daily. 08/26/15   Ripudeep Krystal Eaton, MD  feeding supplement, ENSURE ENLIVE, (ENSURE ENLIVE) LIQD Take 237 mLs by mouth 2 (two) times daily between meals. 08/26/15   Ripudeep Krystal Eaton, MD  meropenem 1 g in sodium chloride 0.9 % 100 mL Inject 1 g into the vein every 8 (eight) hours. Stop date 09/20/15 08/26/15   Ripudeep Krystal Eaton, MD  metoprolol tartrate (LOPRESSOR) 25 MG tablet Take 0.5 tablets (12.5 mg  total) by mouth 2 (two) times daily. 08/26/15   Ripudeep K Rai, MD   BP 133/69 mmHg  Pulse 106  Temp(Src) 100.2 F (37.9 C) (Oral)  Resp 19  SpO2 98% Physical Exam  Constitutional: He is oriented to person, place, and time. He appears well-developed and well-nourished.  Ill appearing but in no acute distress  HENT:  Head: Normocephalic and atraumatic.  Cardiovascular: Intact distal pulses.   Tachy but regular; no m/r/g  Pulmonary/Chest: Effort normal and breath sounds normal. No respiratory distress. He has no wheezes. He has no rales. He exhibits no tenderness.  Abdominal: He exhibits no mass. There is no rebound and no guarding.  Abdomen soft, non-tender, non-distended Bowel sounds positive in all four quadrants  Musculoskeletal:       Right knee: He exhibits swelling and erythema.  Decreased ROM 2/2 quad status Sensory of bilat LE intact Ankle and distal shin warm to the touch Right lateral ulcer open with purulent drainage and revealing hardware.  Left lateral ulcer, clean and dry; no erythema  Neurological: He is  alert and oriented to person, place, and time.  Psychiatric: He has a normal mood and affect. His behavior is normal. Judgment and thought content normal.  Nursing note and vitals reviewed.   ED Course  Procedures (including critical care time) Labs Review Labs Reviewed  COMPREHENSIVE METABOLIC PANEL - Abnormal; Notable for the following:    Creatinine, Ser 0.60 (*)    Albumin 2.8 (*)    All other components within normal limits  CBC WITH DIFFERENTIAL/PLATELET - Abnormal; Notable for the following:    WBC 10.6 (*)    RBC 3.32 (*)    Hemoglobin 9.0 (*)    HCT 28.4 (*)    Platelets 429 (*)    All other components within normal limits  URINALYSIS, ROUTINE W REFLEX MICROSCOPIC (NOT AT [facility]) - Abnormal; Notable for the following:    Color, Urine AMBER (*)    APPearance CLOUDY (*)    Hgb urine dipstick LARGE (*)    Protein, ur 30 (*)    Leukocytes, UA LARGE (*)    All other components within normal limits  URINE MICROSCOPIC-ADD ON - Abnormal; Notable for the following:    Squamous Epithelial / LPF 0-5 (*)    Bacteria, UA FEW (*)    All other components within normal limits  CULTURE, BLOOD (ROUTINE X 2)  CULTURE, BLOOD (ROUTINE X 2)  URINE CULTURE  WOUND CULTURE  I-STAT CG4 LACTIC ACID, ED  I-STAT CG4 LACTIC ACID, ED    Imaging Review Dg Chest Port 1 View  11/10/2015  CLINICAL DATA:  Fever, sepsis, right ankle infection/cellulitis EXAM: PORTABLE CHEST 1 VIEW COMPARISON:  08/22/2015 FINDINGS: The heart size and mediastinal contours are within normal limits. Both lungs are clear. The visualized skeletal structures are unremarkable. IMPRESSION: No active disease. Electronically Signed   By: Jerilynn Mages.  Shick M.D.   On: 11/10/2015 11:11   Dg Ankle Right Port  11/10/2015  CLINICAL DATA:  Nonhealing wound, status post right ankle fracture ORIF, fever, infection EXAM: PORTABLE RIGHT ANKLE - 2 VIEW COMPARISON:  05/26/2015 FINDINGS: Patient this status post ORIF of the bimalleolar fracture.  Diffuse disuse osteopenia evident. No acute osseous finding or hardware abnormality. Normal alignment. No acute fracture. No area of focal bone loss or destruction. Mild diffuse soft tissue swelling without radiopaque foreign body. IMPRESSION: Previous ORIF for a bimalleolar fracture. Diffuse osteopenia Mild soft tissue swelling No acute osseous finding or complicating feature  by plain radiography Electronically Signed   By: Jerilynn Mages.  Shick M.D.   On: 11/10/2015 11:10   I have personally reviewed and evaluated these images and lab results as part of my medical decision-making.   EKG Interpretation   Date/Time:  Thursday November 10 2015 10:28:24 EST Ventricular Rate:  128 PR Interval:  126 QRS Duration: 85 QT Interval:  330 QTC Calculation: 481 R Axis:   -41 Text Interpretation:  Sinus tachycardia Left axis deviation ST elev,  probable normal early repol pattern Borderline prolonged QT interval no  significant change since Aug 2016 Confirmed by GOLDSTON  MD, SCOTT [phone])  on 11/10/2015 10:52:49 AM      MDM   Final diagnoses:  SIRS (systemic inflammatory response syndrome) ([employer])  Pressure ulcer  Quadriplegia ([place])   Phillip [name] presents with likely ankle infection. Has temperature, increased HR, and source of infxn - sepsis protocol will be initiated. Starting pt. On vanc & zosyn. Dr. Marlou Sa is aware pt. Is here.  11:25 - Dr. Marlou Sa made aware that patient is in ED, labs processing, and IV ABX started.  Labs and imaging have been reviewed - white count 10.6, lactic wdl  12:57 PM - Spoke with Dr. Marlou Sa who would like admission to medicine and cx of ankle. Likely will do surgery tonight. Appreciate his assistance.   1:16 PM - Spoke with IM who will admit.   Patient seen by and discussed with Dr. Regenia Skeeter who agrees with treatment plan.   [facility], PA-C 11/10/15 1317  Sherwood Gambler, MD 11/10/15 1550

## 2015-11-10 NOTE — Progress Notes (Signed)
ANTIBIOTIC CONSULT NOTE - INITIAL  Pharmacy Consult for Vancomycin and Zosyn Indication: rule out sepsis  No Known Allergies  Patient Measurements:   Adjusted Body Weight:   Vital Signs: Temp: 100.2 F (37.9 C) (12/01 1229) Temp Source: Oral (12/01 1229) BP: 137/89 mmHg (12/01 1515) Pulse Rate: 103 (12/01 1515) Intake/Output from previous day:   Intake/Output from this shift:    Labs:  Recent Labs  11/10/15 1050  WBC 10.6*  HGB 9.0*  PLT 429*  CREATININE 0.60*   CrCl cannot be calculated (Unknown ideal weight.). No results for input(s): VANCOTROUGH, VANCOPEAK, VANCORANDOM, GENTTROUGH, GENTPEAK, GENTRANDOM, TOBRATROUGH, TOBRAPEAK, TOBRARND, AMIKACINPEAK, AMIKACINTROU, AMIKACIN in the last 72 hours.   Microbiology: No results found for this or any previous visit (from the past 720 hour(s)).  Medical History: Past Medical History  Diagnosis Date  . DJD (degenerative joint disease)   . Seizures ([place])   . Broken hip ([place])   . Cervical spinal cord injury ([place])   . Tobacco abuse     Medications:   (Not in a hospital admission) Scheduled:  Infusions:   Assessment: 49yo male with history of seizures and SCI resulting in quadriplegia presents with possible ankle infection 2/2 R ankle fx requiring ORIF. Pharmacy is consulted to dose vancomycin and zosyn for suspected sepsis. Pt is febrile to 102.8, WBC 10.6, sCr 0.6 with estimated CrCl ~ 78 mL/min.  Pt received vancomycin 1g and zosyn 3.375g IV once in the ED.   Goal of Therapy:  Vancomycin trough level 15-20 mcg/ml  Plan:  Vancomycin 750mg  IV q12h Zosyn 3.375g IV q8h Measure antibiotic drug levels at steady state Follow up culture results, renal function and clinical course  Andrey Cota. Diona Foley, PharmD, [place] Clinical Pharmacist Pager [phone] 11/10/2015,3:49 PM

## 2015-11-10 NOTE — ED Notes (Signed)
Pt arrives via [facility] from Dr. Marlou Sa. Pt has an obvious ankle infection with purulent drainage. Pt right ankle is swollen and red with gross purulent drainage.
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
## 2015-11-10 NOTE — Progress Notes (Signed)
This RN called by Dr. Marlou Sa and informed that pt's surgery to take place tomorrow. Telephone order given to make patient NPO at 0800 on 11/11/15 and give a diet at this time. Will continue to monitor.

## 2015-11-10 NOTE — Progress Notes (Signed)
Patient admitted with bilateral ankle and heel ulcers with additional pressure ulcers located behind left knee and to patients sacrum.  Order for wound care consult placed.  Patient heading to surgery for I/D with device removal.

## 2015-11-11 ENCOUNTER — Inpatient Hospital Stay (HOSPITAL_COMMUNITY): Payer: Medicaid Other | Admitting: Certified Registered"

## 2015-11-11 ENCOUNTER — Encounter (HOSPITAL_COMMUNITY)
Admission: EM | Disposition: A | Discharge status: Skilled Nursing Facility | Payer: Managed Care, Other (non HMO) | Source: Home / Self Care | Attending: Internal Medicine

## 2015-11-11 DIAGNOSIS — L089 Local infection of the skin and subcutaneous tissue, unspecified: Secondary | ICD-10-CM | POA: Diagnosis present

## 2015-11-11 DIAGNOSIS — S90519A Abrasion, unspecified ankle, initial encounter: Secondary | ICD-10-CM

## 2015-11-11 DIAGNOSIS — S14109A Unspecified injury at unspecified level of cervical spinal cord, initial encounter: Secondary | ICD-10-CM

## 2015-11-11 HISTORY — PX: I & D EXTREMITY: SHX5045

## 2015-11-11 HISTORY — PX: HARDWARE REMOVAL: SHX979

## 2015-11-11 HISTORY — PX: APPLICATION OF WOUND VAC: SHX5189

## 2015-11-11 LAB — CBC
HEMATOCRIT: 24.7 % — AB (ref 39.0–52.0)
HEMOGLOBIN: 7.5 g/dL — AB (ref 13.0–17.0)
MCH: 26.1 pg (ref 26.0–34.0)
MCHC: 30.4 g/dL (ref 30.0–36.0)
MCV: 86.1 fL (ref 78.0–100.0)
Platelets: 370 10*3/uL (ref 150–400)
RBC: 2.87 MIL/uL — ABNORMAL LOW (ref 4.22–5.81)
RDW: 14.7 % (ref 11.5–15.5)
WBC: 8.1 10*3/uL (ref 4.0–10.5)

## 2015-11-11 LAB — COMPREHENSIVE METABOLIC PANEL
ALBUMIN: 2.2 g/dL — AB (ref 3.5–5.0)
ALT: 16 U/L — ABNORMAL LOW (ref 17–63)
ANION GAP: 9 (ref 5–15)
AST: 14 U/L — ABNORMAL LOW (ref 15–41)
Alkaline Phosphatase: 94 U/L (ref 38–126)
BUN: <5 mg/dL — ABNORMAL LOW (ref 6–20)
CO2: 26 mmol/L (ref 22–32)
Calcium: 8.5 mg/dL — ABNORMAL LOW (ref 8.9–10.3)
Chloride: 105 mmol/L (ref 101–111)
Creatinine, Ser: 0.51 mg/dL — ABNORMAL LOW (ref 0.61–1.24)
GFR calc Af Amer: >60 mL/min (ref >60)
GFR calc non Af Amer: >60 mL/min (ref >60)
GLUCOSE: 123 mg/dL — AB (ref 65–99)
POTASSIUM: 3.4 mmol/L — AB (ref 3.5–5.1)
Sodium: 140 mmol/L (ref 135–145)
Total Bilirubin: 0.6 mg/dL (ref 0.3–1.2)
Total Protein: 5.7 g/dL — ABNORMAL LOW (ref 6.5–8.1)

## 2015-11-11 SURGERY — IRRIGATION AND DEBRIDEMENT EXTREMITY
Anesthesia: General | Laterality: Right

## 2015-11-11 MED ORDER — FENTANYL CITRATE (PF) 250 MCG/5ML IJ SOLN
INTRAMUSCULAR | Status: AC
  Filled 2015-11-11: qty 5

## 2015-11-11 MED ORDER — SODIUM CHLORIDE 0.9 % IR SOLN
Status: DC | PRN
  Administered 2015-11-11: 1000 mL
  Administered 2015-11-11 (×2): 3000 mL

## 2015-11-11 MED ORDER — ONDANSETRON HCL 4 MG/2ML IJ SOLN
INTRAMUSCULAR | Status: DC | PRN
  Administered 2015-11-11: 4 mg via INTRAVENOUS

## 2015-11-11 MED ORDER — HYDROCODONE-ACETAMINOPHEN 7.5-325 MG PO TABS
1.0000 | ORAL_TABLET | ORAL | Status: DC | PRN
  Administered 2015-11-11 – 2015-11-15 (×7): 1 via ORAL
  Filled 2015-11-11 (×7): qty 1

## 2015-11-11 MED ORDER — LACTATED RINGERS IV SOLN
INTRAVENOUS | Status: DC
  Administered 2015-11-11: 16:00:00 via INTRAVENOUS

## 2015-11-11 MED ORDER — PROPOFOL 10 MG/ML IV BOLUS
INTRAVENOUS | Status: AC
  Filled 2015-11-11: qty 20

## 2015-11-11 MED ORDER — PROPOFOL 10 MG/ML IV BOLUS
INTRAVENOUS | Status: DC | PRN
  Administered 2015-11-11: 120 mg via INTRAVENOUS

## 2015-11-11 MED ORDER — GENTAMICIN SULFATE 40 MG/ML IJ SOLN
INTRAMUSCULAR | Status: DC | PRN
  Administered 2015-11-11: 160 mg

## 2015-11-11 MED ORDER — FENTANYL CITRATE (PF) 100 MCG/2ML IJ SOLN
INTRAMUSCULAR | Status: DC | PRN
  Administered 2015-11-11 (×2): 50 ug via INTRAVENOUS

## 2015-11-11 MED ORDER — ACETAMINOPHEN 650 MG RE SUPP: 650.0000 mg | Freq: Four times a day (QID) | RECTAL | Status: DC | PRN

## 2015-11-11 MED ORDER — ACETAMINOPHEN 325 MG PO TABS: 650.0000 mg | ORAL_TABLET | Freq: Four times a day (QID) | ORAL | Status: DC | PRN

## 2015-11-11 MED ORDER — MIDAZOLAM HCL 5 MG/5ML IJ SOLN
INTRAMUSCULAR | Status: DC | PRN
  Administered 2015-11-11: 1 mg via INTRAVENOUS

## 2015-11-11 MED ORDER — MIDAZOLAM HCL 2 MG/2ML IJ SOLN
INTRAMUSCULAR | Status: AC
  Filled 2015-11-11: qty 2

## 2015-11-11 MED ORDER — ENSURE ENLIVE PO LIQD
237.0000 mL | Freq: Three times a day (TID) | ORAL | Status: DC
  Administered 2015-11-12 – 2015-11-15 (×10): 237 mL via ORAL

## 2015-11-11 MED ORDER — OXYCODONE HCL 5 MG PO TABS: 5.0000 mg | ORAL_TABLET | Freq: Once | ORAL | Status: DC | PRN

## 2015-11-11 MED ORDER — LIDOCAINE HCL (CARDIAC) 20 MG/ML IV SOLN
INTRAVENOUS | Status: DC | PRN
  Administered 2015-11-11: 80 mg via INTRAVENOUS

## 2015-11-11 MED ORDER — VANCOMYCIN HCL 500 MG IV SOLR
INTRAVENOUS | Status: AC
  Filled 2015-11-11: qty 500

## 2015-11-11 MED ORDER — METOCLOPRAMIDE HCL 5 MG PO TABS: 5.0000 mg | ORAL_TABLET | Freq: Three times a day (TID) | ORAL | Status: DC | PRN

## 2015-11-11 MED ORDER — VANCOMYCIN HCL 500 MG IV SOLR
INTRAVENOUS | Status: DC | PRN
  Administered 2015-11-11: 500 mg

## 2015-11-11 MED ORDER — GENTAMICIN SULFATE 40 MG/ML IJ SOLN
INTRAMUSCULAR | Status: AC
  Filled 2015-11-11: qty 2

## 2015-11-11 MED ORDER — ONDANSETRON HCL 4 MG PO TABS: 4.0000 mg | ORAL_TABLET | Freq: Four times a day (QID) | ORAL | Status: DC | PRN

## 2015-11-11 MED ORDER — OXYCODONE HCL 5 MG/5ML PO SOLN: 5.0000 mg | Freq: Once | ORAL | Status: DC | PRN

## 2015-11-11 MED ORDER — HYDROMORPHONE HCL 1 MG/ML IJ SOLN: 0.2500 mg | INTRAMUSCULAR | Status: DC | PRN

## 2015-11-11 MED ORDER — POTASSIUM CHLORIDE CRYS ER 20 MEQ PO TBCR
40.0000 meq | EXTENDED_RELEASE_TABLET | Freq: Once | ORAL | Status: AC
  Administered 2015-11-11: 40 meq via ORAL
  Filled 2015-11-11: qty 2

## 2015-11-11 MED ORDER — METOCLOPRAMIDE HCL 5 MG/ML IJ SOLN: 5.0000 mg | Freq: Three times a day (TID) | INTRAMUSCULAR | Status: DC | PRN

## 2015-11-11 MED ORDER — PHENYLEPHRINE HCL 10 MG/ML IJ SOLN
10.0000 mg | INTRAMUSCULAR | Status: DC | PRN
  Administered 2015-11-11: 50 ug/min via INTRAVENOUS

## 2015-11-11 MED ORDER — ONDANSETRON HCL 4 MG/2ML IJ SOLN: 4.0000 mg | Freq: Four times a day (QID) | INTRAMUSCULAR | Status: DC | PRN

## 2015-11-11 MED ORDER — LACTATED RINGERS IV SOLN
INTRAVENOUS | Status: DC | PRN
  Administered 2015-11-11 (×2): via INTRAVENOUS

## 2015-11-11 MED ORDER — PHENYLEPHRINE HCL 10 MG/ML IJ SOLN
INTRAMUSCULAR | Status: DC | PRN
  Administered 2015-11-11: 40 ug via INTRAVENOUS
  Administered 2015-11-11: 80 ug via INTRAVENOUS
  Administered 2015-11-11: 40 ug via INTRAVENOUS
  Administered 2015-11-11: 80 ug via INTRAVENOUS
  Administered 2015-11-11: 40 ug via INTRAVENOUS
  Administered 2015-11-11: 80 ug via INTRAVENOUS

## 2015-11-11 MED ORDER — ONDANSETRON HCL 4 MG/2ML IJ SOLN: 4.0000 mg | Freq: Once | INTRAMUSCULAR | Status: DC | PRN

## 2015-11-11 SURGICAL SUPPLY — 88 items
500ML CANISTER FOR VAC ×2 IMPLANT
APL SKNCLS STERI-STRIP NONHPOA (GAUZE/BANDAGES/DRESSINGS) ×1
BANDAGE ELASTIC 4 VELCRO ST LF (GAUZE/BANDAGES/DRESSINGS) IMPLANT
BANDAGE ELASTIC 6 VELCRO ST LF (GAUZE/BANDAGES/DRESSINGS) IMPLANT
BANDAGE ESMARK 6X9 LF (GAUZE/BANDAGES/DRESSINGS) IMPLANT
BENZOIN TINCTURE PRP APPL 2/3 (GAUZE/BANDAGES/DRESSINGS) ×2 IMPLANT
BNDG CMPR 9X4 STRL LF SNTH (GAUZE/BANDAGES/DRESSINGS) ×1
BNDG CMPR 9X6 STRL LF SNTH (GAUZE/BANDAGES/DRESSINGS)
BNDG COHESIVE 4X5 TAN STRL (GAUZE/BANDAGES/DRESSINGS) IMPLANT
BNDG ESMARK 4X9 LF (GAUZE/BANDAGES/DRESSINGS) ×2 IMPLANT
BNDG ESMARK 6X9 LF (GAUZE/BANDAGES/DRESSINGS)
BNDG GAUZE ELAST 4 BULKY (GAUZE/BANDAGES/DRESSINGS) ×3 IMPLANT
CANISTER WOUND CARE 500ML ATS (WOUND CARE) ×2 IMPLANT
CONNECTOR Y ATS VAC SYSTEM (MISCELLANEOUS) ×2 IMPLANT
COVER SURGICAL LIGHT HANDLE (MISCELLANEOUS) ×3 IMPLANT
CUFF TOURNIQUET SINGLE 18IN (TOURNIQUET CUFF) ×3 IMPLANT
CUFF TOURNIQUET SINGLE 24IN (TOURNIQUET CUFF) IMPLANT
CUFF TOURNIQUET SINGLE 34IN LL (TOURNIQUET CUFF) IMPLANT
CUFF TOURNIQUET SINGLE 44IN (TOURNIQUET CUFF) IMPLANT
DRAPE C-ARM 42X72 X-RAY (DRAPES) IMPLANT
DRAPE EXTREMITY T 121X128X90 (DRAPE) IMPLANT
DRAPE INCISE IOBAN 66X45 STRL (DRAPES) IMPLANT
DRAPE ORTHO SPLIT 77X108 STRL (DRAPES)
DRAPE PROXIMA HALF (DRAPES) IMPLANT
DRAPE SURG ORHT 6 SPLT 77X108 (DRAPES) IMPLANT
DRAPE U-SHAPE 47X51 STRL (DRAPES) ×3 IMPLANT
DRSG EMULSION OIL 3X3 NADH (GAUZE/BANDAGES/DRESSINGS) ×3 IMPLANT
DRSG PAD ABDOMINAL 8X10 ST (GAUZE/BANDAGES/DRESSINGS) ×3 IMPLANT
DRSG VAC ATS SM SENSATRAC (GAUZE/BANDAGES/DRESSINGS) ×2 IMPLANT
DURAPREP 26ML APPLICATOR (WOUND CARE) ×3 IMPLANT
ELECT CAUTERY BLADE 6.4 (BLADE) ×2 IMPLANT
ELECT REM PT RETURN 9FT ADLT (ELECTROSURGICAL) ×3
ELECTRODE REM PT RTRN 9FT ADLT (ELECTROSURGICAL) ×1 IMPLANT
FACESHIELD STD STERILE (MASK) ×2 IMPLANT
FACESHIELD WRAPAROUND (MASK) ×3 IMPLANT
FACESHIELD WRAPAROUND OR TEAM (MASK) ×1 IMPLANT
GAUZE SPONGE 4X4 12PLY STRL (GAUZE/BANDAGES/DRESSINGS) ×3 IMPLANT
GAUZE XEROFORM 1X8 LF (GAUZE/BANDAGES/DRESSINGS) ×3 IMPLANT
GAUZE XEROFORM 5X9 LF (GAUZE/BANDAGES/DRESSINGS) IMPLANT
GLOVE BIOGEL PI IND STRL 8 (GLOVE) ×1 IMPLANT
GLOVE BIOGEL PI INDICATOR 8 (GLOVE) ×2
GLOVE ECLIPSE 6.5 STRL STRAW (GLOVE) ×2 IMPLANT
GLOVE SURG ORTHO 8.0 STRL STRW (GLOVE) ×3 IMPLANT
GLOVE SURG SS PI 7.0 STRL IVOR (GLOVE) ×2 IMPLANT
GOWN STRL REUS W/ TWL LRG LVL3 (GOWN DISPOSABLE) ×3 IMPLANT
GOWN STRL REUS W/ TWL XL LVL3 (GOWN DISPOSABLE) ×1 IMPLANT
GOWN STRL REUS W/TWL LRG LVL3 (GOWN DISPOSABLE) ×9
GOWN STRL REUS W/TWL XL LVL3 (GOWN DISPOSABLE) ×3
HANDPIECE INTERPULSE COAX TIP (DISPOSABLE)
KIT BASIN OR (CUSTOM PROCEDURE TRAY) ×3 IMPLANT
KIT PREVENA INCISION MGT 13 (CANNISTER) ×2 IMPLANT
KIT ROOM TURNOVER OR (KITS) ×3 IMPLANT
KIT STIMULAN RAPID CURE 5CC (Orthopedic Implant) ×2 IMPLANT
MANIFOLD NEPTUNE II (INSTRUMENTS) ×3 IMPLANT
NEEDLE 22X1 1/2 (OR ONLY) (NEEDLE) ×2 IMPLANT
NS IRRIG 1000ML POUR BTL (IV SOLUTION) ×3 IMPLANT
PACK GENERAL/GYN (CUSTOM PROCEDURE TRAY) ×3 IMPLANT
PACK ORTHO EXTREMITY (CUSTOM PROCEDURE TRAY) ×3 IMPLANT
PAD ARMBOARD 7.5X6 YLW CONV (MISCELLANEOUS) ×6 IMPLANT
PAD CAST 4YDX4 CTTN HI CHSV (CAST SUPPLIES) ×2 IMPLANT
PADDING CAST COTTON 4X4 STRL (CAST SUPPLIES) ×6
PADDING CAST COTTON 6X4 STRL (CAST SUPPLIES) ×6 IMPLANT
SET HNDPC FAN SPRY TIP SCT (DISPOSABLE) IMPLANT
SPONGE LAP 18X18 X RAY DECT (DISPOSABLE) ×9 IMPLANT
SPONGE LAP 4X18 X RAY DECT (DISPOSABLE) ×3 IMPLANT
STAPLER VISISTAT 35W (STAPLE) ×3 IMPLANT
STOCKINETTE IMPERVIOUS 9X36 MD (GAUZE/BANDAGES/DRESSINGS) ×3 IMPLANT
SUT ETHILON 2 0 FS 18 (SUTURE) IMPLANT
SUT ETHILON 3 0 PS 1 (SUTURE) IMPLANT
SUT ETHILON 4 0 FS 1 (SUTURE) IMPLANT
SUT ETHILON 4 0 PS 2 18 (SUTURE) IMPLANT
SUT PROLENE 3 0 PS 2 (SUTURE) IMPLANT
SUT VIC AB 0 CT1 27 (SUTURE) ×12
SUT VIC AB 0 CT1 27XBRD ANBCTR (SUTURE) IMPLANT
SUT VIC AB 2-0 CT1 27 (SUTURE)
SUT VIC AB 2-0 CT1 TAPERPNT 27 (SUTURE) IMPLANT
SUT VIC AB 3-0 SH 27 (SUTURE)
SUT VIC AB 3-0 SH 27X BRD (SUTURE) IMPLANT
SYR 5ML LL (SYRINGE) ×2 IMPLANT
TOWEL OR 17X24 6PK STRL BLUE (TOWEL DISPOSABLE) ×3 IMPLANT
TOWEL OR 17X26 10 PK STRL BLUE (TOWEL DISPOSABLE) ×5 IMPLANT
TUBE ANAEROBIC SPECIMEN COL (MISCELLANEOUS) IMPLANT
TUBE CONNECTING 12'X1/4 (SUCTIONS) ×1
TUBE CONNECTING 12X1/4 (SUCTIONS) ×2 IMPLANT
UNDERPAD 30X30 INCONTINENT (UNDERPADS AND DIAPERS) ×3 IMPLANT
VAC GRANUFOAM DRESSING SMALL ×1 IMPLANT
WATER STERILE IRR 1000ML POUR (IV SOLUTION) ×3 IMPLANT
YANKAUER SUCT BULB TIP NO VENT (SUCTIONS) ×3 IMPLANT

## 2015-11-11 NOTE — Anesthesia Procedure Notes (Signed)
Procedure Name: Intubation Date/Time: 11/11/2015 6:51 PM Performed by: Tressia Miners LEFFEW Pre-anesthesia Checklist: Patient identified, Patient being monitored, Timeout performed, Emergency Drugs available and Suction available Patient Re-evaluated:Patient Re-evaluated prior to inductionOxygen Delivery Method: Circle System Utilized Preoxygenation: Pre-oxygenation with 100% oxygen Intubation Type: IV induction Ventilation: Mask ventilation without difficulty Laryngoscope Size: Mac and 3 Grade View: Grade II Tube type: Oral Tube size: 7.5 mm Number of attempts: 1 Airway Equipment and Method: Stylet Placement Confirmation: ETT inserted through vocal cords under direct vision,  positive ETCO2 and breath sounds checked- equal and bilateral Secured at: 22 cm Tube secured with: Tape Dental Injury: Teeth and Oropharynx as per pre-operative assessment

## 2015-11-11 NOTE — Brief Op Note (Signed)
11/10/2015 - 11/11/2015  7:23 PM  PATIENT:  Phillip [name]  49 y.o. male  PRE-OPERATIVE DIAGNOSIS:  Right Ankle Infection  POST-OPERATIVE DIAGNOSIS:  Right Ankle Infection  PROCEDURE:  Procedure(s): IRRIGATION AND DEBRIDEMENT RIGHT ANKLE HARDWARE REMOVAL RIGHT ANKLE APPLICATION OF WOUND VAC Heel ulcer debridement and placement of wound vac  SURGEON:  Surgeon(s): Meredith Pel, MD  ASSISTANT: none  ANESTHESIA:   general  EBL: 25 ml    Total I/O In: 900 [I.V.:900] Out: -   BLOOD ADMINISTERED: none  DRAINS: wound vac   LOCAL MEDICATIONS USED:  none  SPECIMEN:  Cultures sent  COUNTS:  YES  TOURNIQUET:  * No tourniquets in log *  DICTATION: .Other Dictation: Dictation Number [id number]  PLAN OF CARE: Admit to inpatient   PATIENT DISPOSITION:  PACU - hemodynamically stable

## 2015-11-11 NOTE — Progress Notes (Signed)
UR COMPLETED  
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
## 2015-11-11 NOTE — Transfer of Care (Signed)
Immediate Anesthesia Transfer of Care Note  Patient: Phillip [name]  Procedure(s) Performed: Procedure(s): IRRIGATION AND DEBRIDEMENT RIGHT ANKLE (Right) HARDWARE REMOVAL RIGHT ANKLE (Right) APPLICATION OF WOUND VAC (Right)  Patient Location: PACU  Anesthesia Type:General  Level of Consciousness: awake, alert , patient cooperative and responds to stimulation  Airway & Oxygen Therapy: Patient Spontanous Breathing and Patient connected to nasal cannula oxygen  Post-op Assessment: Report given to RN and Post -op Vital signs reviewed and stable  Post vital signs: Reviewed and stable  Last Vitals:  Filed Vitals:   11/11/15 1542 11/11/15 1935  BP: 127/91 147/102  Pulse: 100 108  Temp: 37.2 C 36.3 C  Resp: 15 13    Complications: No apparent anesthesia complications

## 2015-11-11 NOTE — Progress Notes (Signed)
Per floor RN patient had a breakfast of cereal, bacon, eggs, toast. Per Dr. Kalman Shan patient to be delayed until at least 1700. Room RN, transporter, and OR desk notified.

## 2015-11-11 NOTE — Progress Notes (Addendum)
Lab reported gram + cocci, paged attending DR to notify. Will continue to monitor.
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
## 2015-11-11 NOTE — Progress Notes (Signed)
Patient stable. Patient has draining right sinus right lower Ebony Hail the following plate fixation of fibular fracture several months ago. Patient had healed the fracture is healed but due to increased pressure from laying in and actually rotated position the patient has developed ulcerations on both the lateral malleolus on the right and lateral malleolus on the left. Purulent discharge is present from this incision. Patient was admitted yesterday to the medical service with diagnosis of sepsis. Cultures were obtained. IV antibiotics of been started.  Plan is for hardware removal and because the patient is paraplegic likely fibular removal. This is done in order to achieve maximal chance at eradication of infection. Risk and benefits discussed with the patient including but limited to infection nerve vessel damage. Patient is paralyzed so chance of ambulation minimal to none in the future. I think it's possible that if the infection persists then more drastic measures may be required. Patient's also developing pressure ulcers on the right heel which will also be addressed today with debridement and possible wound VAC application.

## 2015-11-11 NOTE — Progress Notes (Signed)
  Date: 11/11/2015  Patient name: Phillip [name]  Medical record number: [id number]  Date of birth: 18-Mar-1966   I have seen and evaluated [name] and discussed their care with the Residency Team. 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 He was staying in SNF and developed pressure ulcers = sacrum, B lateral malleolus, and R heel. He had increased pain of the R ankle and at his wound care appt, it was noticed that the wound had purulent drainage and exposure of the previously placed hardware. He came to ED and dx with sepsis. Cxs were obtained and ABX were started. Ortho is taking him to the OR today. Today he has no complaints.  PMHx, Fam Hx, and/or Soc Hx : Living in ALF but not getting PT  Filed Vitals:   11/11/15 0700 11/11/15 1215  BP:  146/93  Pulse:  108  Temp: 97.9 F (36.6 C) 97.8 F (36.6 C)  Resp:  18  BP 146/93, HR 108, RR 18, O2 sat 100 Gen : NAD H tachy no MRG LCTAB anteriorly Abd + BS Ext no edema Purulent drainage from R lateral malleolus wound - deep 1 cm  Assessment and Plan: I have seen and evaluated the patient as outlined above. I agree with the formulated Assessment and Plan as detailed in the residents' admission note, with the following changes:   1. Sepsis 2/2 infected R ankle ulcer - Mr Spirko was started on ABX but will need source control and ortho is taking him to the OR today. We will F/U cx results and wound care instructions.   Bartholomew Crews, MD 12/2/20161:30 PM

## 2015-11-11 NOTE — Progress Notes (Signed)
Physical medicine rehabilitation consult requested chart reviewed. Patient resides at [facility] facility admitted 11/10/2015 workup for sepsis.Hold on formal rehab consult for now and monitor progress.Need to confirm discharge planning for patient to return to SNF

## 2015-11-11 NOTE — Anesthesia Preprocedure Evaluation (Addendum)
Anesthesia Evaluation  Patient identified by MRN, date of birth, ID band Patient awake    Reviewed: Allergy & Precautions, NPO status , Patient's Chart, lab work & pertinent test results  Airway Mallampati: II  TM Distance: >3 FB Neck ROM: Full    Dental no notable dental hx. (+) Teeth Intact, Dental Advisory Given   Pulmonary Current Smoker,    Pulmonary exam normal breath sounds clear to auscultation       Cardiovascular negative cardio ROS Normal cardiovascular exam Rhythm:Regular Rate:Normal     Neuro/Psych Seizures -,  PSYCHIATRIC DISORDERS Quadriplegia ([place] negative psych ROS   GI/Hepatic negative GI ROS, (+)     substance abuse  alcohol use,   Endo/Other  negative endocrine ROS  Renal/GU Renal InsufficiencyRenal disease  negative genitourinary   Musculoskeletal negative musculoskeletal ROS (+) Quadriplegic   Abdominal   Peds negative pediatric ROS (+)  Hematology negative hematology ROS (+)   Anesthesia Other Findings   Reproductive/Obstetrics negative OB ROS                           Anesthesia Physical  Anesthesia Plan  ASA: III  Anesthesia Plan: General   Post-op Pain Management:    Induction: Intravenous  Airway Management Planned: Oral ETT  Additional Equipment:   Intra-op Plan:   Post-operative Plan: Extubation in OR  Informed Consent: I have reviewed the patients History and Physical, chart, labs and discussed the procedure including the risks, benefits and alternatives for the proposed anesthesia with the patient or authorized representative who has indicated his/her understanding and acceptance.   Dental advisory given  Plan Discussed with: CRNA, Anesthesiologist and Surgeon  Anesthesia Plan Comments: (Patient had full breakfast at 0900)      Anesthesia Quick Evaluation

## 2015-11-11 NOTE — Evaluation (Signed)
Physical Therapy Evaluation Patient Details Name: HANSEN SWITZER MRN: [id number] DOB: [birth year] Today's Date: 11/11/2015   History of Present Illness  Mr. Sipe is a 49 year old man with a past medical history of a recent cervical spin injury (05/2015) and resulting in quadriplegia and R ankle fracture with hardware placement who presents with right ankle discomfort along w/ sepsis dx 2/2 purulent drainage and exposure of Rt ankle hardware.  He is scheduled for I&D this afternoon.  Pt has multiple extensive pressure ulcers all over body.  Pt's PMH includes seizures.  Clinical Impression  Pt admitted with above diagnosis. Pt currently with functional limitations due to the deficits listed below (see PT Problem List). Mr. Letter is a very motivated individual who has noticed a gradual increase in strength of Bil LE/UEs for the past 2 months.  He is eager to be able to become as independent as possible and demonstrates improvement functionally since last hospital stay.  He was able to sit upright EOB w/ min A.  Pt will benefit from skilled PT/OT in the CIR setting to increase his independence and safety with mobility to allow discharge to the venue listed below.      Follow Up Recommendations CIR;Supervision/Assistance - 24 hour    Equipment Recommendations  Wheelchair (measurements PT);Wheelchair cushion (measurements PT) (power WC)    Recommendations for Other Services Rehab consult     Precautions / Restrictions Precautions Precautions: Fall Precaution Comments: incomplete SCI, extensive pressure ulcers Restrictions Weight Bearing Restrictions: No      Mobility  Bed Mobility Overal bed mobility: Needs Assistance;+2 for physical assistance Bed Mobility: Rolling;Sidelying to Sit;Sit to Supine Rolling: Mod assist;+2 for physical assistance Sidelying to sit: HOB elevated;Mod assist;+2 for physical assistance   Sit to supine: Max assist;+2 for physical assistance   General bed  mobility comments: Pt initiates steps to achieve sitting.  Cues for reaching to roll and pt able to participate in pushing up to sitting from sidelying w/ A provided to trunk.  A managing Bil LEs and trunk to return to supine using bed pad.  Transfers Overall transfer level: Needs assistance                  Ambulation/Gait                Stairs            Wheelchair Mobility    Modified Rankin (Stroke Patients Only)       Balance Overall balance assessment: Needs assistance Sitting-balance support: Feet supported;Bilateral upper extremity supported Sitting balance-Leahy Scale: Poor Sitting balance - Comments: Min A to maintain balance w/ facilitation techniques provided to scapula and upper thoracic by OT for upright posture and deep breathing Postural control: Posterior lean                                   Pertinent Vitals/Pain Pain Assessment: Faces Faces Pain Scale: Hurts little more Pain Location: B feet from ulcers Pain Descriptors / Indicators: Grimacing Pain Intervention(s): Repositioned;Monitored during session    Home Living Family/patient expects to be discharged to:: Inpatient rehab                 Additional Comments: Coming from SNF.  Has Tilt in space manual WC w/ foam pad    Prior Function Level of Independence: Needs assistance   Gait / Transfers Assistance Needed: Lift used at SNF to  get to [facility] to sit up ~12-8p each day.  ADL's / Homemaking Assistance Needed: Pt reports receiving assist for all ADLs at SNF; however pt is at appropriate level of functioning to participate in some ADLs        Hand Dominance   Dominant Hand: Right    Extremity/Trunk Assessment   Upper Extremity Assessment: RUE deficits/detail;LUE deficits/detail RUE Deficits / Details: 2/5 shoulder and elbow flexion      LUE Deficits / Details: 3/5 shoulder, 3+/5 elbow flexion, 3/5 elbow extension, 3/5 forearm, 2+/5 wrist extension, trace  thumb   Lower Extremity Assessment: Defer to PT evaluation RLE Deficits / Details: Increased tone; clonus noted; able to initiate Rt hip flexion in supine and sitting, otherwise grossly 0-1/5 LLE Deficits / Details: Increased tone; clonus noted. Able to initiate Lt hip flexion in supine and sitting, 1/5 knee extension, 0/5 DF  Cervical / Trunk Assessment: Other exceptions  Communication   Communication: No difficulties  Cognition Arousal/Alertness: Awake/alert Behavior During Therapy: WFL for tasks assessed/performed (highly motivated) Overall Cognitive Status: Within Functional Limits for tasks assessed                      General Comments General comments (skin integrity, edema, etc.): Pt will benefit greatly from [facility] assessment for power WC w/ new pad as pt has enough function in Lt UE and to prevent pressure ulcers.  Pt reports that he is aware of pressure relieving techniques for positioning in [facility] and has been doing this on his own while at SNF.    Exercises General Exercises - Lower Extremity Ankle Circles/Pumps: PROM;10 reps;Left;Supine Long Arc Quad: PROM;Both;10 reps;Seated Hip Flexion/Marching: AAROM;10 reps;PROM;Left;Right;Seated;Limitations Hip Flexion/Marching Limitations: Pt able to initiate but AAROM/PROM to achieve full ROM      Assessment/Plan    PT Assessment Patient needs continued PT services  PT Diagnosis Quadraplegia;Acute pain   PT Problem List Decreased strength;Decreased range of motion;Decreased activity tolerance;Decreased balance;Decreased mobility;Decreased coordination;Decreased knowledge of use of DME;Decreased safety awareness;Decreased knowledge of precautions;Decreased skin integrity;Pain;Impaired sensation  PT Treatment Interventions DME instruction;Functional mobility training;Therapeutic activities;Therapeutic exercise;Balance training;Neuromuscular re-education;Patient/family education;Wheelchair mobility training;Manual techniques   PT  Goals (Current goals can be found in the Care Plan section) Acute Rehab PT Goals Patient Stated Goal: get stronger, w/c mobility PT Goal Formulation: With patient Time For Goal Achievement: 12/02/15 Potential to Achieve Goals: Good    Frequency Min 3X/week   Barriers to discharge        Co-evaluation PT/OT/SLP Co-Evaluation/Treatment: Yes Reason for Co-Treatment: For patient/therapist safety;Complexity of the patient's impairments (multi-system involvement) PT goals addressed during session: Mobility/safety with mobility;Balance;Strengthening/ROM OT goals addressed during session: Strengthening/ROM       End of Session   Activity Tolerance: Patient tolerated treatment well;Patient limited by fatigue [name];with call bell/phone within reach Nurse Communication: Mobility status;Need for lift equipment;Precautions (RN assist w/ transfer to air mattress using lift)         Time: [id number] PT Time Calculation (min) (ACUTE ONLY): 44 min   Charges:   PT Evaluation $Initial PT Evaluation Tier I: 1 Procedure     PT G Codes:       Joslyn Hy PT, DPT [phone] Pager: [phone] 11/11/2015, 12:39 PM

## 2015-11-11 NOTE — Consult Note (Signed)
WOC wound consult note Reason for Consult: sacral and LE pressure injuries Reviewed chart, orthopedics to take patient to OR today for I & D of the foot wounds.  I will hold off on any further recommendations for this, will follow along with ortho. Plastic surgery has followed sacral ulcer, however patient reports he has not seen plastic surgery for follow up with his sacral ulcer since surgery in September. Wound type: Stage 4 pressure injury sacrum: 10 cm x 8 cm x 0.5cm Stage 2 pressure injury right buttock: 2cm x 1cm x 0 Stage 4 pressure injury right malleolus: 2cm x 2.5cm x 1.5cm  Unstageable pressure injury right lateral heel:  3.5cm x 1.0cm x 1.0cm Stage 3 pressure injury left lateral malleolus: 1.0cm x 1.0cm x 0.3cm  Stage 3 pressure injury right posterior distal calf: 8.0cm x 2.0cm x 0.1cm   Pressure Ulcer POA: Yes/No Measurement: see above Wound bed: Stage 4 pressure injury sacrum: 100% pink, non granular Stage 2 pressure injury right buttock: partial thickness, 90% darkened center; 10% pink at edges Stage 4 pressure injury right malleolus: 90% yellow with exposed hardware at the base Unstageable pressure injury right lateral heel: 100% yellow Stage 3 pressure injury left lateral malleolus: 50% yellow; 50% pink in the base of the wound Stage 3 pressure injury right posterior distal calf: 100% pink, healing  Drainage (amount, consistency, odor)  Minimal from the sacral /buttock wounds; the right foot wounds have heavy, yellow/purulent drainage, no odor Periwound: foot wounds have some maceration noted from the heavy drainage. Dressing procedure/placement/frequency: Add air mattress for offloading the sacral pressure injury.  I will add Prevelon boots after orthopedic intervention.  No topical care added at this time for the bilateral foot wounds; patient to surgery today per ortho.  Continue sacral foam for the sacral wound due to no real depth at the current time and the wounds are  clean.    [place] Team will follow along with you for support with wound care as needed. Margerite Impastato Brandon RN,CWOCN [id number]

## 2015-11-11 NOTE — Progress Notes (Signed)
Initial Nutrition Assessment  DOCUMENTATION CODES:   Not applicable  INTERVENTION:    Ensure Enlive PO TID once diet advanced, each supplement provides 350 kcal and 20 grams of protein  Continue MVI daily  NUTRITION DIAGNOSIS:   Increased nutrient needs related to wound healing as evidenced by estimated needs.  GOAL:   Patient will meet greater than or equal to 90% of their needs  MONITOR:   PO intake, Supplement acceptance, Labs, Weight trends, Skin  REASON FOR ASSESSMENT:   Low Braden    ASSESSMENT:   49 year old male with a past medical history of a recent cervical spin injury (05/2015) and resulting in quadriplegia and R ankle fracture with hardware placement who presents with right ankle discomfort. He's noticed it worsening over the past month and when he went to his weekly wound care clinic, they noticed purulent drainage and exposure of the hardware in the R ankle.   Labs reviewed: potassium, BUN, creatinine low.  Patient worked with PT this AM. Per RN, patient able to move his arms some, but he cannot grip things with his hands. He is NPO until surgery to clean out ankle wound this afternoon. Patient reports that he usually eats very well. He requires assistance with feeding. He likes Ensure supplements, strawberry flavor. Some muscle depletion expected with quadraplegia. Patient with increased nutrient needs to support wound healing.   Diet Order:  Diet NPO time specified  Skin:  Wound (see comment) (stg 4 R foot & sacrum; stg 3 L ankle & R leg, unstageable to heel; stg 2 buttocks)  Last BM:  11/30  Height:   Ht Readings from Last 1 Encounters:  11/10/15 5\' 9"  (1.753 m)    Weight:   Wt Readings from Last 1 Encounters:  11/10/15 155 lb 10.3 oz (70.6 kg)    Ideal Body Weight:  65.5 kg  BMI:  Body mass index is 22.97 kg/(m^2).  Estimated Nutritional Needs:   Kcal:  [id number]  Protein:  115-130 gm  Fluid:  2.3-2.5 L  EDUCATION NEEDS:    Education needs addressed; discussed importance of adequate protein intake for wound healing.   Molli Barrows, RD, LDN, [place] Pager [phone] After Hours Pager [phone]
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
## 2015-11-12 DIAGNOSIS — S14109S Unspecified injury at unspecified level of cervical spinal cord, sequela: Secondary | ICD-10-CM

## 2015-11-12 LAB — CBC
HCT: 21.3 % — ABNORMAL LOW (ref 39.0–52.0)
HCT: 26 % — ABNORMAL LOW (ref 39.0–52.0)
HEMOGLOBIN: 6.5 g/dL — AB (ref 13.0–17.0)
Hemoglobin: 8.4 g/dL — ABNORMAL LOW (ref 13.0–17.0)
MCH: 26.2 pg (ref 26.0–34.0)
MCH: 27.7 pg (ref 26.0–34.0)
MCHC: 30.5 g/dL (ref 30.0–36.0)
MCHC: 32.3 g/dL (ref 30.0–36.0)
MCV: 85.9 fL (ref 78.0–100.0)
MCV: 85.8 fL (ref 78.0–100.0)
Platelets: 417 10*3/uL — ABNORMAL HIGH (ref 150–400)
Platelets: 448 10*3/uL — ABNORMAL HIGH (ref 150–400)
RBC: 2.48 MIL/uL — AB (ref 4.22–5.81)
RBC: 3.03 MIL/uL — ABNORMAL LOW (ref 4.22–5.81)
RDW: 14.9 % (ref 11.5–15.5)
RDW: 14.5 % (ref 11.5–15.5)
WBC: 8 10*3/uL (ref 4.0–10.5)
WBC: 9.5 10*3/uL (ref 4.0–10.5)

## 2015-11-12 LAB — BASIC METABOLIC PANEL
Anion gap: 7 (ref 5–15)
BUN: <5 mg/dL — ABNORMAL LOW (ref 6–20)
CHLORIDE: 106 mmol/L (ref 101–111)
CO2: 26 mmol/L (ref 22–32)
Calcium: 8.6 mg/dL — ABNORMAL LOW (ref 8.9–10.3)
Creatinine, Ser: 0.46 mg/dL — ABNORMAL LOW (ref 0.61–1.24)
GFR calc non Af Amer: >60 mL/min (ref >60)
Glucose, Bld: 97 mg/dL (ref 65–99)
POTASSIUM: 4 mmol/L (ref 3.5–5.1)
SODIUM: 139 mmol/L (ref 135–145)

## 2015-11-12 LAB — PREPARE RBC (CROSSMATCH)

## 2015-11-12 LAB — VANCOMYCIN, TROUGH: Vancomycin Tr: 12 ug/mL (ref 10.0–20.0)

## 2015-11-12 MED ORDER — METOPROLOL TARTRATE 12.5 MG HALF TABLET
12.5000 mg | ORAL_TABLET | Freq: Two times a day (BID) | ORAL | Status: DC
Start: 2015-11-12 — End: 2015-11-12
  Administered 2015-11-12: 12.5 mg via ORAL
  Filled 2015-11-12: qty 1

## 2015-11-12 MED ORDER — METOPROLOL TARTRATE 25 MG PO TABS
25.0000 mg | ORAL_TABLET | Freq: Two times a day (BID) | ORAL | Status: DC
  Administered 2015-11-12 – 2015-11-13 (×2): 25 mg via ORAL
  Filled 2015-11-12: qty 1

## 2015-11-12 MED ORDER — SODIUM CHLORIDE 0.9 % IV SOLN
Freq: Once | INTRAVENOUS | Status: AC
  Administered 2015-11-12: 11:00:00 via INTRAVENOUS

## 2015-11-12 MED ORDER — SODIUM CHLORIDE 0.9 % IV SOLN
INTRAVENOUS | Status: DC
  Administered 2015-11-12: 14:00:00 via INTRAVENOUS

## 2015-11-12 NOTE — Anesthesia Postprocedure Evaluation (Signed)
Anesthesia Post Note  Patient: Phillip [name]  Procedure(s) Performed: Procedure(s) (LRB): IRRIGATION AND DEBRIDEMENT RIGHT ANKLE (Right) HARDWARE REMOVAL RIGHT ANKLE (Right) APPLICATION OF WOUND VAC (Right)  Patient location during evaluation: PACU Anesthesia Type: General Level of consciousness: awake and awake and alert Pain management: pain level controlled Vital Signs Assessment: post-procedure vital signs reviewed and stable Anesthetic complications: no    Last Vitals:  Filed Vitals:   11/11/15 2011 11/12/15 0003  BP: 106/78   Pulse: 103   Temp: 36.9 C 36.7 C  Resp: 18     Last Pain:  Filed Vitals:   11/12/15 0003  PainSc: Asleep                 Natacia Chaisson COKER

## 2015-11-12 NOTE — Progress Notes (Signed)
Pt stable vacs in place right leg cxs pending - abx beads also in place Difficult problem all around

## 2015-11-12 NOTE — Op Note (Signed)
NAME:  Phillip [name], Phillip [name] NO.:  [id number]  MEDICAL RECORD NO.:  [id number]  LOCATION:  3S02C                        FACILITY:  [facility]  PHYSICIAN:  Anderson Malta, M.D.    DATE OF BIRTH:  [birth year]  DATE OF PROCEDURE: DATE OF DISCHARGE:                              OPERATIVE REPORT   PREOPERATIVE DIAGNOSIS:  Right ankle infection and heel pressure ulcer.  POSTOPERATIVE DIAGNOSIS:  Right ankle infection and heel pressure ulcer.  PROCEDURE:  Removal of hardware, right ankle with debridement of ulcer, removal of extensive debridement of an infected-appearing skin, subcutaneous tissue, muscle fascia, as well as the lateral fibula, and placement of antibiotic beads and closure of wound VAC of the lateral ankle incision with debridement of heel ulcer not including bone, foot, but is including skin, subcutaneous tissue, and fascia with placement of wound VAC.  SURGEON:  Anderson Malta, MD  ASSISTANT:  None.  ANESTHESIA:  General.  INDICATIONS:  [name] is a 49 year old patient with right ankle hardware placed for ankle fracture 6 months ago.  Did well, but has been laying with his feet externally rotated, developed ulcer on the right lateral malleolar region which has been progressive, saw me in clinic this week.  He has a quarter-sized area open over the lateral malleolus with hardware exposed, then pus draining from the incision presents now for operative management.  PROCEDURE IN DETAIL:  Patient was brought to operating room, where general anesthetic was induced.  Perioperative antibiotics were administered.  Time-out was called.  Right leg was prepped with Hibiclens, saline draped in sterile manner.  The patient had about 4 x 1 cm pressure ulcer on the heel which was a full-thickness, but not down the bone.  The patient also had about a quarter-sized area of the lateral malleolus with hardware exposed.  Prior incision was utilized. Skin was incised.   Hardware was removed.  Bone was infected.  Bone was removed as well.  Following this, thorough excisional debridement performed, thorough irrigation with 6 L of irrigating solution performed.  Antibiotic beads placed.  Incision loosely reapproximated using 0 Vicryl and 2-0 nylon.  Heel was debrided.  Wound VAC placed as well.  Prevena incisional VAC placed over the lateral incision.  The wound VAC normal, placed over the heel ulceration both set at 75 mg continuous suction.  The patient tolerated the procedure well without immediate complication and transferred to recovery in stable condition.     Anderson Malta, M.D.     GSD/MEDQ  D:  11/11/2015  T:  11/12/2015  Job:  [id number]

## 2015-11-12 NOTE — Progress Notes (Signed)
ANTIBIOTIC CONSULT NOTE - FOLLOW UP  Pharmacy Consult for Vancomycin and Zosyn Indication: rule out sepsis 2/2 right ankle ulcer  No Known Allergies  Patient Measurements: Height: 5\' 9"  (175.3 cm) Weight: 155 lb 10.3 oz (70.6 kg) IBW/kg (Calculated) : 70.7  Vital Signs: Temp: 99.5 F (37.5 C) (12/03 1120) Temp Source: Oral (12/03 1120) BP: 109/66 mmHg (12/03 1120) Pulse Rate: 111 (12/03 1120) Intake/Output from previous day: 12/02 0701 - 12/03 0700 In: 1750 [I.V.:1350; IV Piggyback:400] Out: 1525 [Urine:1475; Blood:50] Intake/Output from this shift: Total I/O In: 10 [I.V.:10] Out: 500 [Urine:500]  Labs:  Recent Labs  11/10/15 1050 11/11/15 0338 11/12/15 0456  WBC 10.6* 8.1 8.0  HGB 9.0* 7.5* 6.5*  PLT 429* 370 417*  CREATININE 0.60* 0.51* 0.46*   Estimated Creatinine Clearance: 115.2 mL/min (by C-G formula based on Cr of 0.46).  Recent Labs  11/12/15 0945  [place] 12     Microbiology: Recent Results (from the past 720 hour(s))  Blood Culture (routine x 2)     Status: None (Preliminary result)   Collection Time: 11/10/15 10:50 AM  Result Value Ref Range Status   Specimen Description BLOOD RIGHT ARM  Final   Special Requests BOTTLES DRAWN AEROBIC AND ANAEROBIC 5CC  Final   Culture NO GROWTH 1 DAY  Final   Report Status PENDING  Incomplete  Blood Culture (routine x 2)     Status: None (Preliminary result)   Collection Time: 11/10/15 10:50 AM  Result Value Ref Range Status   Specimen Description BLOOD RIGHT FOREARM  Final   Special Requests BOTTLES DRAWN AEROBIC AND ANAEROBIC 5CC  Final   Culture  Setup Time   Final    GRAM POSITIVE COCCI IN CLUSTERS ANAEROBIC BOTTLE ONLY CRITICAL RESULT CALLED TO, READ BACK BY AND VERIFIED WITH: Edythe Lynn AT 1149 11/11/15 BY L BENFIELD    Culture GRAM POSITIVE COCCI  Final   Report Status PENDING  Incomplete  Urine culture     Status: None (Preliminary result)   Collection Time: 11/10/15 10:50 AM  Result Value  Ref Range Status   Specimen Description URINE, CATHETERIZED  Final   Special Requests NONE  Final   Culture CULTURE REINCUBATED FOR BETTER GROWTH  Final   Report Status PENDING  Incomplete  Wound culture     Status: None (Preliminary result)   Collection Time: 11/10/15  1:40 PM  Result Value Ref Range Status   Specimen Description WOUND LEFT ANKLE  Final   Special Requests Normal  Final   Gram Stain   Final    NO WBC SEEN NO SQUAMOUS EPITHELIAL CELLS SEEN NO ORGANISMS SEEN Performed at [facility]    Culture   Final    Culture reincubated for better growth Performed at [facility]    Report Status PENDING  Incomplete  MRSA PCR Screening     Status: None   Collection Time: 11/10/15  6:30 PM  Result Value Ref Range Status   MRSA by PCR NEGATIVE NEGATIVE Final    Comment:        The GeneXpert MRSA Assay (FDA approved for NASAL specimens only), is one component of a comprehensive MRSA colonization surveillance program. It is not intended to diagnose MRSA infection nor to guide or monitor treatment for MRSA infections.     Anti-infectives    Start     Dose/Rate Route Frequency Ordered Stop   11/11/15 1918  vancomycin (VANCOCIN) powder  Status:  Discontinued  As needed 11/11/15 1918 11/11/15 1930   11/11/15 1917  gentamicin (GARAMYCIN) injection  Status:  Discontinued       As needed 11/11/15 1918 11/11/15 1930   11/10/15 2300  piperacillin-tazobactam (ZOSYN) IVPB 3.375 g     3.375 g 12.5 mL/hr over 240 Minutes Intravenous Every 8 hours 11/10/15 1814     11/10/15 2300  vancomycin (VANCOCIN) IVPB 750 mg/150 ml premix     750 mg 150 mL/hr over 60 Minutes Intravenous Every 12 hours 11/10/15 1814     11/10/15 1700  piperacillin-tazobactam (ZOSYN) IVPB 3.375 g     3.375 g 100 mL/hr over 30 Minutes Intravenous  Once 11/10/15 1601 11/10/15 1659   11/10/15 1030  piperacillin-tazobactam (ZOSYN) IVPB 3.375 g     3.375 g 100 mL/hr over 30 Minutes  Intravenous  Once 11/10/15 1029 11/10/15 1138   11/10/15 1030  vancomycin (VANCOCIN) IVPB 1000 mg/200 mL premix     1,000 mg 200 mL/hr over 60 Minutes Intravenous  Once 11/10/15 1029 11/10/15 1302      Assessment: 49 yo male with history of seizures and spinal cord injury resulting in quadriplegia presents with possible R ankle infection.  OR 12/2 for I&D of R ankle, removal of hardware, application of wound vac.  Today is day # 3 of IV antibiotic therapy.  Vancomycin trough is < goal.  Renal function is wnl, although difficult to assess given quadriplegia.  Goal of Therapy:  Vancomycin trough level 15-20 mcg/ml  Plan:  Increase Vancomycin 1000 mg IV q12h (estimated tr ~ 16) Zosyn 3.375g IV q8h Measure antibiotic drug levels at steady state Follow up culture results, renal function and clinical course  [employer], Pharm.D., BCPS Clinical Pharmacist Pager [phone] 11/12/2015 12:12 PM
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
## 2015-11-13 LAB — CBC
HCT: 27.1 % — ABNORMAL LOW (ref 39.0–52.0)
HEMOGLOBIN: 8.3 g/dL — AB (ref 13.0–17.0)
MCH: 26.4 pg (ref 26.0–34.0)
MCHC: 30.6 g/dL (ref 30.0–36.0)
MCV: 86.3 fL (ref 78.0–100.0)
PLATELETS: 436 10*3/uL — AB (ref 150–400)
RBC: 3.14 MIL/uL — ABNORMAL LOW (ref 4.22–5.81)
RDW: 14.8 % (ref 11.5–15.5)
WBC: 8.1 10*3/uL (ref 4.0–10.5)

## 2015-11-13 LAB — BASIC METABOLIC PANEL
ANION GAP: 6 (ref 5–15)
BUN: <5 mg/dL — ABNORMAL LOW (ref 6–20)
CHLORIDE: 107 mmol/L (ref 101–111)
CO2: 28 mmol/L (ref 22–32)
Calcium: 8.6 mg/dL — ABNORMAL LOW (ref 8.9–10.3)
Creatinine, Ser: 0.52 mg/dL — ABNORMAL LOW (ref 0.61–1.24)
GFR calc non Af Amer: >60 mL/min (ref >60)
Glucose, Bld: 104 mg/dL — ABNORMAL HIGH (ref 65–99)
Potassium: 3.9 mmol/L (ref 3.5–5.1)
Sodium: 141 mmol/L (ref 135–145)

## 2015-11-13 LAB — URINE CULTURE

## 2015-11-13 MED ORDER — CARVEDILOL 6.25 MG PO TABS
6.2500 mg | ORAL_TABLET | Freq: Two times a day (BID) | ORAL | Status: DC
  Administered 2015-11-13: 6.25 mg via ORAL
  Filled 2015-11-13 (×2): qty 1

## 2015-11-13 MED ORDER — VANCOMYCIN HCL IN DEXTROSE 1-5 GM/200ML-% IV SOLN
1000.0000 mg | Freq: Two times a day (BID) | INTRAVENOUS | Status: DC
  Administered 2015-11-13 – 2015-11-15 (×4): 1000 mg via INTRAVENOUS
  Filled 2015-11-13 (×5): qty 200

## 2015-11-13 MED ORDER — HYDRALAZINE HCL 10 MG PO TABS
10.0000 mg | ORAL_TABLET | Freq: Three times a day (TID) | ORAL | Status: DC
  Administered 2015-11-13 (×2): 10 mg via ORAL
  Filled 2015-11-13 (×2): qty 1

## 2015-11-13 NOTE — Progress Notes (Signed)
Pt resting Minimal drainage from lateral ankle vac Fevers persisting - wbc ok Heel wound vac to be changed tomorrow - need to leave the incisional provena vac in place Could consider ankle scan end next week to r/o infection outside of distal lateral malleolar area

## 2015-11-13 NOTE — Care Management Note (Addendum)
Case Management Note  Patient Details  Name: FLETCHER HILLMER MRN: [id number] Date of Birth: 06-Sep-1966  Subjective/Objective:                 Admitted with Sepsis from snf, past medical history recent cervical spin injury (05/2015)/quadriplegia, chronic foley and R ankle fracture with hardware placement who presents with right ankle discomfort. He endorses a remote history of alcohol withdrawal seizures. He smokes 2 cigarettes per day. S/P IRRIGATION AND DEBRIDEMENT RIGHT ANKLE HARDWARE REMOVAL RIGHT ANKLE APPLICATION OF WOUND VAC  11/12/15.  Action/Plan: Return to SNF vs CIR (OT's recommendation - rehab consult inplace)  when medically stable. CM to f/u with disposition needs.  Expected Discharge Date:                  Expected Discharge Plan:  [facility])  In-House Referral:  Clinical Social Work  Discharge planning Services  CM Consult  Post Acute Care Choice:    Choice offered to:     DME Arranged:    DME Agency:     HH Arranged:    HH Agency:     Status of Service:  In process, will continue to follow  Medicare Important Message Given:    Date Medicare IM Given:    Medicare IM give by:    Date Additional Medicare IM Given:    Additional Medicare Important Message give by:     If discussed at [place] of Stay Meetings, dates discussed:    Additional Comments:   Gwenyth Bender [phone], [name] (Mother)  [phone]  Whitman Hero [place], [place] [phone] 11/13/2015, 6:15 PM
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
## 2015-11-14 ENCOUNTER — Encounter (HOSPITAL_COMMUNITY): Payer: Self-pay | Admitting: Orthopedic Surgery

## 2015-11-14 LAB — CBC
HCT: 28.1 % — ABNORMAL LOW (ref 39.0–52.0)
Hemoglobin: 8.8 g/dL — ABNORMAL LOW (ref 13.0–17.0)
MCH: 27.2 pg (ref 26.0–34.0)
MCHC: 31.3 g/dL (ref 30.0–36.0)
MCV: 87 fL (ref 78.0–100.0)
PLATELETS: 525 10*3/uL — AB (ref 150–400)
RBC: 3.23 MIL/uL — AB (ref 4.22–5.81)
RDW: 14.7 % (ref 11.5–15.5)
WBC: 8.8 10*3/uL (ref 4.0–10.5)

## 2015-11-14 LAB — WOUND CULTURE
Gram Stain: NONE SEEN
SPECIAL REQUESTS: NORMAL

## 2015-11-14 LAB — CULTURE, BLOOD (ROUTINE X 2)

## 2015-11-14 LAB — BASIC METABOLIC PANEL
ANION GAP: 9 (ref 5–15)
BUN: 6 mg/dL (ref 6–20)
CO2: 28 mmol/L (ref 22–32)
Calcium: 9.1 mg/dL (ref 8.9–10.3)
Chloride: 101 mmol/L (ref 101–111)
Creatinine, Ser: 0.54 mg/dL — ABNORMAL LOW (ref 0.61–1.24)
GFR calc non Af Amer: >60 mL/min (ref >60)
Glucose, Bld: 119 mg/dL — ABNORMAL HIGH (ref 65–99)
POTASSIUM: 4.1 mmol/L (ref 3.5–5.1)
SODIUM: 138 mmol/L (ref 135–145)

## 2015-11-14 MED ORDER — METOPROLOL TARTRATE 12.5 MG HALF TABLET
12.5000 mg | ORAL_TABLET | Freq: Two times a day (BID) | ORAL | Status: DC
  Administered 2015-11-14 – 2015-11-15 (×3): 12.5 mg via ORAL
  Filled 2015-11-14 (×3): qty 1

## 2015-11-14 MED ORDER — AMLODIPINE BESYLATE 10 MG PO TABS
10.0000 mg | ORAL_TABLET | Freq: Every day | ORAL | Status: DC
  Administered 2015-11-14 – 2015-11-15 (×2): 10 mg via ORAL
  Filled 2015-11-14 (×2): qty 1
  Filled 2015-11-14: qty 2

## 2015-11-14 NOTE — Progress Notes (Signed)
CM received consult for LTAC. CM made referrals with [facility](Brian) @336 -[phone] and [facility](Andrea) @336 -[phone]. CM awaiting response. Whitman Hero RN,BSN,CM [phone]

## 2015-11-14 NOTE — Progress Notes (Addendum)
Pt with medicaid.  [facility] and [facility](Andrea[facility] both informed CM facilities are unable to accept pt 2/2 medicaid will not pay for LTAC. CM spoke with CSW  and shared information. CSW to f/u with snf for placement. Whitman Hero RN,CM,BSN [phone]

## 2015-11-14 NOTE — Progress Notes (Signed)
Pt stable No fevers 48 hours Foot right looks ok Needs heel wound vac changed 3 x week for 2 weeks Ok for dc from ortho standpoint
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
## 2015-11-14 NOTE — Clinical Social Work Note (Signed)
Clinical Social Work Assessment  Patient Details  Name: Phillip [name] MRN: [id number] Date of Birth: Oct 19, 1966  Date of referral:  11/14/15               Reason for consult:  Discharge Planning, Facility Placement                Permission sought to share information with:  Facility Art therapist granted to share information::  Yes, Verbal Permission Granted  Name::        Agency::  [facility] (patient long term resident at facility)  Relationship::     Contact Information:     Housing/Transportation Living arrangements for the past 2 months:  [place] of Information:  Patient Patient Interpreter Needed:  None Criminal Activity/Legal Involvement Pertinent to Current Situation/Hospitalization:  No - Comment as needed Significant Relationships:  Other(Comment) (Patient did not disclose.) Lives with:  Facility Resident Do you feel safe going back to the place where you live?  Yes Need for family participation in patient care:  No (Coment) (Patient able to make own decisions.)  Care giving concerns:  Patient expressed no concerns at this time.   Social Worker assessment / plan:  CSW received referral stating patient admitted from facility [facility]). CSW met with patient at bedside to discuss discharge plan. Per patient, patient has been a resident at [facility] since July of 2016 and plans to return to [facility] once medically stable for discharge. CSW updated [facility] facility admission coordinator regarding patient's plan to return once discharged. CSW to continue to follow and assist with discharge planning needs.  Employment status:  Disabled (Comment on whether or not currently receiving Disability) Insurance information:  Medicaid [place] PT Recommendations:  Inpatient Rehab Consult Information / Referral to community resources:  [place]  Patient/Family's Response to care:  Patient understanding and agreeable  to CSW plan of care.  Patient/Family's Understanding of and Emotional Response to Diagnosis, Current Treatment, and Prognosis:  Patient understanding and agreeable to CSW plan of care.  Emotional Assessment Appearance:  Appears stated age Attitude/Demeanor/Rapport:  Lethargic Affect (typically observed):  Accepting, Appropriate, Quiet, Pleasant Orientation:  Oriented to Self, Oriented to Place, Oriented to  Time, Oriented to Situation Alcohol / Substance use:  Not Applicable Psych involvement (Current and /or in the community):  No (Comment) (Not appropriate on this admission.)  Discharge Needs  Concerns to be addressed:  No discharge needs identified Readmission within the last 30 days:  No Current discharge risk:  None Barriers to Discharge:  No Barriers Identified   Caroline Sauger, LCSW 11/14/2015, 5:46 PM [phone]

## 2015-11-14 NOTE — Consult Note (Signed)
WOC wound follow up Wound type: surgical RLQ lateral: with incisional NPWT (Prevena) dressing, Yconnected to the [facility] machine in the room R lateral heel: traditional NPWT VAC dressing Measurement: Did not assess lateral leg wound, under dressing Right lateral heel: 3cm x 1cm x 0.5cm  Wound bed: Right lateral heel: early granulation, pink, moist Drainage (amount, consistency, odor) serosanguinous  Periwound: macerated at heel site Dressing procedure/placement/frequency: Periwound protected with VAC drape due to maceration.  1pc of black foam used to cover the heel wound, draped and sealed at 41mmHG per MD orders.  Prevalon boot in place for offloading site.  Site noted just distal of the heel wound, purple does not blanch 2.5cm x 3.0cm x 0. Was under VAC drape at the time of my assessment. Skin intact, will monitor.  [facility] Team will follow along with you for support with wound care.  [place], [place]
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
## 2015-11-14 NOTE — Progress Notes (Signed)
PT Cancellation Note  Patient Details Name: Phillip [name] MRN: [id number] DOB: 11-04-1966   Cancelled Treatment:    Reason Eval/Treat Not Completed: Other (comment) (pt just getting A for eating breackfast.)  Will f/u another time.   Ardene Remley, Thornton Papas 11/14/2015, 10:53 AM
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
## 2015-11-14 NOTE — Progress Notes (Signed)
Physical medicine rehabilitation consult requested chart ongoing with reviewed. Patient resides skilled nursing facility at [facility]. Not a candidate this time for inpatient rehabilitation services recommendations are to return back to skilled nursing facility
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
## 2015-11-15 DIAGNOSIS — A419 Sepsis, unspecified organism: Secondary | ICD-10-CM

## 2015-11-15 DIAGNOSIS — B9562 Methicillin resistant Staphylococcus aureus infection as the cause of diseases classified elsewhere: Secondary | ICD-10-CM

## 2015-11-15 DIAGNOSIS — G8250 Quadriplegia, unspecified: Secondary | ICD-10-CM

## 2015-11-15 DIAGNOSIS — L89519 Pressure ulcer of right ankle, unspecified stage: Secondary | ICD-10-CM

## 2015-11-15 LAB — WOUND CULTURE

## 2015-11-15 LAB — CULTURE, BLOOD (ROUTINE X 2): Culture: NO GROWTH

## 2015-11-15 MED ORDER — AMLODIPINE BESYLATE 10 MG PO TABS: 10.0000 mg | ORAL_TABLET | Freq: Every day | ORAL | Status: DC

## 2015-11-15 MED ORDER — HYDROCODONE-ACETAMINOPHEN 7.5-325 MG PO TABS: 1.0000 | ORAL_TABLET | ORAL | Status: DC | PRN

## 2015-11-15 MED ORDER — CHLORHEXIDINE GLUCONATE CLOTH 2 % EX PADS
6.0000 | MEDICATED_PAD | Freq: Every day | CUTANEOUS | Status: DC
  Administered 2015-11-15: 6 via TOPICAL

## 2015-11-15 MED ORDER — DOXYCYCLINE HYCLATE 100 MG PO TABS
200.0000 mg | ORAL_TABLET | Freq: Two times a day (BID) | ORAL | Status: DC
  Administered 2015-11-15: 200 mg via ORAL
  Filled 2015-11-15: qty 2

## 2015-11-15 MED ORDER — MUPIROCIN 2 % EX OINT: 1.0000 "application " | TOPICAL_OINTMENT | Freq: Two times a day (BID) | CUTANEOUS | Status: DC

## 2015-11-15 MED ORDER — DOXYCYCLINE HYCLATE 100 MG PO TABS: 200.0000 mg | ORAL_TABLET | Freq: Two times a day (BID) | ORAL | Status: AC
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
## 2015-11-15 NOTE — Clinical Social Work Note (Signed)
Phillip [name] will discharge back to [facility] this evening. Discharge information transmitted to facility and patient will be transported by ambulance ([facility]). Patient's family members, [name] [phone]) and [name] [phone]) contacted and informed of discharge and ambulance transport.   Jathniel Smeltzer Givens, MSW, LCSW Licensed Clinical Social Worker [place] [phone]

## 2015-11-15 NOTE — Progress Notes (Addendum)
Pt for discharge back to SNF with wound vac dressings. CM called [facility]/ SNF and verified to leave wound vac dressing in place @ d/c and facility will place vac/pump to dressing once pt arrives @ facility per [facility]. CM made discharging nurse aware. Whitman Hero RN,BSN,CM [phone]

## 2015-11-15 NOTE — Discharge Instructions (Signed)
Phillip [name], you will continue antibiotics for four more days for your foot infection. We are working to provide you exercise bands.   If you experience chest pain or shortness of breath, please let staff at your skilled nursing facility know.  Happy Birthday.

## 2015-11-15 NOTE — Progress Notes (Signed)
  Date: 11/15/2015  Patient name: Phillip [name]  Medical record number: [id number]  Date of birth: [birth year]   This patient has been seen and the plan of care was discussed with the house staff. Please see their note for complete details. I concur with their findings with the following additions/corrections: Phillip [name] was seen on morning rounds. He has no complaints. He is not eligible for PT, inpt rehab, or SNF with PT 2/2 insurance status which is concerning bc he is young, motivated, and could build upper strength to help with repositioning and relieving pressure and thereby possibly preventing further wounds. We will see if PT can provide exercises to do and the bands before he leaves so that maybe he can exercise on his own? Stable for D/C.   Bartholomew Crews, MD 11/15/2015, 1:49 PM

## 2015-11-16 LAB — TYPE AND SCREEN
ABO/RH(D): O POS
Antibody Screen: NEGATIVE
UNIT DIVISION: 0
UNIT DIVISION: 0

## 2015-11-17 LAB — CULTURE, BLOOD (ROUTINE X 2)
CULTURE: NO GROWTH
Culture: NO GROWTH

## 2015-11-18 LAB — ANAEROBIC CULTURE

## 2015-11-26 ENCOUNTER — Emergency Department (HOSPITAL_COMMUNITY): Payer: Medicaid Other

## 2015-11-26 ENCOUNTER — Encounter (HOSPITAL_COMMUNITY): Payer: Self-pay | Admitting: *Deleted

## 2015-11-26 ENCOUNTER — Inpatient Hospital Stay (HOSPITAL_COMMUNITY)
Admission: EM | Admit: 2015-11-26 | Discharge: 2015-12-02 | DRG: 698 | Disposition: A | Discharge status: Skilled Nursing Facility | Payer: Medicaid Other | Attending: Oncology | Admitting: Oncology

## 2015-11-26 ENCOUNTER — Inpatient Hospital Stay (HOSPITAL_COMMUNITY): Payer: Medicaid Other

## 2015-11-26 DIAGNOSIS — R6520 Severe sepsis without septic shock: Secondary | ICD-10-CM | POA: Diagnosis present

## 2015-11-26 DIAGNOSIS — S14109S Unspecified injury at unspecified level of cervical spinal cord, sequela: Secondary | ICD-10-CM | POA: Diagnosis not present

## 2015-11-26 DIAGNOSIS — N139 Obstructive and reflux uropathy, unspecified: Secondary | ICD-10-CM | POA: Diagnosis present

## 2015-11-26 DIAGNOSIS — L89614 Pressure ulcer of right heel, stage 4: Secondary | ICD-10-CM | POA: Diagnosis present

## 2015-11-26 DIAGNOSIS — T83091A Other mechanical complication of indwelling urethral catheter, initial encounter: Principal | ICD-10-CM | POA: Diagnosis present

## 2015-11-26 DIAGNOSIS — R402432 Glasgow coma scale score 3-8, at arrival to emergency department: Secondary | ICD-10-CM | POA: Diagnosis present

## 2015-11-26 DIAGNOSIS — G9341 Metabolic encephalopathy: Secondary | ICD-10-CM | POA: Diagnosis present

## 2015-11-26 DIAGNOSIS — D509 Iron deficiency anemia, unspecified: Secondary | ICD-10-CM | POA: Diagnosis present

## 2015-11-26 DIAGNOSIS — S14109A Unspecified injury at unspecified level of cervical spinal cord, initial encounter: Secondary | ICD-10-CM | POA: Diagnosis not present

## 2015-11-26 DIAGNOSIS — A419 Sepsis, unspecified organism: Secondary | ICD-10-CM | POA: Diagnosis present

## 2015-11-26 DIAGNOSIS — R569 Unspecified convulsions: Secondary | ICD-10-CM | POA: Diagnosis present

## 2015-11-26 DIAGNOSIS — N390 Urinary tract infection, site not specified: Secondary | ICD-10-CM | POA: Diagnosis present

## 2015-11-26 DIAGNOSIS — Y846 Urinary catheterization as the cause of abnormal reaction of the patient, or of later complication, without mention of misadventure at the time of the procedure: Secondary | ICD-10-CM | POA: Diagnosis present

## 2015-11-26 DIAGNOSIS — L89153 Pressure ulcer of sacral region, stage 3: Secondary | ICD-10-CM | POA: Diagnosis present

## 2015-11-26 DIAGNOSIS — R40243 Glasgow coma scale score 3-8, unspecified time: Secondary | ICD-10-CM

## 2015-11-26 DIAGNOSIS — G8250 Quadriplegia, unspecified: Secondary | ICD-10-CM | POA: Diagnosis present

## 2015-11-26 DIAGNOSIS — R739 Hyperglycemia, unspecified: Secondary | ICD-10-CM | POA: Diagnosis present

## 2015-11-26 DIAGNOSIS — L8990 Pressure ulcer of unspecified site, unspecified stage: Secondary | ICD-10-CM | POA: Diagnosis present

## 2015-11-26 DIAGNOSIS — S14105A Unspecified injury at C5 level of cervical spinal cord, initial encounter: Secondary | ICD-10-CM | POA: Diagnosis present

## 2015-11-26 DIAGNOSIS — N179 Acute kidney failure, unspecified: Secondary | ICD-10-CM | POA: Diagnosis present

## 2015-11-26 DIAGNOSIS — I959 Hypotension, unspecified: Secondary | ICD-10-CM | POA: Diagnosis present

## 2015-11-26 DIAGNOSIS — D649 Anemia, unspecified: Secondary | ICD-10-CM | POA: Diagnosis present

## 2015-11-26 DIAGNOSIS — Z79899 Other long term (current) drug therapy: Secondary | ICD-10-CM

## 2015-11-26 DIAGNOSIS — E876 Hypokalemia: Secondary | ICD-10-CM | POA: Diagnosis present

## 2015-11-26 DIAGNOSIS — F17210 Nicotine dependence, cigarettes, uncomplicated: Secondary | ICD-10-CM | POA: Diagnosis present

## 2015-11-26 DIAGNOSIS — Z01818 Encounter for other preprocedural examination: Secondary | ICD-10-CM

## 2015-11-26 DIAGNOSIS — I10 Essential (primary) hypertension: Secondary | ICD-10-CM | POA: Diagnosis present

## 2015-11-26 DIAGNOSIS — L89892 Pressure ulcer of other site, stage 2: Secondary | ICD-10-CM | POA: Diagnosis present

## 2015-11-26 DIAGNOSIS — J9601 Acute respiratory failure with hypoxia: Secondary | ICD-10-CM | POA: Diagnosis not present

## 2015-11-26 DIAGNOSIS — R000 Tachycardia, unspecified: Secondary | ICD-10-CM | POA: Insufficient documentation

## 2015-11-26 DIAGNOSIS — R509 Fever, unspecified: Secondary | ICD-10-CM

## 2015-11-26 DIAGNOSIS — Z452 Encounter for adjustment and management of vascular access device: Secondary | ICD-10-CM

## 2015-11-26 LAB — BASIC METABOLIC PANEL
ANION GAP: 11 (ref 5–15)
ANION GAP: 8 (ref 5–15)
BUN: 44 mg/dL — ABNORMAL HIGH (ref 6–20)
BUN: 40 mg/dL — ABNORMAL HIGH (ref 6–20)
CALCIUM: 7.9 mg/dL — AB (ref 8.9–10.3)
CALCIUM: 8.4 mg/dL — AB (ref 8.9–10.3)
CHLORIDE: 109 mmol/L (ref 101–111)
CO2: 18 mmol/L — AB (ref 22–32)
CO2: 19 mmol/L — ABNORMAL LOW (ref 22–32)
Chloride: 111 mmol/L (ref 101–111)
Creatinine, Ser: 2.45 mg/dL — ABNORMAL HIGH (ref 0.61–1.24)
Creatinine, Ser: 1.54 mg/dL — ABNORMAL HIGH (ref 0.61–1.24)
GFR calc non Af Amer: 30 mL/min — ABNORMAL LOW (ref >60)
GFR, EST AFRICAN AMERICAN: 34 mL/min — AB (ref >60)
GFR, EST NON AFRICAN AMERICAN: 52 mL/min — AB (ref >60)
GLUCOSE: 133 mg/dL — AB (ref 65–99)
Glucose, Bld: 112 mg/dL — ABNORMAL HIGH (ref 65–99)
POTASSIUM: 3.6 mmol/L (ref 3.5–5.1)
POTASSIUM: 3.1 mmol/L — AB (ref 3.5–5.1)
SODIUM: 138 mmol/L (ref 135–145)
Sodium: 138 mmol/L (ref 135–145)

## 2015-11-26 LAB — I-STAT ARTERIAL BLOOD GAS, ED
ACID-BASE DEFICIT: 4 mmol/L — AB (ref 0.0–2.0)
Bicarbonate: 19.1 mEq/L — ABNORMAL LOW (ref 20.0–24.0)
O2 SAT: 99 %
PH ART: 7.489 — AB (ref 7.350–7.450)
PO2 ART: 147 mmHg — AB (ref 80.0–100.0)
Patient temperature: 98.6
TCO2: 20 mmol/L (ref 0–100)
pCO2 arterial: 25.2 mmHg — ABNORMAL LOW (ref 35.0–45.0)

## 2015-11-26 LAB — COMPREHENSIVE METABOLIC PANEL
ALT: 12 U/L — ABNORMAL LOW (ref 17–63)
AST: 18 U/L (ref 15–41)
Albumin: 3.1 g/dL — ABNORMAL LOW (ref 3.5–5.0)
Alkaline Phosphatase: 140 U/L — ABNORMAL HIGH (ref 38–126)
Anion gap: 16 — ABNORMAL HIGH (ref 5–15)
BILIRUBIN TOTAL: 0.7 mg/dL (ref 0.3–1.2)
BUN: 54 mg/dL — AB (ref 6–20)
CHLORIDE: 100 mmol/L — AB (ref 101–111)
CO2: 19 mmol/L — ABNORMAL LOW (ref 22–32)
Calcium: 9.2 mg/dL (ref 8.9–10.3)
Creatinine, Ser: 4.07 mg/dL — ABNORMAL HIGH (ref 0.61–1.24)
GFR, EST AFRICAN AMERICAN: 19 mL/min — AB (ref >60)
GFR, EST NON AFRICAN AMERICAN: 16 mL/min — AB (ref >60)
Glucose, Bld: 124 mg/dL — ABNORMAL HIGH (ref 65–99)
POTASSIUM: 5.1 mmol/L (ref 3.5–5.1)
Sodium: 135 mmol/L (ref 135–145)
TOTAL PROTEIN: 7.8 g/dL (ref 6.5–8.1)

## 2015-11-26 LAB — I-STAT CHEM 8, ED
BUN: 55 mg/dL — ABNORMAL HIGH (ref 6–20)
CHLORIDE: 102 mmol/L (ref 101–111)
Calcium, Ion: 1.08 mmol/L — ABNORMAL LOW (ref 1.12–1.23)
Creatinine, Ser: 4.1 mg/dL — ABNORMAL HIGH (ref 0.61–1.24)
Glucose, Bld: 130 mg/dL — ABNORMAL HIGH (ref 65–99)
HEMATOCRIT: 36 % — AB (ref 39.0–52.0)
HEMOGLOBIN: 12.2 g/dL — AB (ref 13.0–17.0)
POTASSIUM: 4.9 mmol/L (ref 3.5–5.1)
SODIUM: 135 mmol/L (ref 135–145)
TCO2: 22 mmol/L (ref 0–100)

## 2015-11-26 LAB — URINALYSIS, ROUTINE W REFLEX MICROSCOPIC
Glucose, UA: NEGATIVE mg/dL
Ketones, ur: 15 mg/dL — AB
Nitrite: POSITIVE — AB
SPECIFIC GRAVITY, URINE: 1.018 (ref 1.005–1.030)
pH: 6.5 (ref 5.0–8.0)

## 2015-11-26 LAB — CBC WITH DIFFERENTIAL/PLATELET
BASOS ABS: 0 10*3/uL (ref 0.0–0.1)
Basophils Relative: 0 %
EOS ABS: 0 10*3/uL (ref 0.0–0.7)
EOS PCT: 0 %
HCT: 32.6 % — ABNORMAL LOW (ref 39.0–52.0)
HEMOGLOBIN: 10 g/dL — AB (ref 13.0–17.0)
LYMPHS ABS: 2.5 10*3/uL (ref 0.7–4.0)
LYMPHS PCT: 11 %
MCH: 27 pg (ref 26.0–34.0)
MCHC: 30.7 g/dL (ref 30.0–36.0)
MCV: 87.9 fL (ref 78.0–100.0)
Monocytes Absolute: 1.9 10*3/uL — ABNORMAL HIGH (ref 0.1–1.0)
Monocytes Relative: 8 %
NEUTROS PCT: 81 %
Neutro Abs: 18.5 10*3/uL — ABNORMAL HIGH (ref 1.7–7.7)
PLATELETS: 458 10*3/uL — AB (ref 150–400)
RBC: 3.71 MIL/uL — AB (ref 4.22–5.81)
RDW: 14.8 % (ref 11.5–15.5)
WBC: 23 10*3/uL — AB (ref 4.0–10.5)

## 2015-11-26 LAB — LACTIC ACID, PLASMA
LACTIC ACID, VENOUS: 2.4 mmol/L — AB (ref 0.5–2.0)
LACTIC ACID, VENOUS: 1 mmol/L (ref 0.5–2.0)
Lactic Acid, Venous: 1.2 mmol/L (ref 0.5–2.0)

## 2015-11-26 LAB — C DIFFICILE QUICK SCREEN W PCR REFLEX
C DIFFICILE (CDIFF) INTERP: NEGATIVE
C DIFFICILE (CDIFF) TOXIN: NEGATIVE
C DIFFICLE (CDIFF) ANTIGEN: NEGATIVE

## 2015-11-26 LAB — MAGNESIUM: MAGNESIUM: 1.9 mg/dL (ref 1.7–2.4)

## 2015-11-26 LAB — URINE MICROSCOPIC-ADD ON: Squamous Epithelial / LPF: NONE SEEN

## 2015-11-26 LAB — POC OCCULT BLOOD, ED: FECAL OCCULT BLD: POSITIVE — AB

## 2015-11-26 LAB — I-STAT CG4 LACTIC ACID, ED
LACTIC ACID, VENOUS: 2.17 mmol/L — AB (ref 0.5–2.0)
LACTIC ACID, VENOUS: 2.39 mmol/L — AB (ref 0.5–2.0)

## 2015-11-26 LAB — CBG MONITORING, ED: Glucose-Capillary: 124 mg/dL — ABNORMAL HIGH (ref 65–99)

## 2015-11-26 LAB — PHOSPHORUS: PHOSPHORUS: 4 mg/dL (ref 2.5–4.6)

## 2015-11-26 LAB — GLUCOSE, CAPILLARY: Glucose-Capillary: 141 mg/dL — ABNORMAL HIGH (ref 65–99)

## 2015-11-26 LAB — MRSA PCR SCREENING: MRSA by PCR: NEGATIVE

## 2015-11-26 LAB — PROCALCITONIN: PROCALCITONIN: 12.37 ng/mL

## 2015-11-26 MED ORDER — PIPERACILLIN-TAZOBACTAM 3.375 G IVPB
3.3750 g | Freq: Three times a day (TID) | INTRAVENOUS | Status: DC
  Administered 2015-11-26 – 2015-11-28 (×6): 3.375 g via INTRAVENOUS
  Filled 2015-11-26 (×8): qty 50

## 2015-11-26 MED ORDER — PIPERACILLIN-TAZOBACTAM IN DEX 2-0.25 GM/50ML IV SOLN
2.2500 g | Freq: Three times a day (TID) | INTRAVENOUS | Status: DC
  Administered 2015-11-26: 2.25 g via INTRAVENOUS
  Filled 2015-11-26 (×4): qty 50

## 2015-11-26 MED ORDER — NOREPINEPHRINE BITARTRATE 1 MG/ML IV SOLN
0.0000 ug/min | INTRAVENOUS | Status: DC
  Administered 2015-11-26: 4 ug/min via INTRAVENOUS
  Filled 2015-11-26: qty 4

## 2015-11-26 MED ORDER — VANCOMYCIN HCL IN DEXTROSE 1-5 GM/200ML-% IV SOLN
1000.0000 mg | INTRAVENOUS | Status: DC
  Filled 2015-11-26: qty 200

## 2015-11-26 MED ORDER — VANCOMYCIN HCL IN DEXTROSE 1-5 GM/200ML-% IV SOLN
1000.0000 mg | Freq: Once | INTRAVENOUS | Status: AC
  Administered 2015-11-26: 1000 mg via INTRAVENOUS
  Filled 2015-11-26: qty 200

## 2015-11-26 MED ORDER — SODIUM CHLORIDE 0.9 % IV SOLN
25.0000 ug/h | INTRAVENOUS | Status: DC
  Administered 2015-11-26: 50 ug/h via INTRAVENOUS
  Filled 2015-11-26: qty 50

## 2015-11-26 MED ORDER — PIPERACILLIN-TAZOBACTAM 3.375 G IVPB 30 MIN
3.3750 g | Freq: Once | INTRAVENOUS | Status: AC
  Administered 2015-11-26: 3.375 g via INTRAVENOUS
  Filled 2015-11-26: qty 50

## 2015-11-26 MED ORDER — PANTOPRAZOLE SODIUM 40 MG IV SOLR
40.0000 mg | Freq: Every day | INTRAVENOUS | Status: DC
  Administered 2015-11-26 – 2015-11-27 (×2): 40 mg via INTRAVENOUS
  Filled 2015-11-26 (×3): qty 40

## 2015-11-26 MED ORDER — SODIUM CHLORIDE 0.9 % IV SOLN: INTRAVENOUS | Status: DC | PRN

## 2015-11-26 MED ORDER — SODIUM CHLORIDE 0.9 % IV BOLUS (SEPSIS)
500.0000 mL | INTRAVENOUS | Status: AC
  Administered 2015-11-26: 500 mL via INTRAVENOUS

## 2015-11-26 MED ORDER — NALOXONE HCL 2 MG/2ML IJ SOSY
2.0000 mg | PREFILLED_SYRINGE | Freq: Once | INTRAMUSCULAR | Status: AC
  Administered 2015-11-26: 2 mg via INTRAVENOUS

## 2015-11-26 MED ORDER — NALOXONE HCL 2 MG/2ML IJ SOSY
PREFILLED_SYRINGE | INTRAMUSCULAR | Status: AC
  Filled 2015-11-26: qty 2

## 2015-11-26 MED ORDER — SUCCINYLCHOLINE CHLORIDE 20 MG/ML IJ SOLN
100.0000 mg | Freq: Once | INTRAMUSCULAR | Status: AC
  Administered 2015-11-26: 100 mg via INTRAVENOUS
  Filled 2015-11-26: qty 5

## 2015-11-26 MED ORDER — SODIUM CHLORIDE 0.9 % IV SOLN
INTRAVENOUS | Status: DC
  Administered 2015-11-26 – 2015-11-29 (×6): via INTRAVENOUS

## 2015-11-26 MED ORDER — SODIUM CHLORIDE 0.9 % IV BOLUS (SEPSIS)
1000.0000 mL | INTRAVENOUS | Status: AC
  Administered 2015-11-26 (×2): 1000 mL via INTRAVENOUS

## 2015-11-26 MED ORDER — SODIUM CHLORIDE 0.9 % IV SOLN
10.0000 ug/h | INTRAVENOUS | Status: DC
  Administered 2015-11-26: 10 ug/h via INTRAVENOUS
  Filled 2015-11-26: qty 50

## 2015-11-26 MED ORDER — SODIUM CHLORIDE 0.9 % IV BOLUS (SEPSIS)
1000.0000 mL | Freq: Once | INTRAVENOUS | Status: AC
  Administered 2015-11-26: 1000 mL via INTRAVENOUS

## 2015-11-26 MED ORDER — FENTANYL CITRATE (PF) 100 MCG/2ML IJ SOLN: 100.0000 ug | INTRAMUSCULAR | Status: DC | PRN

## 2015-11-26 MED ORDER — ACETAMINOPHEN 650 MG RE SUPP
650.0000 mg | Freq: Once | RECTAL | Status: AC
  Administered 2015-11-26: 650 mg via RECTAL
  Filled 2015-11-26: qty 1

## 2015-11-26 MED ORDER — SODIUM CHLORIDE 0.9 % IV SOLN
1000.0000 mL | INTRAVENOUS | Status: DC
  Administered 2015-11-26: 1000 mL via INTRAVENOUS

## 2015-11-26 MED ORDER — MIDAZOLAM HCL 2 MG/2ML IJ SOLN: 2.0000 mg | INTRAMUSCULAR | Status: DC | PRN

## 2015-11-26 MED ORDER — NALOXONE HCL 0.4 MG/ML IJ SOLN
INTRAMUSCULAR | Status: AC
  Filled 2015-11-26: qty 1

## 2015-11-26 MED ORDER — ETOMIDATE 2 MG/ML IV SOLN
20.0000 mg | Freq: Once | INTRAVENOUS | Status: AC
  Administered 2015-11-26: 20 mg via INTRAVENOUS

## 2015-11-26 MED ORDER — ANTISEPTIC ORAL RINSE SOLUTION (CORINZ)
7.0000 mL | Freq: Four times a day (QID) | OROMUCOSAL | Status: DC
  Administered 2015-11-27 – 2015-11-28 (×8): 7 mL via OROMUCOSAL

## 2015-11-26 MED ORDER — CHLORHEXIDINE GLUCONATE 0.12% ORAL RINSE (MEDLINE KIT)
15.0000 mL | Freq: Two times a day (BID) | OROMUCOSAL | Status: DC
  Administered 2015-11-26 – 2015-11-29 (×6): 15 mL via OROMUCOSAL

## 2015-11-26 MED ORDER — MIDAZOLAM HCL 2 MG/2ML IJ SOLN
4.0000 mg | Freq: Once | INTRAMUSCULAR | Status: AC
  Administered 2015-11-26: 4 mg via INTRAVENOUS
  Filled 2015-11-26: qty 4

## 2015-11-26 MED ORDER — SODIUM CHLORIDE 0.9 % IV SOLN
2.0000 mg/h | INTRAVENOUS | Status: DC
  Administered 2015-11-26 (×2): 2 mg/h via INTRAVENOUS
  Filled 2015-11-26 (×3): qty 10

## 2015-11-26 MED ORDER — KETOROLAC TROMETHAMINE 30 MG/ML IJ SOLN
30.0000 mg | Freq: Once | INTRAMUSCULAR | Status: AC
  Administered 2015-11-26: 30 mg via INTRAVENOUS
  Filled 2015-11-26: qty 1

## 2015-11-26 MED ORDER — ACETAMINOPHEN 325 MG PO TABS
650.0000 mg | ORAL_TABLET | ORAL | Status: DC | PRN
  Administered 2015-11-27 – 2015-12-01 (×7): 650 mg via ORAL
  Filled 2015-11-26 (×7): qty 2

## 2015-11-26 MED ORDER — ONDANSETRON HCL 4 MG/2ML IJ SOLN
4.0000 mg | Freq: Once | INTRAMUSCULAR | Status: AC
  Administered 2015-11-26: 4 mg via INTRAVENOUS
  Filled 2015-11-26: qty 2

## 2015-11-26 MED ORDER — FENTANYL CITRATE (PF) 100 MCG/2ML IJ SOLN
50.0000 ug | Freq: Once | INTRAMUSCULAR | Status: AC
  Administered 2015-11-26: 50 ug via INTRAVENOUS
  Filled 2015-11-26: qty 2
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
## 2015-11-26 NOTE — ED Notes (Signed)
Clydene Fake RN, Sharyn Lull RN, Dr. Leonides Schanz, Kal Makahlaus RT at bedside

## 2015-11-26 NOTE — ED Notes (Signed)
Turned for rectal temp and cleaning of stool; large pink sacral dressing clean, dry, intact. Wound not visualized due to dressing.

## 2015-11-26 NOTE — ED Notes (Signed)
Pharmacy to send medication. 

## 2015-11-26 NOTE — ED Notes (Addendum)
7.5 tube, 21 at teeth. Color change noted.

## 2015-11-26 NOTE — ED Notes (Signed)
Pt. Is from [place], EMS was called for abdominal distention. Pt has chronic foley catheter use, has a wound vac to the right foot. Pt old foley was blocked and the nurse at [facility] took the foley out and replaced it. When the new foley was placed about 1000cc of purulent bloody urine came out. Pt is lethargic and not responding to stimuli

## 2015-11-26 NOTE — ED Notes (Signed)
MD at bedside. 

## 2015-11-26 NOTE — Progress Notes (Signed)
Patient intubated by ED physician with a 7.5 ETT taped at 22 at lip, good color change on ETCO2 detector, equal BBS, SATS remained at 100%, placed on above vent settings MD aware.

## 2015-11-26 NOTE — ED Notes (Signed)
Pts mouth suctioned. Pt acting like he was going to vomit, made no attempts to life head while gagging.

## 2015-11-26 NOTE — Procedures (Signed)
Central Venous Catheter Insertion Procedure Note KESHAV SHOWMAN SN:[id number] [birth year]  Procedure: Insertion of Central Venous Catheter Indications: Assessment of intravascular volume and Drug and/or fluid administration  Procedure Details Consent: Risks of procedure as well as the alternatives and risks of each were explained to the (patient/caregiver).  Consent for procedure obtained. Time Out: Verified patient identification, verified procedure, site/side was marked, verified correct patient position, special equipment/implants available, medications/allergies/relevent history reviewed, required imaging and test results available.  Performed  Maximum sterile technique was used including antiseptics, cap, gloves, gown, hand hygiene, mask and sheet. Skin prep: Chlorhexidine; local anesthetic administered A antimicrobial bonded/coated triple lumen catheter was placed in the left internal jugular vein using the Seldinger technique.  Evaluation Blood flow good Complications: No apparent complications Patient did tolerate procedure well. Chest X-ray ordered to verify placement.  CXR: pending.   Performed using ultrasound guidance.  Wire visualized in vessel under ultrasound.   Nickolas Madrid, NP 11/26/2015  4:27 PM   Baltazar Apo, MD, PhD 11/27/2015, 10:37 AM [facility] [phone] or if no answer [phone]

## 2015-11-26 NOTE — ED Notes (Addendum)
Blood cultures completed at this time, cultures just click off at a later time.
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
## 2015-11-26 NOTE — H&P (Signed)
PULMONARY / CRITICAL CARE MEDICINE   Name: Phillip [name] MRN: [id number] DOB: [birth year]    ADMISSION DATE:  11/26/2015  REFERRING MD:  EDP  CHIEF COMPLAINT:  AMS, sepsis   HISTORY OF PRESENT ILLNESS:   49yo male smoker, SNF resident with hx quadriplegia r/t fairly recent cervical spine injury (05/2015), ongoing issues with R ankle wound with VAC post ORIF in setting fx presented 12/17 with AMS.  Over past several days has c/o abd distension and urinary retention despite indwelling foley catheter.  On 11/1743 catheter was replaced with >1L purulent urine out.  In ER pt was hypotensive with progressive lethargy and was intubated for airway protection.   Labs notable for WBC 23, Scr 4.1, lactate 2.17.  PCCM called for ICU admission.   Pt's cousin who sees him at SNF every weekend says that he is normally awake, alert, appropriate, feeds himself.    PAST MEDICAL HISTORY :  He  has a past medical history of DJD (degenerative joint disease); Seizures ([place]); Broken hip ([place]); Cervical spinal cord injury ([place]); and Tobacco abuse.  PAST SURGICAL HISTORY: He  has past surgical history that includes Knee surgery; Intramedullary (im) nail intertrochanteric (Right, 10/09/2014); ORIF ankle fracture (Right, 05/26/2015); Posterior cervical fusion/foraminotomy (N/A, 05/30/2015); Incision and drainage of wound (N/A, 08/24/2015); Application of a-cell of chest/abdomen (N/A, 08/24/2015); I&D extremity (Right, 11/11/2015); Hardware Removal (Right, [id number]); and Application if wound vac (Right, 11/11/2015).  No Known Allergies  No current facility-administered medications on file prior to encounter.   Current Outpatient Prescriptions on File Prior to Encounter  Medication Sig  . acetaminophen (TYLENOL) 325 MG tablet Take 650 mg by mouth 3 (three) times daily.  . Amino Acids-Protein Hydrolys (FEEDING SUPPLEMENT, PRO-STAT SUGAR FREE 64,) LIQD Take 30 mLs by mouth 2 (two) times daily.  Marland Kitchen amitriptyline (ELAVIL)  25 MG tablet Take 25 mg by mouth at bedtime.  Marland Kitchen amLODipine (NORVASC) 10 MG tablet Take 1 tablet (10 mg total) by mouth daily.  . baclofen (LIORESAL) 10 MG tablet Take 0.5 tablets (5 mg total) by mouth 3 (three) times daily.  . bisacodyl (DULCOLAX) 10 MG suppository Place 10 mg rectally daily as needed for moderate constipation.  . diclofenac sodium (VOLTAREN) 1 % GEL Apply 2 g topically 3 (three) times daily.  Marland Kitchen enoxaparin (LOVENOX) 40 MG/0.4ML injection Inject 0.4 mLs (40 mg total) into the skin daily.  . ferrous sulfate 325 (65 FE) MG tablet Take 1 tablet (325 mg total) by mouth 2 (two) times daily with a meal.  . gabapentin (NEURONTIN) 300 MG capsule Take 300 mg by mouth 3 (three) times daily.  Marland Kitchen HYDROcodone-acetaminophen (NORCO) 7.5-325 MG tablet Take 1 tablet by mouth every 4 (four) hours as needed for moderate pain.  . metoprolol tartrate (LOPRESSOR) 25 MG tablet Take 0.5 tablets (12.5 mg total) by mouth 2 (two) times daily.  . Multiple Vitamin (MULTIVITAMIN WITH MINERALS) TABS tablet Take 1 tablet by mouth daily.  . ondansetron (ZOFRAN) 4 MG tablet Take 1 tablet (4 mg total) by mouth every 6 (six) hours as needed for nausea.  . polyethylene glycol (MIRALAX / GLYCOLAX) packet Take 17 g by mouth daily.  Marland Kitchen saccharomyces boulardii (FLORASTOR) 250 MG capsule Take 1 capsule (250 mg total) by mouth 2 (two) times daily.  Marland Kitchen tiZANidine (ZANAFLEX) 2 MG tablet Take 2 mg by mouth every 8 (eight) hours as needed for muscle spasms.  . vitamin B-12 1000 MCG tablet Take 1 tablet (1,000 mcg total) by mouth daily.  Marland Kitchen  vitamin C (VITAMIN C) 500 MG tablet Take 1 tablet (500 mg total) by mouth 2 (two) times daily.  Marland Kitchen zinc sulfate 220 MG capsule Take 1 capsule (220 mg total) by mouth daily.  Marland Kitchen zolpidem (AMBIEN) 5 MG tablet Take 1 tablet (5 mg total) by mouth at bedtime as needed for sleep.  . feeding supplement, ENSURE ENLIVE, (ENSURE ENLIVE) LIQD Take 237 mLs by mouth 2 (two) times daily between meals. (Patient  not taking: Reported on 11/26/2015)    FAMILY HISTORY:  His has no family status information on file.   SOCIAL HISTORY: He  reports that he has been smoking Cigarettes.  He has a .1 pack-year smoking history. He does not have any smokeless tobacco history on file. He reports that he does not drink alcohol or use illicit drugs.  REVIEW OF SYSTEMS:   Unable, pt sedated on vent.   SUBJECTIVE:  Unable to answer questions.   VITAL SIGNS: BP 111/73 mmHg  Pulse 91  Temp(Src) 97 F (36.1 C) (Rectal)  Resp 20  Ht 5\' 10"  (1.778 m)  Wt 155 lb (70.308 kg)  BMI 22.24 kg/m2  SpO2 100%  HEMODYNAMICS:    VENTILATOR SETTINGS:    INTAKE / OUTPUT:    PHYSICAL EXAMINATION: General:  Chronically ill appearing male, sedated on vent  Neuro:  RASS -2, opens eyes, groans to pain HEENT:  Mm dry, ETT Cardiovascular:  s1s2 rrr Lungs:  resps even non labored on vent, coarse  Abdomen:  Round, soft, +bs, foley with purulent urine  Musculoskeletal:  R ankle wound with VAC, L great toe dark, scant BLE edema  LABS:  BMET  Recent Labs Lab 11/26/15 0600 11/26/15 0631  NA 135 135  K 5.1 4.9  CL 100* 102  CO2 19*  --   BUN 54* 55*  CREATININE 4.07* 4.10*  GLUCOSE 124* 130*    Electrolytes  Recent Labs Lab 11/26/15 0600  CALCIUM 9.2    CBC  Recent Labs Lab 11/26/15 0600 11/26/15 0631  WBC 23.0*  --   HGB 10.0* 12.2*  HCT 32.6* 36.0*  PLT 458*  --     Coag's No results for input(s): APTT, INR in the last 168 hours.  Sepsis Markers  Recent Labs Lab 11/26/15 0600 11/26/15 0611  LATICACIDVEN  --  2.17*  PROCALCITON 12.37  --     ABG  Recent Labs Lab 11/26/15 0851  PHART 7.489*  PCO2ART 25.2*  PO2ART 147.0*    Liver Enzymes  Recent Labs Lab 11/26/15 0600  AST 18  ALT 12*  ALKPHOS 140*  BILITOT 0.7  ALBUMIN 3.1*    Cardiac Enzymes No results for input(s): TROPONINI, PROBNP in the last 168 hours.  Glucose  Recent Labs Lab 11/26/15 0524   GLUCAP 124*    Imaging Dg Abd 1 View  11/26/2015  CLINICAL DATA:  Patient status post enteric tube placement. EXAM: ABDOMEN - 1 VIEW COMPARISON:  CT abdomen pelvis 06/11/2015; abdominal radiograph 05/24/2015. FINDINGS: Enteric tube tip and side-port project over the left upper quadrant, the side port may be within the distal esophagus. Gas is demonstrated within nondilated loops of large and small bowel. There is a marked amount of stool throughout the visualized colon. IMPRESSION: Recommend slight advancement of the enteric tube as its side port may be within the distal esophagus. Marked amount of stool throughout the colon as can be seen with constipation. Electronically Signed   By: Lovey Newcomer M.D.   On: 11/26/2015 08:40  Ct Head Wo Contrast  11/26/2015  CLINICAL DATA:  Patient with history of quadriplegia since from nursing home due to abdominal distension. EXAM: CT HEAD WITHOUT CONTRAST TECHNIQUE: Contiguous axial images were obtained from the base of the skull through the vertex without intravenous contrast. COMPARISON:  Brain CT 06/07/2015. FINDINGS: Ventricles and sulci are appropriate for patient's age. No evidence for acute cortically based infarct, intracranial hemorrhage, mass lesion or mass-effect. Mucosal thickening within the ethmoid air cells. Mastoid air cells are unremarkable. Calvarium intact. IMPRESSION: No acute intracranial process. Electronically Signed   By: Lovey Newcomer M.D.   On: 11/26/2015 08:47   Dg Chest Port 1 View  11/26/2015  CLINICAL DATA:  Check endotracheal tube placement EXAM: PORTABLE CHEST - 1 VIEW COMPARISON:  11/26/2015 FINDINGS: Endotracheal tube and nasogastric catheter are now seen in satisfactory position. The lungs are well aerated bilaterally. No focal infiltrate or sizable effusion is seen. Postsurgical changes in the cervical spine are noted. IMPRESSION: Tubes and lines as described.  No acute abnormality noted. Electronically Signed   By: Inez Catalina  M.D.   On: 11/26/2015 08:41   Dg Chest Port 1 View  11/26/2015  CLINICAL DATA:  Fever and abdominal distention. Chronic Foley catheter use. Wound VAC to the right foot. Old Foley catheter was block to and was subsequently replaced. With new Foley placement purulent bloody urine was obtained. Patient is lethargic and not responsive. EXAM: PORTABLE CHEST 1 VIEW COMPARISON:  11/10/2015 FINDINGS: Normal heart size and pulmonary vascularity. No focal airspace disease or consolidation in the lungs. No blunting of costophrenic angles. No pneumothorax. Mediastinal contours appear intact. Postoperative changes in the lower cervical spine. IMPRESSION: No active disease. Electronically Signed   By: Lucienne Capers M.D.   On: 11/26/2015 06:31     STUDIES:  CT head 12/17>>> Renal u/s 12/17>>>  CULTURES: Urine 12/17>>> BC x 2 12/17>>>  ANTIBIOTICS: Vanc 12/17>>> Zosyn 12/17>>>  SIGNIFICANT EVENTS:  LINES/TUBES: ETT 12/17>>>  DISCUSSION:  ASSESSMENT / PLAN:  PULMONARY Acute respiratory failure r/t AMS  P:   Vent support - 8cc/kg  F/u CXR  F/u ABG   CARDIOVASCULAR Sepsis - r/t UTI +/- R ankle wound  Hx HTN  P:  Continue volume resuscitation  Trend lactate, pct  abx as below  Hold home norvasc, lopressor  RENAL AKI Obstructive uropathy - r/t clogged foley catheter UTI P:   Volume as above  Bladder decompression  Renal u/s now  F/u chem  Will consult renal 12/18 if S Cr and UOP not improving  GASTROINTESTINAL FOBT POS - no obvious s/s bleeding, not grossly anemic  P:   PPI  Consider GI workup once more stable  Trend CBC    HEMATOLOGIC Anemia - mild  P:  F/u CBC  SCD's   INFECTIOUS UTI  Sepsis  ?R ankle wound infection  P:   Broad spectrum abx as above pending cx data Pan culture  Wound nurse to remove wound VAC and assess - may need ortho to assist (Just had I&D 12/3 by Dr. Marlou Sa)  ENDOCRINE Hyperglycemia - no known hx DM   P:   Follow glucose on chem,  start SSI if consistently >180  NEUROLOGIC AMS - likely r/t hypotension, sepsis  Paraplegia  P:   RASS goal: -1 PRN fentanyl  Daily WUA  Hold home neurontin, amitriptyline, baclofen   FAMILY  - Updates:  Cousin updated 12/17 in ER   Nickolas Madrid, NP 11/26/2015  8:55 AM Pager: [phone] or [phone]  Attending Note:  I have examined patient, reviewed labs, studies and notes. I have discussed the case with Shon Millet, and I agree with the data and plans as amended above. Paraplegic patient, chronic foley and R ankle wound. Admitetd with toxic metabolic encephalopathy in setting severe sepsis. Foley replaced and purulent UTI noted. Acute renal failure secondary to obstruction. Will treat w broad abx, follow renal fxn, ventilate for now with plan to initiate SBT next 24h.  Independent critical care time is 50 minutes.   Baltazar Apo, MD, PhD 11/26/2015, 10:53 AM  Pulmonary and Critical Care [phone] or if no answer [phone]
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
## 2015-11-26 NOTE — Procedures (Signed)
Arterial Catheter Insertion Procedure Note Phillip [name] [place]:[id number] Apr 18, 1966  Procedure: Insertion of Arterial Catheter  Indications: Blood pressure monitoring  Procedure Details Consent: Unable to obtain consent because of altered level of consciousness. Time Out: Verified patient identification, verified procedure, site/side was marked, verified correct patient position, special equipment/implants available, medications/allergies/relevent history reviewed, required imaging and test results available.  Performed  Maximum sterile technique was used including cap, gloves, gown, hand hygiene and mask. Skin prep: Chlorhexidine; local anesthetic administered 20 gauge catheter was inserted into right radial artery using the Seldinger technique.  Evaluation Blood flow good; BP tracing good. Complications: No apparent complications.  Pt RR cleaned in sterile fashion and draped with sterile towel. Arterial catheter placed in RR with good blood flow and pulsing in line. Good waveform on monitor with coordinating BP. RT will continue to monitor.    Jesse Sans 11/26/2015

## 2015-11-27 ENCOUNTER — Inpatient Hospital Stay (HOSPITAL_COMMUNITY): Payer: Medicaid Other

## 2015-11-27 DIAGNOSIS — J9601 Acute respiratory failure with hypoxia: Secondary | ICD-10-CM

## 2015-11-27 LAB — CBC
HCT: 21.9 % — ABNORMAL LOW (ref 39.0–52.0)
Hemoglobin: 7.1 g/dL — ABNORMAL LOW (ref 13.0–17.0)
MCH: 27.5 pg (ref 26.0–34.0)
MCHC: 32.4 g/dL (ref 30.0–36.0)
MCV: 84.9 fL (ref 78.0–100.0)
PLATELETS: 300 10*3/uL (ref 150–400)
RBC: 2.58 MIL/uL — ABNORMAL LOW (ref 4.22–5.81)
RDW: 14.5 % (ref 11.5–15.5)
WBC: 12.7 10*3/uL — ABNORMAL HIGH (ref 4.0–10.5)

## 2015-11-27 LAB — BASIC METABOLIC PANEL
Anion gap: 9 (ref 5–15)
BUN: 27 mg/dL — AB (ref 6–20)
CALCIUM: 8.6 mg/dL — AB (ref 8.9–10.3)
CHLORIDE: 111 mmol/L (ref 101–111)
CO2: 20 mmol/L — ABNORMAL LOW (ref 22–32)
CREATININE: 0.98 mg/dL (ref 0.61–1.24)
GFR calc Af Amer: >60 mL/min (ref >60)
Glucose, Bld: 101 mg/dL — ABNORMAL HIGH (ref 65–99)
Potassium: 3 mmol/L — ABNORMAL LOW (ref 3.5–5.1)
SODIUM: 140 mmol/L (ref 135–145)

## 2015-11-27 LAB — MAGNESIUM: MAGNESIUM: 1.9 mg/dL (ref 1.7–2.4)

## 2015-11-27 LAB — VANCOMYCIN, RANDOM

## 2015-11-27 LAB — PHOSPHORUS: Phosphorus: 2 mg/dL — ABNORMAL LOW (ref 2.5–4.6)

## 2015-11-27 MED ORDER — VANCOMYCIN HCL IN DEXTROSE 750-5 MG/150ML-% IV SOLN
750.0000 mg | Freq: Two times a day (BID) | INTRAVENOUS | Status: DC
  Administered 2015-11-27 – 2015-11-28 (×2): 750 mg via INTRAVENOUS
  Filled 2015-11-27 (×3): qty 150

## 2015-11-27 MED ORDER — AMLODIPINE BESYLATE 10 MG PO TABS
10.0000 mg | ORAL_TABLET | Freq: Every day | ORAL | Status: DC
  Administered 2015-11-27 – 2015-11-29 (×3): 10 mg via ORAL
  Filled 2015-11-27 (×4): qty 1

## 2015-11-27 MED ORDER — POTASSIUM CHLORIDE 10 MEQ/50ML IV SOLN
10.0000 meq | INTRAVENOUS | Status: AC
  Administered 2015-11-27 (×4): 10 meq via INTRAVENOUS
  Filled 2015-11-27 (×3): qty 50

## 2015-11-27 MED ORDER — METOPROLOL TARTRATE 12.5 MG HALF TABLET
12.5000 mg | ORAL_TABLET | Freq: Two times a day (BID) | ORAL | Status: DC
  Administered 2015-11-27 – 2015-12-02 (×11): 12.5 mg via ORAL
  Filled 2015-11-27 (×13): qty 1

## 2015-11-27 MED ORDER — SODIUM PHOSPHATE 3 MMOLE/ML IV SOLN
10.0000 mmol | Freq: Once | INTRAVENOUS | Status: AC
  Administered 2015-11-27: 10 mmol via INTRAVENOUS
  Filled 2015-11-27: qty 3.33

## 2015-11-27 NOTE — Progress Notes (Signed)
Pharmacy Antibiotic Follow-up Note  Phillip [name] is a 48 y.o. year-old quadriplegic male admitted on 11/26/2015.  The patient is currently on day 2 of vancomycin and Zosyn for sepsis likely from R ankle ulcer.  Assessment/Plan: Renal function has improved and SCr back to normal although hard to determine true renal function with quadriplegia. Vancomycin level is <4.   Begin vancomycin 750 mg IV q12h Monitor renal function and UOP Check trough at steady-state  Temp (24hrs), Avg:100.9 F (38.3 C), Min:98.3 F (36.8 C), Max:102.7 F (39.3 C)   Recent Labs Lab 11/26/15 0600 11/27/15 0420  WBC 23.0* 12.7*    Recent Labs Lab 11/26/15 0600 11/26/15 0631 11/26/15 1105 11/26/15 1822 11/27/15 0420  CREATININE 4.07* 4.10* 2.45* 1.54* 0.98   Estimated Creatinine Clearance: 89.9 mL/min (by C-G formula based on Cr of 0.98).    No Known Allergies  Antimicrobials this admission: Vanc 12/1 >> 12/6, 12/17>>  Zosyn 12/1 >> 12/6, 12/17>>   Levels/dose changes this admission: none  Microbiology results: 12/17 CDiff: antigen and toxin negative  12/17 urine: ngtd  12/17 BCx: ngtd  12/17 Cdiff: neg   12/1 L ankle wound > MRSA (S-vanc, clinda, rifampin, tetra, gent)  12/1 blood x 2 > 1/2 CoNS (S-gent,rifampin, tetra, vanc)  12/1 urine > Ecoli (pan-S) + pseudomonas (30k col, likely colonization with catheter - S-cipro)  12/1 MRSA screen neg  12/2 R ankle wound >>  12/3 blood x 2>>ngtd   Thank you for allowing pharmacy to be a part of this patient's care.  [place], Pharm.D., BCPS Clinical Pharmacist Pager: [phone] 11/27/2015 5:42 PM

## 2015-11-27 NOTE — Progress Notes (Signed)
PULMONARY / CRITICAL CARE MEDICINE   Name: Phillip [name] MRN: [id number] DOB: 09-09-1966    ADMISSION DATE:  11/26/2015  REFERRING MD:  EDP  CHIEF COMPLAINT:  AMS, sepsis   HISTORY OF PRESENT ILLNESS:   49yo male smoker, SNF resident with hx quadriplegia r/t fairly recent cervical spine injury (05/2015), ongoing issues with R ankle wound with VAC post ORIF in setting fx presented 12/17 with AMS.  Over past several days has c/o abd distension and urinary retention despite indwelling foley catheter.  On 11/1743 catheter was replaced with >1L purulent urine out.  In ER pt was hypotensive with progressive lethargy and was intubated for airway protection.   Labs notable for WBC 23, Scr 4.1, lactate 2.17.  PCCM called for ICU admission.   Pt's cousin who sees him at SNF every weekend says that he is normally awake, alert, appropriate, feeds himself.    SUBJECTIVE:  More awake Tolerating PSV 5 this am   VITAL SIGNS: BP 165/77 mmHg  Pulse 139  Temp(Src) 102.7 F (39.3 C) (Oral)  Resp 18  Ht 5\' 10"  (1.778 m)  Wt 68.2 kg (150 lb 5.7 oz)  BMI 21.57 kg/m2  SpO2 99%  HEMODYNAMICS: CVP:  [6 mmHg-10 mmHg] 10 mmHg  VENTILATOR SETTINGS: Vent Mode:  [-] PRVC FiO2 (%):  [40 %] 40 % Set Rate:  [18 bmp] 18 bmp Vt Set:  [580 mL] 580 mL PEEP:  [5 cmH20] 5 cmH20 Plateau Pressure:  [15 cmH20-17 cmH20] 17 cmH20  INTAKE / OUTPUT: I/O last 3 completed shifts: In: 1901.4 [I.V.:1718.9; NG/GT:70; IV Piggyback:112.5] Out: [id number] [Urine:3130; Emesis/NG output:100]  PHYSICAL EXAMINATION: General:  Chronically ill appearing male, awake, coughing Neuro:  RASS -0 to +1, opens eyes, following commands. Able to shrug shoulders HEENT:  Mm dry, ETT Cardiovascular:  s1s2 rrr Lungs:  resps even non labored on vent, coarse  Abdomen:  Round, soft, +bs, foley with urine more clear Musculoskeletal:  R ankle wound with VAC, L great toe dark, scant BLE edema  LABS:  BMET  Recent Labs Lab 11/26/15 1105  11/26/15 1822 11/27/15 0420  NA 138 138 140  K 3.6 3.1* 3.0*  CL 109 111 111  CO2 18* 19* 20*  BUN 44* 40* 27*  CREATININE 2.45* 1.54* 0.98  GLUCOSE 133* 112* 101*    Electrolytes  Recent Labs Lab 11/26/15 1105 11/26/15 1822 11/27/15 0420  CALCIUM 7.9* 8.4* 8.6*  MG 1.9  --  1.9  PHOS 4.0  --  2.0*    CBC  Recent Labs Lab 11/26/15 0600 11/26/15 0631 11/27/15 0420  WBC 23.0*  --  12.7*  HGB 10.0* 12.2* 7.1*  HCT 32.6* 36.0* 21.9*  PLT 458*  --  300    Coag's No results for input(s): APTT, INR in the last 168 hours.  Sepsis Markers  Recent Labs Lab 11/26/15 0600  11/26/15 1105 11/26/15 1822 11/26/15 2107  LATICACIDVEN  --   < > 2.4* 1.0 1.2  PROCALCITON 12.37  --   --   --   --   < > = values in this interval not displayed.  ABG  Recent Labs Lab 11/26/15 0851  PHART 7.489*  PCO2ART 25.2*  PO2ART 147.0*    Liver Enzymes  Recent Labs Lab 11/26/15 0600  AST 18  ALT 12*  ALKPHOS 140*  BILITOT 0.7  ALBUMIN 3.1*    Cardiac Enzymes No results for input(s): TROPONINI, PROBNP in the last 168 hours.  Glucose  Recent Labs Lab 11/26/15  [id number] 11/26/15 1011  GLUCAP 124* 141*    Imaging US Renal Port  11/26/2015  CLINICAL DATA:  49 year old male with UTI and sepsis. EXAM: RENAL / URINARY TRACT ULTRASOUND COMPLETE COMPARISON:  06/11/2015 CT. FINDINGS: Right Kidney: Length: 11.8 cm. Echogenicity within normal limits. Mild fullness of the intrarenal collecting system noted, but unchanged from prior CT. There is no evidence of solid mass or hydronephrosis. Left Kidney: Length: 12.8 cm. Echogenicity within normal limits. No mass or hydronephrosis visualized. Bladder: Foley catheter is noted within a collapsed bladder. IMPRESSION: No evidence of acute abnormality or hydronephrosis. Mild right intrarenal collecting system fullness -unchanged 06/11/2015. Unremarkable kidneys otherwise. Foley catheter within the bladder. Electronically Signed   By:  Margarette Canada M.D.   On: 11/26/2015 10:58   Dg Chest Port 1 View  11/27/2015  CLINICAL DATA:  49 year old male with respiratory failure. EXAM: PORTABLE CHEST 1 VIEW COMPARISON:  11/26/2015 and prior exams FINDINGS: An endotracheal tube with tip 3.2 cm above the carina, left IJ central venous catheter with tip overlying the upper SVC and NG tube entering the stomach again noted. There is no evidence of focal airspace disease, pulmonary edema, suspicious pulmonary nodule/mass, pleural effusion, or pneumothorax. No acute bony abnormalities are identified. Portion of the right lateral chest is off the field of view. IMPRESSION: Unchanged appearance of the chest with support apparatus as described. Electronically Signed   By: Margarette Canada M.D.   On: 11/27/2015 09:00   Dg Chest Port 1 View  11/26/2015  CLINICAL DATA:  49 year old male with a history of central line placement EXAM: PORTABLE CHEST 1 VIEW COMPARISON:  11/26/2015 FINDINGS: Cardiomediastinal silhouette unchanged. No evidence of confluent airspace disease. No visualized pneumothorax, with attention to the left. Interval placement of left IJ central venous catheter, with the tip appearing to terminate in the superior vena cava. Unchanged position of endotracheal tube, terminating suitably above the carina. Unchanged gastric tube, terminating in the left upper quadrant. Overlying EKG leads. Incompletely visualized surgical changes of the cervical region. IMPRESSION: Interval placement of left IJ central venous catheter, with the tip appearing to terminate in the superior vena cava. No visualized pneumothorax. Unchanged additional support apparatus, as above. Signed, Dulcy Fanny. Earleen Newport, DO [facility] Electronically Signed   By: Corrie Mckusick D.O.   On: 11/26/2015 17:32     STUDIES:  CT head 12/17>>> no acute process Renal u/s 12/17>>> no evidence for hydronephrosis  CULTURES: Urine 12/17>>> BC  x 2 12/17>>>  ANTIBIOTICS: Vanc 12/17>>> Zosyn 12/17>>>  SIGNIFICANT EVENTS:  LINES/TUBES: ETT 12/17>>> 12/18  DISCUSSION:  ASSESSMENT / PLAN:  PULMONARY Acute respiratory failure r/t AMS  P:   Plan extubate this am  Push pulm hygiene   CARDIOVASCULAR Sepsis - r/t UTI +/- R ankle wound  Hx HTN  P:  Decrease IVF to 50cc/h abx as below  Hold home norvasc, lopressor  RENAL AKI, resolved Obstructive uropathy - r/t clogged foley catheter UTI P:   Volume as above  Renal u/s reassuring, obstruction was at the level of the foley catheter F/u chem   GASTROINTESTINAL FOBT POS - no obvious s/s bleeding, not grossly anemic  P:   PPI  Consider GI workup once more stable  Trend CBC    HEMATOLOGIC Anemia - mild  P:  F/u CBC  SCD's   INFECTIOUS UTI  Sepsis  ?R ankle wound infection  P:   Broad spectrum abx as above pending cx data Wound nurse to remove  wound VAC and assess - may need ortho to assist (Just had I&D 12/3 by Dr. Marlou Sa)  ENDOCRINE Hyperglycemia - no known hx DM   P:   Follow glucose on chem, start SSI if consistently >180  NEUROLOGIC AMS - likely r/t hypotension, sepsis  Paraplegia  P:   RASS goal: 0 Hold home neurontin, amitriptyline, baclofen for now. Restart as he stabilizes   FAMILY  - Updates:  Cousin updated 12/17 in ER   Independent critical care time is 40 minutes.   Baltazar Apo, MD, PhD 11/27/2015, 10:38 AM [facility] [phone] or if no answer [phone]

## 2015-11-27 NOTE — Procedures (Signed)
Extubation Procedure Note  Patient Details:   Name: Phillip [name] DOB: [birth year] MRN: [id number]   Airway Documentation:     Evaluation  O2 sats: stable throughout Complications: No apparent complications Patient did tolerate procedure well. Bilateral Breath Sounds: Diminished, Rhonchi Suctioning: Oral, Airway No   Pt extubated per verbal Order, Dr Lamonte Sakai, at bedside after pt was pulling on ett. Pt extubated to 3 L [place] with no complications. Pt not following commands at this time when asked what his name is or to cough. Pt with clear/dim Bilateral BS. RT will continue to monitor.   Jesse Sans 11/27/2015, 10:32 AM

## 2015-11-28 DIAGNOSIS — S14109A Unspecified injury at unspecified level of cervical spinal cord, initial encounter: Secondary | ICD-10-CM

## 2015-11-28 DIAGNOSIS — Z01818 Encounter for other preprocedural examination: Secondary | ICD-10-CM

## 2015-11-28 DIAGNOSIS — R40243 Glasgow coma scale score 3-8, unspecified time: Secondary | ICD-10-CM

## 2015-11-28 LAB — CBC
HCT: 22.7 % — ABNORMAL LOW (ref 39.0–52.0)
Hemoglobin: 7 g/dL — ABNORMAL LOW (ref 13.0–17.0)
MCH: 26.5 pg (ref 26.0–34.0)
MCHC: 30.8 g/dL (ref 30.0–36.0)
MCV: 86 fL (ref 78.0–100.0)
PLATELETS: 328 10*3/uL (ref 150–400)
RBC: 2.64 MIL/uL — AB (ref 4.22–5.81)
RDW: 14.4 % (ref 11.5–15.5)
WBC: 7.7 10*3/uL (ref 4.0–10.5)

## 2015-11-28 LAB — BASIC METABOLIC PANEL
Anion gap: 8 (ref 5–15)
BUN: 6 mg/dL (ref 6–20)
CHLORIDE: 110 mmol/L (ref 101–111)
CO2: 23 mmol/L (ref 22–32)
Calcium: 8.7 mg/dL — ABNORMAL LOW (ref 8.9–10.3)
Creatinine, Ser: 0.58 mg/dL — ABNORMAL LOW (ref 0.61–1.24)
GFR calc Af Amer: >60 mL/min (ref >60)
GFR calc non Af Amer: >60 mL/min (ref >60)
Glucose, Bld: 89 mg/dL (ref 65–99)
Potassium: 3.2 mmol/L — ABNORMAL LOW (ref 3.5–5.1)
SODIUM: 141 mmol/L (ref 135–145)

## 2015-11-28 LAB — MAGNESIUM
Magnesium: 1.5 mg/dL — ABNORMAL LOW (ref 1.7–2.4)
Magnesium: 2 mg/dL (ref 1.7–2.4)

## 2015-11-28 LAB — PHOSPHORUS: PHOSPHORUS: 2.5 mg/dL (ref 2.5–4.6)

## 2015-11-28 MED ORDER — DEXTROSE 5 % IV SOLN
1.0000 g | INTRAVENOUS | Status: DC
  Administered 2015-11-28 – 2015-11-29 (×2): 1 g via INTRAVENOUS
  Filled 2015-11-28 (×2): qty 10

## 2015-11-28 MED ORDER — GABAPENTIN 300 MG PO CAPS
300.0000 mg | ORAL_CAPSULE | Freq: Three times a day (TID) | ORAL | Status: DC
  Administered 2015-11-28 – 2015-12-02 (×13): 300 mg via ORAL
  Filled 2015-11-28 (×17): qty 1

## 2015-11-28 MED ORDER — PANTOPRAZOLE SODIUM 40 MG IV SOLR
40.0000 mg | Freq: Two times a day (BID) | INTRAVENOUS | Status: DC
  Administered 2015-11-28 – 2015-11-29 (×4): 40 mg via INTRAVENOUS
  Filled 2015-11-28 (×3): qty 40

## 2015-11-28 MED ORDER — BACLOFEN 10 MG PO TABS
10.0000 mg | ORAL_TABLET | Freq: Three times a day (TID) | ORAL | Status: DC
  Administered 2015-11-28 – 2015-12-02 (×13): 10 mg via ORAL
  Filled 2015-11-28 (×17): qty 1

## 2015-11-28 MED ORDER — POTASSIUM CHLORIDE 10 MEQ/50ML IV SOLN
10.0000 meq | INTRAVENOUS | Status: AC
  Administered 2015-11-28 (×3): 10 meq via INTRAVENOUS
  Filled 2015-11-28 (×3): qty 50

## 2015-11-28 MED ORDER — INFLUENZA VAC SPLIT QUAD 0.5 ML IM SUSY: 0.5000 mL | PREFILLED_SYRINGE | INTRAMUSCULAR | Status: DC

## 2015-11-28 MED ORDER — FENTANYL CITRATE (PF) 100 MCG/2ML IJ SOLN
25.0000 ug | Freq: Once | INTRAMUSCULAR | Status: AC
  Administered 2015-11-28: 25 ug via INTRAVENOUS
  Filled 2015-11-28: qty 2

## 2015-11-28 MED ORDER — SODIUM CHLORIDE 0.9 % IV SOLN
6.0000 g | Freq: Once | INTRAVENOUS | Status: AC
  Administered 2015-11-28: 6 g via INTRAVENOUS
  Filled 2015-11-28: qty 12

## 2015-11-28 NOTE — Progress Notes (Signed)
[employer] ADULT ICU REPLACEMENT PROTOCOL FOR AM LAB REPLACEMENT ONLY  The patient does apply for the [facility] Adult ICU Electrolyte Replacment Protocol based on the criteria listed below:   1. Is GFR >/= 40 ml/min? Yes.    Patient's GFR today is >60 2. Is urine output >/= 0.5 ml/kg/hr for the last 6 hours? Yes.   Patient's UOP is 1.5 ml/kg/hr 3. Is BUN < 60 mg/dL? Yes.    Patient's BUN today is 6 4. Abnormal electrolyte(s): Potassium, 3.2, Magnesium, 1.5 5. Ordered repletion with: [facility] adult ICU replacement protocol 6. If a panic level lab has been reported, has the CCM MD in charge been notified? Yes.  .   Physician:  Dr. Boone Master  [facility], Darrick Huntsman E 11/28/2015 6:22 AM

## 2015-11-28 NOTE — Clinical Documentation Improvement (Signed)
Critical Care  Can the diagnosis of Sepsis be further specified? Please document findings in next progress note; not in BPA drop down box. Thank you!   Sepsis secondary to UTI as documented  Sepsis secondary to UTI secondary to indwelling foley  Other  Clinically Undetermined  Supporting Information:  Foley replaced at SNF prior to arrival and it was noted that > 1L purulent urine out (ED note)  Please exercise your independent, professional judgment when responding. A specific answer is not anticipated or expected.  Thank You, Zoila Shutter RN, BSN, [place] [phone]; Cell: [phone]

## 2015-11-28 NOTE — Clinical Documentation Improvement (Signed)
Critical Care  Can the diagnosis of Hypotension be further specified? Please document your findings in next progress note; not in the BPA drop down box. Thank you!   Other  Clinically Undetermined  Supporting Information:  Diagnosed with Sepsis  BP's were running at time of admission: 68/53 with Map of 59; 82/48 with Map of 60  Fluid resuscitation given; antibiotics initiated  Please exercise your independent, professional judgment when responding. A specific answer is not anticipated or expected.  Thank You, Zoila Shutter RN, BSN, [place] [phone]; Cell: [phone]
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
## 2015-11-28 NOTE — Progress Notes (Signed)
PULMONARY / CRITICAL CARE MEDICINE   Name: Phillip [name] MRN: [id number] DOB: [birth year]    ADMISSION DATE:  11/26/2015  REFERRING MD:  EDP  CHIEF COMPLAINT:  AMS, sepsis   HISTORY OF PRESENT ILLNESS:   49yo male smoker, SNF resident with hx quadriplegia r/t fairly recent cervical spine injury (05/2015), ongoing issues with R ankle wound with VAC post ORIF in setting fx presented 12/17 with AMS.  Over past several days has c/o abd distension and urinary retention despite indwelling foley catheter.  On 11/1743 catheter was replaced with >1L purulent urine out.  In ER pt was hypotensive with progressive lethargy and was intubated for airway protection.   Labs notable for WBC 23, Scr 4.1, lactate 2.17.  PCCM called for ICU admission.   Pt's cousin who sees him at SNF every weekend says that he is normally awake, alert, appropriate, feeds himself.    SUBJECTIVE:  Hungry No distress No pressors  VITAL SIGNS: BP 118/74 mmHg  Pulse 104  Temp(Src) 99.2 F (37.3 C) (Oral)  Resp 26  Ht 5\' 10"  (1.778 m)  Wt 66.9 kg (147 lb 7.8 oz)  BMI 21.16 kg/m2  SpO2 100%  HEMODYNAMICS:    VENTILATOR SETTINGS:    INTAKE / OUTPUT: I/O last 3 completed shifts: In: 2721.6 [I.V.:2301.6; NG/GT:70; IV Piggyback:350] Out: [id number] [Urine:3210; Emesis/NG output:100]  PHYSICAL EXAMINATION: General:  Chronically ill appearing male, awake Neuro:  Calm, following commands. Able to shrug shoulders HEENT:  Mm dry, ETT Cardiovascular:  s1s2 rrr Lungs:  Slight coarse apical  Abdomen:  Round, soft, +bs, foley with urine more clear Musculoskeletal:  R ankle wound with VAC, L great toe dark, scant BLE edema  LABS:  BMET  Recent Labs Lab 11/26/15 1822 11/27/15 0420 11/28/15 0420  NA 138 140 141  K 3.1* 3.0* 3.2*  CL 111 111 110  CO2 19* 20* 23  BUN 40* 27* 6  CREATININE 1.54* 0.98 0.58*  GLUCOSE 112* 101* 89    Electrolytes  Recent Labs Lab 11/26/15 1105 11/26/15 1822 11/27/15 0420  11/28/15 0420  CALCIUM 7.9* 8.4* 8.6* 8.7*  MG 1.9  --  1.9 1.5*  PHOS 4.0  --  2.0* 2.5    CBC  Recent Labs Lab 11/26/15 0600 11/26/15 0631 11/27/15 0420  WBC 23.0*  --  12.7*  HGB 10.0* 12.2* 7.1*  HCT 32.6* 36.0* 21.9*  PLT 458*  --  300    Coag's No results for input(s): APTT, INR in the last 168 hours.  Sepsis Markers  Recent Labs Lab 11/26/15 0600  11/26/15 1105 11/26/15 1822 11/26/15 2107  LATICACIDVEN  --   < > 2.4* 1.0 1.2  PROCALCITON 12.37  --   --   --   --   < > = values in this interval not displayed.  ABG  Recent Labs Lab 11/26/15 0851  PHART 7.489*  PCO2ART 25.2*  PO2ART 147.0*    Liver Enzymes  Recent Labs Lab 11/26/15 0600  AST 18  ALT 12*  ALKPHOS 140*  BILITOT 0.7  ALBUMIN 3.1*    Cardiac Enzymes No results for input(s): TROPONINI, PROBNP in the last 168 hours.  Glucose  Recent Labs Lab 11/26/15 0524 11/26/15 1011  GLUCAP 124* 141*    Imaging No results found.   STUDIES:  CT head 12/17>>> no acute process Renal u/s 12/17>>> no evidence for hydronephrosis  CULTURES: Urine 12/17>>> BC x 2 12/17>>>  ANTIBIOTICS: Vanc 12/17>>>12/19 Zosyn 12/17>>>12/19 Ceftriaxone 12/19>>>  SIGNIFICANT EVENTS: Extubated  yesterday  LINES/TUBES: ETT 12/17>>> 12/18  DISCUSSION:  ASSESSMENT / PLAN:  PULMONARY Acute respiratory failure r/t AMS  P:   Push pulm hygiene IS  CARDIOVASCULAR Sepsis - r/t UTI +/- R ankle wound  Hx HTN  P: High risk post obtrusive diureses , keep IVF to 50cc/h abx as below  Continued to Hold home norvasc, lopressor Dc line  RENAL AKI, resolved Obstructive uropathy - r/t clogged foley catheter UTI At risk post obstructive diuresis Hypok, mag P:   Was neg 6oo cc, keep some volume follow closely output, I/O F/u chem  k supp Mag supp  GASTROINTESTINAL FOBT POS - no obvious s/s bleeding, not grossly anemic  See heme P:   PPI consider to bid With such drop hct, will call GI pre  dc Trend CBC in pm and am   HEMATOLOGIC Anemia - mild  Dilution, blood loss? P:  F/u CBC in afternoon SCD's  See GI  INFECTIOUS UTI  Sepsis  ?R ankle wound infection  P:   Dc vanc, zosyn Add ceftriaxone as remains culture neg Wound nurse to remove wound VAC and assess - may need ortho to assist (Just had I&D 12/3 by Dr. Marlou Sa), await wound care consult  ENDOCRINE Hyperglycemia - no known hx DM   P:   Follow glucose on chem, start SSI if consistently >180  NEUROLOGIC AMS - likely r/t hypotension, sepsis  Paraplegia  P:   Re add home neurontin, amitriptyline, baclofen for now  To floor, triad  FAMILY  - Updates:  Cousin updated 12/17 in [place]. Titus Mould, MD, [place] Pgr: [facility]

## 2015-11-28 NOTE — Consult Note (Addendum)
WOC wound consult note Reason for Consult: Consult requested to change Vac dressing to right heel wound.  Pt is familiar to [facility] team from previous admission and was discharged with a Prevena dressing to right ankle incision and Vac dressing to right heel.  Removed his home Vac machine which both sites were Yed together to cont suction and machine was left at the bedside with charger intact.  Instructed patient that he should be placed on the hospital Vac machine while inpatient and can resume use of the home machine upon discharge. Prevena dressings are usually removed after 1 week.  Discussed plan of care with Dr Marlou Sa of the ortho service via phone and he ordered that this be discontinued. 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 White macerated edges surrounding wound from moisture leaking around previous Vac seal, making it difficult to adhere Vac drape.  Applied barrier ring surrounding wound to maintain seal and one piece black sponge to 175mm cont suction.  Pt tolerated without c/o pain.  Right inner foot with deep tissue injury 4X4cm with darker-colored intact skin, no topical treatment needed at this time.  Right outer ankle with full thickness incision with sutures intact and well-approximated, white antibiotic beads spilling out around incision line over the ankle.  Applied dry ABD pad as requested and pt is in Prevalon boots bilat to reduce pressure. Plan for dressing change Q M/W/F.   Julien Girt MSN, RN, [place], [place], [place]

## 2015-11-28 NOTE — Clinical Social Work Note (Addendum)
CSW received consult, patient transferred to 5w33 from 2M11 patient from [facility] and plan to return per daughter.  This CSW to sign off unit CSW given handoff.  Jones Broom. [place], MSW, [place] 11/28/2015 6:36 PM

## 2015-11-29 ENCOUNTER — Encounter (HOSPITAL_COMMUNITY)
Admission: EM | Disposition: A | Discharge status: Skilled Nursing Facility | Payer: Self-pay | Source: Home / Self Care | Attending: Oncology

## 2015-11-29 LAB — URINE CULTURE

## 2015-11-29 LAB — CBC
HEMATOCRIT: 25.3 % — AB (ref 39.0–52.0)
Hemoglobin: 7.8 g/dL — ABNORMAL LOW (ref 13.0–17.0)
MCH: 26.6 pg (ref 26.0–34.0)
MCHC: 30.8 g/dL (ref 30.0–36.0)
MCV: 86.3 fL (ref 78.0–100.0)
Platelets: 324 10*3/uL (ref 150–400)
RBC: 2.93 MIL/uL — ABNORMAL LOW (ref 4.22–5.81)
RDW: 14.2 % (ref 11.5–15.5)
WBC: 6.7 10*3/uL (ref 4.0–10.5)

## 2015-11-29 LAB — BASIC METABOLIC PANEL
ANION GAP: 9 (ref 5–15)
CALCIUM: 9.2 mg/dL (ref 8.9–10.3)
CO2: 22 mmol/L (ref 22–32)
CREATININE: 0.42 mg/dL — AB (ref 0.61–1.24)
Chloride: 107 mmol/L (ref 101–111)
GFR calc Af Amer: >60 mL/min (ref >60)
GFR calc non Af Amer: >60 mL/min (ref >60)
GLUCOSE: 91 mg/dL (ref 65–99)
Potassium: 3.5 mmol/L (ref 3.5–5.1)
Sodium: 138 mmol/L (ref 135–145)

## 2015-11-29 LAB — PHOSPHORUS: Phosphorus: 3.7 mg/dL (ref 2.5–4.6)

## 2015-11-29 LAB — MAGNESIUM: Magnesium: 1.7 mg/dL (ref 1.7–2.4)

## 2015-11-29 SURGERY — CANCELLED PROCEDURE

## 2015-11-29 SURGERY — EGD (ESOPHAGOGASTRODUODENOSCOPY)
Anesthesia: Moderate Sedation

## 2015-11-29 MED ORDER — CEFTRIAXONE SODIUM 2 G IJ SOLR
2.0000 g | INTRAMUSCULAR | Status: DC
  Administered 2015-11-30: 2 g via INTRAVENOUS
  Filled 2015-11-29: qty 2

## 2015-11-29 MED ORDER — FENTANYL CITRATE (PF) 100 MCG/2ML IJ SOLN
INTRAMUSCULAR | Status: AC
  Filled 2015-11-29: qty 2

## 2015-11-29 MED ORDER — MIDAZOLAM HCL 5 MG/ML IJ SOLN
INTRAMUSCULAR | Status: AC
  Filled 2015-11-29: qty 2

## 2015-11-29 MED ORDER — SODIUM CHLORIDE 0.9 % IV SOLN: INTRAVENOUS | Status: DC

## 2015-11-29 NOTE — Progress Notes (Signed)
Patient was schedule for elective EGD this morning a cause of the drop in hemoglobin. He came down to the [facility] unit and was having shaking chills. His temperature was 101. He denies abdominal pain or coughing. In view of the elective nature of the EGD, this was canceled pending further evaluation of his fever and chills. Abdomen - soft nontender nondistended with good bowel sounds. Lungs - grossly clear Gen. - patient alert and oriented but having chills under blankets. We will notify the hospitalist. Procedure canceled for now will resume diet.

## 2015-11-29 NOTE — Care Management Note (Signed)
Case Management Note  Patient Details  Name: Phillip [name] MRN: [id number] Date of Birth: [birth year]  Subjective/Objective:       Patient is from [facility], [place] referral.  Patient has a wound vac that belongs to [facility] in his room and this must go back to the facility with him at discharge, RN  And Charge RN aware.               Action/Plan:   Expected Discharge Date:                  Expected Discharge Plan:  Skilled Nursing Facility  In-House Referral:  Clinical Social Work  Discharge planning Services  CM Consult  Post Acute Care Choice:    Choice offered to:     DME Arranged:    DME Agency:     HH Arranged:    [place] Agency:     Status of Service:  Completed, signed off  Medicare Important Message Given:    Date Medicare IM Given:    Medicare IM give by:    Date Additional Medicare IM Given:    Additional Medicare Important Message give by:     If discussed at [place] of Stay Meetings, dates discussed:    Additional Comments:  Zenon Mayo, RN 11/29/2015, 2:37 PM

## 2015-11-29 NOTE — Progress Notes (Signed)
Pt noticed a new blister on right wrist. Upon assessment blister is intact, no drainage, yellowish clear color. Pt has had blisters in past.

## 2015-11-29 NOTE — Progress Notes (Addendum)
PATIENT DETAILS Name: Phillip [name] Age: 49 y.o. Sex: male Date of Birth: 26-Jul-1966 Admit Date: 11/26/2015 Admitting Physician Collene Gobble, MD PCP:No PCP Per Patient  Subjective: Awake alert-EGD canceled this morning because of fever and chills.  Assessment/Plan: Principal Problem: Severe sepsis: Due to UTI with chronic indwelling Foley catheter. Still febrile, but sepsis pathophysiology clearly better. Continue Rocephin and other supportive measures  Active Problems: Complicated UTI: Due to to chronic indwelling Foley catheter. Still febrile, but no leukocytosis. Nontoxic appearing and overall improved. Change Rocephin to 2 g. Urine culture positive for both Escherichia coli and Proteus (both sensitive to Rocephin)  Acute encephalopathy: Resolved. Secondary to sepsis with UTI. CT head negative on admission.  Acute hypoxic respiratory failure: In a setting of altered mental status. Intubated for airway protection on 12/17, extubated on 12/18: Doing well on room air, lungs clear on exam.  Anemia: Suspect multifactorial-chronic anemia baseline-worsened by acute illness and hemodilution. Some concern for GI bleeding (FOBT+-but no overt bleed)-gastroenterology following and EGD was planned for today-canceled because of fever. Follow CBC.  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 He finished his course of antibiotics on 12/10. Wound care following.  Obstructive uropathy: Resolved, secondary to clogged Foley catheter, Foley replaced on admission. Renal ultrasound on 12/17 negative for hydronephrosis  History of quadriplegia following a recent cervical spine injury (June 2016): Neurologic status unchanged-able to move bilateral upper extremities, but no movement of lower  extremities.  Note-spoke with [facility]-they will assume primary service on 12/21  Disposition: Remain inpatient-back to SNF over next2-3 days  Antimicrobial agents  See below  Anti-infectives    Start     Dose/Rate Route Frequency Ordered Stop   11/28/15 1200  cefTRIAXone (ROCEPHIN) 1 g in dextrose 5 % 50 mL IVPB     1 g 100 mL/hr over 30 Minutes Intravenous Every 24 hours 11/28/15 0840     11/28/15 0600  vancomycin (VANCOCIN) IVPB 1000 mg/200 mL premix  Status:  Discontinued     1,000 mg 200 mL/hr over 60 Minutes Intravenous Every 48 hours 11/26/15 0920 11/27/15 1739   11/27/15 1800  vancomycin (VANCOCIN) IVPB 750 mg/150 ml premix  Status:  Discontinued     750 mg 150 mL/hr over 60 Minutes Intravenous Every 12 hours 11/27/15 1739 11/28/15 0840   11/26/15 1600  piperacillin-tazobactam (ZOSYN) IVPB 3.375 g  Status:  Discontinued     3.375 g 12.5 mL/hr over 240 Minutes Intravenous 3 times per day 11/26/15 1546 11/28/15 0840   11/26/15 1400  piperacillin-tazobactam (ZOSYN) IVPB 2.25 g  Status:  Discontinued     2.25 g 100 mL/hr over 30 Minutes Intravenous 3 times per day 11/26/15 0731 11/26/15 1545   11/26/15 0545  piperacillin-tazobactam (ZOSYN) IVPB 3.375 g     3.375 g 100 mL/hr over 30 Minutes Intravenous  Once 11/26/15 0538 11/26/15 0633   11/26/15 0545  vancomycin (VANCOCIN) IVPB 1000 mg/200 mL premix     1,000 mg 200 mL/hr over 60 Minutes Intravenous  Once 11/26/15 [id number] 11/26/15 0703      DVT Prophylaxis: SCD's-given concern for GI bleed  Code Status: Full code   Family Communication Sister at bedside  Procedures: ETT 12/17>>> 12/18  CONSULTS:  GI  Time spent 30 minutes-Greater than 50%  of this time was spent in counseling, explanation of diagnosis, planning of further management, and coordination of care.  MEDICATIONS: Scheduled Meds: . amLODipine  10 mg Oral Daily  . antiseptic oral rinse  7 mL Mouth Rinse QID  . baclofen  10 mg Oral TID  . cefTRIAXone  (ROCEPHIN)  IV  1 g Intravenous Q24H  . chlorhexidine gluconate  15 mL Mouth Rinse BID  . gabapentin  300 mg Oral TID  . Influenza vac split quadrivalent PF  0.5 mL Intramuscular Tomorrow-1000  . metoprolol tartrate  12.5 mg Oral BID  . pantoprazole (PROTONIX) IV  40 mg Intravenous Q12H   Continuous Infusions: . sodium chloride 50 mL/hr at 11/29/15 0009   PRN Meds:.acetaminophen    PHYSICAL EXAM: Vital signs in last 24 hours: Filed Vitals:   11/29/15 0542 11/29/15 0835 11/29/15 1000 11/29/15 1325  BP: 107/68 129/70 124/70 106/64  Pulse: 97 105 107 96  Temp: 99.7 F (37.6 C) 101 F (38.3 C)  99.2 F (37.3 C)  TempSrc: Oral Oral  Oral  Resp: 18 26  18   Height:      Weight: 68.7 kg (151 lb 7.3 oz)     SpO2: 100% 99%  100%    Weight change: 1.5 kg (3 lb 4.9 oz) Filed Weights   11/28/15 0420 11/28/15 1350 11/29/15 0542  Weight: 66.9 kg (147 lb 7.8 oz) 68.4 kg (150 lb 12.7 oz) 68.7 kg (151 lb 7.3 oz)   Body mass index is 22.36 kg/(m^2).   Gen Exam: Awake and alert with clear speech. Neck: Supple, No JVD.   Chest: B/L Clear.   CVS: S1 S2 Regular, no murmurs.  Abdomen: soft, BS +, non tender, non distended.  Extremities: no edema, lower extremities warm to touch. Neurologic: Able to lift bilateral upper extremities-but with finger contractures bilaterally, no movement in lower extremities at all Skin: No Rash.    Intake/Output from previous day:  Intake/Output Summary (Last 24 hours) at 11/29/15 1448 Last data filed at 11/29/15 [id number]  Gross per 24 hour  Intake 1206.83 ml  Output   2375 ml  Net -1168.17 ml     LAB RESULTS: CBC  Recent Labs Lab 11/26/15 0600 11/26/15 0631 11/27/15 0420 11/28/15 1945 11/29/15 0620  WBC 23.0*  --  12.7* 7.7 6.7  HGB 10.0* 12.2* 7.1* 7.0* 7.8*  HCT 32.6* 36.0* 21.9* 22.7* 25.3*  PLT 458*  --  300 328 324  MCV 87.9  --  84.9 86.0 86.3  MCH 27.0  --  27.5 26.5 26.6  MCHC 30.7  --  32.4 30.8 30.8  RDW 14.8  --  14.5 14.4 14.2   LYMPHSABS 2.5  --   --   --   --   MONOABS 1.9*  --   --   --   --   EOSABS 0.0  --   --   --   --   BASOSABS 0.0  --   --   --   --     Chemistries   Recent Labs Lab 11/26/15 1105 11/26/15 1822 11/27/15 0420 11/28/15 0420 11/28/15 1945 11/29/15 0620  NA 138 138 140 141  --  138  K 3.6 3.1* 3.0* 3.2*  --  3.5  CL 109 111 111 110  --  107  CO2 18* 19* 20* 23  --  22  GLUCOSE 133* 112* 101* 89  --  91  BUN 44* 40* 27* 6  --  <5*  CREATININE 2.45*  1.54* 0.98 0.58*  --  0.42*  CALCIUM 7.9* 8.4* 8.6* 8.7*  --  9.2  MG 1.9  --  1.9 1.5* 2.0 1.7    CBG:  Recent Labs Lab 11/26/15 0524 11/26/15 1011  GLUCAP 124* 141*    GFR Estimated Creatinine Clearance: 110.9 mL/min (by C-G formula based on Cr of 0.42).  Coagulation profile No results for input(s): INR, PROTIME in the last 168 hours.  Cardiac Enzymes No results for input(s): CKMB, TROPONINI, MYOGLOBIN in the last 168 hours.  Invalid input(s): CK  Invalid input(s): POCBNP No results for input(s): DDIMER in the last 72 hours. No results for input(s): HGBA1C in the last 72 hours. No results for input(s): CHOL, HDL, LDLCALC, TRIG, CHOLHDL, LDLDIRECT in the last 72 hours. No results for input(s): TSH, T4TOTAL, T3FREE, THYROIDAB in the last 72 hours.  Invalid input(s): FREET3 No results for input(s): VITAMINB12, FOLATE, FERRITIN, TIBC, IRON, RETICCTPCT in the last 72 hours. No results for input(s): LIPASE, AMYLASE in the last 72 hours.  Urine Studies No results for input(s): UHGB, CRYS in the last 72 hours.  Invalid input(s): UACOL, UAPR, USPG, UPH, UTP, UGL, UKET, UBIL, UNIT, UROB, ULEU, UEPI, UWBC, URBC, UBAC, CAST, UCOM, BILUA  MICROBIOLOGY: Recent Results (from the past 240 hour(s))  Blood Culture (routine x 2)     Status: None (Preliminary result)   Collection Time: 11/26/15  6:00 AM  Result Value Ref Range Status   Specimen Description BLOOD RIGHT ARM  Final   Special Requests BOTTLES DRAWN AEROBIC AND  ANAEROBIC 10ML  Final   Culture NO GROWTH 3 DAYS  Final   Report Status PENDING  Incomplete  Blood Culture (routine x 2)     Status: None (Preliminary result)   Collection Time: 11/26/15  6:00 AM  Result Value Ref Range Status   Specimen Description BLOOD LEFT HAND  Final   Special Requests IN PEDIATRIC BOTTLE 3ML  Final   Culture NO GROWTH 3 DAYS  Final   Report Status PENDING  Incomplete  C difficile quick scan w PCR reflex     Status: None   Collection Time: 11/26/15  7:07 AM  Result Value Ref Range Status   C Diff antigen NEGATIVE NEGATIVE Final   C Diff toxin NEGATIVE NEGATIVE Final   C Diff interpretation Negative for toxigenic C. difficile  Final  Urine culture     Status: None   Collection Time: 11/26/15  7:46 AM  Result Value Ref Range Status   Specimen Description URINE, RANDOM  Final   Special Requests NONE  Final   Culture   Final    >=100,000 COLONIES/mL PROTEUS MIRABILIS >=100,000 COLONIES/mL ESCHERICHIA COLI    Report Status 11/29/2015 FINAL  Final   Organism ID, Bacteria PROTEUS MIRABILIS  Final   Organism ID, Bacteria ESCHERICHIA COLI  Final      Susceptibility   Escherichia coli - MIC*    AMPICILLIN 8 SENSITIVE Sensitive     CEFAZOLIN <=4 SENSITIVE Sensitive     CEFTRIAXONE <=1 SENSITIVE Sensitive     CIPROFLOXACIN >=4 RESISTANT Resistant     GENTAMICIN <=1 SENSITIVE Sensitive     IMIPENEM <=0.25 SENSITIVE Sensitive     NITROFURANTOIN <=16 SENSITIVE Sensitive     TRIMETH/SULFA <=20 SENSITIVE Sensitive     AMPICILLIN/SULBACTAM 4 SENSITIVE Sensitive     PIP/TAZO <=4 SENSITIVE Sensitive     * >=100,000 COLONIES/mL ESCHERICHIA COLI   Proteus mirabilis - MIC*    AMPICILLIN >=32 RESISTANT Resistant  CEFAZOLIN 8 SENSITIVE Sensitive     CEFTRIAXONE <=1 SENSITIVE Sensitive     CIPROFLOXACIN >=4 RESISTANT Resistant     GENTAMICIN >=16 RESISTANT Resistant     IMIPENEM 4 SENSITIVE Sensitive     NITROFURANTOIN 128 RESISTANT Resistant     TRIMETH/SULFA >=320  RESISTANT Resistant     AMPICILLIN/SULBACTAM >=32 RESISTANT Resistant     PIP/TAZO <=4 SENSITIVE Sensitive     * >=100,000 COLONIES/mL PROTEUS MIRABILIS  MRSA PCR Screening     Status: None   Collection Time: 11/26/15  9:41 AM  Result Value Ref Range Status   MRSA by PCR NEGATIVE NEGATIVE Final    Comment:        The GeneXpert MRSA Assay (FDA approved for NASAL specimens only), is one component of a comprehensive MRSA colonization surveillance program. It is not intended to diagnose MRSA infection nor to guide or monitor treatment for MRSA infections.     RADIOLOGY STUDIES/RESULTS: Dg Abd 1 View  11/26/2015  CLINICAL DATA:  Patient status post enteric tube placement. EXAM: ABDOMEN - 1 VIEW COMPARISON:  CT abdomen pelvis 06/11/2015; abdominal radiograph 05/24/2015. FINDINGS: Enteric tube tip and side-port project over the left upper quadrant, the side port may be within the distal esophagus. Gas is demonstrated within nondilated loops of large and small bowel. There is a marked amount of stool throughout the visualized colon. IMPRESSION: Recommend slight advancement of the enteric tube as its side port may be within the distal esophagus. Marked amount of stool throughout the colon as can be seen with constipation. Electronically Signed   By: Lovey Newcomer M.D.   On: 11/26/2015 08:40   Ct Head Wo Contrast  11/26/2015  CLINICAL DATA:  Patient with history of quadriplegia since from nursing home due to abdominal distension. EXAM: CT HEAD WITHOUT CONTRAST TECHNIQUE: Contiguous axial images were obtained from the base of the skull through the vertex without intravenous contrast. COMPARISON:  Brain CT 06/07/2015. FINDINGS: Ventricles and sulci are appropriate for patient's age. No evidence for acute cortically based infarct, intracranial hemorrhage, mass lesion or mass-effect. Mucosal thickening within the ethmoid air cells. Mastoid air cells are unremarkable. Calvarium intact. IMPRESSION: No  acute intracranial process. Electronically Signed   By: Lovey Newcomer M.D.   On: 11/26/2015 08:47   US Renal Port  11/26/2015  CLINICAL DATA:  49 year old male with UTI and sepsis. EXAM: RENAL / URINARY TRACT ULTRASOUND COMPLETE COMPARISON:  06/11/2015 CT. FINDINGS: Right Kidney: Length: 11.8 cm. Echogenicity within normal limits. Mild fullness of the intrarenal collecting system noted, but unchanged from prior CT. There is no evidence of solid mass or hydronephrosis. Left Kidney: Length: 12.8 cm. Echogenicity within normal limits. No mass or hydronephrosis visualized. Bladder: Foley catheter is noted within a collapsed bladder. IMPRESSION: No evidence of acute abnormality or hydronephrosis. Mild right intrarenal collecting system fullness -unchanged 06/11/2015. Unremarkable kidneys otherwise. Foley catheter within the bladder. Electronically Signed   By: Margarette Canada M.D.   On: 11/26/2015 10:58   Dg Chest Port 1 View  11/27/2015  CLINICAL DATA:  49 year old male with respiratory failure. EXAM: PORTABLE CHEST 1 VIEW COMPARISON:  11/26/2015 and prior exams FINDINGS: An endotracheal tube with tip 3.2 cm above the carina, left IJ central venous catheter with tip overlying the upper SVC and NG tube entering the stomach again noted. There is no evidence of focal airspace disease, pulmonary edema, suspicious pulmonary nodule/mass, pleural effusion, or pneumothorax. No acute bony abnormalities are identified. Portion of the right lateral chest  is off the field of view. IMPRESSION: Unchanged appearance of the chest with support apparatus as described. Electronically Signed   By: Margarette Canada M.D.   On: 11/27/2015 09:00   Dg Chest Port 1 View  11/26/2015  CLINICAL DATA:  50 year old male with a history of central line placement EXAM: PORTABLE CHEST 1 VIEW COMPARISON:  11/26/2015 FINDINGS: Cardiomediastinal silhouette unchanged. No evidence of confluent airspace disease. No visualized pneumothorax, with attention to  the left. Interval placement of left IJ central venous catheter, with the tip appearing to terminate in the superior vena cava. Unchanged position of endotracheal tube, terminating suitably above the carina. Unchanged gastric tube, terminating in the left upper quadrant. Overlying EKG leads. Incompletely visualized surgical changes of the cervical region. IMPRESSION: Interval placement of left IJ central venous catheter, with the tip appearing to terminate in the superior vena cava. No visualized pneumothorax. Unchanged additional support apparatus, as above. Signed, Dulcy Fanny. Earleen Newport, DO [facility] Electronically Signed   By: Corrie Mckusick D.O.   On: 11/26/2015 17:32   Dg Chest Port 1 View  11/26/2015  CLINICAL DATA:  Check endotracheal tube placement EXAM: PORTABLE CHEST - 1 VIEW COMPARISON:  11/26/2015 FINDINGS: Endotracheal tube and nasogastric catheter are now seen in satisfactory position. The lungs are well aerated bilaterally. No focal infiltrate or sizable effusion is seen. Postsurgical changes in the cervical spine are noted. IMPRESSION: Tubes and lines as described.  No acute abnormality noted. Electronically Signed   By: Inez Catalina M.D.   On: 11/26/2015 08:41   Dg Chest Port 1 View  11/26/2015  CLINICAL DATA:  Fever and abdominal distention. Chronic Foley catheter use. Wound VAC to the right foot. Old Foley catheter was block to and was subsequently replaced. With new Foley placement purulent bloody urine was obtained. Patient is lethargic and not responsive. EXAM: PORTABLE CHEST 1 VIEW COMPARISON:  11/10/2015 FINDINGS: Normal heart size and pulmonary vascularity. No focal airspace disease or consolidation in the lungs. No blunting of costophrenic angles. No pneumothorax. Mediastinal contours appear intact. Postoperative changes in the lower cervical spine. IMPRESSION: No active disease. Electronically Signed   By: Lucienne Capers M.D.    On: 11/26/2015 06:31   Dg Chest Port 1 View  11/10/2015  CLINICAL DATA:  Fever, sepsis, right ankle infection/cellulitis EXAM: PORTABLE CHEST 1 VIEW COMPARISON:  08/22/2015 FINDINGS: The heart size and mediastinal contours are within normal limits. Both lungs are clear. The visualized skeletal structures are unremarkable. IMPRESSION: No active disease. Electronically Signed   By: Jerilynn Mages.  Shick M.D.   On: 11/10/2015 11:11   Dg Ankle Right Port  11/10/2015  CLINICAL DATA:  Nonhealing wound, status post right ankle fracture ORIF, fever, infection EXAM: PORTABLE RIGHT ANKLE - 2 VIEW COMPARISON:  05/26/2015 FINDINGS: Patient this status post ORIF of the bimalleolar fracture. Diffuse disuse osteopenia evident. No acute osseous finding or hardware abnormality. Normal alignment. No acute fracture. No area of focal bone loss or destruction. Mild diffuse soft tissue swelling without radiopaque foreign body. IMPRESSION: Previous ORIF for a bimalleolar fracture. Diffuse osteopenia Mild soft tissue swelling No acute osseous finding or complicating feature by plain radiography Electronically Signed   By: Jerilynn Mages.  Shick M.D.   On: 11/10/2015 11:10    Oren Binet, MD  [facility] Pager:[phone]  If 7PM-7AM, please contact night-coverage [email or web address] Password [email or web address] 11/29/2015, 2:48 PM   LOS: 3 days

## 2015-11-30 LAB — CBC WITH DIFFERENTIAL/PLATELET
BASOS PCT: 1 %
Basophils Absolute: 0.1 10*3/uL (ref 0.0–0.1)
EOS PCT: 7 %
Eosinophils Absolute: 0.5 10*3/uL (ref 0.0–0.7)
HEMATOCRIT: 25.3 % — AB (ref 39.0–52.0)
HEMOGLOBIN: 8.2 g/dL — AB (ref 13.0–17.0)
LYMPHS PCT: 33 %
Lymphs Abs: 2.5 10*3/uL (ref 0.7–4.0)
MCH: 27.6 pg (ref 26.0–34.0)
MCHC: 32.4 g/dL (ref 30.0–36.0)
MCV: 85.2 fL (ref 78.0–100.0)
MONOS PCT: 14 %
Monocytes Absolute: 1.1 10*3/uL — ABNORMAL HIGH (ref 0.1–1.0)
NEUTROS PCT: 45 %
Neutro Abs: 3.3 10*3/uL (ref 1.7–7.7)
PLATELETS: 366 10*3/uL (ref 150–400)
RBC: 2.97 MIL/uL — ABNORMAL LOW (ref 4.22–5.81)
RDW: 13.8 % (ref 11.5–15.5)
WBC: 7.5 10*3/uL (ref 4.0–10.5)

## 2015-11-30 LAB — BASIC METABOLIC PANEL
Anion gap: 11 (ref 5–15)
CHLORIDE: 108 mmol/L (ref 101–111)
CO2: 23 mmol/L (ref 22–32)
Calcium: 8.6 mg/dL — ABNORMAL LOW (ref 8.9–10.3)
Creatinine, Ser: 0.51 mg/dL — ABNORMAL LOW (ref 0.61–1.24)
GFR calc non Af Amer: >60 mL/min (ref >60)
Glucose, Bld: 122 mg/dL — ABNORMAL HIGH (ref 65–99)
POTASSIUM: 3.6 mmol/L (ref 3.5–5.1)
SODIUM: 142 mmol/L (ref 135–145)

## 2015-11-30 LAB — RETICULOCYTES
RBC.: 3.01 MIL/uL — AB (ref 4.22–5.81)
Retic Count, Absolute: 21.1 10*3/uL (ref 19.0–186.0)
Retic Ct Pct: 0.7 % (ref 0.4–3.1)

## 2015-11-30 LAB — LACTATE DEHYDROGENASE: LDH: 178 U/L (ref 98–192)

## 2015-11-30 LAB — BILIRUBIN, TOTAL: Total Bilirubin: 0.3 mg/dL (ref 0.3–1.2)

## 2015-11-30 MED ORDER — PANTOPRAZOLE SODIUM 40 MG PO TBEC
40.0000 mg | DELAYED_RELEASE_TABLET | Freq: Two times a day (BID) | ORAL | Status: DC
  Administered 2015-11-30 – 2015-12-02 (×5): 40 mg via ORAL
  Filled 2015-11-30 (×5): qty 1

## 2015-11-30 MED ORDER — AMLODIPINE BESYLATE 5 MG PO TABS
5.0000 mg | ORAL_TABLET | Freq: Every day | ORAL | Status: DC
  Administered 2015-11-30 – 2015-12-02 (×3): 5 mg via ORAL
  Filled 2015-11-30 (×3): qty 1

## 2015-11-30 MED ORDER — FERROUS SULFATE 325 (65 FE) MG PO TABS
325.0000 mg | ORAL_TABLET | Freq: Every day | ORAL | Status: DC
  Administered 2015-11-30 – 2015-12-02 (×3): 325 mg via ORAL
  Filled 2015-11-30 (×3): qty 1

## 2015-11-30 MED ORDER — SULFAMETHOXAZOLE-TRIMETHOPRIM 400-80 MG/5ML IV SOLN
15.0000 mg/kg/d | Freq: Three times a day (TID) | INTRAVENOUS | Status: DC
  Administered 2015-11-30 – 2015-12-02 (×5): 345.6 mg via INTRAVENOUS
  Filled 2015-11-30 (×8): qty 21.6

## 2015-11-30 MED ORDER — CHLORHEXIDINE GLUCONATE CLOTH 2 % EX PADS
6.0000 | MEDICATED_PAD | Freq: Every day | CUTANEOUS | Status: DC
  Administered 2015-11-30 – 2015-12-01 (×2): 6 via TOPICAL

## 2015-11-30 NOTE — Consult Note (Signed)
WOC wound consult note Reason for Consult: Vac dressing changed. Instructed patient that he can resume use of the home machine upon discharge.  Wound type: Stage 4 pressure ulcer to right heel; appearance unchanged from previous assessment. Applied one piece of black sponge to 137mm cont suction. Pt tolerated without c/o pain. Right inner foot with previous deep tissue injury; has evolved into stage 2 wound, pink and dry. Right outer ankle with full thickness incision with sutures intact and well-approximated, white antibiotic beads spilling out around incision line over the ankle. Applied dry ABD pad as requested and pt is in Prevalon boots bilat to reduce pressure. Plan for dressing change Q M/W/F.  Julien Girt MSN, RN, [place], [place], [place]

## 2015-11-30 NOTE — Progress Notes (Signed)
ANTIBIOTIC CONSULT NOTE - INITIAL  Pharmacy Consult for septra Indication: UTI sepsis  No Known Allergies  Patient Measurements: Height: 5\' 9"  (175.3 cm) Weight: 152 lb 8.9 oz (69.2 kg) IBW/kg (Calculated) : 70.7  Vital Signs: Temp: 98.8 F (37.1 C) (12/21 1325) Temp Source: Oral (12/21 1325) BP: 114/70 mmHg (12/21 1325) Pulse Rate: 101 (12/21 1325) Intake/Output from previous day: 12/20 0701 - 12/21 0700 In: 1389.2 [P.O.:240; I.V.:1149.2] Out: 3600 [Urine:3600] Intake/Output from this shift: Total I/O In: 720 [P.O.:720] Out: 1350 [Urine:1350]  Labs:  Recent Labs  11/28/15 0420 11/28/15 1945 11/29/15 0620 11/30/15 0958  WBC  --  7.7 6.7 7.5  HGB  --  7.0* 7.8* 8.2*  PLT  --  328 324 366  CREATININE 0.58*  --  0.42* 0.51*   Estimated Creatinine Clearance: 111.7 mL/min (by C-G formula based on Cr of 0.51).  Recent Labs  11/27/15 1639  VANCORANDOM <4     Microbiology: Recent Results (from the past 720 hour(s))  Blood Culture (routine x 2)     Status: None   Collection Time: 11/10/15 10:50 AM  Result Value Ref Range Status   Specimen Description BLOOD RIGHT ARM  Final   Special Requests BOTTLES DRAWN AEROBIC AND ANAEROBIC 5CC  Final   Culture NO GROWTH 5 DAYS  Final   Report Status 11/15/2015 FINAL  Final  Blood Culture (routine x 2)     Status: None   Collection Time: 11/10/15 10:50 AM  Result Value Ref Range Status   Specimen Description BLOOD RIGHT FOREARM  Final   Special Requests BOTTLES DRAWN AEROBIC AND ANAEROBIC 5CC  Final   Culture  Setup Time   Final    GRAM POSITIVE COCCI IN CLUSTERS ANAEROBIC BOTTLE ONLY CRITICAL RESULT CALLED TO, READ BACK BY AND VERIFIED WITH: Edythe Lynn AT 1149 11/11/15 BY L BENFIELD    Culture STAPHYLOCOCCUS SPECIES (COAGULASE NEGATIVE)  Final   Report Status 11/14/2015 FINAL  Final   Organism ID, Bacteria STAPHYLOCOCCUS SPECIES (COAGULASE NEGATIVE)  Final      Susceptibility   Staphylococcus species (coagulase  negative) - MIC*    CIPROFLOXACIN >=8 RESISTANT Resistant     ERYTHROMYCIN >=8 RESISTANT Resistant     GENTAMICIN <=0.5 SENSITIVE Sensitive     OXACILLIN >=4 RESISTANT Resistant     TETRACYCLINE 2 SENSITIVE Sensitive     VANCOMYCIN 2 SENSITIVE Sensitive     TRIMETH/SULFA 160 RESISTANT Resistant     CLINDAMYCIN <=0.25 RESISTANT Resistant     RIFAMPIN <=0.5 SENSITIVE Sensitive     Inducible Clindamycin POSITIVE Resistant     * STAPHYLOCOCCUS SPECIES (COAGULASE NEGATIVE)  Urine culture     Status: None   Collection Time: 11/10/15 10:50 AM  Result Value Ref Range Status   Specimen Description URINE, CATHETERIZED  Final   Special Requests NONE  Final   Culture   Final    >=100,000 COLONIES/mL ESCHERICHIA COLI 30,000 COLONIES/mL PSEUDOMONAS AERUGINOSA    Report Status 11/13/2015 FINAL  Final   Organism ID, Bacteria ESCHERICHIA COLI  Final   Organism ID, Bacteria PSEUDOMONAS AERUGINOSA  Final      Susceptibility   Escherichia coli - MIC*    AMPICILLIN 8 SENSITIVE Sensitive     CEFAZOLIN <=4 SENSITIVE Sensitive     CEFTRIAXONE <=1 SENSITIVE Sensitive     CIPROFLOXACIN >=4 RESISTANT Resistant     GENTAMICIN <=1 SENSITIVE Sensitive     IMIPENEM <=0.25 SENSITIVE Sensitive     NITROFURANTOIN <=16 SENSITIVE Sensitive  TRIMETH/SULFA <=20 SENSITIVE Sensitive     AMPICILLIN/SULBACTAM 4 SENSITIVE Sensitive     PIP/TAZO <=4 SENSITIVE Sensitive     * >=100,000 COLONIES/mL ESCHERICHIA COLI   Pseudomonas aeruginosa - MIC*    CEFTAZIDIME >=64 RESISTANT Resistant     CIPROFLOXACIN 1 SENSITIVE Sensitive     GENTAMICIN 4 SENSITIVE Sensitive     IMIPENEM >=16 RESISTANT Resistant     CEFEPIME >=64 RESISTANT Resistant     * 30,000 COLONIES/mL PSEUDOMONAS AERUGINOSA  Wound culture     Status: None   Collection Time: 11/10/15  1:40 PM  Result Value Ref Range Status   Specimen Description WOUND LEFT ANKLE  Final   Special Requests Normal  Final   Gram Stain   Final    NO WBC SEEN NO SQUAMOUS  EPITHELIAL CELLS SEEN NO ORGANISMS SEEN Performed at [facility]    Culture   Final    RARE METHICILLIN RESISTANT STAPHYLOCOCCUS AUREUS Note: RIFAMPIN AND GENTAMICIN SHOULD NOT BE USED AS SINGLE DRUGS FOR TREATMENT OF STAPH INFECTIONS. This organism DOES NOT demonstrate inducible Clindamycin resistance in vitro. Note: CRITICAL RESULT CALLED TO, READ BACK BY AND VERIFIED WITH: Barbee Cough @ 11:16 AM 11/14/15 BY DWEEKS Performed at [facility]    Report Status 11/14/2015 FINAL  Final   Organism ID, Bacteria METHICILLIN RESISTANT STAPHYLOCOCCUS AUREUS  Final      Susceptibility   Methicillin resistant staphylococcus aureus - MIC*    CLINDAMYCIN <=0.25 SENSITIVE Sensitive     ERYTHROMYCIN >=8 RESISTANT Resistant     GENTAMICIN <=0.5 SENSITIVE Sensitive     LEVOFLOXACIN >=8 RESISTANT Resistant     OXACILLIN >=4 RESISTANT Resistant     RIFAMPIN <=0.5 SENSITIVE Sensitive     TRIMETH/SULFA >=320 RESISTANT Resistant     VANCOMYCIN 1 SENSITIVE Sensitive     TETRACYCLINE <=1 SENSITIVE Sensitive     * RARE METHICILLIN RESISTANT STAPHYLOCOCCUS AUREUS  MRSA PCR Screening     Status: None   Collection Time: 11/10/15  6:30 PM  Result Value Ref Range Status   MRSA by PCR NEGATIVE NEGATIVE Final    Comment:        The GeneXpert MRSA Assay (FDA approved for NASAL specimens only), is one component of a comprehensive MRSA colonization surveillance program. It is not intended to diagnose MRSA infection nor to guide or monitor treatment for MRSA infections.   Anaerobic culture     Status: None   Collection Time: 11/11/15  7:11 PM  Result Value Ref Range Status   Specimen Description WOUND RIGHT ANKLE  Final   Special Requests PATIENT ON FOLLOWING ZOSYN  Final   Gram Stain   Final    FEW WBC PRESENT, PREDOMINANTLY PMN NO SQUAMOUS EPITHELIAL CELLS SEEN RARE GRAM POSITIVE COCCI IN PAIRS Performed at [facility]    Culture   Final    NO ANAEROBES ISOLATED Performed at  [facility]    Report Status 11/18/2015 FINAL  Final  Wound culture     Status: None   Collection Time: 11/11/15  7:11 PM  Result Value Ref Range Status   Specimen Description WOUND RIGHT ANKLE  Final   Special Requests PATIENT ON FOLLOWING ZOSYN  Final   Gram Stain   Final    FEW WBC PRESENT, PREDOMINANTLY PMN NO SQUAMOUS EPITHELIAL CELLS SEEN RARE GRAM POSITIVE COCCI IN PAIRS Performed at [facility]    Culture   Final    MODERATE GROUP B STREP(S.AGALACTIAE)ISOLATED Note: Beta  hemolytic streptococci are predictably susceptible to penicillin and other beta lactams. Susceptibility testing not routinely performed. Performed at [facility]    Report Status 11/15/2015 FINAL  Final  Culture, blood (routine x 2)     Status: None   Collection Time: 11/12/15  7:33 PM  Result Value Ref Range Status   Specimen Description BLOOD RIGHT WRIST  Final   Special Requests BOTTLES DRAWN AEROBIC AND ANAEROBIC 10CC  Final   Culture NO GROWTH 5 DAYS  Final   Report Status 11/17/2015 FINAL  Final  Culture, blood (routine x 2)     Status: None   Collection Time: 11/12/15  7:42 PM  Result Value Ref Range Status   Specimen Description BLOOD RIGHT ANTECUBITAL  Final   Special Requests BOTTLES DRAWN AEROBIC AND ANAEROBIC 10CC  Final   Culture NO GROWTH 5 DAYS  Final   Report Status 11/17/2015 FINAL  Final  Blood Culture (routine x 2)     Status: None (Preliminary result)   Collection Time: 11/26/15  6:00 AM  Result Value Ref Range Status   Specimen Description BLOOD RIGHT ARM  Final   Special Requests BOTTLES DRAWN AEROBIC AND ANAEROBIC 10ML  Final   Culture NO GROWTH 4 DAYS  Final   Report Status PENDING  Incomplete  Blood Culture (routine x 2)     Status: None (Preliminary result)   Collection Time: 11/26/15  6:00 AM  Result Value Ref Range Status   Specimen Description BLOOD LEFT HAND  Final   Special Requests IN PEDIATRIC BOTTLE 3ML  Final   Culture NO GROWTH 4 DAYS   Final   Report Status PENDING  Incomplete  C difficile quick scan w PCR reflex     Status: None   Collection Time: 11/26/15  7:07 AM  Result Value Ref Range Status   C Diff antigen NEGATIVE NEGATIVE Final   C Diff toxin NEGATIVE NEGATIVE Final   C Diff interpretation Negative for toxigenic C. difficile  Final  Urine culture     Status: None   Collection Time: 11/26/15  7:46 AM  Result Value Ref Range Status   Specimen Description URINE, RANDOM  Final   Special Requests NONE  Final   Culture   Final    >=100,000 COLONIES/mL PROTEUS MIRABILIS >=100,000 COLONIES/mL ESCHERICHIA COLI    Report Status 11/29/2015 FINAL  Final   Organism ID, Bacteria PROTEUS MIRABILIS  Final   Organism ID, Bacteria ESCHERICHIA COLI  Final      Susceptibility   Escherichia coli - MIC*    AMPICILLIN 8 SENSITIVE Sensitive     CEFAZOLIN <=4 SENSITIVE Sensitive     CEFTRIAXONE <=1 SENSITIVE Sensitive     CIPROFLOXACIN >=4 RESISTANT Resistant     GENTAMICIN <=1 SENSITIVE Sensitive     IMIPENEM <=0.25 SENSITIVE Sensitive     NITROFURANTOIN <=16 SENSITIVE Sensitive     TRIMETH/SULFA <=20 SENSITIVE Sensitive     AMPICILLIN/SULBACTAM 4 SENSITIVE Sensitive     PIP/TAZO <=4 SENSITIVE Sensitive     * >=100,000 COLONIES/mL ESCHERICHIA COLI   Proteus mirabilis - MIC*    AMPICILLIN >=32 RESISTANT Resistant     CEFAZOLIN 8 SENSITIVE Sensitive     CEFTRIAXONE <=1 SENSITIVE Sensitive     CIPROFLOXACIN >=4 RESISTANT Resistant     GENTAMICIN >=16 RESISTANT Resistant     IMIPENEM 4 SENSITIVE Sensitive     NITROFURANTOIN 128 RESISTANT Resistant     TRIMETH/SULFA >=320 RESISTANT Resistant     AMPICILLIN/SULBACTAM >=32  RESISTANT Resistant     PIP/TAZO <=4 SENSITIVE Sensitive     * >=100,000 COLONIES/mL PROTEUS MIRABILIS  MRSA PCR Screening     Status: None   Collection Time: 11/26/15  9:41 AM  Result Value Ref Range Status   MRSA by PCR NEGATIVE NEGATIVE Final    Comment:        The GeneXpert MRSA Assay  (FDA approved for NASAL specimens only), is one component of a comprehensive MRSA colonization surveillance program. It is not intended to diagnose MRSA infection nor to guide or monitor treatment for MRSA infections.     Medical History: Past Medical History  Diagnosis Date  . DJD (degenerative joint disease)   . Seizures ([place])   . Broken hip ([place])   . Cervical spinal cord injury ([place])   . Tobacco abuse    Assessment: Infectious Disease: Codes sepsis called - likely source purulent R ankle ulcer. Ortho consulted and may need surgery. Has also had urinary retention and abdominal pain despite foley cath. Pt with recently here 12/1-12/6 - treated with IV abx then discharged on po doxycycline for R ankle infection Vanc 1 g at 0600 on 12/17  WBC normal at 7, LA 2.17 > 1.2, Procalcitonin 12.37 in the ED. Tm 101, D#4 of abx.   Patient now has normal wbc count, still with intermittent fevers and significant eosinophilia on diff, will rule out allergic reaction to ceftriaxone and switch to septra.  Vanc 12/1 >> 12/6, 12/17>>12/19 Zosyn 12/1 >> 12/6, 12/17>> 12/19 Ceftriaxone 12/19>>12/21 (questionable drug allergy) Septra 12/21>>  12/3 VT = 12 (drawn 1h early)  12/18 VR <4, renal fxn improved  12/17 CDiff: antigen and toxin negative 12/17 urine: Ecoli, proteus mirabilis - sensitive to cefazolin, ctx, zosyn, septra 12/17 BCx: ngtd 12/17 Cdiff: neg  12/1 L ankle wound > MRSA (S-vanc, clinda, rifampin, tetra, gent)  12/1 blood x 2 > 1/2 CoNS (S-gent,rifampin, tetra, vanc)  12/1 urine > Ecoli (pan-S) + pseudomonas (30k col, likely colonization with catheter - S-cipro)  12/1 MRSA screen neg  12/2 R ankle wound >>  12/3 blood x 2>>ngtd   Goal of Therapy:  Eradication of infection  Plan:  Stop ceftriaxone with possible drug allergy Septra 15mg /kg/day divided q8 hours Follow up possible change to po in am  Erin Hearing PharmD., BCPS Clinical Pharmacist Pager  [phone] 11/30/2015 4:27 PM

## 2015-11-30 NOTE — Progress Notes (Signed)
Right foot incision ok Dc sutures Continue heel vac for 10 more days

## 2015-11-30 NOTE — Progress Notes (Signed)
Subjective:   Day of hospitalization: 4  IMTS to resume care today of Phillip [name] via transfer from hospitalist.    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  He underwent debridement and hardware removal on 12/2 with a placement of a wound vac.  Wound cultures grew MRSA and was placed on vancomycin for a 6 day course and transitioned to doxycycline on day of discharge for a 4 day course.  Also received 1 unit of prbcs for acute blood loss after  surgery.  Hgb prior to surgery was 9.0 which trended down to 6.5 post surgery.  Also has chronic indwelling foley catheter with urine cx that grew e.coli sensitive to zosyn and was treated for a total of 6 days although he denied symptoms of dysuria.  Also had multiple stage 2 pressure ulcers on admission.    He was readmitted on 12/17 by PCCM for sepsis due to UTI and intubated for airway protection.  Labs on admission were noted for a leukocytosis of 23,000, SCr 4.1, and lactic acid of 2.1.  He was initially on zosyn started on 12/17 and discontinued on 12/19 and was started on ceftriaxone on 12/19 with resolution of leukocytosis.  However, hgb has trended down to 7.0 and GI consulted for a positive FOBT.  Dr. Oletta Lamas was scheduled to do an EGD yesterday but was postponed due to fevers.  Labs today are pending.  Appears that his baseline hgb is around 8-9 since November 2015 with anemia panel on 08/06/15 showing low iron and TIBC.  Ferritin was high at 552.  He was taking ferrous sulfate for a home med.   He feels good today without complaints, although he mentions that he has difficulty sleeping.  He was febrile yesterday to 101, otherwise, VSS.      Objective:   Vital signs in last 24 hours: Filed Vitals:   11/29/15 1325 11/29/15 2125  11/30/15 0548 11/30/15 1111  BP: 106/64 116/68 106/60 119/71  Pulse: 96 107 97 105  Temp: 99.2 F (37.3 C) 100.7 F (38.2 C) 100.6 F (38.1 C)   TempSrc: Oral Oral Oral   Resp: 18 20 18    Height:      Weight:   69.2 kg (152 lb 8.9 oz)   SpO2: 100% 100% 100%     Weight: Filed Weights   11/28/15 1350 11/29/15 0542 11/30/15 0548  Weight: 68.4 kg (150 lb 12.7 oz) 68.7 kg (151 lb 7.3 oz) 69.2 kg (152 lb 8.9 oz)    I/Os:  Intake/Output Summary (Last 24 hours) at 11/30/15 1244 Last data filed at 11/30/15 1121  Gross per 24 hour  Intake 1869.17 ml  Output   4300 ml  Net -2430.83 ml    Physical Exam: Constitutional: Vital signs reviewed.  Patient is lying in bed in no acute distress and cooperative with exam.   HEENT: /AT, EOMI, conjunctivae normal, no scleral icterus  Cardiovascular: RRR, no MRG Pulmonary/Chest: normal respiratory effort, no accessory muscle use, CTAB, no wheezes, rales, or rhonchi Abdominal: Soft. +BS, NT/ND Neurological: A&O x3, CN II-XII grossly intact; non-focal exam Extremities: Bandaged.   Skin: Warm, dry and intact. Blister on R hand.   Lab Results:  BMP:  Recent Labs Lab 11/28/15 0420 11/28/15 1945 11/29/15 0620 11/30/15 0958  NA  141  --  138 142  K 3.2*  --  3.5 3.6  CL 110  --  107 108  CO2 23  --  22 23  GLUCOSE 89  --  91 122*  BUN 6  --  <5* <5*  CREATININE 0.58*  --  0.42* 0.51*  CALCIUM 8.7*  --  9.2 8.6*  MG 1.5* 2.0 1.7  --   PHOS 2.5  --  3.7  --     CBC:  Recent Labs Lab 11/26/15 0600  11/29/15 0620 11/30/15 0958  WBC 23.0*  < > 6.7 7.5  NEUTROABS 18.5*  --   --  3.3  HGB 10.0*  < > 7.8* 8.2*  HCT 32.6*  < > 25.3* 25.3*  MCV 87.9  < > 86.3 85.2  PLT 458*  < > 324 366  < > = values in this interval not displayed.  Coagulation: No results for input(s): LABPROT, INR in the last 168 hours.  CBG:            Recent Labs Lab 11/26/15 0524 11/26/15 1011  GLUCAP 124* 141*           HA1C:      No results for  input(s): HGBA1C in the last 168 hours.  Lipid Panel: No results for input(s): CHOL, HDL, LDLCALC, TRIG, CHOLHDL, LDLDIRECT in the last 168 hours.  LFTs:  Recent Labs Lab 11/26/15 0600  AST 18  ALT 12*  ALKPHOS 140*  BILITOT 0.7  PROT 7.8  ALBUMIN 3.1*    Pancreatic Enzymes: No results for input(s): LIPASE, AMYLASE in the last 168 hours.  Lactic Acid/Procalcitonin:  Recent Labs Lab 11/26/15 0600  11/26/15 1105 11/26/15 1822 11/26/15 2107  LATICACIDVEN  --   < > 2.4* 1.0 1.2  PROCALCITON 12.37  --   --   --   --   < > = values in this interval not displayed.  Ammonia: No results for input(s): AMMONIA in the last 168 hours.  Cardiac Enzymes: No results for input(s): CKTOTAL, CKMB, CKMBINDEX, TROPONINI in the last 168 hours.  EKG: EKG Interpretation  Date/Time:  Saturday November 26 2015 05:29:42 EST Ventricular Rate:  118 PR Interval:  129 QRS Duration: 83 QT Interval:  308 QTC Calculation: 431 R Axis:   -13 Text Interpretation:  Sinus tachycardia RSR' in V1 or V2, right VCD or RVH No significant change since last tracing Confirmed by WARD,  DO, KRISTEN ([id number]) on 11/26/2015 6:00:55 AM   BNP: No results for input(s): PROBNP in the last 168 hours.  D-Dimer: No results for input(s): DDIMER in the last 168 hours.  Urinalysis:  Recent Labs Lab 11/26/15 0746  COLORURINE RED*  LABSPEC 1.018  PHURINE 6.5  GLUCOSEU NEGATIVE  HGBUR LARGE*  BILIRUBINUR LARGE*  KETONESUR 15*  PROTEINUR >300*  NITRITE POSITIVE*  LEUKOCYTESUR LARGE*    Micro Results: Recent Results (from the past 240 hour(s))  Blood Culture (routine x 2)     Status: None (Preliminary result)   Collection Time: 11/26/15  6:00 AM  Result Value Ref Range Status   Specimen Description BLOOD RIGHT ARM  Final   Special Requests BOTTLES DRAWN AEROBIC AND ANAEROBIC 10ML  Final   Culture NO GROWTH 4 DAYS  Final   Report Status PENDING  Incomplete  Blood Culture (routine x 2)     Status:  None (Preliminary result)   Collection Time: 11/26/15  6:00 AM  Result Value Ref Range Status   Specimen Description BLOOD  LEFT HAND  Final   Special Requests IN PEDIATRIC BOTTLE 3ML  Final   Culture NO GROWTH 4 DAYS  Final   Report Status PENDING  Incomplete  C difficile quick scan w PCR reflex     Status: None   Collection Time: 11/26/15  7:07 AM  Result Value Ref Range Status   C Diff antigen NEGATIVE NEGATIVE Final   C Diff toxin NEGATIVE NEGATIVE Final   C Diff interpretation Negative for toxigenic C. difficile  Final  Urine culture     Status: None   Collection Time: 11/26/15  7:46 AM  Result Value Ref Range Status   Specimen Description URINE, RANDOM  Final   Special Requests NONE  Final   Culture   Final    >=100,000 COLONIES/mL PROTEUS MIRABILIS >=100,000 COLONIES/mL ESCHERICHIA COLI    Report Status 11/29/2015 FINAL  Final   Organism ID, Bacteria PROTEUS MIRABILIS  Final   Organism ID, Bacteria ESCHERICHIA COLI  Final      Susceptibility   Escherichia coli - MIC*    AMPICILLIN 8 SENSITIVE Sensitive     CEFAZOLIN <=4 SENSITIVE Sensitive     CEFTRIAXONE <=1 SENSITIVE Sensitive     CIPROFLOXACIN >=4 RESISTANT Resistant     GENTAMICIN <=1 SENSITIVE Sensitive     IMIPENEM <=0.25 SENSITIVE Sensitive     NITROFURANTOIN <=16 SENSITIVE Sensitive     TRIMETH/SULFA <=20 SENSITIVE Sensitive     AMPICILLIN/SULBACTAM 4 SENSITIVE Sensitive     PIP/TAZO <=4 SENSITIVE Sensitive     * >=100,000 COLONIES/mL ESCHERICHIA COLI   Proteus mirabilis - MIC*    AMPICILLIN >=32 RESISTANT Resistant     CEFAZOLIN 8 SENSITIVE Sensitive     CEFTRIAXONE <=1 SENSITIVE Sensitive     CIPROFLOXACIN >=4 RESISTANT Resistant     GENTAMICIN >=16 RESISTANT Resistant     IMIPENEM 4 SENSITIVE Sensitive     NITROFURANTOIN 128 RESISTANT Resistant     TRIMETH/SULFA >=320 RESISTANT Resistant     AMPICILLIN/SULBACTAM >=32 RESISTANT Resistant     PIP/TAZO <=4 SENSITIVE Sensitive     * >=100,000 COLONIES/mL  PROTEUS MIRABILIS  MRSA PCR Screening     Status: None   Collection Time: 11/26/15  9:41 AM  Result Value Ref Range Status   MRSA by PCR NEGATIVE NEGATIVE Final    Comment:        The GeneXpert MRSA Assay (FDA approved for NASAL specimens only), is one component of a comprehensive MRSA colonization surveillance program. It is not intended to diagnose MRSA infection nor to guide or monitor treatment for MRSA infections.     Blood Culture:    Component Value Date/Time   SDES URINE, RANDOM 11/26/2015 0746   SPECREQUEST NONE 11/26/2015 0746   CULT  11/26/2015 0746    >=100,000 COLONIES/mL PROTEUS MIRABILIS >=100,000 COLONIES/mL ESCHERICHIA COLI    REPTSTATUS 11/29/2015 FINAL 11/26/2015 0746    Studies/Results: No results found.  Medications:  Scheduled Meds: . amLODipine  5 mg Oral Daily  . baclofen  10 mg Oral TID  . cefTRIAXone (ROCEPHIN)  IV  2 g Intravenous Q24H  . Chlorhexidine Gluconate Cloth  6 each Topical Q0600  . ferrous sulfate  325 mg Oral Q breakfast  . gabapentin  300 mg Oral TID  . Influenza vac split quadrivalent PF  0.5 mL Intramuscular Tomorrow-1000  . metoprolol tartrate  12.5 mg Oral BID  . pantoprazole  40 mg Oral BID   Continuous Infusions:   PRN Meds: acetaminophen  Antibiotics: Antibiotics  Given (last 72 hours)    Date/Time Action Medication Dose Rate   11/27/15 1355 Given   piperacillin-tazobactam (ZOSYN) IVPB 3.375 g 3.375 g 12.5 mL/hr   11/27/15 1757 Given   vancomycin (VANCOCIN) IVPB 750 mg/150 ml premix 750 mg 150 mL/hr   11/27/15 2200 Given   piperacillin-tazobactam (ZOSYN) IVPB 3.375 g 3.375 g 12.5 mL/hr   11/28/15 0526 Given   piperacillin-tazobactam (ZOSYN) IVPB 3.375 g 3.375 g 12.5 mL/hr   11/28/15 0526 Given   vancomycin (VANCOCIN) IVPB 750 mg/150 ml premix 750 mg 150 mL/hr   11/28/15 1300 Given   cefTRIAXone (ROCEPHIN) 1 g in dextrose 5 % 50 mL IVPB 1 g 100 mL/hr   11/29/15 1340 Given   cefTRIAXone (ROCEPHIN) 1 g in  dextrose 5 % 50 mL IVPB 1 g 100 mL/hr      Day of Hospitalization: 4  Consults: Treatment Team:  Phillip Spates, MD  Assessment/Plan:   Principal Problem:   Complicated UTI (urinary tract infection) Active Problems:   Cervical spinal cord injury ([place])   Chronic anemia   Spinal cord injury at C5-C7 level without injury of spinal bone ([place])   Sepsis ([place])   Obstructive uropathy  Severe sepsis due to UTI Has chronic indwelling Foley catheter.  Febrile overnight to 100.7.  Urine cx growing e.coli and proteus sensitive to rocephin.   -cont rocephin for now, with transition to oral abx   Right hand blister Consider drug rx given fevers, blister, and eosinophilia.  Anemia  Likely exacerbated by acute illness.  Anemia panel on 8/27 16 with low iron and TIBC.  Ferritin was high at 552.  He was started on ferrous sulfate.  Hgb since 2015 has been in the 8-9 range.  GI following and EGD planned yesterday but was postponed due to fevers.  Spoke with GI today (Dr. Oletta Lamas) and feels that EGD can be postponed for now given stable hgb.   -monitor hgb -will check bili, LDH, retic count  -repeat FOBT per GI -appreciate GI following  -cont PPI bid   Acute hypoxic respiratory failure Resolved.  Intubated due to AMS on 12/17, extubated on 12/18.  Now on RA and doing well.    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  Wound care and ortho following.   -cont wound care -ortho following, cont heel vac for 10 additional days   Obstructive uropathy Resolved, secondary to clogged foley.  Foley replaced on admission. Renal US negative for hydronephrosis.    History of quadriplegia following a recent cervical spine injury (June 2016)  F/E/N Fluids- None  Electrolytes- Replete as needed  Nutrition- Regular   VTE PPx  SCDs   Disposition Disposition is deferred, awaiting  improvement of current medical problems.  Anticipated discharge in approximately 1-2 day(s) to SNF.      LOS: 4 days   Jones Bales, MD PGY-3, Internal Medicine Teaching Service 11/30/2015, 12:44 PM
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
## 2015-11-30 NOTE — Progress Notes (Signed)
[facility] PROGRESS NOTE Subjective Pt w/o chills feels ok  Objective: Vital signs in last 24 hours: Temp:  [99.2 F (37.3 C)-101 F (38.3 C)] 100.6 F (38.1 C) (12/21 0548) Pulse Rate:  [96-107] 97 (12/21 0548) Resp:  [18-26] 18 (12/21 0548) BP: (106-129)/(60-70) 106/60 mmHg (12/21 0548) SpO2:  [99 %-100 %] 100 % (12/21 0548) Weight:  [69.2 kg (152 lb 8.9 oz)] 69.2 kg (152 lb 8.9 oz) (12/21 0548) Last BM Date: 11/28/15  Intake/Output from previous day: 12/20 0701 - 12/21 0700 In: 1389.2 [P.O.:240; I.V.:1149.2] Out: 3600 [Urine:3600] Intake/Output this shift:    PE: General-- temps 100.4+ no chills  Lab Results:  Recent Labs  11/28/15 1945 11/29/15 0620  WBC 7.7 6.7  HGB 7.0* 7.8*  HCT 22.7* 25.3*  PLT 328 324   BMET  Recent Labs  11/28/15 0420 11/29/15 0620  NA 141 138  K 3.2* 3.5  CL 110 107  CO2 23 22  CREATININE 0.58* 0.42*   LFT No results for input(s): PROT, AST, ALT, ALKPHOS, BILITOT, BILIDIR, IBILI in the last 72 hours. PT/INR No results for input(s): LABPROT, INR in the last 72 hours. PANCREAS No results for input(s): LIPASE in the last 72 hours.       Studies/Results: No results found.  Medications: I have reviewed the patient's current medications.  Assessment/Plan: 1. Anemia. Discussed with resident, Hg coming up still with low grade fever will defer  EGD for now and moniter stools and Hg continue the PPI.   Parnell Spieler JR,Gwendolyne Welford L 11/30/2015, 8:19 AM  Pager: [phone] If no answer or after hours call [phone]

## 2015-11-30 NOTE — Clinical Social Work Note (Signed)
Clinical Social Work Assessment  Patient Details  Name: Phillip [name] MRN: [id number] Date of Birth: 16-Dec-1965  Date of referral:  11/29/15               Reason for consult:  Facility Placement                Permission sought to share information with:  Facility Sport and exercise psychologist, Family Supports Permission granted to share information::  Yes, Hospital doctor (Patient disoriented; completed assessment w/ sister, Phillip [name])  Name::     [place]::  [facility] (long term resident)  Relationship::  Sister  Contact Information:  [phone]  Housing/Transportation Living arrangements for the past 2 months:  [place] of Information:  Other (Comment Required), Patient (Sister) Patient Interpreter Needed:  None Criminal Activity/Legal Involvement Pertinent to Current Situation/Hospitalization:  No - Comment as needed Significant Relationships:  Other Family Members Lives with:  Self (Facility resident) Do you feel safe going back to the place where you live?  Yes Need for family participation in patient care:  No (Coment)  Care giving concerns:  CSW received referral for possible SNF placement at time of discharge. CSW spoke w/ patient and patient's sister, Phillip [name], regarding PT recommendation of SNF placement at time of discharge. Patient's sister expressed understanding of PT recommendation and is agreeable to SNF placement at time of discharge. CSW to continue to follow and assist with discharge planning needs.   Social Worker assessment / plan:  CSW spoke with patient concerning possibility of rehab at [facility] before returning home.  Employment status:  Disabled (Comment on whether or not currently receiving Disability) Insurance information:  Medicaid In [place] PT Recommendations:  [place] / Referral to community resources:  [place]  Patient/Family's Response to care:  Patient recognizes need  for rehab before returning home and is agreeable to returning to [facility].  Patient/Family's Understanding of and Emotional Response to Diagnosis, Current Treatment, and Prognosis:  Patient is realistic regarding therapy needs. No questions/concerns about plan or treatment.    Emotional Assessment Appearance:  Appears stated age Attitude/Demeanor/Rapport:  Lethargic Affect (typically observed):  Accepting, Appropriate, Quiet Orientation:  Oriented to Self, Oriented to Place, Oriented to  Time, Oriented to Situation Alcohol / Substance use:  Not Applicable Psych involvement (Current and /or in the community):  No (Comment)  Discharge Needs  Concerns to be addressed:  No discharge needs identified Readmission within the last 30 days:  No Current discharge risk:  None Barriers to Discharge:  No Barriers Identified   [name], [place] 11/30/2015, 11:40 AM

## 2015-12-01 DIAGNOSIS — D649 Anemia, unspecified: Secondary | ICD-10-CM

## 2015-12-01 LAB — CULTURE, BLOOD (ROUTINE X 2)
CULTURE: NO GROWTH
Culture: NO GROWTH

## 2015-12-01 LAB — COMPREHENSIVE METABOLIC PANEL
ALK PHOS: 113 U/L (ref 38–126)
ALT: 17 U/L (ref 17–63)
ANION GAP: 10 (ref 5–15)
AST: 13 U/L — ABNORMAL LOW (ref 15–41)
Albumin: 2.3 g/dL — ABNORMAL LOW (ref 3.5–5.0)
BILIRUBIN TOTAL: 0.3 mg/dL (ref 0.3–1.2)
BUN: <5 mg/dL — ABNORMAL LOW (ref 6–20)
CALCIUM: 8.7 mg/dL — AB (ref 8.9–10.3)
CO2: 25 mmol/L (ref 22–32)
Chloride: 103 mmol/L (ref 101–111)
Creatinine, Ser: 0.57 mg/dL — ABNORMAL LOW (ref 0.61–1.24)
GLUCOSE: 126 mg/dL — AB (ref 65–99)
POTASSIUM: 3.5 mmol/L (ref 3.5–5.1)
Sodium: 138 mmol/L (ref 135–145)
TOTAL PROTEIN: 6.3 g/dL — AB (ref 6.5–8.1)

## 2015-12-01 MED ORDER — ADULT MULTIVITAMIN W/MINERALS CH
1.0000 | ORAL_TABLET | Freq: Every day | ORAL | Status: DC
  Administered 2015-12-01 – 2015-12-02 (×2): 1 via ORAL
  Filled 2015-12-01 (×2): qty 1

## 2015-12-01 MED ORDER — VITAMIN B-12 1000 MCG PO TABS
1000.0000 ug | ORAL_TABLET | Freq: Every day | ORAL | Status: DC
  Administered 2015-12-01 – 2015-12-02 (×2): 1000 ug via ORAL
  Filled 2015-12-01 (×2): qty 1

## 2015-12-01 MED ORDER — POLYETHYLENE GLYCOL 3350 17 G PO PACK: 17.0000 g | PACK | Freq: Every day | ORAL | Status: DC | PRN

## 2015-12-01 MED ORDER — POTASSIUM CHLORIDE CRYS ER 20 MEQ PO TBCR
40.0000 meq | EXTENDED_RELEASE_TABLET | Freq: Once | ORAL | Status: AC
  Administered 2015-12-01: 40 meq via ORAL
  Filled 2015-12-01: qty 2

## 2015-12-01 MED ORDER — HYDROCODONE-ACETAMINOPHEN 7.5-325 MG PO TABS: 1.0000 | ORAL_TABLET | ORAL | Status: DC | PRN

## 2015-12-01 MED ORDER — ZINC SULFATE 220 (50 ZN) MG PO CAPS
220.0000 mg | ORAL_CAPSULE | Freq: Every day | ORAL | Status: DC
  Administered 2015-12-01 – 2015-12-02 (×2): 220 mg via ORAL
  Filled 2015-12-01 (×2): qty 1

## 2015-12-01 MED ORDER — ENSURE ENLIVE PO LIQD
237.0000 mL | Freq: Three times a day (TID) | ORAL | Status: DC
  Administered 2015-12-01 (×2): 237 mL via ORAL

## 2015-12-01 MED ORDER — POLYETHYLENE GLYCOL 3350 17 G PO PACK: 17.0000 g | PACK | Freq: Every day | ORAL | Status: DC

## 2015-12-01 MED ORDER — AMITRIPTYLINE HCL 50 MG PO TABS
25.0000 mg | ORAL_TABLET | Freq: Every day | ORAL | Status: DC
  Administered 2015-12-01: 25 mg via ORAL
  Filled 2015-12-01: qty 1

## 2015-12-01 MED ORDER — VITAMIN C 500 MG PO TABS
500.0000 mg | ORAL_TABLET | Freq: Two times a day (BID) | ORAL | Status: DC
  Administered 2015-12-01 – 2015-12-02 (×3): 500 mg via ORAL
  Filled 2015-12-01 (×3): qty 1

## 2015-12-01 MED ORDER — DICLOFENAC SODIUM 1 % TD GEL
2.0000 g | Freq: Three times a day (TID) | TRANSDERMAL | Status: DC
  Administered 2015-12-01 (×2): 2 g via TOPICAL
  Filled 2015-12-01: qty 100

## 2015-12-01 MED ORDER — ZOLPIDEM TARTRATE 5 MG PO TABS
5.0000 mg | ORAL_TABLET | Freq: Every evening | ORAL | Status: DC | PRN
  Administered 2015-12-01: 5 mg via ORAL
  Filled 2015-12-01: qty 1

## 2015-12-01 NOTE — Progress Notes (Signed)
No further bleeding Hg coming up we will be available if is felt further GI work up needed.
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
## 2015-12-02 DIAGNOSIS — L8990 Pressure ulcer of unspecified site, unspecified stage: Secondary | ICD-10-CM

## 2015-12-02 DIAGNOSIS — R000 Tachycardia, unspecified: Secondary | ICD-10-CM

## 2015-12-02 DIAGNOSIS — I10 Essential (primary) hypertension: Secondary | ICD-10-CM

## 2015-12-02 LAB — CBC
HEMATOCRIT: 25.2 % — AB (ref 39.0–52.0)
HEMOGLOBIN: 7.8 g/dL — AB (ref 13.0–17.0)
MCH: 26.8 pg (ref 26.0–34.0)
MCHC: 31 g/dL (ref 30.0–36.0)
MCV: 86.6 fL (ref 78.0–100.0)
Platelets: 423 10*3/uL — ABNORMAL HIGH (ref 150–400)
RBC: 2.91 MIL/uL — ABNORMAL LOW (ref 4.22–5.81)
RDW: 13.9 % (ref 11.5–15.5)
WBC: 8.1 10*3/uL (ref 4.0–10.5)

## 2015-12-02 LAB — BASIC METABOLIC PANEL
Anion gap: 8 (ref 5–15)
CHLORIDE: 105 mmol/L (ref 101–111)
CO2: 25 mmol/L (ref 22–32)
CREATININE: 0.51 mg/dL — AB (ref 0.61–1.24)
Calcium: 8.8 mg/dL — ABNORMAL LOW (ref 8.9–10.3)
GFR calc Af Amer: >60 mL/min (ref >60)
GFR calc non Af Amer: >60 mL/min (ref >60)
GLUCOSE: 147 mg/dL — AB (ref 65–99)
Potassium: 3.9 mmol/L (ref 3.5–5.1)
Sodium: 138 mmol/L (ref 135–145)

## 2015-12-02 MED ORDER — AMLODIPINE BESYLATE 5 MG PO TABS: 5.0000 mg | ORAL_TABLET | Freq: Every day | ORAL | Status: DC

## 2015-12-02 MED ORDER — SULFAMETHOXAZOLE-TRIMETHOPRIM 800-160 MG PO TABS: 1.0000 | ORAL_TABLET | Freq: Two times a day (BID) | ORAL | Status: AC

## 2015-12-02 MED ORDER — ENOXAPARIN SODIUM 40 MG/0.4ML ~~LOC~~ SOLN
40.0000 mg | SUBCUTANEOUS | Status: DC
  Administered 2015-12-02: 40 mg via SUBCUTANEOUS
  Filled 2015-12-02: qty 0.4

## 2015-12-02 MED ORDER — PANTOPRAZOLE SODIUM 40 MG PO TBEC: 40.0000 mg | DELAYED_RELEASE_TABLET | Freq: Every day | ORAL | Status: AC

## 2015-12-02 MED ORDER — SULFAMETHOXAZOLE-TRIMETHOPRIM 800-160 MG PO TABS
1.0000 | ORAL_TABLET | Freq: Two times a day (BID) | ORAL | Status: DC
  Administered 2015-12-02: 1 via ORAL
  Filled 2015-12-02: qty 1

## 2015-12-02 NOTE — Progress Notes (Signed)
Patient was discharged to nursing home [facility]) by MD order; discharged instructions review and sent to facility with care notes; IV DIC; wound vac in place; facility was called and report was given to nurse Chrys Racer; patient will be transported to facility via EMS.
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
## 2015-12-02 NOTE — Care Management Note (Signed)
Case Management Note  Patient Details  Name: Phillip [name] MRN: [id number] Date of Birth: 1966-08-15  Subjective/Objective:       Patient is for dc back to [facility] with wound vac from [facility] which is in a bag in patient's room.  RN informed of this information.              Action/Plan:   Expected Discharge Date:                  Expected Discharge Plan:  Skilled Nursing Facility  In-House Referral:  Clinical Social Work  Discharge planning Services  CM Consult  Post Acute Care Choice:    Choice offered to:     DME Arranged:    DME Agency:     HH Arranged:    [place] Agency:     Status of Service:  Completed, signed off  Medicare Important Message Given:    Date Medicare IM Given:    Medicare IM give by:    Date Additional Medicare IM Given:    Additional Medicare Important Message give by:     If discussed at [place] [facility] of Stay Meetings, dates discussed:    Additional Comments:  Zenon Mayo, RN 12/02/2015, 11:12 AM

## 2015-12-02 NOTE — Progress Notes (Signed)
Patient will DC to: [facility] date: 12/02/15 Family notified: Sister, [name] Transport by: PTAR 3:15pm  CSW signing off.  Cedric Fishman, [place] Social Worker [phone]
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
## 2015-12-02 NOTE — Progress Notes (Signed)
Pharmacist Provided - Patient Medication Education Prior to Discharge   JAMIR VANDERHAM is an 49 y.o. male who presented to [facility] on 11/26/2015 with a chief complaint of  Chief Complaint  Patient presents with  . Code Sepsis     [x]  Patient will be discharged with 1 new medication  The following medications were discussed with the patient: Bactrim  Antibiotics at discharge: [x]  Yes    []  No  Allergy Assessment Completed and Updated: [x]  Yes    []  No Identified Patient Allergies: No Known Allergies   Barriers to Obtaining Medications: []  Yes [x]  No  Assessment: Mr. Acocella is a 64yoM admitted 11/26/2015 with sepsis secondary to UTI. He was educated on Bactrim and the importance of taking all of his doses. He was encouraged to check his temperature at home and monitor for any fever. He doses not express any concerns with this medication.  Time spent preparing for discharge counseling: 25 minutes Time spent counseling patient: 15 minutes  Dimitri Ped, PharmD. PGY-1 Pharmacy Resident Pager: [phone]  12/02/2015, 11:55 AM
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
## 2015-12-24 ENCOUNTER — Encounter (HOSPITAL_COMMUNITY): Payer: Self-pay | Admitting: Emergency Medicine

## 2015-12-24 ENCOUNTER — Emergency Department (HOSPITAL_COMMUNITY): Payer: Medicaid Other

## 2015-12-24 ENCOUNTER — Inpatient Hospital Stay (HOSPITAL_COMMUNITY): Payer: Medicaid Other

## 2015-12-24 ENCOUNTER — Inpatient Hospital Stay (HOSPITAL_COMMUNITY)
Admission: EM | Admit: 2015-12-24 | Discharge: 2015-12-29 | DRG: 698 | Disposition: A | Discharge status: Skilled Nursing Facility | Payer: Medicaid Other | Attending: Internal Medicine | Admitting: Internal Medicine

## 2015-12-24 DIAGNOSIS — B390 Acute pulmonary histoplasmosis capsulati: Secondary | ICD-10-CM | POA: Diagnosis not present

## 2015-12-24 DIAGNOSIS — G8250 Quadriplegia, unspecified: Secondary | ICD-10-CM | POA: Diagnosis present

## 2015-12-24 DIAGNOSIS — R509 Fever, unspecified: Secondary | ICD-10-CM | POA: Diagnosis present

## 2015-12-24 DIAGNOSIS — A4159 Other Gram-negative sepsis: Secondary | ICD-10-CM | POA: Diagnosis present

## 2015-12-24 DIAGNOSIS — Z8614 Personal history of Methicillin resistant Staphylococcus aureus infection: Secondary | ICD-10-CM | POA: Diagnosis not present

## 2015-12-24 DIAGNOSIS — I10 Essential (primary) hypertension: Secondary | ICD-10-CM | POA: Diagnosis present

## 2015-12-24 DIAGNOSIS — J9601 Acute respiratory failure with hypoxia: Secondary | ICD-10-CM | POA: Diagnosis present

## 2015-12-24 DIAGNOSIS — B964 Proteus (mirabilis) (morganii) as the cause of diseases classified elsewhere: Secondary | ICD-10-CM | POA: Diagnosis present

## 2015-12-24 DIAGNOSIS — L89150 Pressure ulcer of sacral region, unstageable: Secondary | ICD-10-CM | POA: Diagnosis present

## 2015-12-24 DIAGNOSIS — A419 Sepsis, unspecified organism: Secondary | ICD-10-CM

## 2015-12-24 DIAGNOSIS — K5900 Constipation, unspecified: Secondary | ICD-10-CM | POA: Diagnosis present

## 2015-12-24 DIAGNOSIS — Z6822 Body mass index (BMI) 22.0-22.9, adult: Secondary | ICD-10-CM | POA: Diagnosis not present

## 2015-12-24 DIAGNOSIS — N170 Acute kidney failure with tubular necrosis: Secondary | ICD-10-CM | POA: Insufficient documentation

## 2015-12-24 DIAGNOSIS — T83511A Infection and inflammatory reaction due to indwelling urethral catheter, initial encounter: Principal | ICD-10-CM | POA: Diagnosis present

## 2015-12-24 DIAGNOSIS — Y846 Urinary catheterization as the cause of abnormal reaction of the patient, or of later complication, without mention of misadventure at the time of the procedure: Secondary | ICD-10-CM | POA: Diagnosis present

## 2015-12-24 DIAGNOSIS — F17210 Nicotine dependence, cigarettes, uncomplicated: Secondary | ICD-10-CM | POA: Diagnosis present

## 2015-12-24 DIAGNOSIS — N179 Acute kidney failure, unspecified: Secondary | ICD-10-CM | POA: Diagnosis present

## 2015-12-24 DIAGNOSIS — L89619 Pressure ulcer of right heel, unspecified stage: Secondary | ICD-10-CM | POA: Diagnosis present

## 2015-12-24 DIAGNOSIS — E876 Hypokalemia: Secondary | ICD-10-CM | POA: Diagnosis not present

## 2015-12-24 DIAGNOSIS — N390 Urinary tract infection, site not specified: Secondary | ICD-10-CM | POA: Diagnosis present

## 2015-12-24 DIAGNOSIS — R7881 Bacteremia: Secondary | ICD-10-CM | POA: Diagnosis not present

## 2015-12-24 DIAGNOSIS — R4182 Altered mental status, unspecified: Secondary | ICD-10-CM | POA: Diagnosis present

## 2015-12-24 DIAGNOSIS — J9600 Acute respiratory failure, unspecified whether with hypoxia or hypercapnia: Secondary | ICD-10-CM | POA: Diagnosis not present

## 2015-12-24 DIAGNOSIS — D649 Anemia, unspecified: Secondary | ICD-10-CM | POA: Diagnosis present

## 2015-12-24 DIAGNOSIS — L89522 Pressure ulcer of left ankle, stage 2: Secondary | ICD-10-CM | POA: Diagnosis present

## 2015-12-24 DIAGNOSIS — E872 Acidosis: Secondary | ICD-10-CM | POA: Diagnosis present

## 2015-12-24 DIAGNOSIS — E46 Unspecified protein-calorie malnutrition: Secondary | ICD-10-CM | POA: Diagnosis present

## 2015-12-24 DIAGNOSIS — M869 Osteomyelitis, unspecified: Secondary | ICD-10-CM | POA: Diagnosis present

## 2015-12-24 DIAGNOSIS — R6521 Severe sepsis with septic shock: Secondary | ICD-10-CM | POA: Diagnosis present

## 2015-12-24 DIAGNOSIS — S14105A Unspecified injury at C5 level of cervical spinal cord, initial encounter: Secondary | ICD-10-CM | POA: Diagnosis present

## 2015-12-24 DIAGNOSIS — R000 Tachycardia, unspecified: Secondary | ICD-10-CM | POA: Diagnosis present

## 2015-12-24 LAB — COMPREHENSIVE METABOLIC PANEL
ALK PHOS: 178 U/L — AB (ref 38–126)
ALT: 19 U/L (ref 17–63)
ANION GAP: 15 (ref 5–15)
AST: 30 U/L (ref 15–41)
Albumin: 2.6 g/dL — ABNORMAL LOW (ref 3.5–5.0)
BUN: 58 mg/dL — ABNORMAL HIGH (ref 6–20)
CALCIUM: 8.8 mg/dL — AB (ref 8.9–10.3)
CO2: 19 mmol/L — AB (ref 22–32)
Chloride: 103 mmol/L (ref 101–111)
Creatinine, Ser: 3.96 mg/dL — ABNORMAL HIGH (ref 0.61–1.24)
GFR, EST AFRICAN AMERICAN: 19 mL/min — AB (ref >60)
GFR, EST NON AFRICAN AMERICAN: 17 mL/min — AB (ref >60)
Glucose, Bld: 84 mg/dL (ref 65–99)
Potassium: 4.5 mmol/L (ref 3.5–5.1)
SODIUM: 137 mmol/L (ref 135–145)
Total Bilirubin: 0.8 mg/dL (ref 0.3–1.2)
Total Protein: 7 g/dL (ref 6.5–8.1)

## 2015-12-24 LAB — I-STAT ARTERIAL BLOOD GAS, ED
Acid-base deficit: 9 mmol/L — ABNORMAL HIGH (ref 0.0–2.0)
BICARBONATE: 17.5 meq/L — AB (ref 20.0–24.0)
O2 SAT: 100 %
PCO2 ART: 38.9 mmHg (ref 35.0–45.0)
TCO2: 19 mmol/L (ref 0–100)
pH, Arterial: 7.261 — ABNORMAL LOW (ref 7.350–7.450)
pO2, Arterial: 466 mmHg — ABNORMAL HIGH (ref 80.0–100.0)

## 2015-12-24 LAB — LACTIC ACID, PLASMA
LACTIC ACID, VENOUS: 6.5 mmol/L — AB (ref 0.5–2.0)
LACTIC ACID, VENOUS: 3 mmol/L — AB (ref 0.5–2.0)

## 2015-12-24 LAB — CORTISOL: Cortisol, Plasma: 40.7 ug/dL

## 2015-12-24 LAB — CBC WITH DIFFERENTIAL/PLATELET
BASOS PCT: 0 %
Basophils Absolute: 0 10*3/uL (ref 0.0–0.1)
Eosinophils Absolute: 0 10*3/uL (ref 0.0–0.7)
Eosinophils Relative: 0 %
HEMATOCRIT: 26.6 % — AB (ref 39.0–52.0)
HEMOGLOBIN: 8.1 g/dL — AB (ref 13.0–17.0)
LYMPHS ABS: 0.5 10*3/uL — AB (ref 0.7–4.0)
LYMPHS PCT: 3 %
MCH: 26.4 pg (ref 26.0–34.0)
MCHC: 30.5 g/dL (ref 30.0–36.0)
MCV: 86.6 fL (ref 78.0–100.0)
MONOS PCT: 1 %
Monocytes Absolute: 0.2 10*3/uL (ref 0.1–1.0)
NEUTROS ABS: 17.7 10*3/uL — AB (ref 1.7–7.7)
NEUTROS PCT: 96 %
Platelets: 449 10*3/uL — ABNORMAL HIGH (ref 150–400)
RBC: 3.07 MIL/uL — ABNORMAL LOW (ref 4.22–5.81)
RDW: 14.8 % (ref 11.5–15.5)
WBC: 18.4 10*3/uL — ABNORMAL HIGH (ref 4.0–10.5)

## 2015-12-24 LAB — URINALYSIS, ROUTINE W REFLEX MICROSCOPIC
Glucose, UA: NEGATIVE mg/dL
Ketones, ur: 15 mg/dL — AB
NITRITE: POSITIVE — AB
PH: 8 (ref 5.0–8.0)
Protein, ur: >300 mg/dL — AB
SPECIFIC GRAVITY, URINE: 1.015 (ref 1.005–1.030)

## 2015-12-24 LAB — I-STAT CHEM 8, ED
BUN: 61 mg/dL — AB (ref 6–20)
CREATININE: 3.9 mg/dL — AB (ref 0.61–1.24)
Calcium, Ion: 1.07 mmol/L — ABNORMAL LOW (ref 1.12–1.23)
Chloride: 103 mmol/L (ref 101–111)
Glucose, Bld: 77 mg/dL (ref 65–99)
HEMATOCRIT: 27 % — AB (ref 39.0–52.0)
Hemoglobin: 9.2 g/dL — ABNORMAL LOW (ref 13.0–17.0)
POTASSIUM: 4.5 mmol/L (ref 3.5–5.1)
Sodium: 137 mmol/L (ref 135–145)
TCO2: 21 mmol/L (ref 0–100)

## 2015-12-24 LAB — I-STAT CG4 LACTIC ACID, ED
LACTIC ACID, VENOUS: 7.07 mmol/L — AB (ref 0.5–2.0)
Lactic Acid, Venous: 3.92 mmol/L (ref 0.5–2.0)

## 2015-12-24 LAB — URINE MICROSCOPIC-ADD ON
BACTERIA UA: NONE SEEN
SQUAMOUS EPITHELIAL / LPF: NONE SEEN

## 2015-12-24 LAB — GLUCOSE, CAPILLARY: Glucose-Capillary: 108 mg/dL — ABNORMAL HIGH (ref 65–99)

## 2015-12-24 LAB — PROCALCITONIN: Procalcitonin: 87.72 ng/mL

## 2015-12-24 LAB — PROTIME-INR
INR: 2.06 — AB (ref 0.00–1.49)
PROTHROMBIN TIME: 23 s — AB (ref 11.6–15.2)

## 2015-12-24 LAB — CBG MONITORING, ED: Glucose-Capillary: 109 mg/dL — ABNORMAL HIGH (ref 65–99)

## 2015-12-24 LAB — TROPONIN I: TROPONIN I: 0.04 ng/mL — AB (ref <0.031)

## 2015-12-24 MED ORDER — HEPARIN SODIUM (PORCINE) 5000 UNIT/ML IJ SOLN
5000.0000 [IU] | Freq: Three times a day (TID) | INTRAMUSCULAR | Status: DC
Start: 2015-12-24 — End: 2015-12-29
  Administered 2015-12-24 – 2015-12-29 (×14): 5000 [IU] via SUBCUTANEOUS
  Filled 2015-12-24 (×18): qty 1

## 2015-12-24 MED ORDER — IPRATROPIUM-ALBUTEROL 0.5-2.5 (3) MG/3ML IN SOLN
3.0000 mL | Freq: Four times a day (QID) | RESPIRATORY_TRACT | Status: DC
  Administered 2015-12-24 – 2015-12-25 (×5): 3 mL via RESPIRATORY_TRACT
  Filled 2015-12-24 (×6): qty 3

## 2015-12-24 MED ORDER — FENTANYL BOLUS VIA INFUSION
50.0000 ug | INTRAVENOUS | Status: DC | PRN
  Administered 2015-12-25 (×2): 50 ug via INTRAVENOUS
  Filled 2015-12-24: qty 50

## 2015-12-24 MED ORDER — SODIUM CHLORIDE 0.9 % IV SOLN: 250.0000 mL | INTRAVENOUS | Status: DC | PRN

## 2015-12-24 MED ORDER — PIPERACILLIN-TAZOBACTAM 3.375 G IVPB
3.3750 g | Freq: Three times a day (TID) | INTRAVENOUS | Status: DC
  Administered 2015-12-24 – 2015-12-27 (×9): 3.375 g via INTRAVENOUS
  Filled 2015-12-24 (×12): qty 50

## 2015-12-24 MED ORDER — CHLORHEXIDINE GLUCONATE 0.12% ORAL RINSE (MEDLINE KIT)
15.0000 mL | Freq: Two times a day (BID) | OROMUCOSAL | Status: DC
  Administered 2015-12-24 – 2015-12-25 (×2): 15 mL via OROMUCOSAL

## 2015-12-24 MED ORDER — SODIUM CHLORIDE 0.9 % IV BOLUS (SEPSIS)
2000.0000 mL | Freq: Once | INTRAVENOUS | Status: AC
  Administered 2015-12-24: 2000 mL via INTRAVENOUS

## 2015-12-24 MED ORDER — SODIUM CHLORIDE 0.9 % IV SOLN
INTRAVENOUS | Status: DC
  Administered 2015-12-24 – 2015-12-26 (×4): via INTRAVENOUS

## 2015-12-24 MED ORDER — SODIUM CHLORIDE 0.9 % IV BOLUS (SEPSIS)
1000.0000 mL | INTRAVENOUS | Status: AC
  Administered 2015-12-24 (×2): 1000 mL via INTRAVENOUS

## 2015-12-24 MED ORDER — VANCOMYCIN HCL IN DEXTROSE 750-5 MG/150ML-% IV SOLN
750.0000 mg | INTRAVENOUS | Status: DC
  Administered 2015-12-25: 750 mg via INTRAVENOUS
  Filled 2015-12-24: qty 150

## 2015-12-24 MED ORDER — PIPERACILLIN-TAZOBACTAM 3.375 G IVPB 30 MIN
3.3750 g | Freq: Once | INTRAVENOUS | Status: AC
  Administered 2015-12-24: 3.375 g via INTRAVENOUS
  Filled 2015-12-24: qty 50

## 2015-12-24 MED ORDER — VANCOMYCIN HCL IN DEXTROSE 1-5 GM/200ML-% IV SOLN
1000.0000 mg | Freq: Once | INTRAVENOUS | Status: AC
  Administered 2015-12-24: 1000 mg via INTRAVENOUS
  Filled 2015-12-24: qty 200

## 2015-12-24 MED ORDER — PANTOPRAZOLE SODIUM 40 MG IV SOLR
40.0000 mg | Freq: Every day | INTRAVENOUS | Status: DC
  Administered 2015-12-24 – 2015-12-26 (×3): 40 mg via INTRAVENOUS
  Filled 2015-12-24 (×4): qty 40

## 2015-12-24 MED ORDER — ALBUTEROL SULFATE (2.5 MG/3ML) 0.083% IN NEBU: 2.5000 mg | INHALATION_SOLUTION | RESPIRATORY_TRACT | Status: DC | PRN

## 2015-12-24 MED ORDER — BACLOFEN 10 MG PO TABS
5.0000 mg | ORAL_TABLET | Freq: Three times a day (TID) | ORAL | Status: DC
  Administered 2015-12-24 – 2015-12-28 (×11): 5 mg via ORAL
  Filled 2015-12-24 (×12): qty 1

## 2015-12-24 MED ORDER — MIDAZOLAM HCL 2 MG/2ML IJ SOLN
2.0000 mg | INTRAMUSCULAR | Status: DC | PRN
  Administered 2015-12-24 (×2): 2 mg via INTRAVENOUS
  Filled 2015-12-24: qty 2

## 2015-12-24 MED ORDER — FENTANYL CITRATE (PF) 100 MCG/2ML IJ SOLN
100.0000 ug | INTRAMUSCULAR | Status: DC | PRN
Start: 2015-12-24 — End: 2015-12-24
  Administered 2015-12-24: 100 ug via INTRAVENOUS
  Filled 2015-12-24: qty 2

## 2015-12-24 MED ORDER — NOREPINEPHRINE BITARTRATE 1 MG/ML IV SOLN
2.0000 ug/min | INTRAVENOUS | Status: DC
  Administered 2015-12-24: 10 ug/min via INTRAVENOUS
  Administered 2015-12-25: 8 ug/min via INTRAVENOUS
  Filled 2015-12-24 (×3): qty 4

## 2015-12-24 MED ORDER — ANTISEPTIC ORAL RINSE SOLUTION (CORINZ)
7.0000 mL | Freq: Four times a day (QID) | OROMUCOSAL | Status: DC
  Administered 2015-12-24 – 2015-12-25 (×4): 7 mL via OROMUCOSAL

## 2015-12-24 MED ORDER — SODIUM CHLORIDE 0.9 % IV BOLUS (SEPSIS): 500.0000 mL | INTRAVENOUS | Status: AC

## 2015-12-24 MED ORDER — ASPIRIN 81 MG PO CHEW: 324.0000 mg | CHEWABLE_TABLET | ORAL | Status: AC

## 2015-12-24 MED ORDER — ONDANSETRON HCL 4 MG/2ML IJ SOLN: 4.0000 mg | Freq: Four times a day (QID) | INTRAMUSCULAR | Status: DC | PRN

## 2015-12-24 MED ORDER — BISACODYL 10 MG RE SUPP
10.0000 mg | Freq: Every day | RECTAL | Status: DC | PRN
Start: 2015-12-24 — End: 2015-12-29

## 2015-12-24 MED ORDER — ACETAMINOPHEN 325 MG PO TABS
650.0000 mg | ORAL_TABLET | ORAL | Status: DC | PRN
  Administered 2015-12-25 – 2015-12-29 (×6): 650 mg via ORAL
  Filled 2015-12-24 (×6): qty 2

## 2015-12-24 MED ORDER — ETOMIDATE 2 MG/ML IV SOLN
INTRAVENOUS | Status: AC | PRN
  Administered 2015-12-24: 20 mg via INTRAVENOUS

## 2015-12-24 MED ORDER — MIDAZOLAM HCL 2 MG/2ML IJ SOLN
2.0000 mg | INTRAMUSCULAR | Status: DC | PRN
  Administered 2015-12-25 (×2): 2 mg via INTRAVENOUS
  Filled 2015-12-24 (×3): qty 2

## 2015-12-24 MED ORDER — VITAL HIGH PROTEIN PO LIQD
1000.0000 mL | ORAL | Status: DC
  Administered 2015-12-24: 1000 mL
  Administered 2015-12-25: 16:00:00

## 2015-12-24 MED ORDER — FENTANYL CITRATE (PF) 2500 MCG/50ML IJ SOLN
25.0000 ug/h | INTRAMUSCULAR | Status: DC
  Administered 2015-12-24: 50 ug/h via INTRAVENOUS
  Administered 2015-12-25: 100 ug/h via INTRAVENOUS
  Administered 2015-12-25: 50 ug/h via INTRAVENOUS
  Administered 2015-12-25: 200 ug/h via INTRAVENOUS
  Administered 2015-12-25: 50 ug/h via INTRAVENOUS
  Filled 2015-12-24 (×2): qty 50

## 2015-12-24 MED ORDER — FENTANYL CITRATE (PF) 100 MCG/2ML IJ SOLN: 100.0000 ug | INTRAMUSCULAR | Status: DC | PRN

## 2015-12-24 MED ORDER — SUCCINYLCHOLINE CHLORIDE 20 MG/ML IJ SOLN
INTRAMUSCULAR | Status: AC | PRN
  Administered 2015-12-24: 100 mg via INTRAVENOUS

## 2015-12-24 MED ORDER — POLYETHYLENE GLYCOL 3350 17 G PO PACK
17.0000 g | PACK | Freq: Every day | ORAL | Status: DC
  Administered 2015-12-25 – 2015-12-29 (×4): 17 g via ORAL
  Filled 2015-12-24 (×6): qty 1

## 2015-12-24 MED ORDER — ASPIRIN 300 MG RE SUPP
300.0000 mg | RECTAL | Status: AC
  Administered 2015-12-24: 300 mg via RECTAL
  Filled 2015-12-24: qty 1

## 2015-12-24 MED ORDER — FENTANYL CITRATE (PF) 100 MCG/2ML IJ SOLN
50.0000 ug | Freq: Once | INTRAMUSCULAR | Status: AC
  Administered 2015-12-24: 50 ug via INTRAVENOUS
  Filled 2015-12-24: qty 2

## 2015-12-24 NOTE — ED Notes (Signed)
Pt turned on L side.

## 2015-12-24 NOTE — Code Documentation (Signed)
Successful intubation by Dr. Roderic Palau

## 2015-12-24 NOTE — ED Notes (Signed)
Pt moved onto hospital bed, pt calm and relaxed at this time. Pressure ulcer dressing applied to sacrum and R heel

## 2015-12-24 NOTE — Progress Notes (Signed)
Utilization Review Completed.  

## 2015-12-24 NOTE — ED Notes (Addendum)
Per EMS, pt coming from [name] facility due to AMS and hypotension and tachycardia. Pt usually alert and can talk. Pt will open eyes to voice. Pt non-verbal at this time. Pt had new foley placed this morning, blood coming through catheter at this time. EMS reports ST 130, Initial BP 80 palpated improved to 90/60 after 550ml NS. Marland Kitchen Pt is on bactrim for UTI and Doxycyline for MRSA

## 2015-12-24 NOTE — Progress Notes (Signed)
Pharmacy Code Sepsis Protocol  Time of code sepsis page: 0937 Time of antibiotic delivery: 0945  Were antibiotics ordered at the time of the code sepsis page? Yes Was it required to contact the physician? []  Physician not contacted []  Physician contacted to order antibiotics for code sepsis []  Physician contacted to recommend changing antibiotics  Pharmacy consulted for: Vancomycin and Zosyn  Anti-infectives    Start     Dose/Rate Route Frequency Ordered Stop   12/24/15 0945  piperacillin-tazobactam (ZOSYN) IVPB 3.375 g     3.375 g 100 mL/hr over 30 Minutes Intravenous  Once 12/24/15 0930     12/24/15 0945  vancomycin (VANCOCIN) IVPB 1000 mg/200 mL premix     1,000 mg 200 mL/hr over 60 Minutes Intravenous  Once 12/24/15 0930        @infusions @   Nurse education provided: [x]  Minutes left to administer antibiotics to achieve 1 hour goal [x]  Correct order of antibiotic administration [x]  Antibiotic Y-site compatibilities     Andrey Cota. Diona Foley, PharmD, [place] Clinical Pharmacist Pager [phone] 12/24/2015, 9:39 AM

## 2015-12-24 NOTE — Progress Notes (Signed)
Patient transported to room 2M06 without any apparent complications.

## 2015-12-24 NOTE — ED Notes (Addendum)
Spoke with MD due to probable catheter malposition. MD Zammit reports to remove foley and replace it. Facility foley removed and temp foley placed. Pt had 257ml of bloody urine come out after foley removal.

## 2015-12-24 NOTE — ED Notes (Signed)
Pt agitated, pulling arms up towards tube. Fentanyl increased

## 2015-12-24 NOTE — ED Notes (Signed)
Patient muscles twitching and having difficulty obtaining accurate blood pressure. Patient moving head and extremities mildly. Radial pulses present but weak. Femoral pulses dopplered and strong. Otila Kluver RN informed CCM of potential need for A-line. Brandy NP to assess.

## 2015-12-24 NOTE — ED Notes (Signed)
Md notified pt is having 10-15 second periods of apnea.

## 2015-12-24 NOTE — ED Notes (Signed)
Pt given 10 of etomidate for sedation

## 2015-12-24 NOTE — Procedures (Signed)
Central Venous Catheter Insertion Procedure Note Phillip [name] [place]:[id number] 13-May-1966  Procedure: Insertion of Central Venous Catheter Indications: Assessment of intravascular volume, Drug and/or fluid administration and Frequent blood sampling  Procedure Details Consent: Unable to obtain consent because of emergent medical necessity.   Time Out: Verified patient identification, verified procedure, site/side was marked, verified correct patient position, special equipment/implants available, medications/allergies/relevent history reviewed, required imaging and test results available.  Performed  Maximum sterile technique was used including antiseptics, cap, gloves, gown, hand hygiene, mask and sheet. Skin prep: Chlorhexidine; local anesthetic administered A antimicrobial bonded/coated triple lumen catheter was placed in the right internal jugular vein to 15 cm using the Seldinger technique.  Evaluation Blood flow good Complications: No apparent complications Patient did tolerate procedure well. Chest X-ray ordered to verify placement.  CXR: pending.    Procedure performed under direct supervision of Dr. Lamonte Sakai and with ultrasound guidance for real time vessel cannulation.      Phillip Gens, NP-C [facility] Pgr: [phone] or if no answer [phone] 12/24/2015, 12:47 PM  Baltazar Apo, MD, PhD 12/24/2015, 3:39 PM [facility] [phone] or if no answer [phone]
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
## 2015-12-24 NOTE — ED Notes (Signed)
Pt POA Tori [phone]
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
## 2015-12-24 NOTE — ED Notes (Signed)
Code sepsis activated.
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
## 2015-12-24 NOTE — ED Notes (Signed)
Attempting to get manual BP at thsi time

## 2015-12-24 NOTE — ED Notes (Signed)
Brandi notified of pt Lactate

## 2015-12-25 ENCOUNTER — Inpatient Hospital Stay (HOSPITAL_COMMUNITY): Payer: Medicaid Other

## 2015-12-25 DIAGNOSIS — N179 Acute kidney failure, unspecified: Secondary | ICD-10-CM

## 2015-12-25 DIAGNOSIS — J9601 Acute respiratory failure with hypoxia: Secondary | ICD-10-CM

## 2015-12-25 DIAGNOSIS — R7881 Bacteremia: Secondary | ICD-10-CM

## 2015-12-25 LAB — CBC
HEMATOCRIT: 22.4 % — AB (ref 39.0–52.0)
Hemoglobin: 6.8 g/dL — CL (ref 13.0–17.0)
MCH: 26.3 pg (ref 26.0–34.0)
MCHC: 30.4 g/dL (ref 30.0–36.0)
MCV: 86.5 fL (ref 78.0–100.0)
Platelets: 357 10*3/uL (ref 150–400)
RBC: 2.59 MIL/uL — AB (ref 4.22–5.81)
RDW: 15 % (ref 11.5–15.5)
WBC: 21.7 10*3/uL — AB (ref 4.0–10.5)

## 2015-12-25 LAB — POCT I-STAT 3, ART BLOOD GAS (G3+)
Acid-base deficit: 4 mmol/L — ABNORMAL HIGH (ref 0.0–2.0)
Bicarbonate: 20.8 mEq/L (ref 20.0–24.0)
O2 Saturation: 99 %
PCO2 ART: 37.2 mmHg (ref 35.0–45.0)
PH ART: 7.356 (ref 7.350–7.450)
Patient temperature: 98.6
TCO2: 22 mmol/L (ref 0–100)
pO2, Arterial: 166 mmHg — ABNORMAL HIGH (ref 80.0–100.0)

## 2015-12-25 LAB — BASIC METABOLIC PANEL
ANION GAP: 9 (ref 5–15)
BUN: 23 mg/dL — AB (ref 6–20)
CO2: 22 mmol/L (ref 22–32)
Calcium: 8.4 mg/dL — ABNORMAL LOW (ref 8.9–10.3)
Chloride: 111 mmol/L (ref 101–111)
Creatinine, Ser: 1.06 mg/dL (ref 0.61–1.24)
GFR calc Af Amer: >60 mL/min (ref >60)
GFR calc non Af Amer: >60 mL/min (ref >60)
GLUCOSE: 112 mg/dL — AB (ref 65–99)
POTASSIUM: 4.1 mmol/L (ref 3.5–5.1)
Sodium: 142 mmol/L (ref 135–145)

## 2015-12-25 LAB — GLUCOSE, CAPILLARY
GLUCOSE-CAPILLARY: 126 mg/dL — AB (ref 65–99)
Glucose-Capillary: 107 mg/dL — ABNORMAL HIGH (ref 65–99)
Glucose-Capillary: 117 mg/dL — ABNORMAL HIGH (ref 65–99)
Glucose-Capillary: 126 mg/dL — ABNORMAL HIGH (ref 65–99)
Glucose-Capillary: 127 mg/dL — ABNORMAL HIGH (ref 65–99)

## 2015-12-25 LAB — HEMOGLOBIN AND HEMATOCRIT, BLOOD
HCT: 24.7 % — ABNORMAL LOW (ref 39.0–52.0)
Hemoglobin: 7.7 g/dL — ABNORMAL LOW (ref 13.0–17.0)

## 2015-12-25 LAB — MAGNESIUM: Magnesium: 1.8 mg/dL (ref 1.7–2.4)

## 2015-12-25 LAB — MRSA PCR SCREENING: MRSA BY PCR: NEGATIVE

## 2015-12-25 LAB — PREPARE RBC (CROSSMATCH)

## 2015-12-25 LAB — PHOSPHORUS: Phosphorus: 3.4 mg/dL (ref 2.5–4.6)

## 2015-12-25 MED ORDER — SODIUM CHLORIDE 0.9 % IV SOLN
Freq: Once | INTRAVENOUS | Status: AC
  Administered 2015-12-25: 08:00:00 via INTRAVENOUS

## 2015-12-25 MED ORDER — MIDAZOLAM HCL 2 MG/2ML IJ SOLN: 2.0000 mg | Freq: Four times a day (QID) | INTRAMUSCULAR | Status: DC | PRN

## 2015-12-25 NOTE — Progress Notes (Signed)
Pt agitated, attempting to self extubate.  Received order for versed IVP prn per Dr. Tamala Julian.  Will continue to monitor.

## 2015-12-25 NOTE — Progress Notes (Signed)
Bite block placed per Respiratory Therapy due to pt frequently biting ETT.  Pt tol well, will continue to monitor.

## 2015-12-25 NOTE — Progress Notes (Signed)
Pt refused nebulizer tx.  At this time pt agrees to wear pulse ox probe.

## 2015-12-25 NOTE — Procedures (Signed)
Extubation Procedure Note  Patient Details:   Name: Phillip [name] DOB: October 10, 1966 MRN: [id number]   Airway Documentation: Extubated to 4l/min [place] Incentive spirometer instructed    Evaluation  O2 sats: stable throughout Complications: No apparent complications Patient did tolerate procedure well. Bilateral Breath Sounds: Clear, Diminished Suctioning: Airway Yes  Revonda Standard 12/25/2015, 4:15 PM

## 2015-12-25 NOTE — Progress Notes (Signed)
200 mL of fentanyl wasted in the sink. Witnessed by [facility]

## 2015-12-25 NOTE — Progress Notes (Signed)
Pt conversed with sister, [name], over phone per pt request.  Sister updated, and states she will visit in the am.

## 2015-12-25 NOTE — Progress Notes (Signed)
CRITICAL VALUE ALERT  Critical value received:  hgb 6.8  Date of notification:  12/25/15  Time of notification:  0717  Critical value read back:Yes.    Nurse who received alert:  A Milana Obey RN  MD notified (1st page):  Rice MD  Time of first page:  [phone]  MD notified (2nd page):  Time of second page:  Responding MD:  Benjamine Mola MD  Time MD responded:  [phone]

## 2015-12-25 NOTE — Progress Notes (Addendum)
Consulted E-link nurse due to increasing trend in HR on levophed infusion, and potential for alternate pressor.  Will continue to monitor.
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
## 2015-12-25 NOTE — Progress Notes (Signed)
Pt refusing to wear pulse ox probe.  Will continue to monitor.

## 2015-12-25 NOTE — Progress Notes (Signed)
Pt's temperature trending upward, currently 102.6 via Foley, and correlating with oral temp. Administered 650mg  Tylenol and ice packs to axillae and groin.  Will continue to monitor.

## 2015-12-25 NOTE — Progress Notes (Addendum)
Pt refusing to let CNA obtain oral temperature and CBG.  Explained to pt the importance of monitoring VS. Pt remains adamant about refusal.  Will continue to monitor.

## 2015-12-26 DIAGNOSIS — J9601 Acute respiratory failure with hypoxia: Secondary | ICD-10-CM | POA: Diagnosis present

## 2015-12-26 DIAGNOSIS — A4159 Other Gram-negative sepsis: Secondary | ICD-10-CM | POA: Diagnosis present

## 2015-12-26 LAB — BASIC METABOLIC PANEL
ANION GAP: 9 (ref 5–15)
BUN: 6 mg/dL (ref 6–20)
CO2: 24 mmol/L (ref 22–32)
Calcium: 9.1 mg/dL (ref 8.9–10.3)
Chloride: 107 mmol/L (ref 101–111)
Creatinine, Ser: 0.57 mg/dL — ABNORMAL LOW (ref 0.61–1.24)
GLUCOSE: 79 mg/dL (ref 65–99)
POTASSIUM: 3.3 mmol/L — AB (ref 3.5–5.1)
SODIUM: 140 mmol/L (ref 135–145)

## 2015-12-26 LAB — TYPE AND SCREEN
ABO/RH(D): O POS
Antibody Screen: NEGATIVE
UNIT DIVISION: 0

## 2015-12-26 LAB — URINE CULTURE: Culture: >=100000

## 2015-12-26 LAB — GLUCOSE, CAPILLARY
GLUCOSE-CAPILLARY: 82 mg/dL (ref 65–99)
GLUCOSE-CAPILLARY: 71 mg/dL (ref 65–99)
GLUCOSE-CAPILLARY: 98 mg/dL (ref 65–99)
Glucose-Capillary: 73 mg/dL (ref 65–99)
Glucose-Capillary: 114 mg/dL — ABNORMAL HIGH (ref 65–99)
Glucose-Capillary: 111 mg/dL — ABNORMAL HIGH (ref 65–99)

## 2015-12-26 LAB — CBC
HEMATOCRIT: 24.1 % — AB (ref 39.0–52.0)
HEMOGLOBIN: 7.6 g/dL — AB (ref 13.0–17.0)
MCH: 26.7 pg (ref 26.0–34.0)
MCHC: 31.5 g/dL (ref 30.0–36.0)
MCV: 84.6 fL (ref 78.0–100.0)
Platelets: 311 10*3/uL (ref 150–400)
RBC: 2.85 MIL/uL — AB (ref 4.22–5.81)
RDW: 14.6 % (ref 11.5–15.5)
WBC: 12.3 10*3/uL — AB (ref 4.0–10.5)

## 2015-12-26 MED ORDER — GABAPENTIN 300 MG PO CAPS
300.0000 mg | ORAL_CAPSULE | Freq: Three times a day (TID) | ORAL | Status: DC
  Administered 2015-12-26 – 2015-12-29 (×11): 300 mg via ORAL
  Filled 2015-12-26 (×12): qty 1

## 2015-12-26 MED ORDER — ENSURE ENLIVE PO LIQD
237.0000 mL | Freq: Two times a day (BID) | ORAL | Status: DC
  Administered 2015-12-26 – 2015-12-27 (×2): 237 mL via ORAL

## 2015-12-26 MED ORDER — SENNA 8.6 MG PO TABS
2.0000 | ORAL_TABLET | Freq: Every day | ORAL | Status: DC
  Administered 2015-12-27 – 2015-12-29 (×3): 17.2 mg via ORAL
  Filled 2015-12-26 (×4): qty 2

## 2015-12-26 MED ORDER — METOPROLOL TARTRATE 25 MG PO TABS
25.0000 mg | ORAL_TABLET | Freq: Two times a day (BID) | ORAL | Status: DC
  Administered 2015-12-26 – 2015-12-29 (×7): 25 mg via ORAL
  Filled 2015-12-26 (×8): qty 1

## 2015-12-26 MED ORDER — POTASSIUM CHLORIDE CRYS ER 20 MEQ PO TBCR
40.0000 meq | EXTENDED_RELEASE_TABLET | Freq: Once | ORAL | Status: AC
  Administered 2015-12-26: 40 meq via ORAL
  Filled 2015-12-26: qty 2

## 2015-12-26 MED ORDER — AMITRIPTYLINE HCL 25 MG PO TABS
25.0000 mg | ORAL_TABLET | Freq: Every day | ORAL | Status: DC
  Administered 2015-12-26 – 2015-12-29 (×3): 25 mg via ORAL
  Filled 2015-12-26 (×4): qty 1

## 2015-12-26 NOTE — Progress Notes (Signed)
Handoff communication to Carlus Pavlov, BSN at [place].

## 2015-12-26 NOTE — Progress Notes (Signed)
Patient transferred to 5W24 in stable condition. Oriented to room and call light. No s/s of acute distress. Bed in lowest position, call light within reach. Will cont to monitor.

## 2015-12-26 NOTE — Clinical Documentation Improvement (Addendum)
Critical Care  Addendum 1/18 -- I see the "I agree" written in the body of the BPA.  Please include in your future progress note and discharge summary.  Can the diagnosis of Malnutrition be further specified?   Document Severity - Severe(third degree), Moderate (second degree), Mild (first degree)  Other condition  Unable to clinically determine  Document any associated diagnoses/conditions  Supporting Information: Current documentation states "Protein Calorie Malnutrition". Only weight found documented by nursing this admission- 153 pounds on admission 1/14, 12/26/15 documented as 148 pounds, 2.4 ounces.     Please exercise your independent, professional judgment when responding. A specific answer is not anticipated or expected.Please update your documentation within the medical record to reflect your response to this query.  Thank you, Mateo Flow, RN [phone] Clinical Documentation Specialist

## 2015-12-26 NOTE — Progress Notes (Signed)
Report given to [place] on 5W at this time.  Pt has no c/o anything.  No s/s of any acute distress.  Sister at bedside and aware of transfer.
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
## 2015-12-26 NOTE — Progress Notes (Signed)
Pt has been refusing to nourishment, but agrees to drink orange juice as CBG 73.

## 2015-12-26 NOTE — Progress Notes (Signed)
PT Cancellation Note  Patient Details Name: Phillip [name] MRN: [id number] DOB: October 14, 1966   Cancelled Treatment:    Reason Eval/Treat Not Completed: Patient declined, no reason specified Pt declines participating in therapy. Has blanket pulled over head. Reports he has not slept well. Will follow up.   Marguarite Arbour A Chalsea Darko 12/26/2015, 3:07 PM  Wray Kearns, [place], DPT [phone]

## 2015-12-26 NOTE — Consult Note (Signed)
WOC wound consult note Reason for Consult: Consult requested for BLE.  Pt is familiar to [place] from previous admission.   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  He was using a Vac dressing during last admission but states this was discontinued at some point.  He is followed as an outpatient by ortho service.   4X2X.3cm, 90% red, 10% yellow wound bed, mod amt yellow drainage, no odor, bone palpable. Right anterior foot with partial thickness wound where previous blister has ruptured.  1.2X1.2X.1cm, pink and dry Right outer foot with deep tissue injury; .2X.2cm, dark reddish purple Left posterior knee with healing previous stage 3 wound; 2X1X.1cm, pink and dry Left outer ankle with healing previous stage 3 wound; .3X.3X.1cm, pink and dry Left outer foot with healing previous stage 3 wound; .2X.2X.1cm, pink and dry Pressure Ulcer POA: Yes Dressing procedure/placement/frequency: Float heels to reduce pressure.  Foam dressing to healing wounds.  Aquacel to absorb drainage and provide antimicrobial benefits.  Pt can resume follow up with ortho service after discharge. Please re-consult if further assistance is needed.  Thank-you,  Julien Girt MSN, [place], [place], [place], [place]

## 2015-12-26 NOTE — Progress Notes (Signed)
Nutrition Brief Note  RD consulted for enteral/tube-feeding management but pt is no longer intubated, no longer receiving tube feeds.  Pt does not have any acute concerns at this time. Complains of some nausea for a couple days prior to admission. No weight loss; appetite was ok; some diarrhea, no chewing or swallowing problems, no constipation.  Nutrition-Focused physical exam completed. Findings are no fat depletion, no muscle depletion, and no edema.   No RD interventions warranted at this time but pt did request ensure, will provide BID.  Satira Anis. Woody Kronberg, MS, RD LDN After Hours/Weekend Pager [phone]

## 2015-12-27 DIAGNOSIS — L89619 Pressure ulcer of right heel, unspecified stage: Secondary | ICD-10-CM

## 2015-12-27 DIAGNOSIS — R509 Fever, unspecified: Secondary | ICD-10-CM | POA: Diagnosis present

## 2015-12-27 DIAGNOSIS — G8250 Quadriplegia, unspecified: Secondary | ICD-10-CM

## 2015-12-27 DIAGNOSIS — B964 Proteus (mirabilis) (morganii) as the cause of diseases classified elsewhere: Secondary | ICD-10-CM

## 2015-12-27 DIAGNOSIS — Z9889 Other specified postprocedural states: Secondary | ICD-10-CM

## 2015-12-27 DIAGNOSIS — D62 Acute posthemorrhagic anemia: Secondary | ICD-10-CM

## 2015-12-27 DIAGNOSIS — Z960 Presence of urogenital implants: Secondary | ICD-10-CM

## 2015-12-27 LAB — CULTURE, BLOOD (ROUTINE X 2)

## 2015-12-27 LAB — CBC
HCT: 27.3 % — ABNORMAL LOW (ref 39.0–52.0)
Hemoglobin: 8.7 g/dL — ABNORMAL LOW (ref 13.0–17.0)
MCH: 26.8 pg (ref 26.0–34.0)
MCHC: 31.9 g/dL (ref 30.0–36.0)
MCV: 84 fL (ref 78.0–100.0)
PLATELETS: 326 10*3/uL (ref 150–400)
RBC: 3.25 MIL/uL — ABNORMAL LOW (ref 4.22–5.81)
RDW: 14 % (ref 11.5–15.5)
WBC: 9.5 10*3/uL (ref 4.0–10.5)

## 2015-12-27 LAB — BASIC METABOLIC PANEL
Anion gap: 10 (ref 5–15)
BUN: 5 mg/dL — AB (ref 6–20)
CALCIUM: 9.3 mg/dL (ref 8.9–10.3)
CHLORIDE: 106 mmol/L (ref 101–111)
CO2: 25 mmol/L (ref 22–32)
CREATININE: 0.54 mg/dL — AB (ref 0.61–1.24)
GFR calc Af Amer: >60 mL/min (ref >60)
Glucose, Bld: 100 mg/dL — ABNORMAL HIGH (ref 65–99)
Potassium: 3.6 mmol/L (ref 3.5–5.1)
Sodium: 141 mmol/L (ref 135–145)

## 2015-12-27 LAB — GLUCOSE, CAPILLARY
GLUCOSE-CAPILLARY: 74 mg/dL (ref 65–99)
GLUCOSE-CAPILLARY: 88 mg/dL (ref 65–99)
Glucose-Capillary: 120 mg/dL — ABNORMAL HIGH (ref 65–99)
Glucose-Capillary: 134 mg/dL — ABNORMAL HIGH (ref 65–99)
Glucose-Capillary: 145 mg/dL — ABNORMAL HIGH (ref 65–99)
Glucose-Capillary: 81 mg/dL (ref 65–99)

## 2015-12-27 MED ORDER — ZOLPIDEM TARTRATE 5 MG PO TABS
5.0000 mg | ORAL_TABLET | Freq: Every evening | ORAL | Status: DC | PRN
  Administered 2015-12-27 – 2015-12-29 (×2): 5 mg via ORAL
  Filled 2015-12-27 (×2): qty 1

## 2015-12-27 MED ORDER — DEXTROSE 5 % IV SOLN
2.0000 g | INTRAVENOUS | Status: DC
  Administered 2015-12-27 – 2015-12-29 (×3): 2 g via INTRAVENOUS
  Filled 2015-12-27 (×4): qty 2

## 2015-12-27 MED ORDER — ADULT MULTIVITAMIN W/MINERALS CH
1.0000 | ORAL_TABLET | Freq: Every day | ORAL | Status: DC
  Administered 2015-12-27 – 2015-12-29 (×3): 1 via ORAL
  Filled 2015-12-27 (×3): qty 1

## 2015-12-27 MED ORDER — PANTOPRAZOLE SODIUM 40 MG PO TBEC
40.0000 mg | DELAYED_RELEASE_TABLET | Freq: Every day | ORAL | Status: DC
  Administered 2015-12-27 – 2015-12-28 (×2): 40 mg via ORAL
  Filled 2015-12-27 (×2): qty 1

## 2015-12-27 MED ORDER — POTASSIUM CHLORIDE 20 MEQ PO PACK
40.0000 meq | PACK | Freq: Two times a day (BID) | ORAL | Status: DC
  Administered 2015-12-27: 40 meq via ORAL
  Filled 2015-12-27 (×3): qty 2

## 2015-12-27 MED ORDER — ENSURE ENLIVE PO LIQD
237.0000 mL | Freq: Three times a day (TID) | ORAL | Status: DC
  Administered 2015-12-27 – 2015-12-29 (×6): 237 mL via ORAL

## 2015-12-27 MED ORDER — IPRATROPIUM-ALBUTEROL 0.5-2.5 (3) MG/3ML IN SOLN: 3.0000 mL | Freq: Four times a day (QID) | RESPIRATORY_TRACT | Status: DC | PRN

## 2015-12-27 MED ORDER — POTASSIUM CHLORIDE CRYS ER 20 MEQ PO TBCR
40.0000 meq | EXTENDED_RELEASE_TABLET | Freq: Once | ORAL | Status: AC
  Administered 2015-12-27: 40 meq via ORAL
  Filled 2015-12-27: qty 2

## 2015-12-27 NOTE — Evaluation (Signed)
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 Pt currently with functional limitations due to the deficits listed below (see PT Problem List).  Pt will benefit from skilled PT to increase their independence and safety with mobility to allow discharge to the venue listed below. The patient states that his previous mobility focused on rolling in bed and equipment transfers from bed to w/c. At this time the patient states that he feels like he is moving at the same functional level as prior to admission. At this time the patient is going to be D/C from PT services.      Follow Up Recommendations SNF;Supervision/Assistance - 24 hour    Equipment Recommendations  None recommended by PT    Recommendations for Other Services       Precautions / Restrictions Precautions Precautions: Fall;Other (comment) (quadriplegia) Required Braces or Orthoses: Other Brace/Splint Other Brace/Splint: prevalon boots Restrictions Weight Bearing Restrictions: No      Mobility  Bed Mobility Overal bed mobility: Needs Assistance Bed Mobility: Rolling Rolling: Mod assist         General bed mobility comments: Mod assist with rolling bilaterally, patient assisting by using UEs to pull on rails. Assist needed at pelvis to roll.   Transfers                    Ambulation/Gait                Stairs            Wheelchair Mobility    Modified Rankin (Stroke Patients Only)       Balance                                             Pertinent Vitals/Pain Pain  Assessment: No/denies pain    Home Living Family/patient expects to be discharged to:: Skilled nursing facility                      Prior Function Level of Independence: Needs assistance   Gait / Transfers Assistance Needed: Using hoyer lift for transfers from bed to w/c.      Comments: Pt states he assists with rolling in bed for transfers. States he has not sat at the edge of the bed for at least 4-5 months.       Hand Dominance        Extremity/Trunk Assessment   Upper Extremity Assessment: Generalized weakness;LUE deficits/detail (UEs, able to pull on rails for rolling. )           Lower Extremity Assessment:  (No volitional activation noted of LEs)         Communication   Communication: No difficulties  Cognition Arousal/Alertness: Awake/alert Behavior During Therapy: Flat affect Overall Cognitive Status: Within Functional Limits for tasks assessed                      General Comments General comments (skin integrity, edema, etc.): Patient states that he does not  sit at the edge of the bed. He descirbes that he does assist with rolling in bed for equipment transfer to w/c.     Exercises        Assessment/Plan    PT Assessment Patent does not need any further PT services  PT Diagnosis Generalized weakness   PT Problem List    PT Treatment Interventions     PT Goals (Current goals can be found in the Care Plan section) Acute Rehab PT Goals Patient Stated Goal: Get out of the hospital PT Goal Formulation: With patient Time For Goal Achievement: 01/10/16 Potential to Achieve Goals: Good    Frequency     Barriers to discharge        Co-evaluation               End of Session   Activity Tolerance: Patient tolerated treatment well [name];with call bell/phone within reach;with bed alarm set Nurse Communication: Mobility status         Time: [id number] PT Time Calculation (min) (ACUTE ONLY): 19  min   Charges:   PT Evaluation $PT Eval Moderate Complexity: 1 Procedure     PT G Codes:        Cassell Clement, PT, CSCS Pager [phone] Office [phone]  12/27/2015, 2:19 PM

## 2015-12-27 NOTE — Care Management Note (Addendum)
Case Management Note  Patient Details  Name: Phillip [name] MRN: [id number] Date of Birth: [birth year]  Subjective/Objective:                 Admitted with AMS/Sepsis. PMH of quadriplegia from cervical cord injury, MRSA osteomyelitis right heel, ETOH abuse, recurrent UTIs secondary to indwelling catheter. From [facility] SNF.   Action/Plan: Return to SNF when medically stable. CM to f/u with disposition needs.  Expected Discharge Date:                  Expected Discharge Plan:  Skilled Nursing Facility  In-House Referral:  Clinical Social Work  Discharge planning Services  CM Consult  Post Acute Care Choice:    Choice offered to:     DME Arranged:    DME Agency:     HH Arranged:    [place] Agency:     Status of Service:  In process, will continue to follow  Medicare Important Message Given:    Date Medicare IM Given:    Medicare IM give by:    Date Additional Medicare IM Given:    Additional Medicare Important Message give by:     If discussed at [place] of Stay Meetings, dates discussed:    Additional Comments:  CM spoke with pt regarding d/c planning . Pt stated he will return to [facility] @ D/C. Referral made with CSW for SNF. Janarius Narum (Sister)  [phone]  [place], [place] [phone] 12/27/2015, 7:42 PM
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
## 2015-12-28 DIAGNOSIS — B390 Acute pulmonary histoplasmosis capsulati: Secondary | ICD-10-CM

## 2015-12-28 LAB — CULTURE, RESPIRATORY

## 2015-12-28 LAB — CBC
HCT: 29.8 % — ABNORMAL LOW (ref 39.0–52.0)
HEMOGLOBIN: 9.5 g/dL — AB (ref 13.0–17.0)
MCH: 27 pg (ref 26.0–34.0)
MCHC: 31.9 g/dL (ref 30.0–36.0)
MCV: 84.7 fL (ref 78.0–100.0)
Platelets: 358 10*3/uL (ref 150–400)
RBC: 3.52 MIL/uL — AB (ref 4.22–5.81)
RDW: 14 % (ref 11.5–15.5)
WBC: 8.9 10*3/uL (ref 4.0–10.5)

## 2015-12-28 LAB — CULTURE, RESPIRATORY W GRAM STAIN
Culture: NO GROWTH
Gram Stain: NONE SEEN

## 2015-12-28 LAB — GLUCOSE, CAPILLARY
GLUCOSE-CAPILLARY: 95 mg/dL (ref 65–99)
GLUCOSE-CAPILLARY: 107 mg/dL — AB (ref 65–99)
Glucose-Capillary: 91 mg/dL (ref 65–99)
Glucose-Capillary: 113 mg/dL — ABNORMAL HIGH (ref 65–99)
Glucose-Capillary: 94 mg/dL (ref 65–99)

## 2015-12-28 LAB — BASIC METABOLIC PANEL
ANION GAP: 10 (ref 5–15)
BUN: 9 mg/dL (ref 6–20)
CALCIUM: 9.7 mg/dL (ref 8.9–10.3)
CHLORIDE: 107 mmol/L (ref 101–111)
CO2: 25 mmol/L (ref 22–32)
Creatinine, Ser: 0.51 mg/dL — ABNORMAL LOW (ref 0.61–1.24)
GFR calc non Af Amer: >60 mL/min (ref >60)
Glucose, Bld: 94 mg/dL (ref 65–99)
Potassium: 4 mmol/L (ref 3.5–5.1)
Sodium: 142 mmol/L (ref 135–145)

## 2015-12-28 MED ORDER — BACLOFEN 10 MG PO TABS
10.0000 mg | ORAL_TABLET | Freq: Three times a day (TID) | ORAL | Status: DC
  Administered 2015-12-28 – 2015-12-29 (×4): 10 mg via ORAL
  Filled 2015-12-28 (×4): qty 1
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
## 2015-12-28 NOTE — Progress Notes (Signed)
Pt wanted Ambien during the night shift. MD was notified, Order was placed and medication was administered as ordered.

## 2015-12-29 DIAGNOSIS — S14159S Other incomplete lesion at unspecified level of cervical spinal cord, sequela: Secondary | ICD-10-CM

## 2015-12-29 DIAGNOSIS — Y846 Urinary catheterization as the cause of abnormal reaction of the patient, or of later complication, without mention of misadventure at the time of the procedure: Secondary | ICD-10-CM

## 2015-12-29 DIAGNOSIS — T83091D Other mechanical complication of indwelling urethral catheter, subsequent encounter: Secondary | ICD-10-CM

## 2015-12-29 LAB — BASIC METABOLIC PANEL
ANION GAP: 11 (ref 5–15)
BUN: 13 mg/dL (ref 6–20)
CHLORIDE: 105 mmol/L (ref 101–111)
CO2: 24 mmol/L (ref 22–32)
Calcium: 9.5 mg/dL (ref 8.9–10.3)
Creatinine, Ser: 0.57 mg/dL — ABNORMAL LOW (ref 0.61–1.24)
Glucose, Bld: 104 mg/dL — ABNORMAL HIGH (ref 65–99)
POTASSIUM: 4 mmol/L (ref 3.5–5.1)
SODIUM: 140 mmol/L (ref 135–145)

## 2015-12-29 LAB — CBC
HCT: 32.1 % — ABNORMAL LOW (ref 39.0–52.0)
HEMOGLOBIN: 10 g/dL — AB (ref 13.0–17.0)
MCH: 26.5 pg (ref 26.0–34.0)
MCHC: 31.2 g/dL (ref 30.0–36.0)
MCV: 85.1 fL (ref 78.0–100.0)
Platelets: 415 10*3/uL — ABNORMAL HIGH (ref 150–400)
RBC: 3.77 MIL/uL — AB (ref 4.22–5.81)
RDW: 14.1 % (ref 11.5–15.5)
WBC: 9.3 10*3/uL (ref 4.0–10.5)

## 2015-12-29 LAB — GLUCOSE, CAPILLARY: GLUCOSE-CAPILLARY: 115 mg/dL — AB (ref 65–99)

## 2015-12-29 MED ORDER — BACLOFEN 10 MG PO TABS: 10.0000 mg | ORAL_TABLET | Freq: Three times a day (TID) | ORAL | Status: DC

## 2015-12-29 MED ORDER — CEFDINIR 300 MG PO CAPS: 300.0000 mg | ORAL_CAPSULE | Freq: Two times a day (BID) | ORAL | Status: AC
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
## 2015-12-29 NOTE — Clinical Social Work Note (Signed)
Patient transferred from 5W to 5N and has been discharged back to SNF, [facility]. Patient is a LTC resident of [facility].    Clinical Social Worker has contacted pt's sister, Demere Capel [phone] in reference to discharge planning. Pt's sister and family agreeable to patient returning however did express concerns. No further concerns expressed at this time.   Clinical Social Worker facilitated patient discharge including contacting patient family and facility to confirm patient discharge plans.  Clinical information faxed to facility and family agreeable with plan.  CSW arranged ambulance transport via [facility] to [facility].  RN to call report prior to discharge.  Clinical Social Worker will sign off for now as social work intervention is no longer needed. Please consult [place] again if new need arises.  Glendon Axe, MSW, [place] [phone] 12/29/2015 12:25 PM

## 2015-12-29 NOTE — Progress Notes (Signed)
PT Cancellation Note  Patient Details Name: Phillip [name] MRN: [id number] DOB: 08/16/1966   Cancelled Treatment:    Reason Eval/Treat Not Completed: PT screened, no needs identified, will sign off (Pt seen and evaluated 1/17 with no change in pt status or recommendation since that time. Spoke with pt and CSW who both stated no questions, needs, or change acutely. Will not evaluate again with deferral of services to SNF)   [name] 12/29/2015, 12:33 PM Elwyn Reach, [place]

## 2015-12-29 NOTE — Progress Notes (Signed)
Pt d/c back to [facility] after receiving a final dose of IV Rocephin per Dr. Caron Presume order. Pt transferred via [facility] without incident. No change from AM assessment. Rept called to Nurse, adult at [facility].

## 2015-12-29 NOTE — Progress Notes (Signed)
Transferred patient to 5N21. Reported was called to [facility]. Patient was transported without any distress.

## 2015-12-29 NOTE — Plan of Care (Signed)
Problem: Discharge Progression Outcomes Goal: Independent ADLs or Home Health Care Outcome: Not Met (add Reason) Paralysis-plan to d/c back to SNF

## 2015-12-29 NOTE — Discharge Instructions (Signed)
Phillip [name], it was a pleasure taking care of you in the hospital. You were here for an infection in your blood that came from your urine. You will take a course of oral antibiotics until January 28th. Should you develop a fever >100.4, chills, or night sweats, please seek medical attention.  I have also increased your baclofen to 10 mg three times a day for spasticity. This can be increased to 20 mg three times a day if necessary.  Please wait to change the foley catheter for at least 14 days given urethral trauma, to let it heal. Last Foley change was 12/24/14. If placement of the the foley catheter leads to recurrent bleeding episodes, please make arrangements to see a urologist.

## 2015-12-29 NOTE — Progress Notes (Signed)
Pt noted to have a mepilex to sacral area and two pink mepilexes to LLE and pink mepilex to R top of foot. All these clean dry and intact. Right lateral area changed per order. Wound bed noted to have some yellow tissue. Pressure area deep with some tendon showing. Moderate amount of serousanquinous drainage. No odor noted. Area packed with aquacel and pink mepilex applied per order. Pt tolerated well. Pt has no sensation of BLE due to h/o spinal cord injury. Bil prevalon boots reapplied. Pt refuses to turn. Will continue to monitor.
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
## 2016-01-01 LAB — CULTURE, BLOOD (ROUTINE X 2)
CULTURE: NO GROWTH
Culture: NO GROWTH

## 2016-02-13 ENCOUNTER — Emergency Department (HOSPITAL_COMMUNITY): Payer: Medicaid Other

## 2016-02-13 ENCOUNTER — Inpatient Hospital Stay (HOSPITAL_COMMUNITY)
Admission: EM | Admit: 2016-02-13 | Discharge: 2016-02-19 | DRG: 871 | Disposition: A | Discharge status: Skilled Nursing Facility | Payer: Medicaid Other | Attending: Internal Medicine | Admitting: Internal Medicine

## 2016-02-13 DIAGNOSIS — L89619 Pressure ulcer of right heel, unspecified stage: Secondary | ICD-10-CM | POA: Diagnosis present

## 2016-02-13 DIAGNOSIS — R339 Retention of urine, unspecified: Secondary | ICD-10-CM | POA: Diagnosis present

## 2016-02-13 DIAGNOSIS — I10 Essential (primary) hypertension: Secondary | ICD-10-CM | POA: Diagnosis present

## 2016-02-13 DIAGNOSIS — E876 Hypokalemia: Secondary | ICD-10-CM | POA: Diagnosis present

## 2016-02-13 DIAGNOSIS — R6520 Severe sepsis without septic shock: Secondary | ICD-10-CM | POA: Diagnosis present

## 2016-02-13 DIAGNOSIS — R319 Hematuria, unspecified: Secondary | ICD-10-CM | POA: Diagnosis present

## 2016-02-13 DIAGNOSIS — G8250 Quadriplegia, unspecified: Secondary | ICD-10-CM | POA: Diagnosis present

## 2016-02-13 DIAGNOSIS — D649 Anemia, unspecified: Secondary | ICD-10-CM | POA: Diagnosis present

## 2016-02-13 DIAGNOSIS — L97509 Non-pressure chronic ulcer of other part of unspecified foot with unspecified severity: Secondary | ICD-10-CM

## 2016-02-13 DIAGNOSIS — N319 Neuromuscular dysfunction of bladder, unspecified: Secondary | ICD-10-CM | POA: Diagnosis present

## 2016-02-13 DIAGNOSIS — R52 Pain, unspecified: Secondary | ICD-10-CM

## 2016-02-13 DIAGNOSIS — N179 Acute kidney failure, unspecified: Secondary | ICD-10-CM | POA: Diagnosis present

## 2016-02-13 DIAGNOSIS — L8990 Pressure ulcer of unspecified site, unspecified stage: Secondary | ICD-10-CM | POA: Diagnosis present

## 2016-02-13 DIAGNOSIS — N390 Urinary tract infection, site not specified: Secondary | ICD-10-CM

## 2016-02-13 DIAGNOSIS — A4151 Sepsis due to Escherichia coli [E. coli]: Principal | ICD-10-CM | POA: Diagnosis present

## 2016-02-13 DIAGNOSIS — R509 Fever, unspecified: Secondary | ICD-10-CM

## 2016-02-13 DIAGNOSIS — F17210 Nicotine dependence, cigarettes, uncomplicated: Secondary | ICD-10-CM | POA: Diagnosis present

## 2016-02-13 DIAGNOSIS — R7881 Bacteremia: Secondary | ICD-10-CM | POA: Diagnosis present

## 2016-02-13 DIAGNOSIS — A419 Sepsis, unspecified organism: Secondary | ICD-10-CM | POA: Diagnosis present

## 2016-02-13 LAB — COMPREHENSIVE METABOLIC PANEL
ALK PHOS: 137 U/L — AB (ref 38–126)
ALT: 24 U/L (ref 17–63)
ANION GAP: 12 (ref 5–15)
AST: 29 U/L (ref 15–41)
Albumin: 3.7 g/dL (ref 3.5–5.0)
BILIRUBIN TOTAL: 0.6 mg/dL (ref 0.3–1.2)
BUN: 30 mg/dL — ABNORMAL HIGH (ref 6–20)
CALCIUM: 9 mg/dL (ref 8.9–10.3)
CO2: 21 mmol/L — ABNORMAL LOW (ref 22–32)
Chloride: 104 mmol/L (ref 101–111)
Creatinine, Ser: 1.64 mg/dL — ABNORMAL HIGH (ref 0.61–1.24)
GFR, EST AFRICAN AMERICAN: 56 mL/min — AB (ref >60)
GFR, EST NON AFRICAN AMERICAN: 48 mL/min — AB (ref >60)
Glucose, Bld: 135 mg/dL — ABNORMAL HIGH (ref 65–99)
POTASSIUM: 4.5 mmol/L (ref 3.5–5.1)
Sodium: 137 mmol/L (ref 135–145)
TOTAL PROTEIN: 7.6 g/dL (ref 6.5–8.1)

## 2016-02-13 LAB — CBC WITH DIFFERENTIAL/PLATELET
BASOS ABS: 0 10*3/uL (ref 0.0–0.1)
BASOS PCT: 0 %
Eosinophils Absolute: 0 10*3/uL (ref 0.0–0.7)
Eosinophils Relative: 0 %
HEMATOCRIT: 36.5 % — AB (ref 39.0–52.0)
HEMOGLOBIN: 11.5 g/dL — AB (ref 13.0–17.0)
Lymphocytes Relative: 6 %
Lymphs Abs: 1 10*3/uL (ref 0.7–4.0)
MCH: 27.8 pg (ref 26.0–34.0)
MCHC: 31.5 g/dL (ref 30.0–36.0)
MCV: 88.2 fL (ref 78.0–100.0)
MONO ABS: 1.2 10*3/uL — AB (ref 0.1–1.0)
Monocytes Relative: 7 %
NEUTROS ABS: 14.1 10*3/uL — AB (ref 1.7–7.7)
NEUTROS PCT: 87 %
Platelets: 349 10*3/uL (ref 150–400)
RBC: 4.14 MIL/uL — AB (ref 4.22–5.81)
RDW: 16 % — AB (ref 11.5–15.5)
WBC: 16.3 10*3/uL — AB (ref 4.0–10.5)

## 2016-02-13 LAB — I-STAT CG4 LACTIC ACID, ED: Lactic Acid, Venous: 2.49 mmol/L (ref 0.5–2.0)

## 2016-02-13 MED ORDER — DEXTROSE 5 % IV SOLN
2.0000 g | Freq: Once | INTRAVENOUS | Status: AC
  Administered 2016-02-13: 2 g via INTRAVENOUS
  Filled 2016-02-13: qty 2

## 2016-02-13 MED ORDER — SODIUM CHLORIDE 0.9 % IV BOLUS (SEPSIS)
500.0000 mL | INTRAVENOUS | Status: AC
  Administered 2016-02-14: 500 mL via INTRAVENOUS

## 2016-02-13 MED ORDER — SODIUM CHLORIDE 0.9 % IV BOLUS (SEPSIS)
1000.0000 mL | INTRAVENOUS | Status: AC
  Administered 2016-02-13 – 2016-02-14 (×2): 1000 mL via INTRAVENOUS

## 2016-02-13 NOTE — ED Notes (Signed)
Per EMS, Pt originally was getting transported for hemorrhage in foley but upon arrival pt was noted be hypotensive, tachycardic and hyperthermic. Staff states they noticed lack in urethra, and they removed his foley. Staff did state that he received Tylenol but unable to state the time, also  unable to advise of any color change in urine. His abdomen is pretty distended. H/o quadraplegia. Fluid bolus given of 500 ml which in turn helped with BP.  Does received Tylenol tid but unsure if he received 3rd dose tonight.

## 2016-02-13 NOTE — ED Notes (Signed)
X-ray at bedside

## 2016-02-13 NOTE — ED Provider Notes (Signed)
CSN: [id number]     Arrival date & time 02/13/16  2208 History  By signing my name below, I, Phillip [name], attest that this documentation has been prepared under the direction and in the presence of Carmin Muskrat, MD. Electronically Signed: [name], ED Scribe. 02/13/2016. 11:39 PM.   Chief Complaint  Patient presents with  . Weakness  . Fever   The history is provided by the patient. No language interpreter was used.     HPI Comments: Phillip [name] is a 50 y.o. male brought in by ambulance, with PMHx quadriplegia who presents to the Emergency Department complaining of gradual onset, constant, abdominal distension that began this morning. Pt also complains of generalized weakness, blood draining from his foley catheter, nausea, and chills. He reports that he was symptom free until this morning. Per triage report, pt was originally transported for hemorrhage in foley but upon arrival he was hypotensive, tachycardic, and hyperthermic. Pt's foley catheter was removed due to blockage. Pt denies pain of any kind. Denies vomiting, diarrhea, constipation, hematochezia, fever, rash, ulcers, wounds, confusion, lightheadedness, or any other associated symptoms. No hx UTI or bowel infection.   Past Medical History  Diagnosis Date  . DJD (degenerative joint disease)   . Seizures ([place])   . Broken hip ([place])   . Cervical spinal cord injury ([place])   . Tobacco abuse    Past Surgical History  Procedure Laterality Date  . Knee surgery      right knee surgery 1988  . Intramedullary (im) nail intertrochanteric Right 10/09/2014    Procedure: INTRAMEDULLARY (IM) NAIL INTERTROCHANTRIC Right knee aspiration;  Surgeon: Melina Schools, MD;  Location: [facility];  Service: Orthopedics;  Laterality: Right;  . Orif ankle fracture Right 05/26/2015    Procedure: OPEN REDUCTION INTERNAL FIXATION (ORIF) ANKLE FRACTURE;  Surgeon: Meredith Pel, MD;  Location: [facility];  Service: Orthopedics;  Laterality: Right;   . Posterior cervical fusion/foraminotomy N/A 05/30/2015    Procedure: C3-C7 decompressive laminectomy with posterior cervical fusion utilizing lateral mass instrumentation and local bone grafting;  Surgeon: Earnie Larsson, MD;  Location: [facility];  Service: Neurosurgery;  Laterality: N/A;  . Incision and drainage of wound N/A 08/24/2015    Procedure: IRRIGATION AND DEBRIDEMENT SACRAL DECUBITUS ULCER ;  Surgeon: Theodoro Kos, DO;  Location: [facility];  Service: Plastics;  Laterality: N/A;  . Application of a-cell of chest/abdomen N/A 08/24/2015    Procedure: PLACEMENT OF A-CELL ANd Wound  VAC;  Surgeon: Theodoro Kos, DO;  Location: [facility];  Service: Plastics;  Laterality: N/A;  . I&d extremity Right 11/11/2015    Procedure: IRRIGATION AND DEBRIDEMENT RIGHT ANKLE;  Surgeon: Meredith Pel, MD;  Location: [facility];  Service: Orthopedics;  Laterality: Right;  . Hardware removal Right 11/11/2015    Procedure: HARDWARE REMOVAL RIGHT ANKLE;  Surgeon: Meredith Pel, MD;  Location: [facility];  Service: Orthopedics;  Laterality: Right;  . Application of wound vac Right 11/11/2015    Procedure: APPLICATION OF WOUND VAC;  Surgeon: Meredith Pel, MD;  Location: [facility];  Service: Orthopedics;  Laterality: Right;   Family History  Problem Relation Age of Onset  . Hypertension Mother    Social History  Substance Use Topics  . Smoking status: Current Every Day Smoker -- 0.50 packs/day for .2 years    Types: Cigarettes  . Smokeless tobacco: Not on file  . Alcohol Use: No     Comment: Pt has not drank since inj 2 weeks ago  Review of Systems  Constitutional: Positive for fever and chills.  Respiratory: Negative for shortness of breath.   Cardiovascular: Negative for chest pain.  Gastrointestinal: Positive for nausea and abdominal pain. Negative for vomiting, diarrhea, constipation and blood in stool.  Genitourinary:       Typically has foley catheter, though not currently  Musculoskeletal: Negative  for myalgias and arthralgias.  Skin: Negative for rash and wound.       No current skin breakdown  Allergic/Immunologic: Negative for immunocompromised state.  Neurological: Positive for weakness (generalized). Negative for light-headedness.  Psychiatric/Behavioral: Negative for confusion.      Allergies  Review of patient's allergies indicates no known allergies.  Home Medications   Prior to Admission medications   Medication Sig Start Date End Date Taking? Authorizing Provider  acetaminophen (TYLENOL) 325 MG tablet Take 650 mg by mouth 3 (three) times daily.   Yes Historical Provider, MD  Amino Acids-Protein Hydrolys (FEEDING SUPPLEMENT, PRO-STAT SUGAR FREE 64,) LIQD Take 30 mLs by mouth 2 (two) times daily.   Yes Historical Provider, MD  amitriptyline (ELAVIL) 25 MG tablet Take 25 mg by mouth at bedtime.   Yes Historical Provider, MD  amLODipine (NORVASC) 5 MG tablet Take 1 tablet (5 mg total) by mouth daily. 12/02/15  Yes Collier Salina, MD  baclofen (LIORESAL) 10 MG tablet Take 1 tablet (10 mg total) by mouth 3 (three) times daily. 12/29/15  Yes Liberty Handy, MD  bisacodyl (DULCOLAX) 10 MG suppository Place 10 mg rectally every 8 (eight) hours as needed for moderate constipation.    Yes Historical Provider, MD  collagenase (SANTYL) ointment Apply 1 application topically daily. Apply to R lateral foot topically every evening shift for stage 4. Clean with NS. Apply Santyl and cover with dry dressing.   Yes Historical Provider, MD  ferrous sulfate 325 (65 FE) MG tablet Take 1 tablet (325 mg total) by mouth 2 (two) times daily with a meal. 08/26/15  Yes Ripudeep K Rai, MD  gabapentin (NEURONTIN) 300 MG capsule Take 300 mg by mouth 3 (three) times daily.   Yes Historical Provider, MD  metoprolol tartrate (LOPRESSOR) 25 MG tablet Take 0.5 tablets (12.5 mg total) by mouth 2 (two) times daily. 08/26/15  Yes Ripudeep Krystal Eaton, MD  Multiple Vitamin (MULTIVITAMIN WITH MINERALS) TABS tablet Take 1  tablet by mouth daily. 06/17/15  Yes Verlee Monte, MD  NUTRITIONAL SUPPLEMENT LIQD Take 240 mLs by mouth 3 (three) times daily.   Yes Historical Provider, MD  ondansetron (ZOFRAN) 4 MG tablet Take 1 tablet (4 mg total) by mouth every 6 (six) hours as needed for nausea. 08/26/15  Yes Ripudeep Krystal Eaton, MD  pantoprazole (PROTONIX) 40 MG tablet Take 1 tablet (40 mg total) by mouth daily. 12/02/15  Yes Collier Salina, MD  polyethylene glycol (MIRALAX / GLYCOLAX) packet Take 17 g by mouth daily.   Yes Historical Provider, MD  saccharomyces boulardii (FLORASTOR) 250 MG capsule Take 1 capsule (250 mg total) by mouth 2 (two) times daily. 06/17/15  Yes Verlee Monte, MD  vitamin B-12 1000 MCG tablet Take 1 tablet (1,000 mcg total) by mouth daily. 06/17/15  Yes Verlee Monte, MD  vitamin C (VITAMIN C) 500 MG tablet Take 1 tablet (500 mg total) by mouth 2 (two) times daily. Patient taking differently: Take 500 mg by mouth daily.  08/26/15  Yes Ripudeep Krystal Eaton, MD  zinc sulfate 220 MG capsule Take 1 capsule (220 mg total) by mouth daily. 08/26/15  Yes Ripudeep  Krystal Eaton, MD  zolpidem (AMBIEN) 5 MG tablet Take 1 tablet (5 mg total) by mouth at bedtime as needed for sleep. 08/26/15  Yes Ripudeep Krystal Eaton, MD  enoxaparin (LOVENOX) 40 MG/0.4ML injection Inject 0.4 mLs (40 mg total) into the skin daily. Patient not taking: Reported on 12/24/2015 06/03/15   Lisette Abu, PA-C   BP 95/56 mmHg  Pulse 118  Temp(Src) 100.1 F (37.8 C) (Oral)  Resp 23  SpO2 98%   Physical Exam  Constitutional:  quad  HENT:  Head: Atraumatic.  Eyes: Conjunctivae are normal. Right eye exhibits no discharge. Left eye exhibits no discharge.  Neck: No tracheal deviation present.  Cardiovascular: Tachycardia present.   No murmur heard. Pulmonary/Chest: Effort normal. No stridor. No respiratory distress.  Abdominal: Soft. Bowel sounds are normal. There is no tenderness.  Large, but not distended  Genitourinary:  Foley catheter not in place on  arrival  Musculoskeletal:  Quad - no other obvious deformities  Neurological:  Essentially quad, though he has some gross functionality of his UE.  Skin: Skin is warm and dry.  Psychiatric: He has a normal mood and affect.    ED Course  Procedures (including critical care time)  DIAGNOSTIC STUDIES: Oxygen Saturation is 97% on RA, normal by my interpretation.    COORDINATION OF CARE: 11:36 PM-Discussed treatment plan with pt at bedside and pt agreed to plan.   Labs Review Labs Reviewed  COMPREHENSIVE METABOLIC PANEL - Abnormal; Notable for the following:    CO2 21 (*)    Glucose, Bld 135 (*)    BUN 30 (*)    Creatinine, Ser 1.64 (*)    Alkaline Phosphatase 137 (*)    GFR calc non Af Amer 48 (*)    GFR calc Af Amer 56 (*)    All other components within normal limits  CBC WITH DIFFERENTIAL/PLATELET - Abnormal; Notable for the following:    WBC 16.3 (*)    RBC 4.14 (*)    Hemoglobin 11.5 (*)    HCT 36.5 (*)    RDW 16.0 (*)    Neutro Abs 14.1 (*)    Monocytes Absolute 1.2 (*)    All other components within normal limits  I-STAT CG4 LACTIC ACID, ED - Abnormal; Notable for the following:    Lactic Acid, Venous 2.49 (*)    All other components within normal limits  CULTURE, BLOOD (ROUTINE X 2)  CULTURE, BLOOD (ROUTINE X 2)  URINE CULTURE  URINALYSIS, ROUTINE W REFLEX MICROSCOPIC (NOT AT [facility])    Imaging Review Dg Chest Port 1 View  02/13/2016  CLINICAL DATA:  Fever, chills, nausea.  Question sepsis. EXAM: PORTABLE CHEST 1 VIEW COMPARISON:  Radiographs 12/25/2015 FINDINGS: The cardiomediastinal contours are normal. The lungs are clear. Pulmonary vasculature is normal. No consolidation, pleural effusion, or pneumothorax. No acute osseous abnormalities are seen. IMPRESSION: No acute pulmonary process. Electronically Signed   By: Jeb Levering M.D.   On: 02/13/2016 23:57   Patient was initially febrile, tachycardic, and was concern for sepsis he received empiric fluids,  antibiotics. Patient had copious blood and clots from his penis, and initial attempts to secure Foley catheter were unsuccessful.  Bedside ultrasound demonstrates greater than 600 mL fluid in his bladder. Additional attempt to secure Foley catheter was successful, patient now producing additional urine, substantial blood.  Beyond initial urinary tract infection, patient also found to have lactic acidosis, leukocytosis and acute kidney injury, with doubling of his creatinine  On repeat exam, patient  remains in similar condition, tachycardic, febrile  MDM   I personally performed the services described in this documentation, which was scribed in my presence. The recorded information has been reviewed and is accurate.   Quadriplegic presents with concern for grossly bloody urine from his facility. Notably, the patient is also febrile, tachycardic, tachypneic, with mild hypotension on arrival. Patient found to be septic, with lactic acidosis, leukocytosis, acute kidney injury, significant for sepsis with end organ effect. Patient received fluid resuscitation, per protocol, antibiotics. Given concern for sepsis, the patient reported admission for further evaluation and management.  CRITICAL CARE Performed by: Carmin Muskrat Total critical care time: 45 minutes Critical care time was exclusive of separately billable procedures and treating other patients. Critical care was necessary to treat or prevent imminent or life-threatening deterioration. Critical care was time spent personally by me on the following activities: development of treatment plan with patient and/or surrogate as well as nursing, discussions with consultants, evaluation of patient's response to treatment, examination of patient, obtaining history from patient or surrogate, ordering and performing treatments and interventions, ordering and review of laboratory studies, ordering and review of radiographic studies, pulse oximetry  and re-evaluation of patient's condition.   Carmin Muskrat, MD 02/14/16 [phone]

## 2016-02-14 ENCOUNTER — Inpatient Hospital Stay (HOSPITAL_COMMUNITY): Payer: Medicaid Other

## 2016-02-14 ENCOUNTER — Encounter (HOSPITAL_COMMUNITY): Payer: Self-pay | Admitting: *Deleted

## 2016-02-14 DIAGNOSIS — L8990 Pressure ulcer of unspecified site, unspecified stage: Secondary | ICD-10-CM | POA: Diagnosis not present

## 2016-02-14 DIAGNOSIS — E876 Hypokalemia: Secondary | ICD-10-CM | POA: Diagnosis present

## 2016-02-14 DIAGNOSIS — R319 Hematuria, unspecified: Secondary | ICD-10-CM | POA: Diagnosis present

## 2016-02-14 DIAGNOSIS — D649 Anemia, unspecified: Secondary | ICD-10-CM | POA: Diagnosis present

## 2016-02-14 DIAGNOSIS — L89619 Pressure ulcer of right heel, unspecified stage: Secondary | ICD-10-CM | POA: Diagnosis present

## 2016-02-14 DIAGNOSIS — T83511A Infection and inflammatory reaction due to indwelling urethral catheter, initial encounter: Secondary | ICD-10-CM | POA: Diagnosis not present

## 2016-02-14 DIAGNOSIS — N390 Urinary tract infection, site not specified: Secondary | ICD-10-CM

## 2016-02-14 DIAGNOSIS — A419 Sepsis, unspecified organism: Secondary | ICD-10-CM | POA: Diagnosis not present

## 2016-02-14 DIAGNOSIS — R6520 Severe sepsis without septic shock: Secondary | ICD-10-CM | POA: Diagnosis present

## 2016-02-14 DIAGNOSIS — F17210 Nicotine dependence, cigarettes, uncomplicated: Secondary | ICD-10-CM | POA: Diagnosis present

## 2016-02-14 DIAGNOSIS — R339 Retention of urine, unspecified: Secondary | ICD-10-CM | POA: Diagnosis present

## 2016-02-14 DIAGNOSIS — N319 Neuromuscular dysfunction of bladder, unspecified: Secondary | ICD-10-CM | POA: Diagnosis present

## 2016-02-14 DIAGNOSIS — N179 Acute kidney failure, unspecified: Secondary | ICD-10-CM | POA: Diagnosis present

## 2016-02-14 DIAGNOSIS — R7881 Bacteremia: Secondary | ICD-10-CM | POA: Diagnosis not present

## 2016-02-14 DIAGNOSIS — A4151 Sepsis due to Escherichia coli [E. coli]: Secondary | ICD-10-CM | POA: Diagnosis present

## 2016-02-14 DIAGNOSIS — B390 Acute pulmonary histoplasmosis capsulati: Secondary | ICD-10-CM | POA: Diagnosis not present

## 2016-02-14 DIAGNOSIS — B9620 Unspecified Escherichia coli [E. coli] as the cause of diseases classified elsewhere: Secondary | ICD-10-CM | POA: Diagnosis not present

## 2016-02-14 DIAGNOSIS — I10 Essential (primary) hypertension: Secondary | ICD-10-CM | POA: Diagnosis present

## 2016-02-14 DIAGNOSIS — R531 Weakness: Secondary | ICD-10-CM | POA: Diagnosis present

## 2016-02-14 DIAGNOSIS — G8250 Quadriplegia, unspecified: Secondary | ICD-10-CM | POA: Diagnosis present

## 2016-02-14 LAB — URINE MICROSCOPIC-ADD ON: SQUAMOUS EPITHELIAL / LPF: NONE SEEN

## 2016-02-14 LAB — BASIC METABOLIC PANEL
ANION GAP: 10 (ref 5–15)
BUN: 24 mg/dL — ABNORMAL HIGH (ref 6–20)
CALCIUM: 8 mg/dL — AB (ref 8.9–10.3)
CO2: 19 mmol/L — ABNORMAL LOW (ref 22–32)
Chloride: 108 mmol/L (ref 101–111)
Creatinine, Ser: 0.9 mg/dL (ref 0.61–1.24)
GLUCOSE: 98 mg/dL (ref 65–99)
Potassium: 3.7 mmol/L (ref 3.5–5.1)
Sodium: 137 mmol/L (ref 135–145)

## 2016-02-14 LAB — URINALYSIS, ROUTINE W REFLEX MICROSCOPIC
Glucose, UA: NEGATIVE mg/dL
Ketones, ur: 15 mg/dL — AB
NITRITE: POSITIVE — AB
PH: 7 (ref 5.0–8.0)
Protein, ur: >300 mg/dL — AB
SPECIFIC GRAVITY, URINE: 1.019 (ref 1.005–1.030)

## 2016-02-14 LAB — CBC
HCT: 27.1 % — ABNORMAL LOW (ref 39.0–52.0)
HEMOGLOBIN: 8.6 g/dL — AB (ref 13.0–17.0)
MCH: 28.2 pg (ref 26.0–34.0)
MCHC: 31.7 g/dL (ref 30.0–36.0)
MCV: 88.9 fL (ref 78.0–100.0)
PLATELETS: 258 10*3/uL (ref 150–400)
RBC: 3.05 MIL/uL — ABNORMAL LOW (ref 4.22–5.81)
RDW: 16.2 % — ABNORMAL HIGH (ref 11.5–15.5)
WBC: 8 10*3/uL (ref 4.0–10.5)

## 2016-02-14 LAB — MRSA PCR SCREENING: MRSA BY PCR: NEGATIVE

## 2016-02-14 MED ORDER — HEPARIN SODIUM (PORCINE) 5000 UNIT/ML IJ SOLN: 5000.0000 [IU] | Freq: Three times a day (TID) | INTRAMUSCULAR | Status: DC

## 2016-02-14 MED ORDER — SODIUM CHLORIDE 0.9 % IV BOLUS (SEPSIS)
1000.0000 mL | Freq: Once | INTRAVENOUS | Status: AC
  Administered 2016-02-14: 1000 mL via INTRAVENOUS

## 2016-02-14 MED ORDER — PANTOPRAZOLE SODIUM 40 MG PO TBEC
40.0000 mg | DELAYED_RELEASE_TABLET | Freq: Every day | ORAL | Status: DC
  Administered 2016-02-14 – 2016-02-19 (×6): 40 mg via ORAL
  Filled 2016-02-14 (×6): qty 1

## 2016-02-14 MED ORDER — POLYETHYLENE GLYCOL 3350 17 G PO PACK
17.0000 g | PACK | Freq: Every day | ORAL | Status: DC
  Administered 2016-02-17 – 2016-02-19 (×2): 17 g via ORAL
  Filled 2016-02-14 (×4): qty 1

## 2016-02-14 MED ORDER — ADULT MULTIVITAMIN W/MINERALS CH
1.0000 | ORAL_TABLET | Freq: Every day | ORAL | Status: DC
  Administered 2016-02-14 – 2016-02-19 (×6): 1 via ORAL
  Filled 2016-02-14 (×6): qty 1

## 2016-02-14 MED ORDER — AMLODIPINE BESYLATE 5 MG PO TABS: 5.0000 mg | ORAL_TABLET | Freq: Every day | ORAL | Status: DC

## 2016-02-14 MED ORDER — COLLAGENASE 250 UNIT/GM EX OINT
1.0000 "application " | TOPICAL_OINTMENT | Freq: Every day | CUTANEOUS | Status: DC
  Administered 2016-02-14 – 2016-02-19 (×5): 1 via TOPICAL
  Filled 2016-02-14: qty 30

## 2016-02-14 MED ORDER — PIPERACILLIN-TAZOBACTAM 3.375 G IVPB
3.3750 g | Freq: Three times a day (TID) | INTRAVENOUS | Status: DC
  Administered 2016-02-14 – 2016-02-16 (×6): 3.375 g via INTRAVENOUS
  Filled 2016-02-14 (×6): qty 50

## 2016-02-14 MED ORDER — ZOLPIDEM TARTRATE 5 MG PO TABS
5.0000 mg | ORAL_TABLET | Freq: Every evening | ORAL | Status: DC | PRN
  Administered 2016-02-14 – 2016-02-18 (×5): 5 mg via ORAL
  Filled 2016-02-14 (×5): qty 1

## 2016-02-14 MED ORDER — BACLOFEN 10 MG PO TABS
10.0000 mg | ORAL_TABLET | Freq: Three times a day (TID) | ORAL | Status: DC
  Administered 2016-02-14 – 2016-02-19 (×17): 10 mg via ORAL
  Filled 2016-02-14 (×18): qty 1

## 2016-02-14 MED ORDER — SACCHAROMYCES BOULARDII 250 MG PO CAPS
250.0000 mg | ORAL_CAPSULE | Freq: Two times a day (BID) | ORAL | Status: DC
  Administered 2016-02-14 – 2016-02-19 (×11): 250 mg via ORAL
  Filled 2016-02-14 (×13): qty 1

## 2016-02-14 MED ORDER — METOPROLOL TARTRATE 1 MG/ML IV SOLN
5.0000 mg | Freq: Four times a day (QID) | INTRAVENOUS | Status: DC | PRN
  Administered 2016-02-14: 5 mg via INTRAVENOUS
  Filled 2016-02-14 (×3): qty 5

## 2016-02-14 MED ORDER — VANCOMYCIN HCL 10 G IV SOLR
1250.0000 mg | Freq: Two times a day (BID) | INTRAVENOUS | Status: DC
  Administered 2016-02-14 – 2016-02-16 (×5): 1250 mg via INTRAVENOUS
  Filled 2016-02-14: qty 750
  Filled 2016-02-14 (×4): qty 1250

## 2016-02-14 MED ORDER — ZINC SULFATE 220 (50 ZN) MG PO CAPS
220.0000 mg | ORAL_CAPSULE | Freq: Every day | ORAL | Status: DC
  Administered 2016-02-14 – 2016-02-19 (×6): 220 mg via ORAL
  Filled 2016-02-14 (×6): qty 1

## 2016-02-14 MED ORDER — DEXTROSE 5 % IV SOLN: 2.0000 g | INTRAVENOUS | Status: DC

## 2016-02-14 MED ORDER — SIMETHICONE 40 MG/0.6ML PO SUSP
40.0000 mg | Freq: Four times a day (QID) | ORAL | Status: DC | PRN
  Administered 2016-02-14: 40 mg via ORAL
  Filled 2016-02-14 (×3): qty 0.6

## 2016-02-14 MED ORDER — FERROUS SULFATE 325 (65 FE) MG PO TABS
325.0000 mg | ORAL_TABLET | Freq: Two times a day (BID) | ORAL | Status: DC
  Administered 2016-02-14 – 2016-02-19 (×11): 325 mg via ORAL
  Filled 2016-02-14 (×12): qty 1

## 2016-02-14 MED ORDER — GABAPENTIN 300 MG PO CAPS
300.0000 mg | ORAL_CAPSULE | Freq: Three times a day (TID) | ORAL | Status: DC
  Administered 2016-02-14 – 2016-02-19 (×17): 300 mg via ORAL
  Filled 2016-02-14 (×18): qty 1

## 2016-02-14 MED ORDER — METOPROLOL TARTRATE 25 MG PO TABS: 12.5000 mg | ORAL_TABLET | Freq: Two times a day (BID) | ORAL | Status: DC

## 2016-02-14 MED ORDER — BISACODYL 10 MG RE SUPP: 10.0000 mg | Freq: Three times a day (TID) | RECTAL | Status: DC | PRN

## 2016-02-14 MED ORDER — DEXTROSE 5 % IV SOLN: 1.0000 g | INTRAVENOUS | Status: DC

## 2016-02-14 MED ORDER — PRO-STAT SUGAR FREE PO LIQD
30.0000 mL | Freq: Two times a day (BID) | ORAL | Status: DC
  Administered 2016-02-14 – 2016-02-18 (×3): 30 mL via ORAL
  Filled 2016-02-14 (×10): qty 30

## 2016-02-14 MED ORDER — ACETAMINOPHEN 500 MG PO TABS
1000.0000 mg | ORAL_TABLET | Freq: Once | ORAL | Status: AC
  Administered 2016-02-14: 1000 mg via ORAL
  Filled 2016-02-14: qty 2

## 2016-02-14 MED ORDER — ACETAMINOPHEN 325 MG PO TABS
650.0000 mg | ORAL_TABLET | Freq: Three times a day (TID) | ORAL | Status: DC
  Administered 2016-02-14 – 2016-02-19 (×17): 650 mg via ORAL
  Filled 2016-02-14 (×19): qty 2

## 2016-02-14 MED ORDER — ONDANSETRON HCL 4 MG PO TABS: 4.0000 mg | ORAL_TABLET | Freq: Four times a day (QID) | ORAL | Status: DC | PRN

## 2016-02-14 MED ORDER — AMITRIPTYLINE HCL 25 MG PO TABS
25.0000 mg | ORAL_TABLET | Freq: Every day | ORAL | Status: DC
  Administered 2016-02-14 – 2016-02-18 (×5): 25 mg via ORAL
  Filled 2016-02-14 (×6): qty 1

## 2016-02-14 MED ORDER — ENSURE ENLIVE PO LIQD
240.0000 mL | Freq: Three times a day (TID) | ORAL | Status: DC
  Administered 2016-02-14 – 2016-02-19 (×14): 240 mL via ORAL

## 2016-02-14 MED ORDER — HYDRALAZINE HCL 20 MG/ML IJ SOLN: 10.0000 mg | Freq: Four times a day (QID) | INTRAMUSCULAR | Status: DC | PRN

## 2016-02-14 MED ORDER — SODIUM CHLORIDE 0.9 % IV BOLUS (SEPSIS)
1000.0000 mL | Freq: Once | INTRAVENOUS | Status: AC
Start: 2016-02-14 — End: 2016-02-14
  Administered 2016-02-14: 1000 mL via INTRAVENOUS

## 2016-02-14 MED ORDER — SODIUM CHLORIDE 0.9 % IV SOLN
INTRAVENOUS | Status: DC
  Administered 2016-02-14 – 2016-02-15 (×4): via INTRAVENOUS

## 2016-02-14 MED ORDER — HYDRALAZINE HCL 20 MG/ML IJ SOLN
10.0000 mg | Freq: Once | INTRAMUSCULAR | Status: AC
  Administered 2016-02-14: 10 mg via INTRAVENOUS
  Filled 2016-02-14: qty 1

## 2016-02-14 NOTE — Care Management Note (Signed)
Case Management Note  Patient Details  Name: YUJI GISMONDI MRN: [id number] Date of Birth: October 07, 1966  Subjective/Objective:                sepsis    Action/Plan:Date:  February 14, 2016 Chart reviewed for concurrent status and case management needs. Will continue to follow patient for changes and needs: Velva Harman, BSN, RN, [place]   [phone]   Expected Discharge Date:  02/17/16               Expected Discharge Plan:  Home/Self Care  In-House Referral:  NA  Discharge planning Services  CM Consult  Post Acute Care Choice:  NA Choice offered to:  NA  DME Arranged:  N/A DME Agency:  NA  HH Arranged:  NA HH Agency:  NA  Status of Service:  In process, will continue to follow  Medicare Important Message Given:    Date Medicare IM Given:    Medicare IM give by:    Date Additional Medicare IM Given:    Additional Medicare Important Message give by:     If discussed at [place] of Stay Meetings, dates discussed:    Additional Comments:  Leeroy Cha, RN 02/14/2016, 10:32 AM
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
## 2016-02-14 NOTE — Progress Notes (Signed)
Results from blood cultures called to this RN showing gram negative rods grown in both aerobic and anerobic bottles. These finding are consestant with previous results and pt is currently being treated. Not called to MD on call however it is noted at this time. Will continue to monitor.
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
## 2016-02-14 NOTE — Progress Notes (Signed)
Pharmacy Antibiotic Note  Phillip [name] is a 50 y.o. male admitted on 02/13/2016 with sepsis and UTI.  Pharmacy has been consulted for Ceftriaxone dosing.  First dose of antibiotics already given in ED.    Plan: Ceftriaxone 2gm iv q24hr     Temp (24hrs), Avg:101.8 F (38.8 C), Min:100.1 F (37.8 C), Max:102.6 F (39.2 C)   Recent Labs Lab 02/13/16 2238 02/13/16 2249  WBC 16.3*  --   CREATININE 1.64*  --   LATICACIDVEN  --  2.49*    CrCl cannot be calculated (Unknown ideal weight.).    No Known Allergies  Antimicrobials this admission: Ceftriaxone 3/6  Thank you for allowing pharmacy to be a part of this patient's care.  Phillip [name] 02/14/2016 5:05 AM

## 2016-02-14 NOTE — Progress Notes (Signed)
CRITICAL VALUE ALERT  Critical value received:  Anaerobic Bottle Gram Neg rods  Date of notification:  02/14/16  Time of notification: 1203  Critical value read back:  Yes  Nurse who received alert:  Tilda Franco, RN  MD notified (1st page):  Elgergawy  Time of first page:  1204  MD notified (2nd page):  Time of second page:  Responding MD:    Time MD responded:

## 2016-02-14 NOTE — Progress Notes (Signed)
Nutrition Brief Note  Patient identified on the [place] Report  Wt Readings from Last 15 Encounters:  02/14/16 176 lb 5.9 oz (80 kg)  12/29/15 151 lb 8 oz (68.72 kg)  12/02/15 153 lb (69.4 kg)  11/10/15 155 lb 10.3 oz (70.6 kg)  08/24/15 147 lb (66.679 kg)  06/10/15 170 lb 6.4 oz (77.293 kg)  06/06/15 157 lb (71.215 kg)  06/03/15 182 lb 8.7 oz (82.8 kg)  10/10/14 168 lb 3.4 oz (76.3 kg)  09/06/14 170 lb (77.111 kg)  08/24/14 170 lb (77.111 kg)  11/24/12 175 lb (79.379 kg)  09/15/12 175 lb (79.379 kg)    Body mass index is 26.03 kg/(m^2). Patient meets criteria for overweight based on current BMI.   Has no complaints of nausea/vomiting/diarrhea/constipation No weight loss No chewing or swallowing problems Good appetite  Current diet order is regular, patient is consuming approximately 100% of meals at this time. Labs and medications reviewed.   No nutrition interventions warranted at this time. If nutrition issues arise, please consult RD.   Phillip [name]. Phillip Durkin, MS, RD LDN After Hours/Weekend Pager [phone]
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
## 2016-02-14 NOTE — ED Notes (Addendum)
Attempted to place 3 way foley catheter and irrigate bladder. Clots were too large and coagulated in tube. Foley taken out after some time trying to irrigate and tube immediately clotting off again. Pt is able to pass urine on his own at this time.

## 2016-02-14 NOTE — ED Notes (Signed)
Pt's HR not 264. Bad data validated

## 2016-02-15 DIAGNOSIS — L8990 Pressure ulcer of unspecified site, unspecified stage: Secondary | ICD-10-CM

## 2016-02-15 DIAGNOSIS — R7881 Bacteremia: Secondary | ICD-10-CM

## 2016-02-15 DIAGNOSIS — R6520 Severe sepsis without septic shock: Secondary | ICD-10-CM

## 2016-02-15 DIAGNOSIS — N179 Acute kidney failure, unspecified: Secondary | ICD-10-CM

## 2016-02-15 LAB — BASIC METABOLIC PANEL
Anion gap: 6 (ref 5–15)
BUN: 14 mg/dL (ref 6–20)
CALCIUM: 8.2 mg/dL — AB (ref 8.9–10.3)
CO2: 22 mmol/L (ref 22–32)
CREATININE: 0.81 mg/dL (ref 0.61–1.24)
Chloride: 114 mmol/L — ABNORMAL HIGH (ref 101–111)
GFR calc Af Amer: >60 mL/min (ref >60)
GLUCOSE: 139 mg/dL — AB (ref 65–99)
Potassium: 3.9 mmol/L (ref 3.5–5.1)
Sodium: 142 mmol/L (ref 135–145)

## 2016-02-15 LAB — CBC
HCT: 24.6 % — ABNORMAL LOW (ref 39.0–52.0)
Hemoglobin: 7.7 g/dL — ABNORMAL LOW (ref 13.0–17.0)
MCH: 27.7 pg (ref 26.0–34.0)
MCHC: 31.3 g/dL (ref 30.0–36.0)
MCV: 88.5 fL (ref 78.0–100.0)
PLATELETS: 205 10*3/uL (ref 150–400)
RBC: 2.78 MIL/uL — ABNORMAL LOW (ref 4.22–5.81)
RDW: 16.1 % — AB (ref 11.5–15.5)
WBC: 9.7 10*3/uL (ref 4.0–10.5)

## 2016-02-15 LAB — URINE CULTURE: CULTURE: NO GROWTH

## 2016-02-15 LAB — LACTIC ACID, PLASMA: LACTIC ACID, VENOUS: 1 mmol/L (ref 0.5–2.0)

## 2016-02-15 LAB — OCCULT BLOOD X 1 CARD TO LAB, STOOL: Fecal Occult Bld: NEGATIVE

## 2016-02-15 MED ORDER — ACETAMINOPHEN 325 MG PO TABS
650.0000 mg | ORAL_TABLET | Freq: Once | ORAL | Status: AC
  Administered 2016-02-15: 650 mg via ORAL

## 2016-02-15 MED ORDER — METOPROLOL TARTRATE 1 MG/ML IV SOLN
2.5000 mg | Freq: Four times a day (QID) | INTRAVENOUS | Status: DC
  Administered 2016-02-15 – 2016-02-16 (×3): 2.5 mg via INTRAVENOUS
  Filled 2016-02-15 (×3): qty 5

## 2016-02-15 MED ORDER — SODIUM CHLORIDE 0.9 % IV BOLUS (SEPSIS)
1000.0000 mL | Freq: Once | INTRAVENOUS | Status: AC
  Administered 2016-02-15: 1000 mL via INTRAVENOUS

## 2016-02-15 MED ORDER — SODIUM CHLORIDE 0.9 % IV BOLUS (SEPSIS)
500.0000 mL | Freq: Once | INTRAVENOUS | Status: AC
  Administered 2016-02-15: 500 mL via INTRAVENOUS

## 2016-02-15 NOTE — Consult Note (Signed)
WOC wound consult note Reason for Consult: Unstageable pressure injury to right heel. Healed sacral wound intact as this time with foam dressing in place, abrasion to right dorsal foot and lateral foot and below right knee Wound type: Unstageable pressure injury Pressure Ulcer POA: Yes Measurement: 3.5 cm x 4.5 cm x UTD Wound [id number] tissue Drainage (amount, consistency, odor) Minimal serosanguinous, musky odor Periwound: intact Dressing procedure/placement/frequency: Cleanse right lateral heel wound with normal saline, pat dry and apply Santyl ointment to wound bed, cover with moist 4 x4 and secure Kerlix and tape. Change dressing daily Silicone foam to abrasions to right dorsal foot and knee and left lateral foot. Change every three days and prn Bilateral Prevalon boots while in bed
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
## 2016-02-16 ENCOUNTER — Inpatient Hospital Stay (HOSPITAL_COMMUNITY): Payer: Medicaid Other

## 2016-02-16 DIAGNOSIS — A419 Sepsis, unspecified organism: Secondary | ICD-10-CM

## 2016-02-16 DIAGNOSIS — N319 Neuromuscular dysfunction of bladder, unspecified: Secondary | ICD-10-CM

## 2016-02-16 DIAGNOSIS — R319 Hematuria, unspecified: Secondary | ICD-10-CM

## 2016-02-16 DIAGNOSIS — L89610 Pressure ulcer of right heel, unstageable: Secondary | ICD-10-CM

## 2016-02-16 DIAGNOSIS — Y846 Urinary catheterization as the cause of abnormal reaction of the patient, or of later complication, without mention of misadventure at the time of the procedure: Secondary | ICD-10-CM

## 2016-02-16 DIAGNOSIS — B9620 Unspecified Escherichia coli [E. coli] as the cause of diseases classified elsewhere: Secondary | ICD-10-CM

## 2016-02-16 DIAGNOSIS — T83511A Infection and inflammatory reaction due to indwelling urethral catheter, initial encounter: Secondary | ICD-10-CM

## 2016-02-16 DIAGNOSIS — B390 Acute pulmonary histoplasmosis capsulati: Secondary | ICD-10-CM

## 2016-02-16 DIAGNOSIS — G8250 Quadriplegia, unspecified: Secondary | ICD-10-CM

## 2016-02-16 LAB — CBC
HCT: 25.1 % — ABNORMAL LOW (ref 39.0–52.0)
Hemoglobin: 7.9 g/dL — ABNORMAL LOW (ref 13.0–17.0)
MCH: 27.6 pg (ref 26.0–34.0)
MCHC: 31.5 g/dL (ref 30.0–36.0)
MCV: 87.8 fL (ref 78.0–100.0)
PLATELETS: 221 10*3/uL (ref 150–400)
RBC: 2.86 MIL/uL — ABNORMAL LOW (ref 4.22–5.81)
RDW: 15.8 % — AB (ref 11.5–15.5)
WBC: 8 10*3/uL (ref 4.0–10.5)

## 2016-02-16 LAB — BASIC METABOLIC PANEL
Anion gap: 10 (ref 5–15)
BUN: 9 mg/dL (ref 6–20)
CHLORIDE: 113 mmol/L — AB (ref 101–111)
CO2: 24 mmol/L (ref 22–32)
CREATININE: 0.59 mg/dL — AB (ref 0.61–1.24)
Calcium: 9.1 mg/dL (ref 8.9–10.3)
GFR calc Af Amer: >60 mL/min (ref >60)
GFR calc non Af Amer: >60 mL/min (ref >60)
Glucose, Bld: 103 mg/dL — ABNORMAL HIGH (ref 65–99)
Potassium: 3.4 mmol/L — ABNORMAL LOW (ref 3.5–5.1)
SODIUM: 147 mmol/L — AB (ref 135–145)

## 2016-02-16 LAB — CULTURE, BLOOD (ROUTINE X 2)

## 2016-02-16 LAB — ECHOCARDIOGRAM COMPLETE
Height: 69 in
Weight: 2821.89 oz

## 2016-02-16 MED ORDER — DEXTROSE 5 % IV SOLN
2.0000 g | INTRAVENOUS | Status: DC
  Administered 2016-02-16 – 2016-02-19 (×4): 2 g via INTRAVENOUS
  Filled 2016-02-16 (×5): qty 2

## 2016-02-16 MED ORDER — SODIUM CHLORIDE 0.9 % IV SOLN
500.0000 mg | Freq: Four times a day (QID) | INTRAVENOUS | Status: DC
  Administered 2016-02-16: 500 mg via INTRAVENOUS
  Filled 2016-02-16: qty 500

## 2016-02-16 MED ORDER — METOPROLOL TARTRATE 1 MG/ML IV SOLN
5.0000 mg | Freq: Four times a day (QID) | INTRAVENOUS | Status: DC
  Administered 2016-02-16 – 2016-02-17 (×5): 5 mg via INTRAVENOUS
  Filled 2016-02-16 (×4): qty 5

## 2016-02-16 MED ORDER — SODIUM CHLORIDE 0.45 % IV SOLN
INTRAVENOUS | Status: DC
  Administered 2016-02-16 (×2): via INTRAVENOUS

## 2016-02-16 NOTE — Progress Notes (Signed)
Echocardiogram 2D Echocardiogram has been performed.  Phillip [name] 02/16/2016, 2:48 PM
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
## 2016-02-17 DIAGNOSIS — N390 Urinary tract infection, site not specified: Secondary | ICD-10-CM

## 2016-02-17 LAB — BASIC METABOLIC PANEL
Anion gap: 7 (ref 5–15)
BUN: 7 mg/dL (ref 6–20)
CHLORIDE: 106 mmol/L (ref 101–111)
CO2: 24 mmol/L (ref 22–32)
CREATININE: 0.5 mg/dL — AB (ref 0.61–1.24)
Calcium: 8.5 mg/dL — ABNORMAL LOW (ref 8.9–10.3)
GFR calc Af Amer: >60 mL/min (ref >60)
GFR calc non Af Amer: >60 mL/min (ref >60)
GLUCOSE: 101 mg/dL — AB (ref 65–99)
Potassium: 3 mmol/L — ABNORMAL LOW (ref 3.5–5.1)
Sodium: 146 mmol/L — ABNORMAL HIGH (ref 135–145)

## 2016-02-17 LAB — CBC
HCT: 26.9 % — ABNORMAL LOW (ref 39.0–52.0)
Hemoglobin: 8.6 g/dL — ABNORMAL LOW (ref 13.0–17.0)
MCH: 27.7 pg (ref 26.0–34.0)
MCHC: 32 g/dL (ref 30.0–36.0)
MCV: 86.5 fL (ref 78.0–100.0)
PLATELETS: 286 10*3/uL (ref 150–400)
RBC: 3.11 MIL/uL — AB (ref 4.22–5.81)
RDW: 15.3 % (ref 11.5–15.5)
WBC: 6.1 10*3/uL (ref 4.0–10.5)

## 2016-02-17 MED ORDER — METOPROLOL TARTRATE 25 MG PO TABS
12.5000 mg | ORAL_TABLET | Freq: Two times a day (BID) | ORAL | Status: DC
  Administered 2016-02-17 – 2016-02-19 (×4): 12.5 mg via ORAL
  Filled 2016-02-17 (×4): qty 1

## 2016-02-17 MED ORDER — POTASSIUM CHLORIDE CRYS ER 20 MEQ PO TBCR
40.0000 meq | EXTENDED_RELEASE_TABLET | ORAL | Status: AC
  Administered 2016-02-17 (×2): 40 meq via ORAL
  Filled 2016-02-17 (×2): qty 2

## 2016-02-17 MED ORDER — BISACODYL 10 MG RE SUPP: 10.0000 mg | Freq: Every day | RECTAL | Status: DC | PRN

## 2016-02-17 NOTE — Progress Notes (Signed)
Date:  February 17, 2016 Chart reviewed for concurrent status and case management needs. Will continue to follow patient for changes and needs: Velva Harman, BSN, [place], [place]   [phone]

## 2016-02-17 NOTE — Progress Notes (Signed)
INFECTIOUS DISEASE PROGRESS NOTE  ID: Phillip [name] is a 50 y.o. male with  Principal Problem:   Sepsis secondary to UTI [facility]) Active Problems:   Quadriplegia ([place])   Acute kidney injury (nontraumatic) ([place])   Pressure ulcer   Severe sepsis with acute organ dysfunction ([place])   Hematuria   Urinary retention  Subjective: Without complaints.   Abtx:  Anti-infectives    Start     Dose/Rate Route Frequency Ordered Stop   02/16/16 1600  cefTRIAXone (ROCEPHIN) 2 g in dextrose 5 % 50 mL IVPB     2 g 100 mL/hr over 30 Minutes Intravenous Every 24 hours 02/16/16 1331     02/16/16 1000  imipenem-cilastatin (PRIMAXIN) 500 mg in sodium chloride 0.9 % 100 mL IVPB  Status:  Discontinued     500 mg 200 mL/hr over 30 Minutes Intravenous 4 times per day 02/16/16 0837 02/16/16 1331   02/14/16 2300  cefTRIAXone (ROCEPHIN) 1 g in dextrose 5 % 50 mL IVPB  Status:  Discontinued     1 g 100 mL/hr over 30 Minutes Intravenous Every 24 hours 02/14/16 0302 02/14/16 0505   02/14/16 2200  cefTRIAXone (ROCEPHIN) 2 g in dextrose 5 % 50 mL IVPB  Status:  Discontinued     2 g 100 mL/hr over 30 Minutes Intravenous Every 24 hours 02/14/16 0505 02/14/16 0905   02/14/16 1000  vancomycin (VANCOCIN) 1,250 mg in sodium chloride 0.9 % 250 mL IVPB  Status:  Discontinued     1,250 mg 166.7 mL/hr over 90 Minutes Intravenous Every 12 hours 02/14/16 0924 02/16/16 1331   02/14/16 1000  piperacillin-tazobactam (ZOSYN) IVPB 3.375 g  Status:  Discontinued     3.375 g 12.5 mL/hr over 240 Minutes Intravenous Every 8 hours 02/14/16 0924 02/16/16 0815   02/13/16 2345  cefTRIAXone (ROCEPHIN) 2 g in dextrose 5 % 50 mL IVPB     2 g 100 mL/hr over 30 Minutes Intravenous  Once 02/13/16 2339 02/14/16 0019      Medications:  Scheduled: . acetaminophen  650 mg Oral TID  . amitriptyline  25 mg Oral QHS  . baclofen  10 mg Oral TID  . cefTRIAXone (ROCEPHIN)  IV  2 g Intravenous Q24H  . collagenase  1 application Topical  Daily  . feeding supplement (ENSURE ENLIVE)  240 mL Oral TID  . feeding supplement (PRO-STAT SUGAR FREE 64)  30 mL Oral BID  . ferrous sulfate  325 mg Oral BID WC  . gabapentin  300 mg Oral TID  . metoprolol tartrate  12.5 mg Oral BID  . multivitamin with minerals  1 tablet Oral Daily  . pantoprazole  40 mg Oral Daily  . polyethylene glycol  17 g Oral Daily  . potassium chloride  40 mEq Oral Q4H  . saccharomyces boulardii  250 mg Oral BID  . zinc sulfate  220 mg Oral Daily    Objective: Vital signs in last 24 hours: Temp:  [97.4 F (36.3 C)-99.3 F (37.4 C)] 98.8 F (37.1 C) (03/10 0800) Pulse Rate:  [90-112] 99 (03/10 1000) Resp:  [17-37] 27 (03/10 1000) BP: (120-162)/(66-102) 140/88 mmHg (03/10 1000) SpO2:  [94 %-99 %] 97 % (03/10 1000)   General appearance: alert, cooperative and no distress Resp: clear to auscultation bilaterally Cardio: regular rate and rhythm GI: normal findings: bowel sounds normal and soft, non-tender Extremities: RLE wrapped  Lab Results  Recent Labs  02/16/16 0303 02/17/16 0305  WBC 8.0 6.1  HGB 7.9*  8.6*  HCT 25.1* 26.9*  NA 147* 137  K 3.4* 3.0*  CL 113* 106  CO2 24 24  BUN 9 7  CREATININE 0.59* 0.50*   Liver Panel No results for input(s): PROT, ALBUMIN, AST, ALT, ALKPHOS, BILITOT, BILIDIR, IBILI in the last 72 hours. Sedimentation Rate No results for input(s): ESRSEDRATE in the last 72 hours. C-Reactive Protein No results for input(s): CRP in the last 72 hours.  Microbiology: Recent Results (from the past 240 hour(s))  Blood Culture (routine x 2)     Status: None   Collection Time: 02/13/16 10:39 PM  Result Value Ref Range Status   Specimen Description BLOOD BLOOD LEFT FOREARM  Final   Special Requests BOTTLES DRAWN AEROBIC AND ANAEROBIC 5 CC  Final   Culture  Setup Time   Final    GRAM NEGATIVE RODS IN BOTH AEROBIC AND ANAEROBIC BOTTLES CRITICAL RESULT CALLED TO, READ BACK BY AND VERIFIED WITH: D CARPENTER,RN AT 1203  02/14/16 BY L BENFIELD    Culture   Final    ESCHERICHIA COLI Performed at [facility]    Report Status 02/16/2016 FINAL  Final   Organism ID, Bacteria ESCHERICHIA COLI  Final      Susceptibility   Escherichia coli - MIC*    AMPICILLIN 8 SENSITIVE Sensitive     CEFAZOLIN <=4 SENSITIVE Sensitive     CEFEPIME <=1 SENSITIVE Sensitive     CEFTAZIDIME <=1 SENSITIVE Sensitive     CEFTRIAXONE <=1 SENSITIVE Sensitive     CIPROFLOXACIN >=4 RESISTANT Resistant     GENTAMICIN <=1 SENSITIVE Sensitive     IMIPENEM <=0.25 SENSITIVE Sensitive     TRIMETH/SULFA <=20 SENSITIVE Sensitive     AMPICILLIN/SULBACTAM 4 SENSITIVE Sensitive     PIP/TAZO <=4 SENSITIVE Sensitive     * ESCHERICHIA COLI  Blood Culture (routine x 2)     Status: None   Collection Time: 02/13/16 11:26 PM  Result Value Ref Range Status   Specimen Description BLOOD LEFT HAND  Final   Special Requests BOTTLES DRAWN AEROBIC AND ANAEROBIC 5ML  Final   Culture  Setup Time   Final    GRAM NEGATIVE RODS IN BOTH AEROBIC AND ANAEROBIC BOTTLES CRITICAL RESULT CALLED TO, READ BACK BY AND VERIFIED WITH: Tyson Babinski RN 2009 02/14/16 A BROWNING    Culture   Final    ESCHERICHIA COLI SUSCEPTIBILITIES PERFORMED ON PREVIOUS CULTURE WITHIN THE LAST 5 DAYS. Performed at [facility]    Report Status 02/16/2016 FINAL  Final  Urine culture     Status: None   Collection Time: 02/14/16  1:23 AM  Result Value Ref Range Status   Specimen Description URINE, CLEAN CATCH  Final   Special Requests NONE  Final   Culture   Final    NO GROWTH 1 DAY Performed at [facility]    Report Status 02/15/2016 FINAL  Final  MRSA PCR Screening     Status: None   Collection Time: 02/14/16  3:22 AM  Result Value Ref Range Status   MRSA by PCR NEGATIVE NEGATIVE Final    Comment:        The GeneXpert MRSA Assay (FDA approved for NASAL specimens only), is one component of a comprehensive MRSA colonization surveillance program. It is  not intended to diagnose MRSA infection nor to guide or monitor treatment for MRSA infections.     Studies/Results: US Renal  02/16/2016  CLINICAL DATA:  UTI, sepsis, persistent fever EXAM: RENAL / URINARY  TRACT ULTRASOUND COMPLETE COMPARISON:  11/26/2015 FINDINGS: Right Kidney: Length: 11.4 cm.  No mass or hydronephrosis. Left Kidney: Length: 12.3 cm.  Mild pelviectasis. Bladder: Decompressed by indwelling Foley catheter. Additional comments:  Left pleural effusion. IMPRESSION: Mild left pelviectasis, without frank hydronephrosis. Bladder decompressed by indwelling Foley catheter. Left pleural effusion. Electronically Signed   By: Julian Hy M.D.   On: 02/16/2016 17:25     Assessment/Plan: Sepsis UTI due to trauma, foley E coli bacteremia Now afebrile, wbc normal.  Repeat BCx pending ultrasound of kidneys and bladder unremarkable except for pleural effusion  Hematuria Neurogenic bladder Agree he may need suprapubic, defer to urology  Decubitus Ulcer R heel Await debridement. If not done, he needs MRI to stage extent of infection (especially given underlying hardware)  Available if questions over w/e     Total days of antibiotics: 3 vanco/zosyn --> ceftriaxone      Bobby Rumpf Infectious Diseases (pager) [phone] [email or web address] 02/17/2016, 10:53 AM  LOS: 3 days
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
## 2016-02-18 ENCOUNTER — Inpatient Hospital Stay (HOSPITAL_COMMUNITY): Payer: Medicaid Other

## 2016-02-18 DIAGNOSIS — R339 Retention of urine, unspecified: Secondary | ICD-10-CM

## 2016-02-18 LAB — BASIC METABOLIC PANEL
Anion gap: 10 (ref 5–15)
BUN: 11 mg/dL (ref 6–20)
CO2: 24 mmol/L (ref 22–32)
Calcium: 9.1 mg/dL (ref 8.9–10.3)
Chloride: 105 mmol/L (ref 101–111)
Creatinine, Ser: 0.49 mg/dL — ABNORMAL LOW (ref 0.61–1.24)
GFR calc Af Amer: >60 mL/min (ref >60)
GFR calc non Af Amer: >60 mL/min (ref >60)
Glucose, Bld: 94 mg/dL (ref 65–99)
Potassium: 3.7 mmol/L (ref 3.5–5.1)
Sodium: 139 mmol/L (ref 135–145)

## 2016-02-18 MED ORDER — LORAZEPAM 2 MG/ML IJ SOLN
0.5000 mg | Freq: Once | INTRAMUSCULAR | Status: AC
  Administered 2016-02-18: 0.5 mg via INTRAVENOUS
  Filled 2016-02-18: qty 1

## 2016-02-18 MED ORDER — ENOXAPARIN SODIUM 40 MG/0.4ML ~~LOC~~ SOLN
40.0000 mg | SUBCUTANEOUS | Status: DC
  Administered 2016-02-18: 40 mg via SUBCUTANEOUS
  Filled 2016-02-18 (×2): qty 0.4
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
## 2016-02-19 LAB — BASIC METABOLIC PANEL
Anion gap: 11 (ref 5–15)
BUN: 13 mg/dL (ref 6–20)
CHLORIDE: 106 mmol/L (ref 101–111)
CO2: 26 mmol/L (ref 22–32)
CREATININE: 0.65 mg/dL (ref 0.61–1.24)
Calcium: 9.4 mg/dL (ref 8.9–10.3)
GFR calc Af Amer: >60 mL/min (ref >60)
GFR calc non Af Amer: >60 mL/min (ref >60)
Glucose, Bld: 109 mg/dL — ABNORMAL HIGH (ref 65–99)
Potassium: 3.9 mmol/L (ref 3.5–5.1)
Sodium: 143 mmol/L (ref 135–145)

## 2016-02-19 LAB — CBC
HEMATOCRIT: 28.8 % — AB (ref 39.0–52.0)
HEMOGLOBIN: 9.3 g/dL — AB (ref 13.0–17.0)
MCH: 27 pg (ref 26.0–34.0)
MCHC: 32.3 g/dL (ref 30.0–36.0)
MCV: 83.5 fL (ref 78.0–100.0)
Platelets: 427 10*3/uL — ABNORMAL HIGH (ref 150–400)
RBC: 3.45 MIL/uL — ABNORMAL LOW (ref 4.22–5.81)
RDW: 15.1 % (ref 11.5–15.5)
WBC: 8.3 10*3/uL (ref 4.0–10.5)

## 2016-02-19 MED ORDER — SODIUM CHLORIDE 0.9% FLUSH
10.0000 mL | INTRAVENOUS | Status: DC | PRN
Start: 2016-02-19 — End: 2016-02-19

## 2016-02-19 MED ORDER — DEXTROSE 5 % IV SOLN: 2.0000 g | INTRAVENOUS | Status: DC

## 2016-02-19 MED ORDER — SODIUM CHLORIDE 0.9% FLUSH: 10.0000 mL | Freq: Two times a day (BID) | INTRAVENOUS | Status: DC

## 2016-02-19 MED ORDER — SIMETHICONE 40 MG/0.6ML PO SUSP: 40.0000 mg | Freq: Four times a day (QID) | ORAL | Status: DC | PRN

## 2016-02-19 NOTE — Care Management Note (Addendum)
Case Management Note  Patient Details  Name: Phillip [name] MRN: [id number] Date of Birth: 01/24/1966  Subjective/Objective:     Sepsis - E coli bacteremia, Quadriplegia, AKI              Action/Plan: Discharge Planning: AVS reviewed  Scheduled dc to SNF for IV abx. PICC line in place. NCM coordinating with CSW for transition to SNF with IV abx. Rx for IV abx will be sent with SNF package. Unit RN will administer dose prior to dc to SNF.       Expected Discharge Date:  02/19/2016               Expected Discharge Plan:  Skilled Nursing Facility  In-House Referral:  Clinical Social Work  Discharge planning Services  CM Consult  Post Acute Care Choice:  NA Choice offered to:  NA  DME Arranged:  N/A DME Agency:  NA  HH Arranged:  NA HH Agency:  NA  Status of Service:  Completed, signed off  Medicare Important Message Given:    Date Medicare IM Given:    Medicare IM give by:    Date Additional Medicare IM Given:    Additional Medicare Important Message give by:     If discussed at [place] of Stay Meetings, dates discussed:    Additional Comments:  Erenest Rasher, RN 02/19/2016, 2:50 PM

## 2016-02-19 NOTE — Clinical Social Work Note (Signed)
CSW received call that pt was ready for discharge back to [facility].  CSW called [facility] and sent paperwork through the [place].  CSW provided RN with packet and will call for transport at 4pm today as requested by RN  .Dede Query, LCSW [facility] Clinical Social Worker - Weekend Coverage cell #: [phone]

## 2016-02-19 NOTE — Progress Notes (Signed)
Report called to [place].  Barbee [place]. Brigitte Pulse, RN

## 2016-02-19 NOTE — Clinical Social Work Note (Signed)
CSW called [facility] for pt transport back to [facility] SNF  .Dede Query, LCSW [facility] Clinical Social Worker - Weekend Coverage cell #: [phone]
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
## 2016-02-19 NOTE — Progress Notes (Signed)
Patient ID: Phillip [name], male   DOB: 15-Mar-1966, 50 y.o.   MRN: [id number] I was able to assess his right chronic heel wound as well as review the MRI.  There is stable wound margins and good granulation tissue.  I could not express any gross purulence.  The MRI is more consistent with pressure on his lateral calcaneous.  Would continue trying to keep pressure off of his heel and local wound care that is already being done.  Can follow-up with Dr. Alphonzo [name] in 2 weeks.

## 2016-02-19 NOTE — Progress Notes (Signed)
Peripherally Inserted Central Catheter/Midline Placement  The IV Nurse has discussed with the patient and/or persons authorized to consent for the patient, the purpose of this procedure and the potential benefits and risks involved with this procedure.  The benefits include less needle sticks, lab draws from the catheter and patient may be discharged home with the catheter.  Risks include, but not limited to, infection, bleeding, blood clot (thrombus formation), and puncture of an artery; nerve damage and irregular heat beat.  Alternatives to this procedure were also discussed.  PICC/Midline Placement Documentation  PICC Single Lumen 02/19/16 PICC Right Brachial 45 cm 0 cm (Active)  Indication for Insertion or Continuance of Line Home intravenous therapies (PICC only) 02/19/2016  1:59 PM  Exposed Catheter (cm) 0 cm 02/19/2016  1:59 PM  Site Assessment Clean;Dry;Intact 02/19/2016  1:59 PM  Line Status Flushed;Saline locked;Blood return noted 02/19/2016  1:59 PM  Dressing Type Transparent 02/19/2016  1:59 PM  Dressing Status Clean;Dry;Intact 02/19/2016  1:59 PM  Dressing Change Due 02/26/16 02/19/2016  1:59 PM       [facility]ordan Payment 02/19/2016, 2:01 PM
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
## 2016-02-21 LAB — CULTURE, BLOOD (ROUTINE X 2)
CULTURE: NO GROWTH
CULTURE: NO GROWTH

## 2016-02-29 IMAGING — CT CT ABD-PELV W/ CM
2 of 5 series · 4 of 46 positions shown, 5 images · IV contrast (Iodine)
Comparison: 06/05/2008

CLINICAL DATA: Fever abdominal pain and C diff

EXAM:
CT ABDOMEN AND PELVIS WITH CONTRAST
TECHNIQUE: Multidetector CT imaging of the abdomen and pelvis was performed
using the standard protocol following bolus administration of
intravenous contrast.
CONTRAST:  100mL OMNIPAQUE IOHEXOL 300 MG/ML  SOLN

[Series 204: cor · coronal · 0.45mm/px · 3 of 100 slices shown, 4 images]
[im 23/100  soft-tissue]
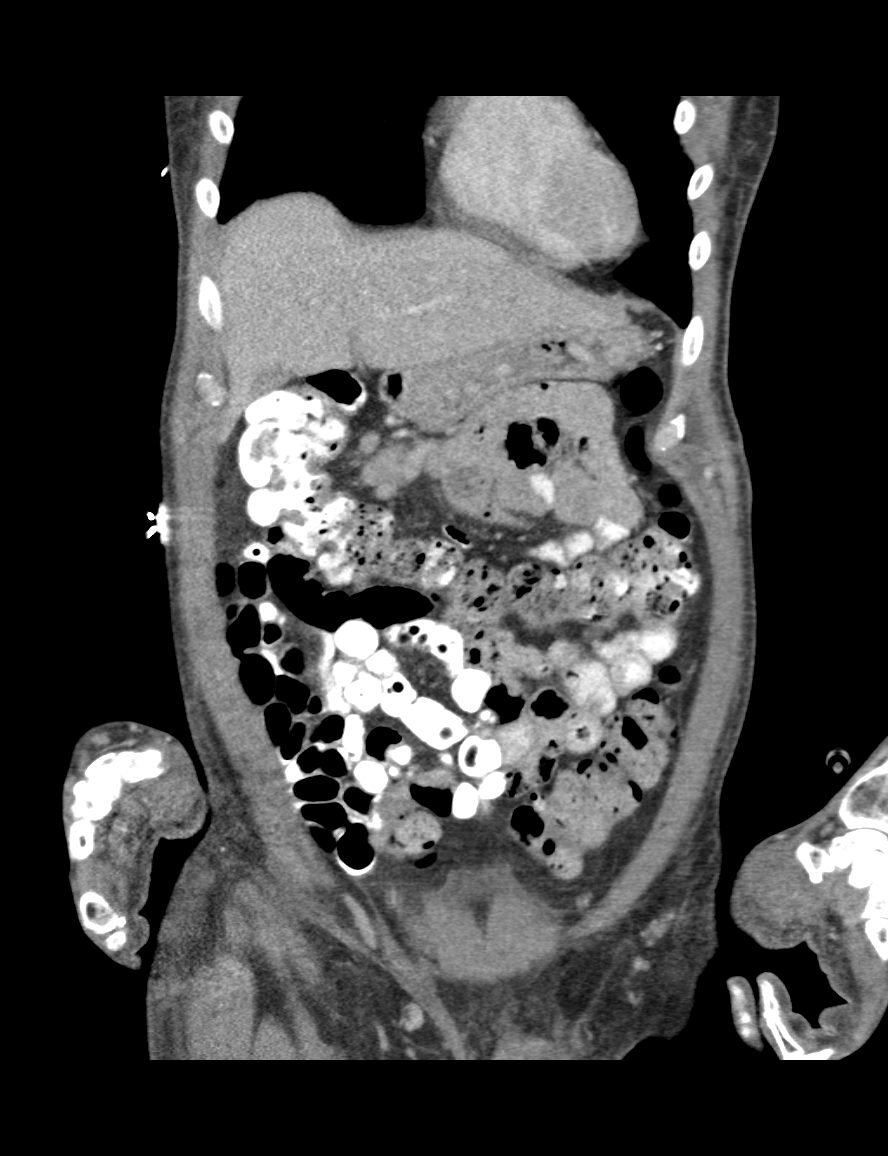
[im 23/100  bone]
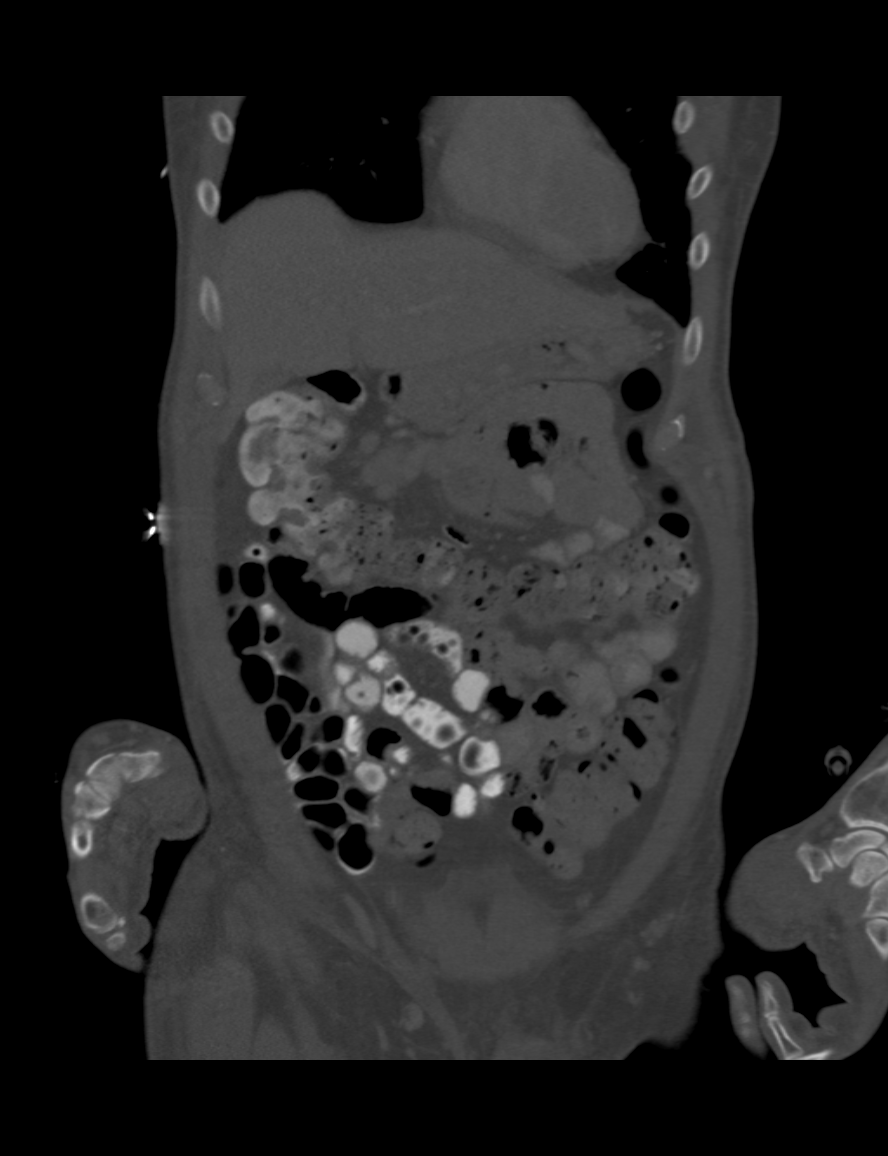
[im 56/100  soft-tissue]
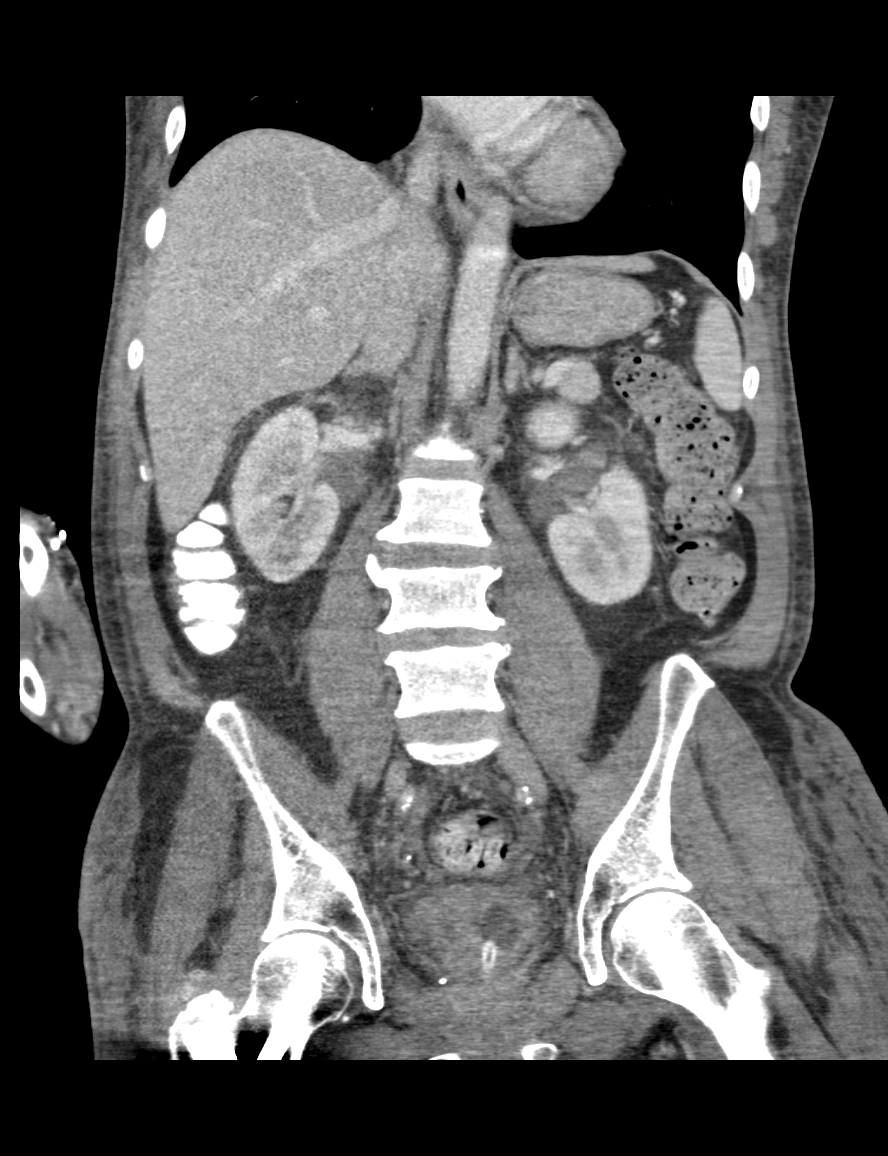
[im 78/100  soft-tissue]
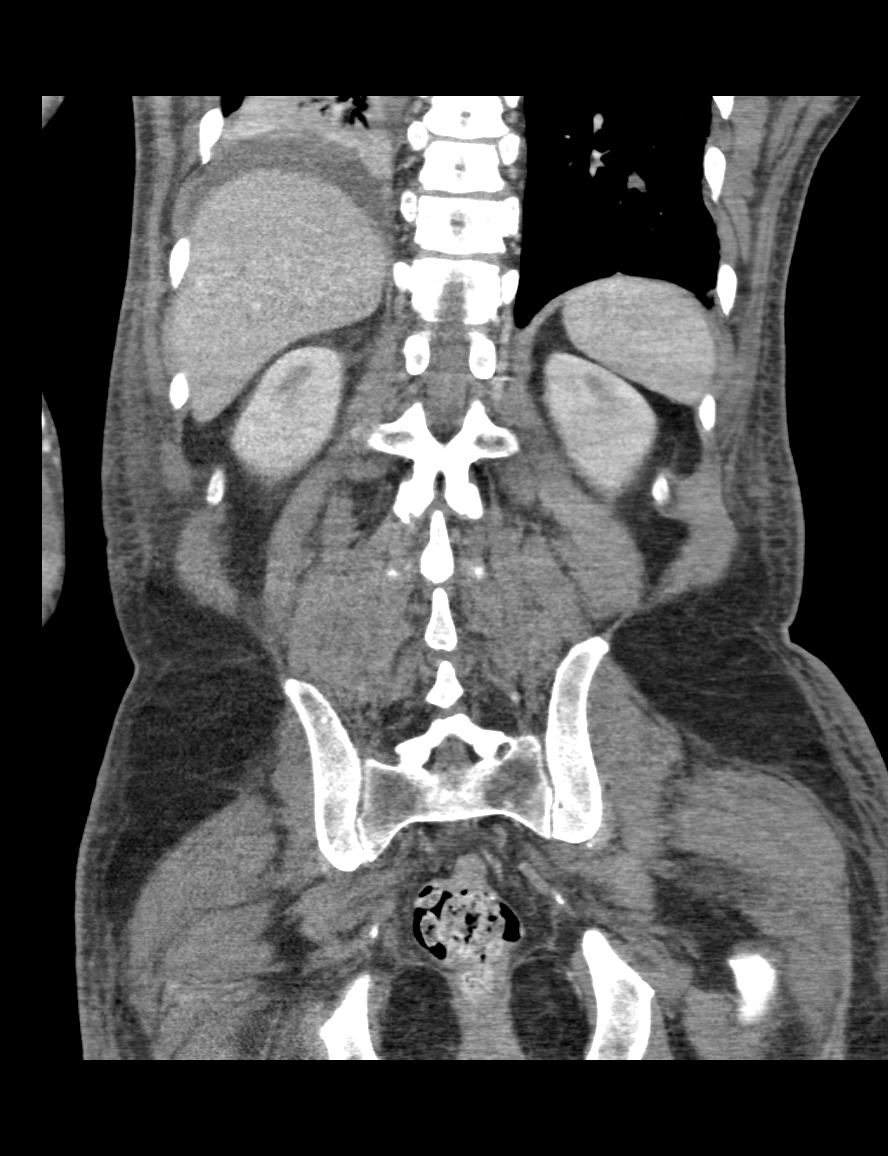

[Series 205: sag · sagittal · 0.45mm/px · 1 of 152 slices shown]
[im 51/152  soft-tissue]
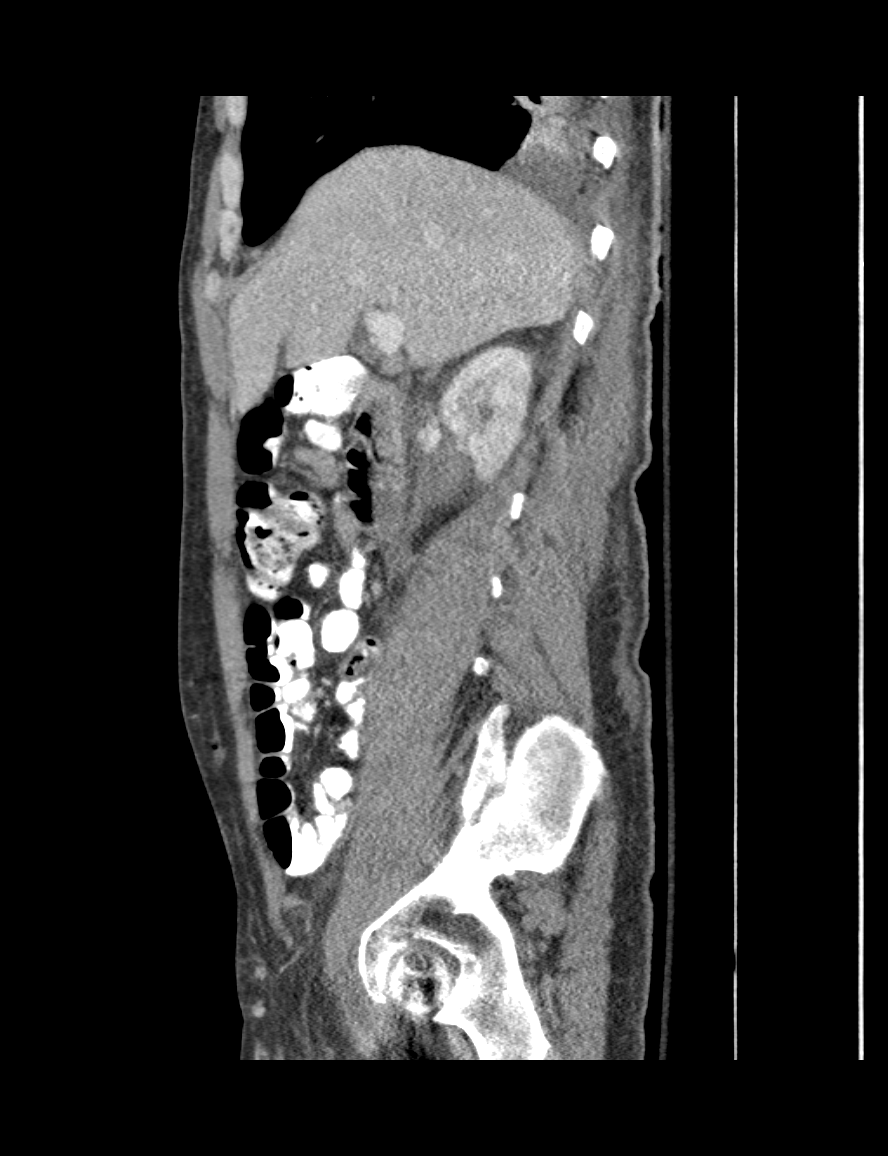

[4 of 46 positions shown; findings below may reference images not displayed]

FINDINGS: There is urinary bladder mural thickening. There is mild
hydronephrosis and hydroureter bilaterally. No renal parenchymal
abnormalities are evident. There are normal appearances of the
liver, spleen, pancreas and adrenals. Stomach and small bowel appear
normal. Appendix is normal. There is extensive colonic
diverticulosis.

There is right lower lobe lung. Consolidation posteriorly. There is
a small right pleural effusion.
IMPRESSION: 1. Urinary bladder mural thickening. This may represent cystitis.
There also is mild hydronephrosis and hydroureter bilaterally
without evidence of obstructing stone or mass.
2. Consolidation in the right lower lobe lung, possibly pneumonia.
Small right pleural effusion.

## 2016-05-31 ENCOUNTER — Other Ambulatory Visit: Payer: Self-pay

## 2016-05-31 ENCOUNTER — Emergency Department (HOSPITAL_COMMUNITY): Payer: Medicaid Other

## 2016-05-31 ENCOUNTER — Encounter (HOSPITAL_COMMUNITY): Payer: Self-pay

## 2016-05-31 ENCOUNTER — Observation Stay (HOSPITAL_COMMUNITY)
Admission: EM | Admit: 2016-05-31 | Discharge: 2016-06-03 | Disposition: A | Discharge status: Skilled Nursing Facility | Payer: Medicaid Other | Attending: Internal Medicine | Admitting: Internal Medicine

## 2016-05-31 DIAGNOSIS — R531 Weakness: Secondary | ICD-10-CM | POA: Diagnosis not present

## 2016-05-31 DIAGNOSIS — G8250 Quadriplegia, unspecified: Secondary | ICD-10-CM | POA: Diagnosis not present

## 2016-05-31 DIAGNOSIS — R55 Syncope and collapse: Secondary | ICD-10-CM

## 2016-05-31 DIAGNOSIS — Z87828 Personal history of other (healed) physical injury and trauma: Secondary | ICD-10-CM | POA: Diagnosis not present

## 2016-05-31 DIAGNOSIS — S14105A Unspecified injury at C5 level of cervical spinal cord, initial encounter: Secondary | ICD-10-CM | POA: Diagnosis present

## 2016-05-31 DIAGNOSIS — L8990 Pressure ulcer of unspecified site, unspecified stage: Secondary | ICD-10-CM | POA: Diagnosis present

## 2016-05-31 DIAGNOSIS — R000 Tachycardia, unspecified: Secondary | ICD-10-CM

## 2016-05-31 DIAGNOSIS — N390 Urinary tract infection, site not specified: Secondary | ICD-10-CM | POA: Insufficient documentation

## 2016-05-31 DIAGNOSIS — G904 Autonomic dysreflexia: Secondary | ICD-10-CM | POA: Diagnosis not present

## 2016-05-31 DIAGNOSIS — S14105S Unspecified injury at C5 level of cervical spinal cord, sequela: Secondary | ICD-10-CM

## 2016-05-31 DIAGNOSIS — I159 Secondary hypertension, unspecified: Secondary | ICD-10-CM

## 2016-05-31 DIAGNOSIS — I10 Essential (primary) hypertension: Secondary | ICD-10-CM | POA: Diagnosis present

## 2016-05-31 DIAGNOSIS — Z981 Arthrodesis status: Secondary | ICD-10-CM | POA: Insufficient documentation

## 2016-05-31 DIAGNOSIS — R319 Hematuria, unspecified: Secondary | ICD-10-CM

## 2016-05-31 DIAGNOSIS — F17210 Nicotine dependence, cigarettes, uncomplicated: Secondary | ICD-10-CM | POA: Diagnosis not present

## 2016-05-31 DIAGNOSIS — E876 Hypokalemia: Secondary | ICD-10-CM | POA: Diagnosis not present

## 2016-05-31 DIAGNOSIS — R945 Abnormal results of liver function studies: Secondary | ICD-10-CM | POA: Diagnosis present

## 2016-05-31 DIAGNOSIS — R739 Hyperglycemia, unspecified: Secondary | ICD-10-CM | POA: Diagnosis not present

## 2016-05-31 DIAGNOSIS — R339 Retention of urine, unspecified: Secondary | ICD-10-CM | POA: Diagnosis present

## 2016-05-31 DIAGNOSIS — R569 Unspecified convulsions: Secondary | ICD-10-CM | POA: Diagnosis not present

## 2016-05-31 DIAGNOSIS — R778 Other specified abnormalities of plasma proteins: Secondary | ICD-10-CM

## 2016-05-31 DIAGNOSIS — R7989 Other specified abnormal findings of blood chemistry: Secondary | ICD-10-CM

## 2016-05-31 HISTORY — DX: Other fracture of right lower leg, initial encounter for closed fracture: S82.891A

## 2016-05-31 HISTORY — DX: Other gram-negative sepsis: A41.59

## 2016-05-31 HISTORY — DX: Fracture of unspecified part of neck of unspecified femur, initial encounter for closed fracture: S72.009A

## 2016-05-31 HISTORY — DX: Other reactions to severe stress: F43.8

## 2016-05-31 LAB — CBC WITH DIFFERENTIAL/PLATELET
Basophils Absolute: 0 10*3/uL (ref 0.0–0.1)
Basophils Relative: 0 %
EOS ABS: 0.3 10*3/uL (ref 0.0–0.7)
Eosinophils Relative: 3 %
HEMATOCRIT: 45.4 % (ref 39.0–52.0)
HEMOGLOBIN: 15.1 g/dL (ref 13.0–17.0)
LYMPHS ABS: 3.3 10*3/uL (ref 0.7–4.0)
LYMPHS PCT: 27 %
MCH: 28.7 pg (ref 26.0–34.0)
MCHC: 33.3 g/dL (ref 30.0–36.0)
MCV: 86.1 fL (ref 78.0–100.0)
MONOS PCT: 5 %
Monocytes Absolute: 0.7 10*3/uL (ref 0.1–1.0)
NEUTROS ABS: 8 10*3/uL — AB (ref 1.7–7.7)
NEUTROS PCT: 65 %
Platelets: 409 10*3/uL — ABNORMAL HIGH (ref 150–400)
RBC: 5.27 MIL/uL (ref 4.22–5.81)
RDW: 14.6 % (ref 11.5–15.5)
WBC: 12.4 10*3/uL — AB (ref 4.0–10.5)

## 2016-05-31 LAB — COMPREHENSIVE METABOLIC PANEL
ALT: 26 U/L (ref 17–63)
AST: 21 U/L (ref 15–41)
Albumin: 4.6 g/dL (ref 3.5–5.0)
Alkaline Phosphatase: 140 U/L — ABNORMAL HIGH (ref 38–126)
Anion gap: 13 (ref 5–15)
BUN: 25 mg/dL — ABNORMAL HIGH (ref 6–20)
CHLORIDE: 103 mmol/L (ref 101–111)
CO2: 22 mmol/L (ref 22–32)
Calcium: 10.2 mg/dL (ref 8.9–10.3)
Creatinine, Ser: 1.16 mg/dL (ref 0.61–1.24)
Glucose, Bld: 136 mg/dL — ABNORMAL HIGH (ref 65–99)
POTASSIUM: 3.7 mmol/L (ref 3.5–5.1)
Sodium: 138 mmol/L (ref 135–145)
Total Bilirubin: 0.2 mg/dL — ABNORMAL LOW (ref 0.3–1.2)
Total Protein: 9.4 g/dL — ABNORMAL HIGH (ref 6.5–8.1)

## 2016-05-31 LAB — URINE MICROSCOPIC-ADD ON

## 2016-05-31 LAB — URINALYSIS, ROUTINE W REFLEX MICROSCOPIC
BILIRUBIN URINE: NEGATIVE
Glucose, UA: NEGATIVE mg/dL
Ketones, ur: NEGATIVE mg/dL
Nitrite: NEGATIVE
SPECIFIC GRAVITY, URINE: 1.015 (ref 1.005–1.030)
pH: 6.5 (ref 5.0–8.0)

## 2016-05-31 LAB — SAMPLE TO BLOOD BANK

## 2016-05-31 LAB — I-STAT CG4 LACTIC ACID, ED: LACTIC ACID, VENOUS: 1.41 mmol/L (ref 0.5–2.0)

## 2016-05-31 LAB — TROPONIN I: TROPONIN I: 0.03 ng/mL (ref <0.031)

## 2016-05-31 MED ORDER — SODIUM CHLORIDE 0.9 % IV BOLUS (SEPSIS)
1000.0000 mL | Freq: Once | INTRAVENOUS | Status: AC
  Administered 2016-05-31: 1000 mL via INTRAVENOUS

## 2016-05-31 NOTE — ED Notes (Signed)
Patient was being seen at [facility] where he was having blood pressure changes.  EMS reports blood pressures from 115 to over [id number] systolic.  Patient is paralyzed from the waist down upper extremities have normal movement. Does have some muscle spasms in the upper extremities.  Patient A&Ox44 no longer feeling dizzy.

## 2016-05-31 NOTE — ED Provider Notes (Signed)
Patient suffered several near syncopal events between yesterday and today. He reports that he is currently asymptomatic. He had a Foley catheter placed 2 days ago at his urologist office. On exam pleasant cooperative no distress lungs clear auscultation heart regular rate and rhythm abdomen nondistended nontender. All 4 extremities with muscular atrophy. He has dime-sized superficial decubitus ulcer on left lower leg and right heel. Otherwise skin is intact  Orlie Dakin, MD 05/31/16 2108
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
## 2016-06-01 ENCOUNTER — Encounter (HOSPITAL_COMMUNITY): Payer: Self-pay | Admitting: Internal Medicine

## 2016-06-01 ENCOUNTER — Observation Stay (HOSPITAL_COMMUNITY): Payer: Medicaid Other

## 2016-06-01 ENCOUNTER — Observation Stay (HOSPITAL_BASED_OUTPATIENT_CLINIC_OR_DEPARTMENT_OTHER): Payer: Medicaid Other

## 2016-06-01 DIAGNOSIS — R739 Hyperglycemia, unspecified: Secondary | ICD-10-CM | POA: Diagnosis not present

## 2016-06-01 DIAGNOSIS — E876 Hypokalemia: Secondary | ICD-10-CM | POA: Diagnosis not present

## 2016-06-01 DIAGNOSIS — G904 Autonomic dysreflexia: Secondary | ICD-10-CM | POA: Diagnosis not present

## 2016-06-01 DIAGNOSIS — K7689 Other specified diseases of liver: Secondary | ICD-10-CM

## 2016-06-01 DIAGNOSIS — R55 Syncope and collapse: Secondary | ICD-10-CM

## 2016-06-01 DIAGNOSIS — G8250 Quadriplegia, unspecified: Secondary | ICD-10-CM

## 2016-06-01 DIAGNOSIS — R569 Unspecified convulsions: Secondary | ICD-10-CM

## 2016-06-01 DIAGNOSIS — I10 Essential (primary) hypertension: Secondary | ICD-10-CM | POA: Diagnosis present

## 2016-06-01 DIAGNOSIS — R339 Retention of urine, unspecified: Secondary | ICD-10-CM

## 2016-06-01 DIAGNOSIS — G459 Transient cerebral ischemic attack, unspecified: Secondary | ICD-10-CM | POA: Diagnosis not present

## 2016-06-01 DIAGNOSIS — L8990 Pressure ulcer of unspecified site, unspecified stage: Secondary | ICD-10-CM

## 2016-06-01 DIAGNOSIS — S14109S Unspecified injury at unspecified level of cervical spinal cord, sequela: Secondary | ICD-10-CM

## 2016-06-01 DIAGNOSIS — R000 Tachycardia, unspecified: Secondary | ICD-10-CM

## 2016-06-01 DIAGNOSIS — R319 Hematuria, unspecified: Secondary | ICD-10-CM | POA: Diagnosis not present

## 2016-06-01 LAB — COMPREHENSIVE METABOLIC PANEL
ALBUMIN: 3.8 g/dL (ref 3.5–5.0)
ALK PHOS: 119 U/L (ref 38–126)
ALT: 23 U/L (ref 17–63)
ANION GAP: 9 (ref 5–15)
AST: 26 U/L (ref 15–41)
BILIRUBIN TOTAL: 0.3 mg/dL (ref 0.3–1.2)
BUN: 23 mg/dL — AB (ref 6–20)
CALCIUM: 9.5 mg/dL (ref 8.9–10.3)
CO2: 24 mmol/L (ref 22–32)
CREATININE: 1.18 mg/dL (ref 0.61–1.24)
Chloride: 105 mmol/L (ref 101–111)
GFR calc Af Amer: >60 mL/min (ref >60)
GFR calc non Af Amer: >60 mL/min (ref >60)
GLUCOSE: 160 mg/dL — AB (ref 65–99)
Potassium: 3.4 mmol/L — ABNORMAL LOW (ref 3.5–5.1)
Sodium: 138 mmol/L (ref 135–145)
TOTAL PROTEIN: 8.2 g/dL — AB (ref 6.5–8.1)

## 2016-06-01 LAB — CBC
HCT: 42.1 % (ref 39.0–52.0)
Hemoglobin: 13.3 g/dL (ref 13.0–17.0)
MCH: 27.1 pg (ref 26.0–34.0)
MCHC: 31.6 g/dL (ref 30.0–36.0)
MCV: 85.9 fL (ref 78.0–100.0)
Platelets: 403 10*3/uL — ABNORMAL HIGH (ref 150–400)
RBC: 4.9 MIL/uL (ref 4.22–5.81)
RDW: 14.5 % (ref 11.5–15.5)
WBC: 11.8 10*3/uL — ABNORMAL HIGH (ref 4.0–10.5)

## 2016-06-01 LAB — D-DIMER, QUANTITATIVE: D-Dimer, Quant: 1.41 ug/mL-FEU — ABNORMAL HIGH (ref 0.00–0.50)

## 2016-06-01 LAB — TROPONIN I
TROPONIN I: 0.04 ng/mL — AB (ref <0.031)
TROPONIN I: 0.03 ng/mL (ref <0.031)
Troponin I: 0.03 ng/mL (ref <0.031)

## 2016-06-01 LAB — MRSA PCR SCREENING: MRSA by PCR: NEGATIVE

## 2016-06-01 MED ORDER — METOPROLOL TARTRATE 12.5 MG HALF TABLET: 12.5000 mg | ORAL_TABLET | Freq: Two times a day (BID) | ORAL | Status: DC

## 2016-06-01 MED ORDER — ACETAMINOPHEN 650 MG RE SUPP: 650.0000 mg | Freq: Four times a day (QID) | RECTAL | Status: DC | PRN

## 2016-06-01 MED ORDER — PRO-STAT SUGAR FREE PO LIQD
30.0000 mL | Freq: Two times a day (BID) | ORAL | Status: DC
  Administered 2016-06-01 – 2016-06-03 (×3): 30 mL via ORAL
  Filled 2016-06-01 (×3): qty 30

## 2016-06-01 MED ORDER — FERROUS SULFATE 325 (65 FE) MG PO TABS
325.0000 mg | ORAL_TABLET | Freq: Two times a day (BID) | ORAL | Status: DC
  Administered 2016-06-01 – 2016-06-03 (×5): 325 mg via ORAL
  Filled 2016-06-01 (×5): qty 1

## 2016-06-01 MED ORDER — BISACODYL 10 MG RE SUPP: 10.0000 mg | Freq: Three times a day (TID) | RECTAL | Status: DC | PRN

## 2016-06-01 MED ORDER — POLYETHYLENE GLYCOL 3350 17 G PO PACK
17.0000 g | PACK | Freq: Every day | ORAL | Status: DC
  Administered 2016-06-01: 17 g via ORAL
  Filled 2016-06-01 (×3): qty 1

## 2016-06-01 MED ORDER — CLONIDINE HCL 0.1 MG PO TABS
0.1000 mg | ORAL_TABLET | Freq: Three times a day (TID) | ORAL | Status: DC
  Administered 2016-06-01 – 2016-06-03 (×7): 0.1 mg via ORAL
  Filled 2016-06-01 (×8): qty 1

## 2016-06-01 MED ORDER — ENOXAPARIN SODIUM 40 MG/0.4ML ~~LOC~~ SOLN
40.0000 mg | SUBCUTANEOUS | Status: DC
  Administered 2016-06-01 – 2016-06-03 (×3): 40 mg via SUBCUTANEOUS
  Filled 2016-06-01 (×3): qty 0.4

## 2016-06-01 MED ORDER — HYDRALAZINE HCL 20 MG/ML IJ SOLN
10.0000 mg | Freq: Four times a day (QID) | INTRAMUSCULAR | Status: DC | PRN
Start: 2016-06-01 — End: 2016-06-03
  Administered 2016-06-01 – 2016-06-02 (×2): 10 mg via INTRAVENOUS
  Filled 2016-06-01 (×2): qty 1

## 2016-06-01 MED ORDER — SODIUM CHLORIDE 0.9 % IV SOLN
INTRAVENOUS | Status: AC
  Administered 2016-06-01: 03:00:00 via INTRAVENOUS

## 2016-06-01 MED ORDER — IOPAMIDOL (ISOVUE-370) INJECTION 76%
INTRAVENOUS | Status: AC
  Administered 2016-06-01: 100 mL
  Filled 2016-06-01: qty 100

## 2016-06-01 MED ORDER — VITAMIN B-12 1000 MCG PO TABS
1000.0000 ug | ORAL_TABLET | Freq: Every day | ORAL | Status: DC
  Administered 2016-06-01 – 2016-06-03 (×3): 1000 ug via ORAL
  Filled 2016-06-01 (×3): qty 1

## 2016-06-01 MED ORDER — ACETAMINOPHEN 325 MG PO TABS
650.0000 mg | ORAL_TABLET | Freq: Four times a day (QID) | ORAL | Status: DC | PRN
  Administered 2016-06-01 – 2016-06-02 (×2): 650 mg via ORAL
  Filled 2016-06-01 (×2): qty 2

## 2016-06-01 MED ORDER — GABAPENTIN 300 MG PO CAPS
300.0000 mg | ORAL_CAPSULE | Freq: Three times a day (TID) | ORAL | Status: DC
  Administered 2016-06-01 – 2016-06-03 (×7): 300 mg via ORAL
  Filled 2016-06-01 (×7): qty 1

## 2016-06-01 MED ORDER — SACCHAROMYCES BOULARDII 250 MG PO CAPS
250.0000 mg | ORAL_CAPSULE | Freq: Two times a day (BID) | ORAL | Status: DC
  Administered 2016-06-01 – 2016-06-03 (×5): 250 mg via ORAL
  Filled 2016-06-01 (×6): qty 1

## 2016-06-01 MED ORDER — PANTOPRAZOLE SODIUM 40 MG PO TBEC
40.0000 mg | DELAYED_RELEASE_TABLET | Freq: Every day | ORAL | Status: DC
  Administered 2016-06-01 – 2016-06-03 (×3): 40 mg via ORAL
  Filled 2016-06-01 (×3): qty 1

## 2016-06-01 MED ORDER — AMITRIPTYLINE HCL 50 MG PO TABS
25.0000 mg | ORAL_TABLET | Freq: Every day | ORAL | Status: DC
  Administered 2016-06-01 – 2016-06-02 (×2): 25 mg via ORAL
  Filled 2016-06-01 (×2): qty 1

## 2016-06-01 MED ORDER — BACLOFEN 10 MG PO TABS
10.0000 mg | ORAL_TABLET | Freq: Three times a day (TID) | ORAL | Status: DC
  Administered 2016-06-01 – 2016-06-03 (×7): 10 mg via ORAL
  Filled 2016-06-01 (×9): qty 1

## 2016-06-01 MED ORDER — METOPROLOL TARTRATE 25 MG PO TABS
25.0000 mg | ORAL_TABLET | Freq: Two times a day (BID) | ORAL | Status: DC
  Administered 2016-06-01 (×2): 25 mg via ORAL
  Filled 2016-06-01 (×3): qty 1

## 2016-06-01 MED ORDER — SODIUM CHLORIDE 0.9 % IV SOLN
INTRAVENOUS | Status: AC
  Administered 2016-06-01 (×2): via INTRAVENOUS

## 2016-06-01 MED ORDER — ENSURE ENLIVE PO LIQD
1.0000 | Freq: Three times a day (TID) | ORAL | Status: DC
  Administered 2016-06-01 – 2016-06-02 (×2): 237 mL via ORAL

## 2016-06-01 MED ORDER — SODIUM CHLORIDE 0.9% FLUSH
3.0000 mL | Freq: Two times a day (BID) | INTRAVENOUS | Status: DC
Start: 2016-06-01 — End: 2016-06-03
  Administered 2016-06-01 – 2016-06-03 (×4): 3 mL via INTRAVENOUS

## 2016-06-01 MED ORDER — VITAMIN C 500 MG PO TABS
500.0000 mg | ORAL_TABLET | Freq: Every day | ORAL | Status: DC
  Administered 2016-06-01 – 2016-06-03 (×3): 500 mg via ORAL
  Filled 2016-06-01 (×3): qty 1

## 2016-06-01 MED ORDER — ADULT MULTIVITAMIN W/MINERALS CH
1.0000 | ORAL_TABLET | Freq: Every day | ORAL | Status: DC
  Administered 2016-06-01 – 2016-06-03 (×3): 1 via ORAL
  Filled 2016-06-01 (×3): qty 1

## 2016-06-01 MED ORDER — POTASSIUM CHLORIDE CRYS ER 20 MEQ PO TBCR
40.0000 meq | EXTENDED_RELEASE_TABLET | Freq: Once | ORAL | Status: AC
  Administered 2016-06-01: 40 meq via ORAL
  Filled 2016-06-01: qty 2

## 2016-06-01 MED ORDER — IOPAMIDOL (ISOVUE-370) INJECTION 76%
INTRAVENOUS | Status: AC
  Filled 2016-06-01: qty 100

## 2016-06-01 MED ORDER — SIMETHICONE 40 MG/0.6ML PO SUSP
40.0000 mg | Freq: Four times a day (QID) | ORAL | Status: DC | PRN
  Filled 2016-06-01: qty 0.6
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
## 2016-06-01 NOTE — Progress Notes (Signed)
Patient left the unit via Bed and monitor at 1240  for MRI, CT of Chest and EEG , cad came back to  [place] 1600  Ate only 10 % of lunch and refused dinner.and ensure supplement. Pt resting quietly in bed with no distress.  For PT consult and evaluation. BP is now  WNL.. BP reads better in the LU A.Marland Kitchen
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
## 2016-06-01 NOTE — ED Notes (Signed)
Patient resting comfortably in bed. Blood pressure continues to fluctuate.  No changes from baseline.  Patient A&Ox4. Will continue to monitor

## 2016-06-01 NOTE — Progress Notes (Signed)
VASCULAR LAB PRELIMINARY  PRELIMINARY  PRELIMINARY  PRELIMINARY  Carotid duplex has been completed.     Bilateral:  1-39% ICA stenosis.  Vertebral artery flow is antegrade.     Janifer Adie, RVT, RDMS 06/01/2016, 12:42 PM

## 2016-06-01 NOTE — Progress Notes (Signed)
EEG Completed; Results Pending  

## 2016-06-01 NOTE — Consult Note (Signed)
WOC wound consult note Reason for Consult: Multiple ulcers  Wound type: Full thickness ulcer to right lateral heel.  Abrasions to right shin. Pressure Ulcer POA: Yes Measurements:   Right lateral heel:  6 cm x 1.5 cm. 0.5.  Wound bed 100% pink, scant drainage.  Rolled wound edge present, no tunneling noted.  Recommendations:  Cleanse wound bed with normal saline; pat dry.  Apply Xeroform gauze to wound bed, cover with dry gauze, secure in place.  Change daily. Right shin abrasions:  Superior wound measures 0.5 cm x 1 cm.  50% think slough.  No odor or drainage.  Inferior wound measures 1.5 cm x 0.8 cm.  100% pink; no odor or drainage.  Recommendation:  Apply foam dressing (Allevyn) to sites.  Change weekly.  Off load pressure to heels by use of pillows or Prevalon boots.  Discussed POC with patient and bedside nurse.  Re consult if needed, will not follow at this time. Thanks Val Riles MSN, RN, CNS-BC, [employer]

## 2016-06-02 ENCOUNTER — Observation Stay (HOSPITAL_BASED_OUTPATIENT_CLINIC_OR_DEPARTMENT_OTHER): Payer: Medicaid Other

## 2016-06-02 DIAGNOSIS — I10 Essential (primary) hypertension: Secondary | ICD-10-CM | POA: Diagnosis not present

## 2016-06-02 DIAGNOSIS — R55 Syncope and collapse: Secondary | ICD-10-CM | POA: Diagnosis not present

## 2016-06-02 DIAGNOSIS — R319 Hematuria, unspecified: Secondary | ICD-10-CM

## 2016-06-02 DIAGNOSIS — R739 Hyperglycemia, unspecified: Secondary | ICD-10-CM | POA: Diagnosis not present

## 2016-06-02 LAB — BASIC METABOLIC PANEL
Anion gap: 8 (ref 5–15)
BUN: 23 mg/dL — ABNORMAL HIGH (ref 6–20)
CALCIUM: 9 mg/dL (ref 8.9–10.3)
CO2: 21 mmol/L — ABNORMAL LOW (ref 22–32)
CREATININE: 0.91 mg/dL (ref 0.61–1.24)
Chloride: 110 mmol/L (ref 101–111)
Glucose, Bld: 86 mg/dL (ref 65–99)
Potassium: 4 mmol/L (ref 3.5–5.1)
SODIUM: 139 mmol/L (ref 135–145)

## 2016-06-02 LAB — ECHOCARDIOGRAM COMPLETE
CHL CUP STROKE VOLUME: 29 mL
EERAT: 14.4
EWDT: 166 ms
FS: 35 % (ref 28–44)
HEIGHTINCHES: 69 in
IVS/LV PW RATIO, ED: 0.92
LA ID, A-P, ES: 32 mm
LA diam index: 1.57 cm/m2
LA vol index: 20.6 mL/m2
LA vol: 42 mL
LAVOLA4C: 37.2 mL
LEFT ATRIUM END SYS DIAM: 32 mm
LV PW d: 12 mm — AB (ref 0.6–1.1)
LV TDI E'LATERAL: 4.68
LV TDI E'MEDIAL: 5.55
LV dias vol index: 29 mL/m2
LV dias vol: 60 mL — AB (ref 62–150)
LV e' LATERAL: 4.68 cm/s
LV sys vol index: 15 mL/m2
LVEEAVG: 14.4
LVEEMED: 14.4
LVOT area: 3.14 cm2
LVOTD: 20 mm
LVSYSVOL: 32 mL (ref 21–61)
MV Dec: 166
MV pk E vel: 67.4 m/s
MVPKAVEL: 86.8 m/s
Reg peak vel: 315 cm/s
Simpson's disk: 48
TAPSE: 27.3 mm
TRMAXVEL: 315 cm/s
WEIGHTICAEL: 3100.55 [oz_av]

## 2016-06-02 LAB — URINE CULTURE

## 2016-06-02 LAB — VAS US CAROTID
LCCAPDIAS: 10 cm/s
LEFT ECA DIAS: -4 cm/s
LEFT VERTEBRAL DIAS: -30 cm/s
LICAPDIAS: -15 cm/s
Left CCA dist dias: -14 cm/s
Left CCA dist sys: -153 cm/s
Left CCA prox sys: 184 cm/s
Left ICA dist dias: -21 cm/s
Left ICA dist sys: -73 cm/s
Left ICA prox sys: -104 cm/s
RCCADSYS: -87 cm/s
RCCAPDIAS: -22 cm/s
RIGHT ECA DIAS: -26 cm/s
RIGHT VERTEBRAL DIAS: -15 cm/s
Right CCA prox sys: -127 cm/s

## 2016-06-02 LAB — HEMOGLOBIN A1C
HEMOGLOBIN A1C: 5.6 % (ref 4.8–5.6)
MEAN PLASMA GLUCOSE: 114 mg/dL

## 2016-06-02 MED ORDER — OXYBUTYNIN CHLORIDE 5 MG PO TABS
5.0000 mg | ORAL_TABLET | Freq: Three times a day (TID) | ORAL | Status: DC
  Administered 2016-06-02 – 2016-06-03 (×3): 5 mg via ORAL
  Filled 2016-06-02 (×3): qty 1

## 2016-06-02 MED ORDER — ZOLPIDEM TARTRATE 5 MG PO TABS
5.0000 mg | ORAL_TABLET | Freq: Once | ORAL | Status: AC
  Administered 2016-06-02: 5 mg via ORAL
  Filled 2016-06-02: qty 1

## 2016-06-02 MED ORDER — BELLADONNA ALKALOIDS-OPIUM 16.2-60 MG RE SUPP: 1.0000 | Freq: Three times a day (TID) | RECTAL | Status: DC | PRN

## 2016-06-02 MED ORDER — METOPROLOL TARTRATE 12.5 MG HALF TABLET
12.5000 mg | ORAL_TABLET | Freq: Two times a day (BID) | ORAL | Status: DC
  Administered 2016-06-02 – 2016-06-03 (×2): 12.5 mg via ORAL
  Filled 2016-06-02 (×2): qty 1

## 2016-06-02 NOTE — Procedures (Signed)
History: 50 yo M beign evaluated for dizziness  Sedation: None  Technique: This is a 21 channel routine scalp EEG performed at the bedside with bipolar and monopolar montages arranged in accordance to the international 10/20 system of electrode placement. One channel was dedicated to EKG recording.    Background: The background consists of intermixed alpha and beta activities. There is a well defined posterior dominant rhythm of  Hz that attenuates with eye opening. Sleep is not recorded.   Photic stimulation: Physiologic driving is not performed  EEG Abnormalities: None  Clinical Interpretation: This normal EEG is recorded in the waking state. There was no seizure or seizure predisposition recorded on this study. Please note that a normal EEG does not preclude the possibility of epilepsy.   Roland Rack, MD [facility] [phone]  If 7pm- 7am, please page neurology on call as listed in [place].

## 2016-06-02 NOTE — Progress Notes (Addendum)
Pt received to ICU. Oriented to room and equipment, call light within reach. CCMD notified. Vitals documented - pt BP low - pt denies dizziness or symptoms. MD made aware.  Per Dr. Rockne Menghini BP changes drastically. When patient is symptomatic cause to be concerned.  Fritz Pickerel, RN

## 2016-06-02 NOTE — Progress Notes (Signed)
Echocardiogram 2D Echocardiogram has been performed.  Phillip [name] 06/02/2016, 10:26 AM

## 2016-06-02 NOTE — Progress Notes (Signed)
Report given to 2W RN. Will transfer shortly.
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
## 2016-06-02 NOTE — Progress Notes (Signed)
Patient foley that was placed by urologist is leaking. Will report to AM nurse.
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
## 2016-06-02 NOTE — Progress Notes (Signed)
Spoke with Dr. Louis Meckel about pt foley leakage. New orders received will continue to monitor.

## 2016-06-02 NOTE — Progress Notes (Signed)
Pt foley flushed with 50mL sterile water per Dr. Rockne Menghini order. Urine collection bag changed at this time. Sterile procedure maintained throughout. Pt reported "splitting headache" after saline was inserted. Some leakage noted around foley at start of flush but let up at end. BP high after procedure. Medication given. Dr. Rockne Menghini made aware.

## 2016-06-03 DIAGNOSIS — R319 Hematuria, unspecified: Secondary | ICD-10-CM | POA: Diagnosis not present

## 2016-06-03 DIAGNOSIS — R55 Syncope and collapse: Secondary | ICD-10-CM | POA: Diagnosis not present

## 2016-06-03 DIAGNOSIS — G904 Autonomic dysreflexia: Secondary | ICD-10-CM | POA: Diagnosis present

## 2016-06-03 DIAGNOSIS — I10 Essential (primary) hypertension: Secondary | ICD-10-CM | POA: Diagnosis not present

## 2016-06-03 LAB — URINE CULTURE

## 2016-06-03 MED ORDER — OXYBUTYNIN CHLORIDE 5 MG PO TABS: 5.0000 mg | ORAL_TABLET | Freq: Three times a day (TID) | ORAL | Status: DC

## 2016-06-03 MED ORDER — ZOLPIDEM TARTRATE 5 MG PO TABS: 5.0000 mg | ORAL_TABLET | Freq: Every evening | ORAL | Status: DC | PRN

## 2016-06-03 MED ORDER — BELLADONNA ALKALOIDS-OPIUM 16.2-60 MG RE SUPP: 1.0000 | Freq: Three times a day (TID) | RECTAL | Status: AC | PRN

## 2016-06-03 MED ORDER — HYDROCORTISONE 1 % EX CREA
1.0000 "application " | TOPICAL_CREAM | CUTANEOUS | Status: DC | PRN
  Administered 2016-06-03: 1 via TOPICAL
  Filled 2016-06-03: qty 28
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
## 2016-06-03 NOTE — Progress Notes (Signed)
Report called to [facility]. All questions answered by RN. RN given phone number to call back if questions. Dressings changed prior to discharge. Telemetry removed, CCMD notified. IV removed.  Fritz Pickerel, RN
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
## 2016-06-03 NOTE — Progress Notes (Signed)
Attempted to call report to facility x1.Will try again in 15 minutes.  Fritz Pickerel, RN

## 2016-06-03 NOTE — Clinical Social Work Note (Signed)
Per MD patient ready to DC back to [facility]. RN, patient/family (Patient states that he will contact family), and facility notified of patient's DC. RN given number for report. DC packet on patient's chart. Ambulance transport requested for patient for 3PM pickup. [facility] states that they are backed up and that it's unlikely they will arrive by 3PM. CSW signing off at this time.  Liz Beach MSW, [place], [place], [id number]
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
## 2016-06-03 NOTE — Progress Notes (Signed)
Pt discharged via PTAR. Pt belongings sent with him. IV and telemetry removed. CCMD notified.  Fritz Pickerel, RN

## 2016-07-13 ENCOUNTER — Other Ambulatory Visit: Payer: Self-pay | Admitting: Urology

## 2016-07-17 ENCOUNTER — Ambulatory Visit (INDEPENDENT_AMBULATORY_CARE_PROVIDER_SITE_OTHER): Payer: Medicaid Other | Admitting: Neurology

## 2016-07-17 ENCOUNTER — Encounter: Payer: Self-pay | Admitting: Neurology

## 2016-07-17 VITALS — BP 98/70 | HR 85

## 2016-07-17 DIAGNOSIS — R404 Transient alteration of awareness: Secondary | ICD-10-CM | POA: Diagnosis not present
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
## 2016-07-17 NOTE — Patient Instructions (Signed)
Remember to drink plenty of fluid, eat healthy meals and do not skip any meals. Try to eat protein with a every meal and eat a healthy snack such as fruit or nuts in between meals. Try to keep a regular sleep-wake schedule and try to exercise daily, particularly in the form of walking, 20-30 minutes a day, if you can.   As far as diagnostic testing: EEG  I would like to see you back as needed, sooner if we need to. Please call us with any interim questions, concerns, problems, updates or refill requests.   Our phone number is [phone]. We also have an after hours call service for urgent matters and there is a physician on-call for urgent questions. For any emergencies you know to call 911 or go to the nearest emergency room

## 2016-07-23 ENCOUNTER — Encounter: Payer: Self-pay | Admitting: *Deleted

## 2016-07-23 ENCOUNTER — Other Ambulatory Visit: Payer: Self-pay | Admitting: Urology

## 2016-07-23 NOTE — Progress Notes (Signed)
Faxed Dr Cathren Laine recent office note to [facility] for their records. Fax: [phone]. Received confirmation.

## 2016-07-31 NOTE — Progress Notes (Signed)
Spoke with victoria, nurse, who will fax t [phone] and [phone] the medical history .  [place] did not have POA papers on file in patient's record but she transferred me to business office who also did not have POA papers on file.  Patient is able to sign own consent forms per nurse, victoria.   Business office to check with Social worker to see if they have any poa papers on file.  Spoke with sister, Clintin Vandyken, sister who is also HCPOA per [place].  She does have POA papers and will bring when she signs consent form for surgery.  [name] will call me back on 8/22 pr 8/23-8/25 as to when she can come prior to surgery to sign consent form for surgery. [name] states it is hard for patient to write his name.

## 2016-07-31 NOTE — Progress Notes (Signed)
Faxed to [facility] the preop instructions for patient.

## 2016-08-08 NOTE — Anesthesia Preprocedure Evaluation (Addendum)
Anesthesia Evaluation  Patient identified by MRN, date of birth, ID band Patient awake    Reviewed: Allergy & Precautions, NPO status , Patient's Chart, lab work & pertinent test results, reviewed documented beta blocker date and time   History of Anesthesia Complications Negative for: history of anesthetic complications  Airway Mallampati: III  TM Distance: >3 FB Neck ROM: Limited   Comment: Previous grade II view with MAC 3 Dental  (+) Dental Advisory Given,    Pulmonary neg shortness of breath, neg sleep apnea, neg COPD, neg recent URI, Current Smoker (smokes 4 cigarrettes a day),    Pulmonary exam normal breath sounds clear to auscultation       Cardiovascular hypertension, Pt. on medications and Pt. on home beta blockers (-) angina(-) Past MI, (-) Cardiac Stents and (-) CABG  Rhythm:Regular Rate:Normal  H/o autonomic dysreflexia  TTE 06/02/2016: Study Conclusions  - Left ventricle: The cavity size was normal. There was moderate   concentric hypertrophy. Systolic function was vigorous. The   estimated ejection fraction was in the range of 65% to 70%. Wall   motion was normal; there were no regional wall motion   abnormalities. Doppler parameters are consistent with abnormal   left ventricular relaxation (grade 1 diastolic dysfunction).   There was no evidence of elevated ventricular filling pressure by   Doppler parameters. - Aortic valve: Trileaflet; normal thickness leaflets. There was no   regurgitation. - Aortic root: The aortic root was normal in size. - Mitral valve: Structurally normal valve. There was no   regurgitation. - Right ventricle: The cavity size was normal. Wall thickness was   normal. Systolic function was normal. - Tricuspid valve: There was mild regurgitation. - Pulmonary arteries: Systolic pressure was mildly increased. PA   peak pressure: 43 mm Hg (S). - Inferior vena cava: The vessel was normal  in size. - Pericardium, extracardiac: There was no pericardial effusion.   Neuro/Psych Seizures -,  Spinal cord injury at C5-C7, quadriplegia    GI/Hepatic negative GI ROS, (+)     substance abuse  alcohol use,   Endo/Other  negative endocrine ROS  Renal/GU negative Renal ROS   Neurogenic bladder    Musculoskeletal  (+) Arthritis ,   Abdominal (+) + obese,   Peds  Hematology  (+) Blood dyscrasia, anemia ,   Anesthesia Other Findings   Reproductive/Obstetrics                            Anesthesia Physical Anesthesia Plan  ASA: III  Anesthesia Plan: General   Post-op Pain Management:    Induction: Intravenous  Airway Management Planned: Oral ETT  Additional Equipment:   Intra-op Plan:   Post-operative Plan: Extubation in OR  Informed Consent: I have reviewed the patients History and Physical, chart, labs and discussed the procedure including the risks, benefits and alternatives for the proposed anesthesia with the patient or authorized representative who has indicated his/her understanding and acceptance.   Dental advisory given  Plan Discussed with:   Anesthesia Plan Comments: (Risks of general anesthesia discussed including, but not limited to, sore throat, hoarse voice, chipped/damaged teeth, injury to vocal cords, nausea and vomiting, allergic reactions, lung infection, heart attack, stroke, and death. All questions answered. )       Anesthesia Quick Evaluation
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
## 2016-08-09 ENCOUNTER — Ambulatory Visit (HOSPITAL_COMMUNITY)
Admission: RE | Admit: 2016-08-09 | Discharge: 2016-08-09 | Disposition: A | Discharge status: Home / Self Care | Payer: Medicaid Other | Source: Ambulatory Visit | Attending: Urology | Admitting: Urology

## 2016-08-09 ENCOUNTER — Encounter (HOSPITAL_COMMUNITY): Payer: Self-pay | Admitting: Anesthesiology

## 2016-08-09 ENCOUNTER — Ambulatory Visit (HOSPITAL_COMMUNITY): Payer: Medicaid Other | Admitting: Anesthesiology

## 2016-08-09 ENCOUNTER — Encounter (HOSPITAL_COMMUNITY)
Admission: RE | Disposition: A | Discharge status: Home / Self Care | Payer: Self-pay | Source: Ambulatory Visit | Attending: Urology

## 2016-08-09 DIAGNOSIS — R3914 Feeling of incomplete bladder emptying: Secondary | ICD-10-CM | POA: Diagnosis not present

## 2016-08-09 DIAGNOSIS — Z87440 Personal history of urinary (tract) infections: Secondary | ICD-10-CM | POA: Diagnosis not present

## 2016-08-09 DIAGNOSIS — F17210 Nicotine dependence, cigarettes, uncomplicated: Secondary | ICD-10-CM | POA: Insufficient documentation

## 2016-08-09 DIAGNOSIS — S14105S Unspecified injury at C5 level of cervical spinal cord, sequela: Secondary | ICD-10-CM | POA: Insufficient documentation

## 2016-08-09 DIAGNOSIS — F329 Major depressive disorder, single episode, unspecified: Secondary | ICD-10-CM | POA: Diagnosis not present

## 2016-08-09 DIAGNOSIS — Z6830 Body mass index (BMI) 30.0-30.9, adult: Secondary | ICD-10-CM | POA: Insufficient documentation

## 2016-08-09 DIAGNOSIS — G8250 Quadriplegia, unspecified: Secondary | ICD-10-CM | POA: Insufficient documentation

## 2016-08-09 DIAGNOSIS — N319 Neuromuscular dysfunction of bladder, unspecified: Secondary | ICD-10-CM | POA: Diagnosis present

## 2016-08-09 DIAGNOSIS — D649 Anemia, unspecified: Secondary | ICD-10-CM | POA: Insufficient documentation

## 2016-08-09 DIAGNOSIS — Z79899 Other long term (current) drug therapy: Secondary | ICD-10-CM | POA: Insufficient documentation

## 2016-08-09 DIAGNOSIS — N368 Other specified disorders of urethra: Secondary | ICD-10-CM | POA: Insufficient documentation

## 2016-08-09 DIAGNOSIS — E669 Obesity, unspecified: Secondary | ICD-10-CM | POA: Diagnosis not present

## 2016-08-09 DIAGNOSIS — I10 Essential (primary) hypertension: Secondary | ICD-10-CM | POA: Insufficient documentation

## 2016-08-09 DIAGNOSIS — F419 Anxiety disorder, unspecified: Secondary | ICD-10-CM | POA: Diagnosis not present

## 2016-08-09 HISTORY — PX: INSERTION OF SUPRAPUBIC CATHETER: SHX5870

## 2016-08-09 HISTORY — PX: CYSTOSCOPY: SHX5120

## 2016-08-09 LAB — SURGICAL PCR SCREEN
MRSA, PCR: NEGATIVE
Staphylococcus aureus: NEGATIVE

## 2016-08-09 LAB — BASIC METABOLIC PANEL
ANION GAP: 8 (ref 5–15)
BUN: 10 mg/dL (ref 6–20)
CHLORIDE: 106 mmol/L (ref 101–111)
CO2: 24 mmol/L (ref 22–32)
Calcium: 9.2 mg/dL (ref 8.9–10.3)
Creatinine, Ser: 0.75 mg/dL (ref 0.61–1.24)
GFR calc non Af Amer: >60 mL/min (ref >60)
Glucose, Bld: 97 mg/dL (ref 65–99)
POTASSIUM: 3.7 mmol/L (ref 3.5–5.1)
SODIUM: 138 mmol/L (ref 135–145)

## 2016-08-09 LAB — CBC
HEMATOCRIT: 34.7 % — AB (ref 39.0–52.0)
HEMOGLOBIN: 11.3 g/dL — AB (ref 13.0–17.0)
MCH: 28.6 pg (ref 26.0–34.0)
MCHC: 32.6 g/dL (ref 30.0–36.0)
MCV: 87.8 fL (ref 78.0–100.0)
PLATELETS: 375 10*3/uL (ref 150–400)
RBC: 3.95 MIL/uL — AB (ref 4.22–5.81)
RDW: 14.5 % (ref 11.5–15.5)
WBC: 9.2 10*3/uL (ref 4.0–10.5)

## 2016-08-09 SURGERY — INSERTION, SUPRAPUBIC CATHETER
Anesthesia: General

## 2016-08-09 MED ORDER — PROPOFOL 10 MG/ML IV BOLUS
INTRAVENOUS | Status: AC
  Filled 2016-08-09: qty 20

## 2016-08-09 MED ORDER — PHENYLEPHRINE 40 MCG/ML (10ML) SYRINGE FOR IV PUSH (FOR BLOOD PRESSURE SUPPORT)
PREFILLED_SYRINGE | INTRAVENOUS | Status: AC
  Filled 2016-08-09: qty 10

## 2016-08-09 MED ORDER — FENTANYL CITRATE (PF) 100 MCG/2ML IJ SOLN
INTRAMUSCULAR | Status: DC | PRN
  Administered 2016-08-09: 50 ug via INTRAVENOUS
  Administered 2016-08-09 (×2): 25 ug via INTRAVENOUS

## 2016-08-09 MED ORDER — PHENYLEPHRINE HCL 10 MG/ML IJ SOLN
INTRAMUSCULAR | Status: DC | PRN
  Administered 2016-08-09: 40 ug via INTRAVENOUS

## 2016-08-09 MED ORDER — FENTANYL CITRATE (PF) 100 MCG/2ML IJ SOLN: 25.0000 ug | INTRAMUSCULAR | Status: DC | PRN

## 2016-08-09 MED ORDER — METOPROLOL SUCCINATE ER 25 MG PO TB24
25.0000 mg | ORAL_TABLET | Freq: Every day | ORAL | Status: DC
  Filled 2016-08-09: qty 1

## 2016-08-09 MED ORDER — DEXAMETHASONE SODIUM PHOSPHATE 10 MG/ML IJ SOLN
INTRAMUSCULAR | Status: AC
  Filled 2016-08-09: qty 1

## 2016-08-09 MED ORDER — CEFAZOLIN SODIUM-DEXTROSE 2-4 GM/100ML-% IV SOLN
2.0000 g | Freq: Once | INTRAVENOUS | Status: AC
  Administered 2016-08-09: 2 g via INTRAVENOUS
  Filled 2016-08-09: qty 100

## 2016-08-09 MED ORDER — LABETALOL HCL 5 MG/ML IV SOLN
INTRAVENOUS | Status: AC
  Filled 2016-08-09: qty 4

## 2016-08-09 MED ORDER — METOPROLOL SUCCINATE 12.5 MG HALF TABLET
12.5000 mg | ORAL_TABLET | Freq: Every day | ORAL | Status: DC
  Administered 2016-08-09: 12.5 mg via ORAL
  Filled 2016-08-09: qty 1

## 2016-08-09 MED ORDER — LIDOCAINE 2% (20 MG/ML) 5 ML SYRINGE
INTRAMUSCULAR | Status: DC | PRN
  Administered 2016-08-09: 100 mg via INTRAVENOUS

## 2016-08-09 MED ORDER — PHENYLEPHRINE HCL 10 MG/ML IJ SOLN
INTRAMUSCULAR | Status: AC
  Filled 2016-08-09: qty 1

## 2016-08-09 MED ORDER — LACTATED RINGERS IV SOLN
INTRAVENOUS | Status: DC | PRN
  Administered 2016-08-09: 12:00:00 via INTRAVENOUS

## 2016-08-09 MED ORDER — DEXAMETHASONE SODIUM PHOSPHATE 10 MG/ML IJ SOLN
INTRAMUSCULAR | Status: DC | PRN
  Administered 2016-08-09: 10 mg via INTRAVENOUS

## 2016-08-09 MED ORDER — PROPOFOL 10 MG/ML IV BOLUS
INTRAVENOUS | Status: DC | PRN
  Administered 2016-08-09 (×2): 200 mg via INTRAVENOUS

## 2016-08-09 MED ORDER — LABETALOL HCL 5 MG/ML IV SOLN
INTRAVENOUS | Status: DC | PRN
  Administered 2016-08-09 (×2): 5 mg via INTRAVENOUS

## 2016-08-09 MED ORDER — CEFAZOLIN SODIUM-DEXTROSE 2-4 GM/100ML-% IV SOLN
INTRAVENOUS | Status: AC
  Filled 2016-08-09: qty 100

## 2016-08-09 MED ORDER — FENTANYL CITRATE (PF) 100 MCG/2ML IJ SOLN
INTRAMUSCULAR | Status: AC
  Filled 2016-08-09: qty 2

## 2016-08-09 MED ORDER — LIDOCAINE 2% (20 MG/ML) 5 ML SYRINGE
INTRAMUSCULAR | Status: AC
  Filled 2016-08-09: qty 5

## 2016-08-09 MED ORDER — PROMETHAZINE HCL 25 MG/ML IJ SOLN: 6.2500 mg | INTRAMUSCULAR | Status: DC | PRN

## 2016-08-09 MED ORDER — STERILE WATER FOR IRRIGATION IR SOLN
Status: DC | PRN
  Administered 2016-08-09: 3000 mL

## 2016-08-09 MED ORDER — NITROGLYCERIN IN D5W 200-5 MCG/ML-% IV SOLN
INTRAVENOUS | Status: AC
  Filled 2016-08-09: qty 250

## 2016-08-09 SURGICAL SUPPLY — 34 items
BAG URINE DRAINAGE (UROLOGICAL SUPPLIES) ×3 IMPLANT
BAG URINE LEG 500ML (DRAIN) ×1 IMPLANT
BAG URO CATCHER STRL LF (MISCELLANEOUS) ×3 IMPLANT
BLADE SURG 15 STRL LF DISP TIS (BLADE) ×1 IMPLANT
BLADE SURG 15 STRL SS (BLADE) ×3
CATH BONANNO SUPRAPUBIC 14G (CATHETERS) ×3 IMPLANT
CATH FOLEY 2WAY SLVR  5CC 20FR (CATHETERS) ×2
CATH FOLEY 2WAY SLVR 5CC 20FR (CATHETERS) IMPLANT
CATH INTERMIT  6FR 70CM (CATHETERS) ×3 IMPLANT
CLOTH BEACON ORANGE TIMEOUT ST (SAFETY) ×3 IMPLANT
COUNTER NEEDLE 20 DBL MAG RED (NEEDLE) ×1 IMPLANT
ELECT PENCIL ROCKER SW 15FT (MISCELLANEOUS) ×2 IMPLANT
ELECT REM PT RETURN 9FT ADLT (ELECTROSURGICAL) ×3
ELECTRODE REM PT RTRN 9FT ADLT (ELECTROSURGICAL) ×1 IMPLANT
GAUZE SPONGE 4X4 12PLY STRL (GAUZE/BANDAGES/DRESSINGS) ×2 IMPLANT
GAUZE SPONGE 4X4 16PLY XRAY LF (GAUZE/BANDAGES/DRESSINGS) ×1 IMPLANT
GLOVE BIOGEL M STRL SZ7.5 (GLOVE) ×3 IMPLANT
GOWN STRL REUS W/TWL LRG LVL3 (GOWN DISPOSABLE) ×6 IMPLANT
GOWN STRL REUS W/TWL XL LVL3 (GOWN DISPOSABLE) ×6 IMPLANT
GUIDEWIRE STR DUAL SENSOR (WIRE) ×3 IMPLANT
MANIFOLD NEPTUNE II (INSTRUMENTS) ×3 IMPLANT
NEEDLE HYPO 22GX1.5 SAFETY (NEEDLE) IMPLANT
NS IRRIG 1000ML POUR BTL (IV SOLUTION) ×1 IMPLANT
PACK CYSTO (CUSTOM PROCEDURE TRAY) ×3 IMPLANT
PLUG CATH AND CAP STER (CATHETERS) IMPLANT
SUT SILK 2 0 SH (SUTURE) ×5 IMPLANT
SYR 20CC LL (SYRINGE) ×3 IMPLANT
SYRINGE 10CC LL (SYRINGE) ×3 IMPLANT
TAPE CLOTH SURG 4X10 WHT LF (GAUZE/BANDAGES/DRESSINGS) ×2 IMPLANT
TOWEL OR 17X26 10 PK STRL BLUE (TOWEL DISPOSABLE) ×3 IMPLANT
TUBING CONNECTING 10 (TUBING) ×2 IMPLANT
TUBING CONNECTING 10' (TUBING) ×1
TUBING INSUFFLATION 10FT LAP (TUBING) IMPLANT
WATER STERILE IRR 3000ML UROMA (IV SOLUTION) ×1 IMPLANT

## 2016-08-09 NOTE — Anesthesia Procedure Notes (Signed)
Procedure Name: Intubation Date/Time: 08/09/2016 12:57 PM Performed by: Danley Danker L Patient Re-evaluated:Patient Re-evaluated prior to inductionOxygen Delivery Method: Circle system utilized Preoxygenation: Pre-oxygenation with 100% oxygen Intubation Type: IV induction Ventilation: Mask ventilation without difficulty and Oral airway inserted - appropriate to patient size Laryngoscope Size: Glidescope and 4 Grade View: Grade II Tube type: Parker flex tip Tube size: 7.0 mm Number of attempts: 2 Airway Equipment and Method: Video-laryngoscopy Placement Confirmation: ETT inserted through vocal cords under direct vision,  positive ETCO2 and breath sounds checked- equal and bilateral Secured at: 21 cm Tube secured with: Tape Dental Injury: Teeth and Oropharynx as per pre-operative assessment  Difficulty Due To: Difficult Airway- due to reduced neck mobility and Difficulty was anticipated

## 2016-08-09 NOTE — Interval H&P Note (Signed)
History and Physical Interval Note:  08/09/2016 12:31 PM  Phillip [name]  has presented today for surgery, with the diagnosis of NEUROGENIC BLADDER  The various methods of treatment have been discussed with the patient and family. After consideration of risks, benefits and other options for treatment, the patient has consented to  Procedure(s): INSERTION OF SUPRAPUBIC CATHETER (N/A) CYSTOSCOPY (N/A) as a surgical intervention .  The patient's history has been reviewed, patient examined, no change in status, stable for surgery.  I have reviewed the patient's chart and labs.  Questions were answered to the patient's satisfaction.     Kellyn Mansfield,LES

## 2016-08-09 NOTE — Anesthesia Postprocedure Evaluation (Signed)
Anesthesia Post Note  Patient: Phillip [name]  Procedure(s) Performed: Procedure(s) (LRB): INSERTION OF SUPRAPUBIC CATHETER (N/A) CYSTOSCOPY (N/A)  Patient location during evaluation: PACU Anesthesia Type: General Level of consciousness: awake and alert Pain management: pain level controlled Vital Signs Assessment: post-procedure vital signs reviewed and stable Respiratory status: spontaneous breathing, nonlabored ventilation and respiratory function stable Cardiovascular status: blood pressure returned to baseline and stable Postop Assessment: no signs of nausea or vomiting Anesthetic complications: no    Last Vitals:  Vitals:   08/09/16 1400 08/09/16 1417  BP: 124/67 134/88  Pulse: 94 93  Resp: 19 16  Temp: 36.7 C 36.5 C    Last Pain:  Vitals:   08/09/16 1400  TempSrc:   PainSc: 0-No pain                 Nilda Simmer

## 2016-08-09 NOTE — Discharge Instructions (Addendum)
1. You may see some blood in the urine and may have some burning with urination for 48-72 hours. You also may notice that you have to urinate more frequently or urgently after your procedure which is normal.  2. You should call should you develop an inability urinate, fever > 101, persistent nausea and vomiting that prevents you from eating or drinking to stay hydrated.       3.   The SP tube dressing may be changed as needed.  Moderate Conscious Sedation, Adult, Care After Refer to this sheet in the next few weeks. These instructions provide you with information on caring for yourself after your procedure. Your health care provider may also give you more specific instructions. Your treatment has been planned according to current medical practices, but problems sometimes occur. Call your health care provider if you have any problems or questions after your procedure. WHAT TO EXPECT AFTER THE PROCEDURE  After your procedure:  You may feel sleepy, clumsy, and have poor balance for several hours.  Vomiting may occur if you eat too soon after the procedure. HOME CARE INSTRUCTIONS  Do not participate in any activities where you could become injured for at least 24 hours. Do not:  Drive.  Swim.  Ride a bicycle.  Operate heavy machinery.  Cook.  Use power tools.  Climb ladders.  Work from a high place.  Do not make important decisions or sign legal documents until you are improved.  If you vomit, drink water, juice, or soup when you can drink without vomiting. Make sure you have little or no nausea before eating solid foods.  Only take over-the-counter or prescription medicines for pain, discomfort, or fever as directed by your health care provider.  Make sure you and your family fully understand everything about the medicines given to you, including what side effects may occur.  You should not drink alcohol, take sleeping pills, or take medicines that cause drowsiness for at least  24 hours.  If you smoke, do not smoke without supervision.  If you are feeling better, you may resume normal activities 24 hours after you were sedated.  Keep all appointments with your health care provider. SEEK MEDICAL CARE IF:  Your skin is pale or bluish in color.  You continue to feel nauseous or vomit.  Your pain is getting worse and is not helped by medicine.  You have bleeding or swelling.  You are still sleepy or feeling clumsy after 24 hours. SEEK IMMEDIATE MEDICAL CARE IF:  You develop a rash.  You have difficulty breathing.  You develop any type of allergic problem.  You have a fever. MAKE SURE YOU:  Understand these instructions.  Will watch your condition.  Will get help right away if you are not doing well or get worse.   This information is not intended to replace advice given to you by your health care provider. Make sure you discuss any questions you have with your health care provider.   Document Released: 09/16/2013 Document Revised: 12/17/2014 Document Reviewed: 09/16/2013 Elsevier Interactive Patient Education [employer].

## 2016-08-09 NOTE — Op Note (Signed)
Preoperative diagnosis:  1. 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 Same as above  Procedure(s): 1. Cystoscopy 2. Placement of suprapubic tube (20 Fr)  Surgeon: Dr. Roxy Horseman, Jr  Anesthesia: General  Complications: None  EBL: Minimal  Indication: Phillip [name] is a 50 year old gentleman with neurogenic bladder secondary to a spinal cord injury.  Urodynamic findings indicated a high DLPP necessitating catheter drainage to minimize upper urinary tract damage.  He has had problems with recurrent UTI and urethral erosion with an indwelling urethral catheter and has elected to proceed with suprapubic tube management. .I discussed the potential benefits and risks of the procedure, side effects of the proposed treatment, the likelihood of the patient achieving the goals of the procedure, and any potential problems that might occur during the procedure or recuperation.  He gives informed consent to proceed.   Description of procedure:  He was taken to the OR and administered general anesthesia.  He was given preoperative antibiotics (and had been on culture specific antibiotics before his procedure), placed in the dorsal lithotomy position and prepped and draped in the usual sterile fashion.  A preoperative timeout was performed.  Cystourethroscopy was performed.  This demonstrated a normal urethra with a high bladder neck.  Inspection of the bladder revealed expected edema from his indwelling catheter without other abnormalities.   I placed the Lowsley retractor through the urethra and made a small incision in the suprapubic region of the abdominal skin.  This was carried down through the subcutaneous tissue and the rectus fascia onto the top of the Lowsley, which was then pushed up through the incision.  The catheter was then grabbed and brought down into the bladder and the Lowsley was removed.  Repeat cystoscopy confirmed proper placement of the 20 Fr  SP tube.  20 cc of sterile water was used to inflate the balloon.    The SP tube was secured to the skin with 2-0 silk sutures and a dressing was applied.  The patient tolerated the procedure well and without complications.  He was able to be awakened and transferred to the recovery unit in satisfactory condition.

## 2016-08-09 NOTE — Transfer of Care (Signed)
Immediate Anesthesia Transfer of Care Note  Patient: Phillip [name]  Procedure(s) Performed: Procedure(s): INSERTION OF SUPRAPUBIC CATHETER (N/A) CYSTOSCOPY (N/A)  Patient Location: PACU  Anesthesia Type:General  Level of Consciousness: awake, alert  and oriented  Airway & Oxygen Therapy: Patient Spontanous Breathing and Patient connected to face mask oxygen  Post-op Assessment: Report given to RN and Post -op Vital signs reviewed and stable  Post vital signs: Reviewed and stable  Last Vitals:  Vitals:   08/09/16 1137  BP: (!) 92/52  Pulse: 88  Resp: 16  Temp: 36.8 C    Last Pain:  Vitals:   08/09/16 1137  TempSrc: Oral         Complications: No apparent anesthesia complications

## 2016-08-10 ENCOUNTER — Encounter (HOSPITAL_COMMUNITY): Payer: Self-pay | Admitting: Urology

## 2016-08-14 ENCOUNTER — Ambulatory Visit: Payer: Medicaid Other

## 2016-08-15 ENCOUNTER — Encounter: Payer: Self-pay | Admitting: Neurology

## 2016-09-12 ENCOUNTER — Other Ambulatory Visit: Payer: Medicaid Other

## 2016-09-13 ENCOUNTER — Other Ambulatory Visit: Payer: Medicaid Other

## 2016-09-28 ENCOUNTER — Ambulatory Visit (INDEPENDENT_AMBULATORY_CARE_PROVIDER_SITE_OTHER): Payer: Medicaid Other | Admitting: Neurology

## 2016-09-28 DIAGNOSIS — R569 Unspecified convulsions: Secondary | ICD-10-CM | POA: Diagnosis not present

## 2016-09-28 NOTE — Procedures (Signed)
    History:  Phillip [name] is a 50 year old patient with a history of seizures and prior spinal cord injury. The patient has had 2 episodes of altered awareness associated with a metabolic or infectious event. The patient has not been placed on seizure medications, he is being reevaluated for the possibility of epilepsy.  This is a routine EEG. No skull defects are noted. Medications include amitriptyline, baclofen, Dulcolax, clonidine, iron supplementation, gabapentin, metoprolol, multivitamins, Zofran, oxybutynin, Protonix, MiraLAX, Zanaflex, B12, vitamin C, zinc supplementation, Ambien, and simethicone.   EEG classification: Normal awake  Description of the recording: The background rhythms of this recording consists of a fairly well modulated medium amplitude alpha rhythm of 8 Hz that is reactive to eye opening and closure. As the record progresses, the patient appears to remain in the waking state throughout the recording. Photic stimulation was performed, resulting in a bilateral and symmetric photic driving response. Hyperventilation was also performed, resulting in a minimal buildup of the background rhythm activities without significant slowing seen. At no time during the recording does there appear to be evidence of spike or spike wave discharges or evidence of focal slowing. EKG monitor shows no evidence of cardiac rhythm abnormalities with a heart rate of 90.  Impression: This is a normal EEG recording in the waking state. No evidence of ictal or interictal discharges are seen.

## 2016-10-01 ENCOUNTER — Telehealth: Payer: Self-pay | Admitting: *Deleted

## 2016-10-01 NOTE — Telephone Encounter (Signed)
Called and spoke to Summer from pt living facility. Advised pt EEG normal.  Called and spoke to [facility] and advised EEG normal. She verbalized understanding.

## 2016-10-01 NOTE — Telephone Encounter (Signed)
-----   Message from Melvenia Beam, MD sent at 10/01/2016 11:03 AM EDT ----- EEG normal thanks

## 2017-03-22 ENCOUNTER — Other Ambulatory Visit: Payer: Self-pay | Admitting: Internal Medicine

## 2017-04-02 ENCOUNTER — Other Ambulatory Visit: Payer: Self-pay | Admitting: Internal Medicine

## 2017-04-02 DIAGNOSIS — M25511 Pain in right shoulder: Secondary | ICD-10-CM

## 2017-04-02 DIAGNOSIS — M25512 Pain in left shoulder: Secondary | ICD-10-CM

## 2017-04-17 ENCOUNTER — Ambulatory Visit
Admission: RE | Admit: 2017-04-17 | Discharge: 2017-04-17 | Disposition: A | Discharge status: Home / Self Care | Payer: Medicaid Other | Source: Ambulatory Visit | Attending: Internal Medicine | Admitting: Internal Medicine

## 2017-04-17 DIAGNOSIS — M25512 Pain in left shoulder: Secondary | ICD-10-CM

## 2017-04-17 DIAGNOSIS — M25511 Pain in right shoulder: Secondary | ICD-10-CM

## 2017-05-29 ENCOUNTER — Encounter (INDEPENDENT_AMBULATORY_CARE_PROVIDER_SITE_OTHER): Payer: Self-pay | Admitting: Orthopedic Surgery

## 2017-05-29 ENCOUNTER — Ambulatory Visit (INDEPENDENT_AMBULATORY_CARE_PROVIDER_SITE_OTHER): Payer: Medicaid Other | Admitting: Orthopedic Surgery

## 2017-05-29 ENCOUNTER — Ambulatory Visit (INDEPENDENT_AMBULATORY_CARE_PROVIDER_SITE_OTHER): Payer: Medicaid Other

## 2017-05-29 DIAGNOSIS — M25511 Pain in right shoulder: Secondary | ICD-10-CM

## 2017-05-29 DIAGNOSIS — M542 Cervicalgia: Secondary | ICD-10-CM

## 2017-05-29 DIAGNOSIS — G8929 Other chronic pain: Secondary | ICD-10-CM

## 2017-05-29 DIAGNOSIS — M25512 Pain in left shoulder: Secondary | ICD-10-CM

## 2017-05-29 MED ORDER — LIDOCAINE HCL 1 % IJ SOLN
5.0000 mL | INTRAMUSCULAR | Status: AC | PRN
  Administered 2017-05-29: 5 mL

## 2017-05-29 MED ORDER — BUPIVACAINE HCL 0.5 % IJ SOLN
9.0000 mL | INTRAMUSCULAR | Status: AC | PRN
  Administered 2017-05-29: 9 mL via INTRA_ARTICULAR

## 2017-05-29 MED ORDER — METHYLPREDNISOLONE ACETATE 40 MG/ML IJ SUSP
40.0000 mg | INTRAMUSCULAR | Status: AC | PRN
  Administered 2017-05-29: 40 mg via INTRA_ARTICULAR
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
## 2017-09-12 ENCOUNTER — Encounter (HOSPITAL_COMMUNITY): Payer: Self-pay

## 2017-09-12 ENCOUNTER — Inpatient Hospital Stay (HOSPITAL_COMMUNITY)
Admission: EM | Admit: 2017-09-12 | Discharge: 2017-09-15 | DRG: 682 | Disposition: A | Discharge status: Skilled Nursing Facility | Payer: Medicaid Other | Attending: Internal Medicine | Admitting: Internal Medicine

## 2017-09-12 ENCOUNTER — Emergency Department (HOSPITAL_COMMUNITY): Payer: Medicaid Other

## 2017-09-12 DIAGNOSIS — G909 Disorder of the autonomic nervous system, unspecified: Secondary | ICD-10-CM | POA: Diagnosis present

## 2017-09-12 DIAGNOSIS — Z23 Encounter for immunization: Secondary | ICD-10-CM

## 2017-09-12 DIAGNOSIS — G8250 Quadriplegia, unspecified: Secondary | ICD-10-CM | POA: Diagnosis present

## 2017-09-12 DIAGNOSIS — T502X5A Adverse effect of carbonic-anhydrase inhibitors, benzothiadiazides and other diuretics, initial encounter: Secondary | ICD-10-CM | POA: Diagnosis present

## 2017-09-12 DIAGNOSIS — R569 Unspecified convulsions: Secondary | ICD-10-CM | POA: Diagnosis present

## 2017-09-12 DIAGNOSIS — F329 Major depressive disorder, single episode, unspecified: Secondary | ICD-10-CM | POA: Diagnosis present

## 2017-09-12 DIAGNOSIS — F17210 Nicotine dependence, cigarettes, uncomplicated: Secondary | ICD-10-CM | POA: Diagnosis present

## 2017-09-12 DIAGNOSIS — K219 Gastro-esophageal reflux disease without esophagitis: Secondary | ICD-10-CM | POA: Diagnosis present

## 2017-09-12 DIAGNOSIS — E876 Hypokalemia: Secondary | ICD-10-CM | POA: Diagnosis present

## 2017-09-12 DIAGNOSIS — Z8249 Family history of ischemic heart disease and other diseases of the circulatory system: Secondary | ICD-10-CM

## 2017-09-12 DIAGNOSIS — N319 Neuromuscular dysfunction of bladder, unspecified: Secondary | ICD-10-CM | POA: Diagnosis present

## 2017-09-12 DIAGNOSIS — S14105S Unspecified injury at C5 level of cervical spinal cord, sequela: Secondary | ICD-10-CM

## 2017-09-12 DIAGNOSIS — E860 Dehydration: Secondary | ICD-10-CM | POA: Diagnosis present

## 2017-09-12 DIAGNOSIS — L89890 Pressure ulcer of other site, unstageable: Secondary | ICD-10-CM | POA: Diagnosis present

## 2017-09-12 DIAGNOSIS — N179 Acute kidney failure, unspecified: Principal | ICD-10-CM | POA: Diagnosis present

## 2017-09-12 DIAGNOSIS — Z720 Tobacco use: Secondary | ICD-10-CM | POA: Diagnosis present

## 2017-09-12 DIAGNOSIS — F32A Depression, unspecified: Secondary | ICD-10-CM | POA: Diagnosis present

## 2017-09-12 DIAGNOSIS — I110 Hypertensive heart disease with heart failure: Secondary | ICD-10-CM | POA: Diagnosis present

## 2017-09-12 DIAGNOSIS — N390 Urinary tract infection, site not specified: Secondary | ICD-10-CM | POA: Diagnosis present

## 2017-09-12 DIAGNOSIS — E785 Hyperlipidemia, unspecified: Secondary | ICD-10-CM | POA: Diagnosis present

## 2017-09-12 DIAGNOSIS — I5032 Chronic diastolic (congestive) heart failure: Secondary | ICD-10-CM | POA: Diagnosis present

## 2017-09-12 DIAGNOSIS — Z981 Arthrodesis status: Secondary | ICD-10-CM

## 2017-09-12 DIAGNOSIS — M1990 Unspecified osteoarthritis, unspecified site: Secondary | ICD-10-CM | POA: Diagnosis present

## 2017-09-12 DIAGNOSIS — I10 Essential (primary) hypertension: Secondary | ICD-10-CM | POA: Diagnosis present

## 2017-09-12 LAB — CBC
HEMATOCRIT: 38.8 % — AB (ref 39.0–52.0)
HEMOGLOBIN: 12.6 g/dL — AB (ref 13.0–17.0)
MCH: 28.4 pg (ref 26.0–34.0)
MCHC: 32.5 g/dL (ref 30.0–36.0)
MCV: 87.6 fL (ref 78.0–100.0)
PLATELETS: 408 10*3/uL — AB (ref 150–400)
RBC: 4.43 MIL/uL (ref 4.22–5.81)
RDW: 13.8 % (ref 11.5–15.5)
WBC: 13.8 10*3/uL — ABNORMAL HIGH (ref 4.0–10.5)

## 2017-09-12 LAB — BASIC METABOLIC PANEL
ANION GAP: 18 — AB (ref 5–15)
BUN: 47 mg/dL — ABNORMAL HIGH (ref 6–20)
CHLORIDE: 91 mmol/L — AB (ref 101–111)
CO2: 25 mmol/L (ref 22–32)
Calcium: 9.4 mg/dL (ref 8.9–10.3)
Creatinine, Ser: 1.77 mg/dL — ABNORMAL HIGH (ref 0.61–1.24)
GFR, EST AFRICAN AMERICAN: 51 mL/min — AB (ref >60)
GFR, EST NON AFRICAN AMERICAN: 44 mL/min — AB (ref >60)
Glucose, Bld: 133 mg/dL — ABNORMAL HIGH (ref 65–99)
POTASSIUM: 2.8 mmol/L — AB (ref 3.5–5.1)
Sodium: 134 mmol/L — ABNORMAL LOW (ref 135–145)

## 2017-09-12 LAB — I-STAT CG4 LACTIC ACID, ED: Lactic Acid, Venous: 1.43 mmol/L (ref 0.5–1.9)

## 2017-09-12 LAB — CBG MONITORING, ED: Glucose-Capillary: 152 mg/dL — ABNORMAL HIGH (ref 65–99)

## 2017-09-12 MED ORDER — POTASSIUM CHLORIDE CRYS ER 20 MEQ PO TBCR
40.0000 meq | EXTENDED_RELEASE_TABLET | Freq: Once | ORAL | Status: AC
  Administered 2017-09-13: 40 meq via ORAL
  Filled 2017-09-12: qty 2

## 2017-09-12 MED ORDER — SODIUM CHLORIDE 0.9 % IV BOLUS (SEPSIS)
1000.0000 mL | Freq: Once | INTRAVENOUS | Status: AC
  Administered 2017-09-12: 1000 mL via INTRAVENOUS

## 2017-09-12 MED ORDER — POTASSIUM CHLORIDE 10 MEQ/100ML IV SOLN
10.0000 meq | Freq: Once | INTRAVENOUS | Status: AC
  Administered 2017-09-13: 10 meq via INTRAVENOUS
  Filled 2017-09-12: qty 100

## 2017-09-12 NOTE — ED Triage Notes (Signed)
Patient here from [facility] for progressing lethargy. Patient is a quadriplegic and can talk.  Patient answers yes and no questions.  Responded yes to being too tired to talk. CBG 140 for EMS.
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
## 2017-09-13 DIAGNOSIS — M1990 Unspecified osteoarthritis, unspecified site: Secondary | ICD-10-CM | POA: Diagnosis present

## 2017-09-13 DIAGNOSIS — I10 Essential (primary) hypertension: Secondary | ICD-10-CM | POA: Diagnosis not present

## 2017-09-13 DIAGNOSIS — E876 Hypokalemia: Secondary | ICD-10-CM | POA: Diagnosis present

## 2017-09-13 DIAGNOSIS — N390 Urinary tract infection, site not specified: Secondary | ICD-10-CM | POA: Diagnosis present

## 2017-09-13 DIAGNOSIS — Z981 Arthrodesis status: Secondary | ICD-10-CM | POA: Diagnosis not present

## 2017-09-13 DIAGNOSIS — N3000 Acute cystitis without hematuria: Secondary | ICD-10-CM

## 2017-09-13 DIAGNOSIS — F32A Depression, unspecified: Secondary | ICD-10-CM | POA: Diagnosis present

## 2017-09-13 DIAGNOSIS — S14105S Unspecified injury at C5 level of cervical spinal cord, sequela: Secondary | ICD-10-CM | POA: Diagnosis not present

## 2017-09-13 DIAGNOSIS — G8250 Quadriplegia, unspecified: Secondary | ICD-10-CM

## 2017-09-13 DIAGNOSIS — E785 Hyperlipidemia, unspecified: Secondary | ICD-10-CM

## 2017-09-13 DIAGNOSIS — L89890 Pressure ulcer of other site, unstageable: Secondary | ICD-10-CM | POA: Diagnosis present

## 2017-09-13 DIAGNOSIS — E860 Dehydration: Secondary | ICD-10-CM | POA: Diagnosis present

## 2017-09-13 DIAGNOSIS — Z23 Encounter for immunization: Secondary | ICD-10-CM | POA: Diagnosis not present

## 2017-09-13 DIAGNOSIS — N319 Neuromuscular dysfunction of bladder, unspecified: Secondary | ICD-10-CM | POA: Diagnosis present

## 2017-09-13 DIAGNOSIS — T502X5A Adverse effect of carbonic-anhydrase inhibitors, benzothiadiazides and other diuretics, initial encounter: Secondary | ICD-10-CM | POA: Diagnosis present

## 2017-09-13 DIAGNOSIS — Z720 Tobacco use: Secondary | ICD-10-CM

## 2017-09-13 DIAGNOSIS — F329 Major depressive disorder, single episode, unspecified: Secondary | ICD-10-CM | POA: Diagnosis present

## 2017-09-13 DIAGNOSIS — G9341 Metabolic encephalopathy: Secondary | ICD-10-CM | POA: Insufficient documentation

## 2017-09-13 DIAGNOSIS — G909 Disorder of the autonomic nervous system, unspecified: Secondary | ICD-10-CM | POA: Diagnosis present

## 2017-09-13 DIAGNOSIS — F17210 Nicotine dependence, cigarettes, uncomplicated: Secondary | ICD-10-CM | POA: Diagnosis present

## 2017-09-13 DIAGNOSIS — K219 Gastro-esophageal reflux disease without esophagitis: Secondary | ICD-10-CM

## 2017-09-13 DIAGNOSIS — N179 Acute kidney failure, unspecified: Secondary | ICD-10-CM | POA: Diagnosis not present

## 2017-09-13 DIAGNOSIS — I5032 Chronic diastolic (congestive) heart failure: Secondary | ICD-10-CM | POA: Diagnosis present

## 2017-09-13 DIAGNOSIS — I110 Hypertensive heart disease with heart failure: Secondary | ICD-10-CM | POA: Diagnosis present

## 2017-09-13 DIAGNOSIS — Z8249 Family history of ischemic heart disease and other diseases of the circulatory system: Secondary | ICD-10-CM | POA: Diagnosis not present

## 2017-09-13 DIAGNOSIS — K210 Gastro-esophageal reflux disease with esophagitis: Secondary | ICD-10-CM | POA: Diagnosis not present

## 2017-09-13 DIAGNOSIS — R569 Unspecified convulsions: Secondary | ICD-10-CM | POA: Diagnosis present

## 2017-09-13 LAB — HIV ANTIBODY (ROUTINE TESTING W REFLEX): HIV Screen 4th Generation wRfx: NONREACTIVE

## 2017-09-13 LAB — CBC
HCT: 40.4 % (ref 39.0–52.0)
HEMOGLOBIN: 13.7 g/dL (ref 13.0–17.0)
MCH: 29.8 pg (ref 26.0–34.0)
MCHC: 33.9 g/dL (ref 30.0–36.0)
MCV: 87.8 fL (ref 78.0–100.0)
Platelets: 417 10*3/uL — ABNORMAL HIGH (ref 150–400)
RBC: 4.6 MIL/uL (ref 4.22–5.81)
RDW: 13.5 % (ref 11.5–15.5)
WBC: 18 10*3/uL — ABNORMAL HIGH (ref 4.0–10.5)

## 2017-09-13 LAB — BASIC METABOLIC PANEL
ANION GAP: 19 — AB (ref 5–15)
BUN: 45 mg/dL — ABNORMAL HIGH (ref 6–20)
CALCIUM: 9.3 mg/dL (ref 8.9–10.3)
CHLORIDE: 92 mmol/L — AB (ref 101–111)
CO2: 23 mmol/L (ref 22–32)
Creatinine, Ser: 1.6 mg/dL — ABNORMAL HIGH (ref 0.61–1.24)
GFR calc non Af Amer: 49 mL/min — ABNORMAL LOW (ref >60)
GFR, EST AFRICAN AMERICAN: 57 mL/min — AB (ref >60)
Glucose, Bld: 121 mg/dL — ABNORMAL HIGH (ref 65–99)
Potassium: 3.2 mmol/L — ABNORMAL LOW (ref 3.5–5.1)
Sodium: 134 mmol/L — ABNORMAL LOW (ref 135–145)

## 2017-09-13 LAB — URINALYSIS, ROUTINE W REFLEX MICROSCOPIC
Bilirubin Urine: NEGATIVE
GLUCOSE, UA: NEGATIVE mg/dL
KETONES UR: 5 mg/dL — AB
NITRITE: NEGATIVE
PH: 6 (ref 5.0–8.0)
Protein, ur: 100 mg/dL — AB
SPECIFIC GRAVITY, URINE: 1.013 (ref 1.005–1.030)

## 2017-09-13 LAB — MAGNESIUM: MAGNESIUM: 2.1 mg/dL (ref 1.7–2.4)

## 2017-09-13 LAB — MRSA PCR SCREENING: MRSA BY PCR: POSITIVE — AB

## 2017-09-13 LAB — BRAIN NATRIURETIC PEPTIDE: B Natriuretic Peptide: 15.6 pg/mL (ref 0.0–100.0)

## 2017-09-13 LAB — CREATININE, URINE, RANDOM: CREATININE, URINE: 110.91 mg/dL

## 2017-09-13 LAB — I-STAT CG4 LACTIC ACID, ED: Lactic Acid, Venous: 0.87 mmol/L (ref 0.5–1.9)

## 2017-09-13 MED ORDER — BACLOFEN 20 MG PO TABS: 20.0000 mg | ORAL_TABLET | Freq: Three times a day (TID) | ORAL | Status: DC

## 2017-09-13 MED ORDER — POTASSIUM CHLORIDE CRYS ER 20 MEQ PO TBCR
40.0000 meq | EXTENDED_RELEASE_TABLET | Freq: Once | ORAL | Status: AC
  Administered 2017-09-13: 40 meq via ORAL
  Filled 2017-09-13: qty 2

## 2017-09-13 MED ORDER — BELLADONNA ALKALOIDS-OPIUM 16.2-60 MG RE SUPP: 1.0000 | Freq: Three times a day (TID) | RECTAL | Status: DC | PRN

## 2017-09-13 MED ORDER — AMITRIPTYLINE HCL 50 MG PO TABS
25.0000 mg | ORAL_TABLET | Freq: Every day | ORAL | Status: DC
  Administered 2017-09-13 – 2017-09-14 (×2): 25 mg via ORAL
  Filled 2017-09-13 (×2): qty 1

## 2017-09-13 MED ORDER — NICOTINE 21 MG/24HR TD PT24
21.0000 mg | MEDICATED_PATCH | Freq: Every day | TRANSDERMAL | Status: DC
  Administered 2017-09-14: 21 mg via TRANSDERMAL
  Filled 2017-09-13 (×2): qty 1

## 2017-09-13 MED ORDER — GABAPENTIN 300 MG PO CAPS
300.0000 mg | ORAL_CAPSULE | Freq: Three times a day (TID) | ORAL | Status: DC
  Administered 2017-09-13 – 2017-09-15 (×7): 300 mg via ORAL
  Filled 2017-09-13 (×8): qty 1

## 2017-09-13 MED ORDER — HYDRALAZINE HCL 20 MG/ML IJ SOLN: 5.0000 mg | INTRAMUSCULAR | Status: DC | PRN

## 2017-09-13 MED ORDER — PNEUMOCOCCAL VAC POLYVALENT 25 MCG/0.5ML IJ INJ
0.5000 mL | INJECTION | INTRAMUSCULAR | Status: AC
  Administered 2017-09-14: 0.5 mL via INTRAMUSCULAR
  Filled 2017-09-13: qty 0.5

## 2017-09-13 MED ORDER — ONDANSETRON HCL 4 MG/2ML IJ SOLN: 4.0000 mg | Freq: Three times a day (TID) | INTRAMUSCULAR | Status: DC | PRN

## 2017-09-13 MED ORDER — ENOXAPARIN SODIUM 40 MG/0.4ML ~~LOC~~ SOLN
40.0000 mg | SUBCUTANEOUS | Status: DC
  Administered 2017-09-13 – 2017-09-15 (×3): 40 mg via SUBCUTANEOUS
  Filled 2017-09-13 (×4): qty 0.4

## 2017-09-13 MED ORDER — VITAMIN C 500 MG PO TABS
500.0000 mg | ORAL_TABLET | Freq: Two times a day (BID) | ORAL | Status: DC
  Administered 2017-09-13 – 2017-09-15 (×5): 500 mg via ORAL
  Filled 2017-09-13 (×6): qty 1

## 2017-09-13 MED ORDER — MENTHOL (TOPICAL ANALGESIC) 4 % EX GEL: Freq: Three times a day (TID) | CUTANEOUS | Status: DC

## 2017-09-13 MED ORDER — DARIFENACIN HYDROBROMIDE ER 7.5 MG PO TB24
7.5000 mg | ORAL_TABLET | Freq: Every day | ORAL | Status: DC
  Administered 2017-09-13 – 2017-09-15 (×3): 7.5 mg via ORAL
  Filled 2017-09-13 (×3): qty 1

## 2017-09-13 MED ORDER — CHLORHEXIDINE GLUCONATE CLOTH 2 % EX PADS
6.0000 | MEDICATED_PAD | Freq: Every day | CUTANEOUS | Status: DC
  Administered 2017-09-13 – 2017-09-14 (×2): 6 via TOPICAL

## 2017-09-13 MED ORDER — ZINC SULFATE 220 (50 ZN) MG PO CAPS
220.0000 mg | ORAL_CAPSULE | Freq: Every day | ORAL | Status: DC
  Administered 2017-09-13 – 2017-09-15 (×3): 220 mg via ORAL
  Filled 2017-09-13 (×3): qty 1

## 2017-09-13 MED ORDER — PANTOPRAZOLE SODIUM 40 MG PO TBEC
40.0000 mg | DELAYED_RELEASE_TABLET | Freq: Every day | ORAL | Status: DC
  Administered 2017-09-13 – 2017-09-15 (×3): 40 mg via ORAL
  Filled 2017-09-13 (×3): qty 1

## 2017-09-13 MED ORDER — MAGNESIUM SULFATE IN D5W 1-5 GM/100ML-% IV SOLN
1.0000 g | Freq: Once | INTRAVENOUS | Status: AC
  Administered 2017-09-13: 1 g via INTRAVENOUS
  Filled 2017-09-13: qty 100

## 2017-09-13 MED ORDER — DEXTROSE 5 % IV SOLN
1.0000 g | INTRAVENOUS | Status: DC
  Administered 2017-09-14 – 2017-09-15 (×2): 1 g via INTRAVENOUS
  Filled 2017-09-13 (×2): qty 10

## 2017-09-13 MED ORDER — METOPROLOL TARTRATE 12.5 MG HALF TABLET
12.5000 mg | ORAL_TABLET | Freq: Two times a day (BID) | ORAL | Status: DC
  Administered 2017-09-13 – 2017-09-15 (×5): 12.5 mg via ORAL
  Filled 2017-09-13 (×5): qty 1

## 2017-09-13 MED ORDER — MUPIROCIN 2 % EX OINT
1.0000 "application " | TOPICAL_OINTMENT | Freq: Two times a day (BID) | CUTANEOUS | Status: DC
  Administered 2017-09-13 – 2017-09-15 (×5): 1 via NASAL
  Filled 2017-09-13 (×3): qty 22

## 2017-09-13 MED ORDER — ONDANSETRON HCL 4 MG/2ML IJ SOLN
4.0000 mg | Freq: Once | INTRAMUSCULAR | Status: AC
  Administered 2017-09-13: 4 mg via INTRAVENOUS
  Filled 2017-09-13: qty 2

## 2017-09-13 MED ORDER — FERROUS SULFATE 325 (65 FE) MG PO TABS
325.0000 mg | ORAL_TABLET | Freq: Two times a day (BID) | ORAL | Status: DC
  Administered 2017-09-13 – 2017-09-15 (×6): 325 mg via ORAL
  Filled 2017-09-13 (×7): qty 1

## 2017-09-13 MED ORDER — DERMACLOUD EX CREA: TOPICAL_CREAM | Freq: Three times a day (TID) | CUTANEOUS | Status: DC

## 2017-09-13 MED ORDER — MELATONIN 3 MG PO TABS
6.0000 mg | ORAL_TABLET | Freq: Every evening | ORAL | Status: DC | PRN
  Administered 2017-09-13 – 2017-09-14 (×2): 6 mg via ORAL
  Filled 2017-09-13 (×3): qty 2

## 2017-09-13 MED ORDER — VITAMIN B-12 1000 MCG PO TABS
1000.0000 ug | ORAL_TABLET | Freq: Every day | ORAL | Status: DC
  Administered 2017-09-13 – 2017-09-15 (×3): 1000 ug via ORAL
  Filled 2017-09-13 (×4): qty 1

## 2017-09-13 MED ORDER — BACLOFEN 10 MG PO TABS
30.0000 mg | ORAL_TABLET | Freq: Three times a day (TID) | ORAL | Status: DC
  Administered 2017-09-13 – 2017-09-15 (×8): 30 mg via ORAL
  Filled 2017-09-13 (×9): qty 3

## 2017-09-13 MED ORDER — SODIUM CHLORIDE 0.9 % IV SOLN
INTRAVENOUS | Status: DC
  Administered 2017-09-13 (×2): via INTRAVENOUS

## 2017-09-13 MED ORDER — SACCHAROMYCES BOULARDII 250 MG PO CAPS
250.0000 mg | ORAL_CAPSULE | Freq: Two times a day (BID) | ORAL | Status: DC
  Administered 2017-09-13 – 2017-09-15 (×5): 250 mg via ORAL
  Filled 2017-09-13 (×5): qty 1

## 2017-09-13 MED ORDER — CLONIDINE HCL 0.1 MG PO TABS
0.1000 mg | ORAL_TABLET | Freq: Three times a day (TID) | ORAL | Status: DC
  Administered 2017-09-13 – 2017-09-15 (×6): 0.1 mg via ORAL
  Filled 2017-09-13 (×8): qty 1

## 2017-09-13 MED ORDER — COLLAGENASE 250 UNIT/GM EX OINT
TOPICAL_OINTMENT | Freq: Every day | CUTANEOUS | Status: DC
  Administered 2017-09-13 – 2017-09-15 (×3): via TOPICAL
  Filled 2017-09-13: qty 30

## 2017-09-13 MED ORDER — ACETAMINOPHEN 325 MG PO TABS
650.0000 mg | ORAL_TABLET | Freq: Three times a day (TID) | ORAL | Status: DC
  Administered 2017-09-13 – 2017-09-15 (×8): 650 mg via ORAL
  Filled 2017-09-13 (×10): qty 2

## 2017-09-13 MED ORDER — ADULT MULTIVITAMIN W/MINERALS CH
1.0000 | ORAL_TABLET | Freq: Every day | ORAL | Status: DC
Start: 2017-09-13 — End: 2017-09-15
  Administered 2017-09-13 – 2017-09-15 (×3): 1 via ORAL
  Filled 2017-09-13 (×3): qty 1

## 2017-09-13 MED ORDER — ATORVASTATIN CALCIUM 40 MG PO TABS
40.0000 mg | ORAL_TABLET | Freq: Every day | ORAL | Status: DC
  Administered 2017-09-13 – 2017-09-15 (×3): 40 mg via ORAL
  Filled 2017-09-13 (×3): qty 1

## 2017-09-13 MED ORDER — BISACODYL 10 MG RE SUPP: 10.0000 mg | Freq: Three times a day (TID) | RECTAL | Status: DC | PRN

## 2017-09-13 NOTE — Consult Note (Addendum)
[facility] Nurse wound consult note Reason for Consult: Consult requested for sacrum/buttocks, left knee and left foot. Wound type: Sacrum and buttocks with pink dry scar tissue from previous wounds which have healed.  There are patchy areas of pink moist partial thickness skin loss, appearance consistent with moisture associated skin damage.  Pt is frequently incontinent of liquid stool and it is difficult to keep area from being constantly moist.  Scar tissue is high risk to evolve into full thickness skin loss. Left outer foot with unstageable pressure injury; 1.2X.1.2X.2cm, yellow slough in wound bed, no odor, small amt yellow drainage. Left outer knee with unstageable pressure injury; .8X.8, yellow slough in wound bed, no odor, small amt yellow drainage. Pressure Injury POA: Yes Dressing procedure/placement/frequency: Air mattress to reduce pressure.  Foam dressings to protect buttocks from moisture and shear. Heel lift boots in place to float heels. Santyl ointment to provide enzymatic debridement of nonviable tissue to left foot and knee. Discussed plan of care with patient and he verbalized understanding. Please re-consult if further assistance is needed.  Thank-you,  Julien Girt MSN, [place], [place], [place], [place]

## 2017-09-13 NOTE — Progress Notes (Signed)
PROGRESS NOTE        PATIENT DETAILS Name: Phillip [name] Age: 51 y.o. Sex: male Date of Birth: [birth year] Admit Date: 09/12/2017 Admitting Physician Ivor Costa, MD ZES:[id number], No Pcp Per  Brief Narrative: Patient is a 51 y.o. 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 Admitted for further evaluation and treatment, see below for further details.  Subjective: Sleeping and woke up when I walked in. No major complaints overnight-claims he is slightly better.  Assessment/Plan: Acute kidney injury: Likely hemodynamically mediated-continue supportive care and gentle hydration-avoid nephrotoxic agents, and follow electrolytes.  Complicated UTI: Given leukocytosis-would continue to treat with antimicrobial therapy-although with a chronic indwelling Foley catheter-could have conization. He is afebrile and does not appear to be acutely sick on exam. Follow cultures.  Hypokalemia: Probably secondary to diuretic therapy-replete and recheck.  Hypertension: Relatively well controlled-continue clonidine, metoprolol-follow and adjust accordingly.  Chronic diastolic heart failure: Volume status appears stable-continue to hold Lasix  Dyslipidemia: Continue statin  GERD: Continue PPI  History of neurogenic bladder-status post chronic indwelling Foley catheter  History of spinal cord injury at C5-C7 level resulting in quadriplegia  History of autonomic dysfunction causing tachycardia and elevated blood pressure  Moisture associated skin damage in sacrum: Appreciate wound care input-continue with orders per wound care.  Unstageable pressure injury to left outer foot: Present prior to admission-per wound care.  Morning  labs/Imaging ordered: yes  DVT Prophylaxis: Prophylactic Lovenox   Code Status: Full code   Family Communication: None at bedside  Disposition Plan: Remain inpatient- SNF on discharge in the next few days  Antimicrobial agents: Anti-infectives    Start     Dose/Rate Route Frequency Ordered Stop   09/13/17 0400  cefTRIAXone (ROCEPHIN) 1 g in dextrose 5 % 50 mL IVPB     1 g 100 mL/hr over 30 Minutes Intravenous Every 24 hours 09/13/17 0256        Procedures: None  CONSULTS:  None  Time spent: 25- minutes-Greater than 50% of this time was spent in counseling, explanation of diagnosis, planning of further management, and coordination of care.  MEDICATIONS: Scheduled Meds: . acetaminophen  650 mg Oral TID  . amitriptyline  25 mg Oral QHS  . atorvastatin  40 mg Oral Daily  . baclofen  30 mg Oral TID  . cloNIDine  0.1 mg Oral TID  . darifenacin  7.5 mg Oral Daily  . enoxaparin (LOVENOX) injection  40 mg Subcutaneous Q24H  . ferrous sulfate  325 mg Oral BID WC  . gabapentin  300 mg Oral TID  . metoprolol tartrate  12.5 mg Oral BID  . multivitamin with minerals  1 tablet Oral Daily  . nicotine  21 mg Transdermal Daily  . pantoprazole  40 mg Oral Daily  . [START ON 09/14/2017] pneumococcal 23 valent vaccine  0.5 mL Intramuscular Tomorrow-1000  . saccharomyces boulardii  250 mg Oral BID  . cyanocobalamin  1,000 mcg Oral Daily  . ascorbic acid  500 mg Oral BID  . zinc sulfate  220 mg Oral Daily   Continuous Infusions: . sodium chloride 100 mL/hr at 09/13/17 0606  . cefTRIAXone (ROCEPHIN)  IV     PRN Meds:.bisacodyl,  hydrALAZINE, Melatonin, ondansetron (ZOFRAN) IV, opium-belladonna   PHYSICAL EXAM: Vital signs: Vitals:   09/13/17 0300 09/13/17 0331 09/13/17 0459 09/13/17 0500  BP: 91/70 (!) 128/96 (!) 163/93   Pulse: 94 79 88   Resp: 16 16 18    Temp:   98 F (36.7 C)   TempSrc:   Oral   SpO2: 95% 100% 95%   Weight:    90.6 kg (199 lb 11.8 oz)  Height:    5'  9" (1.753 m)   Filed Weights   09/13/17 0500  Weight: 90.6 kg (199 lb 11.8 oz)   Body mass index is 29.5 kg/m.   General appearance :Awake, alert, not in any distress.  Eyes:, pupils equally reactive to light and accomodation,no scleral icterus. HEENT: Atraumatic and Normocephalic Neck: supple, no JVD. No cervical lymphadenopathy.  Resp:Good air entry bilaterally, no added sounds  CVS: S1 S2 regular, no murmurs.  GI: Bowel sounds present, Non tender and not distended with no gaurding, rigidity or rebound.No organomegaly.Suprapubic catheter in place Extremities: B/L Lower Ext shows no edema, both legs are warm to touch Neurology:  Unable to move lower extremities-but able to lift his upper extremities-contractures in his fingers. Psychiatric:Alert and oriented x 3. Normal mood. Musculoskeletal:No digital cyanosis Skin:No Rash, warm and dry  I have personally reviewed following labs and imaging studies  LABORATORY DATA: CBC:  Recent Labs Lab 09/12/17 2230 09/13/17 0409  WBC 13.8* 18.0*  HGB 12.6* 13.7  HCT 38.8* 40.4  MCV 87.6 87.8  PLT 408* 417*    Basic Metabolic Panel:  Recent Labs Lab 09/12/17 2230 09/13/17 0409  NA 134* 134*  K 2.8* 3.2*  CL 91* 92*  CO2 25 23  GLUCOSE 133* 121*  BUN 47* 45*  CREATININE 1.77* 1.60*  CALCIUM 9.4 9.3  MG  --  2.1    GFR: Estimated Creatinine Clearance: 62.9 mL/min (A) (by C-G formula based on SCr of 1.6 mg/dL (H)).  Liver Function Tests: No results for input(s): AST, ALT, ALKPHOS, BILITOT, PROT, ALBUMIN in the last 168 hours. No results for input(s): LIPASE, AMYLASE in the last 168 hours. No results for input(s): AMMONIA in the last 168 hours.  Coagulation Profile: No results for input(s): INR, PROTIME in the last 168 hours.  Cardiac Enzymes: No results for input(s): CKTOTAL, CKMB, CKMBINDEX, TROPONINI in the last 168 hours.  BNP (last 3 results) No results for input(s): PROBNP in the last 8760  hours.  HbA1C: No results for input(s): HGBA1C in the last 72 hours.  CBG:  Recent Labs Lab 09/12/17 2225  GLUCAP 152*    Lipid Profile: No results for input(s): CHOL, HDL, LDLCALC, TRIG, CHOLHDL, LDLDIRECT in the last 72 hours.  Thyroid Function Tests: No results for input(s): TSH, T4TOTAL, FREET4, T3FREE, THYROIDAB in the last 72 hours.  Anemia Panel: No results for input(s): VITAMINB12, FOLATE, FERRITIN, TIBC, IRON, RETICCTPCT in the last 72 hours.  Urine analysis:    Component Value Date/Time   COLORURINE AMBER (A) 09/13/2017 0035   APPEARANCEUR CLOUDY (A) 09/13/2017 0035   LABSPEC 1.013 09/13/2017 0035   PHURINE 6.0 09/13/2017 0035   GLUCOSEU NEGATIVE 09/13/2017 0035   HGBUR MODERATE (A) 09/13/2017 0035   BILIRUBINUR NEGATIVE 09/13/2017 0035   KETONESUR 5 (A) 09/13/2017 0035   PROTEINUR 100 (A) 09/13/2017 0035   UROBILINOGEN 0.2 08/23/2015 0247   NITRITE NEGATIVE 09/13/2017 0035   LEUKOCYTESUR LARGE (A) 09/13/2017 0035    Sepsis Labs: Lactic Acid, Venous    Component Value Date/Time  LATICACIDVEN 0.87 09/13/2017 0204    MICROBIOLOGY: Recent Results (from the past 240 hour(s))  MRSA PCR Screening     Status: Abnormal   Collection Time: 09/13/17  6:38 AM  Result Value Ref Range Status   MRSA by PCR POSITIVE (A) NEGATIVE Final    Comment:        The GeneXpert MRSA Assay (FDA approved for NASAL specimens only), is one component of a comprehensive MRSA colonization surveillance program. It is not intended to diagnose MRSA infection nor to guide or monitor treatment for MRSA infections. RESULT CALLED TO, READ BACK BY AND VERIFIED WITH: RN Marlou Porch [phone] MLM     RADIOLOGY STUDIES/RESULTS: Dg Chest Portable 1 View  Result Date: 09/12/2017 CLINICAL DATA:  Quadriplegic patient with progressing lethargy. EXAM: PORTABLE CHEST 1 VIEW COMPARISON:  02/13/2016 FINDINGS: Shallow inspiration. Heart size and pulmonary vascularity are normal. No airspace  disease or consolidation in the lungs. No blunting of costophrenic angles. No pneumothorax. Mediastinal contours appear intact. Postoperative changes in the cervical spine. IMPRESSION: Shallow inspiration.  No evidence of active pulmonary disease. Electronically Signed   By: Lucienne Capers M.D.   On: 09/12/2017 22:53     LOS: 0 days   Oren Binet, MD  [facility] Pager:[phone]  If 7PM-7AM, please contact night-coverage [email or web address] Password [email or web address] 09/13/2017, 9:22 AM

## 2017-09-13 NOTE — H&P (Signed)
History and Physical    Phillip [name] UMP:[id number] DOB: [birth year] DOA: 09/12/2017  Referring MD/NP/PA:   PCP: Patient, No Pcp Per   Patient coming from:  The patient is coming from SNF.  At baseline, pt is dependent for most of ADL.   Chief Complaint: Generalized weakness and lethargy  HPI: Phillip [name] is a 51 y.o. male with medical history significant of seizure, depression, tobacco abuse, GERD, depression, hypertension, hyperlipidemia, Proteus septicemia, C-spinal cord injury (s/p of C-spine fusion 05/30/15), quadriplegia, indwelling Foley cath, who presents with generalized weakness and lethargy.  Pt states that he has been feeling weak for about one weak, which has been gradually getting worse. Patient denies chest pain, chest pain, cough, shortness breath, nausea, vomiting, diarrhea, abdominal pain. He states that he he has heartburn. He has indwelling Foley cath which was changed two days ago. He states that he has hematuria.   ED Course: pt was found to have WBC 13.8, lactic acid 1.43, 0.87, positive urinalysis with large amount of leukocytes, potassium 2.8, acute renal injury with creatinine 1.77, temperature normal, heart rate in 90s, respiration rate 23, oxygen saturation 94% on room air. Blood pressure is elevated at 193/118. Chest x-rays negative for infiltration. Patient is admitted to telemetry bed as inpatient.  Review of Systems:   General: no fevers, chills, no body weight gain, has poor appetite, has fatigue HEENT: no blurry vision, hearing changes or sore throat Respiratory: no dyspnea, coughing, wheezing CV: no chest pain, no palpitations GI: no nausea, vomiting, abdominal pain, diarrhea, constipation GU: no dysuria, burning on urination, increased urinary frequency, has hematuria  Ext: no leg edema Neuro: has quadriplegia Skin: no rash MSK: No muscle spasm, no deformity, no limitation of range of movement in spin Heme: No easy bruising.  Travel  history: No recent long distant travel.  Allergy: No Known Allergies  Past Medical History:  Diagnosis Date  . Ankle fracture, right 06/03/2015  . Broken hip ([place])   . Cervical spinal cord injury ([place])   . DJD (degenerative joint disease)   . Hip fracture requiring operative repair ([place]) 10/09/2014  . 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 Proteus septicemia ([place])   . Seizures ([place])   . Tobacco abuse     Past Surgical History:  Procedure Laterality Date  . APPLICATION OF A-CELL OF CHEST/ABDOMEN N/A 08/24/2015   Procedure: PLACEMENT OF A-CELL ANd Wound  VAC;  Surgeon: Theodoro Kos, DO;  Location: [place];  Service: Plastics;  Laterality: N/A;  . APPLICATION OF WOUND VAC Right 11/11/2015   Procedure: APPLICATION OF WOUND VAC;  Surgeon: Meredith Pel, MD;  Location: [place];  Service: Orthopedics;  Laterality: Right;  . CYSTOSCOPY N/A 08/09/2016   Procedure: CYSTOSCOPY;  Surgeon: Raynelle Bring, MD;  Location: [facility];  Service: Urology;  Laterality: N/A;  . HARDWARE REMOVAL Right 11/11/2015   Procedure: HARDWARE REMOVAL RIGHT ANKLE;  Surgeon: Meredith Pel, MD;  Location: [place];  Service: Orthopedics;  Laterality: Right;  . I&D EXTREMITY Right 11/11/2015   Procedure: IRRIGATION AND DEBRIDEMENT RIGHT ANKLE;  Surgeon: Meredith Pel, MD;  Location: [place];  Service: Orthopedics;  Laterality: Right;  . INCISION AND DRAINAGE OF WOUND N/A 08/24/2015   Procedure: IRRIGATION AND DEBRIDEMENT SACRAL DECUBITUS ULCER ;  Surgeon: Theodoro Kos, DO;  Location: [place];  Service: Plastics;  Laterality: N/A;  . INSERTION OF SUPRAPUBIC CATHETER N/A 08/09/2016   Procedure: INSERTION OF SUPRAPUBIC CATHETER;  Surgeon: Raynelle Bring, MD;  Location: [facility];  Service: Urology;  Laterality: N/A;  . INTRAMEDULLARY (IM) NAIL INTERTROCHANTERIC Right 10/09/2014   Procedure: INTRAMEDULLARY (IM) NAIL INTERTROCHANTRIC Right knee aspiration;  Surgeon: Melina Schools, MD;  Location: [facility];  Service:  Orthopedics;  Laterality: Right;  . KNEE SURGERY     right knee surgery 1988  . ORIF ANKLE FRACTURE Right 05/26/2015   Procedure: OPEN REDUCTION INTERNAL FIXATION (ORIF) ANKLE FRACTURE;  Surgeon: Meredith Pel, MD;  Location: [place];  Service: Orthopedics;  Laterality: Right;  . POSTERIOR CERVICAL FUSION/FORAMINOTOMY N/A 05/30/2015   Procedure: C3-C7 decompressive laminectomy with posterior cervical fusion utilizing lateral mass instrumentation and local bone grafting;  Surgeon: Earnie Larsson, MD;  Location: [facility];  Service: Neurosurgery;  Laterality: N/A;    Social History:  reports that he has been smoking Cigarettes.  He has a 0.05 pack-year smoking history. He has never used smokeless tobacco. He reports that he does not drink alcohol or use drugs.  Family History:  Family History  Problem Relation Age of Onset  . Hypertension Mother      Prior to Admission medications   Medication Sig Start Date End Date Taking? Authorizing Provider  acetaminophen (TYLENOL) 325 MG tablet Take 650 mg by mouth 3 (three) times daily.   Yes [provider]  amitriptyline (ELAVIL) 25 MG tablet Take 25 mg by mouth at bedtime.   Yes [provider]  atorvastatin (LIPITOR) 40 MG tablet Take 40 mg by mouth daily.   Yes [provider]  baclofen (LIORESAL) 10 MG tablet Take 1 tablet (10 mg total) by mouth 3 (three) times daily. Patient taking differently: Take 10 mg by mouth 3 (three) times daily. Take with baclofen 20 mg to equal 30 mg 12/29/15  Yes Liberty Handy, MD  baclofen (LIORESAL) 20 MG tablet Take 20 mg by mouth 3 (three) times daily. Take with baclofen 10 mg to equal 30 mg   Yes [provider]  bisacodyl (DULCOLAX) 10 MG suppository Place 10 mg rectally every 8 (eight) hours as needed for moderate constipation.    Yes [provider]  Ciprofloxacin-Ciproflox HCl (CIPRO XR) 500 MG tablet Take 500 mg by mouth See admin instructions. Daily for 3 days  (started on 09/11/17 then discontinued on 09/12/17 to start Cipro 500 mg on 09/13/17 for 7 days)   Yes [provider]  cloNIDine (CATAPRES) 0.1 MG tablet Take 0.1 mg by mouth 3 (three) times daily.   Yes [provider]  ferrous sulfate 325 (65 FE) MG tablet Take 1 tablet (325 mg total) by mouth 2 (two) times daily with a meal. 08/26/15  Yes Rai, Ripudeep K, MD  furosemide (LASIX) 40 MG tablet Take 40 mg by mouth daily.   Yes [provider]  gabapentin (NEURONTIN) 300 MG capsule Take 300 mg by mouth 3 (three) times daily.   Yes [provider]  Infant Care Products (DERMACLOUD) CREA Apply topically 3 (three) times daily. To left inner thigh   Yes [provider]  Melatonin 3 MG TABS Take 3-6 mg by mouth at bedtime as needed (for insomnia).   Yes [provider]  Menthol, Topical Analgesic, (BIOFREEZE) 4 % GEL Apply topically 3 (three) times daily. Every 10 days for pain   Yes [provider]  metoprolol tartrate (LOPRESSOR) 25 MG tablet Take 0.5 tablets (12.5 mg total) by mouth 2 (two) times daily. 08/26/15  Yes Rai, Ripudeep K, MD  Multiple Vitamin (MULTIVITAMIN WITH MINERALS) TABS tablet Take  1 tablet by mouth daily. 06/17/15  Yes Verlee Monte, MD  ondansetron (ZOFRAN) 4 MG tablet Take 1 tablet (4 mg total) by mouth every 6 (six) hours as needed for nausea. 08/26/15  Yes Rai, Ripudeep K, MD  opium-belladonna (B&O SUPPRETTES) 16.2-60 MG suppository Place 1 suppository rectally every 8 (eight) hours as needed for bladder spasms. 06/03/16  Yes Rama, Venetia Maxon, MD  pantoprazole (PROTONIX) 40 MG tablet Take 1 tablet (40 mg total) by mouth daily. 12/02/15  Yes Rice, Resa Miner, MD  saccharomyces boulardii (FLORASTOR) 250 MG capsule Take 1 capsule (250 mg total) by mouth 2 (two) times daily. 06/17/15  Yes Verlee Monte, MD  solifenacin (VESICARE) 5 MG tablet Take 5 mg by mouth daily.   Yes [provider]  vitamin B-12 1000 MCG tablet  Take 1 tablet (1,000 mcg total) by mouth daily. 06/17/15  Yes Verlee Monte, MD  vitamin C (VITAMIN C) 500 MG tablet Take 1 tablet (500 mg total) by mouth 2 (two) times daily. 08/26/15  Yes Rai, Ripudeep K, MD  zinc sulfate 220 MG capsule Take 1 capsule (220 mg total) by mouth daily. Patient taking differently: Take 220 mg by mouth at bedtime.  08/26/15  Yes Rai, Ripudeep K, MD  oxybutynin (DITROPAN) 5 MG tablet Take 1 tablet (5 mg total) by mouth 3 (three) times daily. Patient not taking: Reported on 09/12/2017 06/03/16   Rama, Venetia Maxon, MD  simethicone (MYLICON) 40 JO/8.4ZY drops Take 0.6 mLs (40 mg total) by mouth 4 (four) times daily as needed for flatulence. Patient not taking: Reported on 09/12/2017 02/19/16   Debbe Odea, MD  zolpidem (AMBIEN) 5 MG tablet Take 1 tablet (5 mg total) by mouth at bedtime as needed for sleep. Patient not taking: Reported on 09/12/2017 06/03/16   Rama, Venetia Maxon, MD    Physical Exam: Vitals:   09/13/17 0102 09/13/17 0145 09/13/17 0200 09/13/17 0225  BP: 125/79  101/60   Pulse: 94 92 94   Resp: 14 (!) 23 11   Temp:    97.9 F (36.6 C)  TempSrc:    Oral  SpO2: 98% 98% 100%    General: Not in acute distress HEENT:       Eyes: PERRL, EOMI, no scleral icterus.       ENT: No discharge from the ears and nose, no pharynx injection, no tonsillar enlargement.        Neck: No JVD, no bruit, no mass felt. Heme: No neck lymph node enlargement. Cardiac: S1/S2, RRR, No murmurs, No gallops or rubs. Respiratory: No rales, wheezing, rhonchi or rubs. GI: Soft, nondistended, nontender, no rebound pain, no organomegaly, BS present. GU: No hematuria Ext: No pitting leg edema bilaterally. 2+DP/PT pulse bilaterally. Musculoskeletal: No joint deformities, No joint redness or warmth, no limitation of ROM in spin. Skin: No rashes.  Neuro: pt is lethargic, but oriented X3, cranial nerves II-XII grossly intact. Has Quadriplegia Psych: Patient is not psychotic, no suicidal or  hemocidal ideation.  Labs on Admission: I have personally reviewed following labs and imaging studies  CBC:  Recent Labs Lab 09/12/17 2230  WBC 13.8*  HGB 12.6*  HCT 38.8*  MCV 87.6  PLT 606*   Basic Metabolic Panel:  Recent Labs Lab 09/12/17 2230  NA 134*  K 2.8*  CL 91*  CO2 25  GLUCOSE 133*  BUN 47*  CREATININE 1.77*  CALCIUM 9.4   GFR: CrCl cannot be calculated (Unknown ideal weight.). Liver Function Tests: No results for input(s):  AST, ALT, ALKPHOS, BILITOT, PROT, ALBUMIN in the last 168 hours. No results for input(s): LIPASE, AMYLASE in the last 168 hours. No results for input(s): AMMONIA in the last 168 hours. Coagulation Profile: No results for input(s): INR, PROTIME in the last 168 hours. Cardiac Enzymes: No results for input(s): CKTOTAL, CKMB, CKMBINDEX, TROPONINI in the last 168 hours. BNP (last 3 results) No results for input(s): PROBNP in the last 8760 hours. HbA1C: No results for input(s): HGBA1C in the last 72 hours. CBG:  Recent Labs Lab 09/12/17 2225  GLUCAP 152*   Lipid Profile: No results for input(s): CHOL, HDL, LDLCALC, TRIG, CHOLHDL, LDLDIRECT in the last 72 hours. Thyroid Function Tests: No results for input(s): TSH, T4TOTAL, FREET4, T3FREE, THYROIDAB in the last 72 hours. Anemia Panel: No results for input(s): VITAMINB12, FOLATE, FERRITIN, TIBC, IRON, RETICCTPCT in the last 72 hours. Urine analysis:    Component Value Date/Time   COLORURINE AMBER (A) 09/13/2017 0035   APPEARANCEUR CLOUDY (A) 09/13/2017 0035   LABSPEC 1.013 09/13/2017 0035   PHURINE 6.0 09/13/2017 0035   GLUCOSEU NEGATIVE 09/13/2017 0035   HGBUR MODERATE (A) 09/13/2017 0035   BILIRUBINUR NEGATIVE 09/13/2017 0035   KETONESUR 5 (A) 09/13/2017 0035   PROTEINUR 100 (A) 09/13/2017 0035   UROBILINOGEN 0.2 08/23/2015 0247   NITRITE NEGATIVE 09/13/2017 0035   LEUKOCYTESUR LARGE (A) 09/13/2017 0035   Sepsis Labs: @LABRCNTIP (procalcitonin:4,lacticidven:4) )No  results found for this or any previous visit (from the past 240 hour(s)).   Radiological Exams on Admission: Dg Chest Portable 1 View  Result Date: 09/12/2017 CLINICAL DATA:  Quadriplegic patient with progressing lethargy. EXAM: PORTABLE CHEST 1 VIEW COMPARISON:  02/13/2016 FINDINGS: Shallow inspiration. Heart size and pulmonary vascularity are normal. No airspace disease or consolidation in the lungs. No blunting of costophrenic angles. No pneumothorax. Mediastinal contours appear intact. Postoperative changes in the cervical spine. IMPRESSION: Shallow inspiration.  No evidence of active pulmonary disease. Electronically Signed   By: Lucienne Capers M.D.   On: 09/12/2017 22:53     EKG:  Not done in ED, will get one.   Assessment/Plan Principal Problem:   AKI (acute kidney injury) ([place]) Active Problems:   Tobacco abuse   Quadriplegia (HCC)   UTI (urinary tract infection)   Essential hypertension   Hypokalemia   HLD (hyperlipidemia)   GERD (gastroesophageal reflux disease)   Depression   Chronic diastolic CHF (congestive heart failure) (HCC)   AKI: Cre 1.77 and BNU 47. Likely due to prerenal secondary to dehydration and continuation of  Diuretics. UTI may have also contributed partially  -Admit to tele bed as inpt - IVF: 1L NS, then 100 cc/h - Check  FeUrea - Follow up renal function by BMP - Hold lasix  Hx of Quadriplegia due to C spinal cord injury:  -continue beclofen  Possible UTI (urinary tract infection): Patient has generalized weakness and lethargy, also has leukocytosis. His urinalysis is positive with large amount of leukocyte indicating possible UTI, though patient may have colonized bacteria due to indwelling catheter -start rocephin IV -f/u Bx and Ux  Essential hypertension: Bp 193/118. -Hold lasix due to AKI -continue metoprolol, clonidine -IV hydralazine when necessary  Hypokalemia: K=2.8 on admission. - Repleted with total of 90 mEQ of KCl - give 1 g  for magnesium sulfate - Check Mg level  HLD: -lipitor  GERD: -Protonix  Depression: -continue amitriptyline  Chronic diastolic CHF (congestive heart failure) ([place]): 2-D echo on 06/03/15 showed EF 65-70 percent with grade 1 diastolic dysfunction. Patient  does not have leg edema or JVD. No respiratory distress. CHF seems to be compensated. -Hold Lasix and -Continue metoprolol -Check BNP  Tobacco abuse: -Did counseling about importance of quitting smoking -Nicotine patch   DVT ppx: SQ Lovenox Code Status: Full code Family Communication: None at bed side.  Disposition Plan:  Anticipate discharge back to previous SNF Consults called:  none Admission status:   Inpatient/tele     Date of Service 09/13/2017    Ivor Costa [facility] Pager [phone]  If 7PM-7AM, please contact night-coverage [email or web address] Password [email or web address] 09/13/2017, 3:07 AM

## 2017-09-13 NOTE — Progress Notes (Signed)
Pt admitted to [facility] by stretcher from ER. Pt is alert and orientedx4 on arrival but drowsy currently. Vss, BP slightly elevated, WCTM. Tele placed on. Pt oriented to unit/room/equipment/rules. White bracelet on with the correct identifiers. Skin check completed with second RN (MS). Multiple pressure injuries noted, please refer to skin assessment. Wound nurse consult placed. WCTM

## 2017-09-13 NOTE — Progress Notes (Signed)
CM received consult for LTAC. Pt with no inpatient ICU days, will not qualify for LTAC. Plan is to d/c back to SNF @ d/c. Whitman Hero RN,BSN,CM

## 2017-09-14 DIAGNOSIS — I10 Essential (primary) hypertension: Secondary | ICD-10-CM

## 2017-09-14 DIAGNOSIS — I5032 Chronic diastolic (congestive) heart failure: Secondary | ICD-10-CM

## 2017-09-14 DIAGNOSIS — K210 Gastro-esophageal reflux disease with esophagitis: Secondary | ICD-10-CM

## 2017-09-14 DIAGNOSIS — N179 Acute kidney failure, unspecified: Principal | ICD-10-CM

## 2017-09-14 LAB — BASIC METABOLIC PANEL
Anion gap: 13 (ref 5–15)
BUN: 36 mg/dL — ABNORMAL HIGH (ref 6–20)
CHLORIDE: 100 mmol/L — AB (ref 101–111)
CO2: 24 mmol/L (ref 22–32)
Calcium: 9 mg/dL (ref 8.9–10.3)
Creatinine, Ser: 1.06 mg/dL (ref 0.61–1.24)
Glucose, Bld: 132 mg/dL — ABNORMAL HIGH (ref 65–99)
POTASSIUM: 3.3 mmol/L — AB (ref 3.5–5.1)
SODIUM: 137 mmol/L (ref 135–145)

## 2017-09-14 LAB — UREA NITROGEN, URINE: UREA NITROGEN UR: 427 mg/dL

## 2017-09-14 LAB — URINE CULTURE

## 2017-09-14 LAB — CBC
HEMATOCRIT: 37 % — AB (ref 39.0–52.0)
Hemoglobin: 12.4 g/dL — ABNORMAL LOW (ref 13.0–17.0)
MCH: 29.8 pg (ref 26.0–34.0)
MCHC: 33.5 g/dL (ref 30.0–36.0)
MCV: 88.9 fL (ref 78.0–100.0)
PLATELETS: 446 10*3/uL — AB (ref 150–400)
RBC: 4.16 MIL/uL — AB (ref 4.22–5.81)
RDW: 13.7 % (ref 11.5–15.5)
WBC: 10.9 10*3/uL — AB (ref 4.0–10.5)

## 2017-09-14 MED ORDER — POTASSIUM CHLORIDE CRYS ER 20 MEQ PO TBCR
40.0000 meq | EXTENDED_RELEASE_TABLET | Freq: Once | ORAL | Status: AC
  Administered 2017-09-14: 40 meq via ORAL
  Filled 2017-09-14: qty 2

## 2017-09-14 NOTE — Progress Notes (Signed)
PROGRESS NOTE        PATIENT DETAILS Name: Phillip [name] Age: 51 y.o. Sex: male Date of Birth: [birth year] Admit Date: 09/12/2017 Admitting Physician Ivor Costa, MD PYP:[id number], No Pcp Per  Brief Narrative: Patient is a 51 y.o. 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 Admitted for further evaluation and treatment, see below for further details.  Subjective: -continues to feel better, denies any cough congestion, -Had a bowel movement this morning  Assessment/Plan:  Acute kidney injury:  -improving with hydration, Due to dehydration, diuretics and increased metabolic demands in the setting of infection -creatinine has normalized now will stop IV fluids today -diuretics on hold  Possible urinary tract infection versus chronic colonization -Given weakness, lethargy, and the setting of abnormal UA and leukocytosis -Continue IV ceftriaxone today, follow-up urine cultures  Hypokalemia:  -repleted  Hypertension: -stable now, continue clonidine, metoprolol  Chronic diastolic heart failure:  -appears euvolemic, stop fluids today, resume Lasix tomorrow  Dyslipidemia: Continue statin  GERD: Continue PPI  History of neurogenic bladder-status post chronic indwelling Foley catheter -suprapubic catheter changed monthly last changed on 9/30  History of 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  Unstageable pressure injury  to left outer foot:  -present prior to admission -continue wound care per [facility] RN recs  Morning labs/Imaging ordered: yes  DVT Prophylaxis: Prophylactic Lovenox   Code Status:Full code  Family Communication:None at bedside Disposition Plan:SNF tomorrow if stable  Antimicrobial agents: Anti-infectives    Start     Dose/Rate Route Frequency Ordered Stop   09/13/17 0400  cefTRIAXone (ROCEPHIN) 1 g in dextrose 5 % 50 mL IVPB     1 g 100 mL/hr over 30 Minutes Intravenous Every 24 hours 09/13/17 0256        Procedures: None  CONSULTS:  None  Time spent: 25- minutes-Greater than 50% of this time was spent in counseling, explanation of diagnosis, planning of further management, and coordination of care.  MEDICATIONS: Scheduled Meds: . acetaminophen  650 mg Oral TID  . amitriptyline  25 mg Oral QHS  . atorvastatin  40 mg Oral Daily  . baclofen  30 mg Oral TID  . Chlorhexidine Gluconate Cloth  6 each Topical Q0600  . cloNIDine  0.1 mg Oral TID  . collagenase   Topical Daily  . darifenacin  7.5 mg Oral Daily  . enoxaparin (LOVENOX) injection  40 mg Subcutaneous Q24H  . ferrous sulfate  325 mg Oral BID WC  . gabapentin  300 mg Oral TID  . metoprolol tartrate  12.5 mg Oral BID  . multivitamin with minerals  1 tablet Oral Daily  . mupirocin ointment  1 application Nasal BID  . nicotine  21 mg Transdermal Daily  . pantoprazole  40 mg Oral Daily  . pneumococcal 23 valent vaccine  0.5 mL Intramuscular Tomorrow-1000  . saccharomyces boulardii  250 mg Oral BID  . cyanocobalamin  1,000 mcg Oral Daily  . ascorbic  acid  500 mg Oral BID  . zinc sulfate  220 mg Oral Daily   Continuous Infusions: . sodium chloride 75 mL/hr at 09/13/17 2214  . cefTRIAXone (ROCEPHIN)  IV Stopped (09/14/17 0537)   PRN Meds:.bisacodyl, hydrALAZINE, Melatonin, ondansetron (ZOFRAN) IV, opium-belladonna   PHYSICAL EXAM: Vital signs: Vitals:   09/13/17 0500 09/13/17 1404 09/13/17 2200 09/14/17 0500    BP:  (!) 107/48 (!) 147/48 (!) 140/110  Pulse:  84 89 72  Resp:  16 18 20   Temp:   97.7 F (36.5 C) 97.6 F (36.4 C)  TempSrc:   Oral Oral  SpO2:  98% 99% 100%  Weight: 90.6 kg (199 lb 11.8 oz)   94.3 kg (208 lb)  Height: 5\' 9"  (1.753 m)      Filed Weights   09/13/17 0500 09/14/17 0500  Weight: 90.6 kg (199 lb 11.8 oz) 94.3 kg (208 lb)   Body mass index is 30.72 kg/m.   Gen: pleasant, chronically debilitated quadriplegic male, Awake, Alert, Oriented X 3,  HEENT: PERRLA, Neck supple, no JVD Lungs: diminished BS at bases CVS: RRR,No Gallops,Rubs or new Murmurs Abd: soft, obese, distended, suprapubic catheter noted Extremities: quadriplegic with contractures and decreased muscle tone/atrophy Skin: nstageable pressure ulcers over left foot and left knee  I have personally reviewed following labs and imaging studies  LABORATORY DATA: CBC:  Recent Labs Lab 09/12/17 2230 09/13/17 0409 09/14/17 0344  WBC 13.8* 18.0* 10.9*  HGB 12.6* 13.7 12.4*  HCT 38.8* 40.4 37.0*  MCV 87.6 87.8 88.9  PLT 408* 417* 446*    Basic Metabolic Panel:  Recent Labs Lab 09/12/17 2230 09/13/17 0409 09/14/17 0344  NA 134* 134* 137  K 2.8* 3.2* 3.3*  CL 91* 92* 100*  CO2 25 23 24   GLUCOSE 133* 121* 132*  BUN 47* 45* 36*  CREATININE 1.77* 1.60* 1.06  CALCIUM 9.4 9.3 9.0  MG  --  2.1  --     GFR: Estimated Creatinine Clearance: 96.6 mL/min (by C-G formula based on SCr of 1.06 mg/dL).  Liver Function Tests: No results for input(s): AST, ALT, ALKPHOS, BILITOT, PROT, ALBUMIN in the last 168 hours. No results for input(s): LIPASE, AMYLASE in the last 168 hours. No results for input(s): AMMONIA in the last 168 hours.  Coagulation Profile: No results for input(s): INR, PROTIME in the last 168 hours.  Cardiac Enzymes: No results for input(s): CKTOTAL, CKMB, CKMBINDEX, TROPONINI in the last 168 hours.  BNP (last 3 results) No results for input(s): PROBNP in the last 8760  hours.  HbA1C: No results for input(s): HGBA1C in the last 72 hours.  CBG:  Recent Labs Lab 09/12/17 2225  GLUCAP 152*    Lipid Profile: No results for input(s): CHOL, HDL, LDLCALC, TRIG, CHOLHDL, LDLDIRECT in the last 72 hours.  Thyroid Function Tests: No results for input(s): TSH, T4TOTAL, FREET4, T3FREE, THYROIDAB in the last 72 hours.  Anemia Panel: No results for input(s): VITAMINB12, FOLATE, FERRITIN, TIBC, IRON, RETICCTPCT in the last 72 hours.  Urine analysis:    Component Value Date/Time   COLORURINE AMBER (A) 09/13/2017 0035   APPEARANCEUR CLOUDY (A) 09/13/2017 0035   LABSPEC 1.013 09/13/2017 0035   PHURINE 6.0 09/13/2017 0035   GLUCOSEU NEGATIVE 09/13/2017 0035   HGBUR MODERATE (A) 09/13/2017 0035   BILIRUBINUR NEGATIVE 09/13/2017 0035   KETONESUR 5 (A) 09/13/2017 0035   PROTEINUR 100 (A) 09/13/2017 0035   UROBILINOGEN 0.2 08/23/2015 0247   NITRITE NEGATIVE 09/13/2017 0035   LEUKOCYTESUR LARGE (  A) 09/13/2017 0035    Sepsis Labs: Lactic Acid, Venous    Component Value Date/Time   LATICACIDVEN 0.87 09/13/2017 0204    MICROBIOLOGY: Recent Results (from the past 240 hour(s))  Urine culture     Status: Abnormal   Collection Time: 09/13/17 12:35 AM  Result Value Ref Range Status   Specimen Description URINE, RANDOM  Final   Special Requests NONE  Final   Culture MULTIPLE SPECIES PRESENT, SUGGEST RECOLLECTION (A)  Final   Report Status 09/14/2017 FINAL  Final  MRSA PCR Screening     Status: Abnormal   Collection Time: 09/13/17  6:38 AM  Result Value Ref Range Status   MRSA by PCR POSITIVE (A) NEGATIVE Final    Comment:        The GeneXpert MRSA Assay (FDA approved for NASAL specimens only), is one component of a comprehensive MRSA colonization surveillance program. It is not intended to diagnose MRSA infection nor to guide or monitor treatment for MRSA infections. RESULT CALLED TO, READ BACK BY AND VERIFIED WITH: RN Marlou Porch [phone] [id number]      RADIOLOGY STUDIES/RESULTS: Dg Chest Portable 1 View  Result Date: 09/12/2017 CLINICAL DATA:  Quadriplegic patient with progressing lethargy. EXAM: PORTABLE CHEST 1 VIEW COMPARISON:  02/13/2016 FINDINGS: Shallow inspiration. Heart size and pulmonary vascularity are normal. No airspace disease or consolidation in the lungs. No blunting of costophrenic angles. No pneumothorax. Mediastinal contours appear intact. Postoperative changes in the cervical spine. IMPRESSION: Shallow inspiration.  No evidence of active pulmonary disease. Electronically Signed   By: Lucienne Capers M.D.   On: 09/12/2017 22:53     LOS: 1 day   Domenic Polite, MD  [facility] Page via [email or web address], password [email or web address]  If 7PM-7AM, please contact night-coverage [email or web address] Password [email or web address] 09/14/2017, 11:05 AM

## 2017-09-15 LAB — CBC
HEMATOCRIT: 36.6 % — AB (ref 39.0–52.0)
HEMOGLOBIN: 12.1 g/dL — AB (ref 13.0–17.0)
MCH: 29.3 pg (ref 26.0–34.0)
MCHC: 33.1 g/dL (ref 30.0–36.0)
MCV: 88.6 fL (ref 78.0–100.0)
Platelets: 491 10*3/uL — ABNORMAL HIGH (ref 150–400)
RBC: 4.13 MIL/uL — AB (ref 4.22–5.81)
RDW: 13.5 % (ref 11.5–15.5)
WBC: 10.1 10*3/uL (ref 4.0–10.5)

## 2017-09-15 LAB — BASIC METABOLIC PANEL
ANION GAP: 12 (ref 5–15)
BUN: 21 mg/dL — ABNORMAL HIGH (ref 6–20)
CALCIUM: 8.9 mg/dL (ref 8.9–10.3)
CHLORIDE: 98 mmol/L — AB (ref 101–111)
CO2: 26 mmol/L (ref 22–32)
Creatinine, Ser: 0.76 mg/dL (ref 0.61–1.24)
GFR calc non Af Amer: >60 mL/min (ref >60)
GLUCOSE: 135 mg/dL — AB (ref 65–99)
POTASSIUM: 3.3 mmol/L — AB (ref 3.5–5.1)
Sodium: 136 mmol/L (ref 135–145)

## 2017-09-15 MED ORDER — POTASSIUM CHLORIDE CRYS ER 20 MEQ PO TBCR
40.0000 meq | EXTENDED_RELEASE_TABLET | Freq: Once | ORAL | Status: AC
  Administered 2017-09-15: 40 meq via ORAL

## 2017-09-15 MED ORDER — FUROSEMIDE 20 MG PO TABS: 20.0000 mg | ORAL_TABLET | Freq: Every day | ORAL | Status: AC

## 2017-09-15 NOTE — Clinical Social Work Note (Signed)
Clinical Social Worker facilitated patient discharge including contacting patient and facility to confirm patient discharge plans.  Clinical information faxed to facility and agreeable with plan.  CSW arranged ambulance transport via [facility] to [facility].  RN to call report prior to discharge.  Clinical Social Worker will sign off for now as social work intervention is no longer needed. Please consult us again if new need arises.  Jesse Leland Staszewski, LCSW [phone] 

## 2017-09-15 NOTE — Progress Notes (Signed)
Nsg Discharge Note  Admit Date:  09/12/2017 Discharge date: 09/15/2017   Phillip [name] to be D/C'd Nursing Home per MD order.  AVS completed.  Copy for chart, and copy for patient signed, and dated. Patient/caregiver able to verbalize understanding.  Discharge Medication: Allergies as of 09/15/2017   No Known Allergies     Medication List    STOP taking these medications   CIPRO XR 500 MG tablet Generic drug:  Ciprofloxacin-Ciproflox HCl   oxybutynin 5 MG tablet Commonly known as:  DITROPAN   simethicone 40 MG/0.6ML drops Commonly known as:  MYLICON   zolpidem 5 MG tablet Commonly known as:  AMBIEN     TAKE these medications   acetaminophen 325 MG tablet Commonly known as:  TYLENOL Take 650 mg by mouth 3 (three) times daily.   amitriptyline 25 MG tablet Commonly known as:  ELAVIL Take 25 mg by mouth at bedtime.   ascorbic acid 500 MG tablet Commonly known as:  VITAMIN C Take 1 tablet (500 mg total) by mouth 2 (two) times daily.   atorvastatin 40 MG tablet Commonly known as:  LIPITOR Take 40 mg by mouth daily.   baclofen 20 MG tablet Commonly known as:  LIORESAL Take 20 mg by mouth 3 (three) times daily. Take with baclofen 10 mg to equal 30 mg What changed:  Another medication with the same name was changed. Make sure you understand how and when to take each.   baclofen 10 MG tablet Commonly known as:  LIORESAL Take 1 tablet (10 mg total) by mouth 3 (three) times daily. What changed:  additional instructions   BIOFREEZE 4 % Gel Generic drug:  Menthol (Topical Analgesic) Apply topically 3 (three) times daily. Every 10 days for pain   bisacodyl 10 MG suppository Commonly known as:  DULCOLAX Place 10 mg rectally every 8 (eight) hours as needed for moderate constipation.   cloNIDine 0.1 MG tablet Commonly known as:  CATAPRES Take 0.1 mg by mouth 3 (three) times daily.   cyanocobalamin 1000 MCG tablet Take 1 tablet (1,000 mcg total) by mouth daily.    DERMACLOUD Crea Apply topically 3 (three) times daily. To left inner thigh   ferrous sulfate 325 (65 FE) MG tablet Take 1 tablet (325 mg total) by mouth 2 (two) times daily with a meal.   furosemide 20 MG tablet Commonly known as:  LASIX Take 1 tablet (20 mg total) by mouth daily. What changed:  medication strength  how much to take   gabapentin 300 MG capsule Commonly known as:  NEURONTIN Take 300 mg by mouth 3 (three) times daily.   Melatonin 3 MG Tabs Take 3-6 mg by mouth at bedtime as needed (for insomnia).   metoprolol tartrate 25 MG tablet Commonly known as:  LOPRESSOR Take 0.5 tablets (12.5 mg total) by mouth 2 (two) times daily.   multivitamin with minerals Tabs tablet Take 1 tablet by mouth daily.   ondansetron 4 MG tablet Commonly known as:  ZOFRAN Take 1 tablet (4 mg total) by mouth every 6 (six) hours as needed for nausea.   opium-belladonna 16.2-60 MG suppository Commonly known as:  B&O SUPPRETTES Place 1 suppository rectally every 8 (eight) hours as needed for bladder spasms.   pantoprazole 40 MG tablet Commonly known as:  PROTONIX Take 1 tablet (40 mg total) by mouth daily.   saccharomyces boulardii 250 MG capsule Commonly known as:  FLORASTOR Take 1 capsule (250 mg total) by mouth 2 (two) times daily.  solifenacin 5 MG tablet Commonly known as:  VESICARE Take 5 mg by mouth daily.   zinc sulfate 220 (50 Zn) MG capsule Take 1 capsule (220 mg total) by mouth daily. What changed:  when to take this       Discharge Assessment: Vitals:   09/15/17 0510 09/15/17 0923  BP: (!) 95/54 (!) 100/59  Pulse: 89 88  Resp: 16   Temp: 98.3 F (36.8 C)   SpO2: 99%    Skin clean, dry and intact without evidence of skin break down, no evidence of skin tears noted. IV catheter discontinued intact. Site without signs and symptoms of complications - no redness or edema noted at insertion site, patient denies c/o pain - only slight tenderness at site.   Dressing with slight pressure applied.  D/c Instructions-Education: Discharge instructions given to patient/family with verbalized understanding. D/c education completed with patient/family including follow up instructions, medication list, d/c activities limitations if indicated, with other d/c instructions as indicated by MD - patient able to verbalize understanding, all questions fully answered. Patient instructed to return to ED, call 911, or call MD for any changes in condition.  Patient transported via [facility] to [facility].  Salley Slaughter, RN 09/15/2017 4:24 PM

## 2017-09-15 NOTE — Clinical Social Work Note (Signed)
Clinical Social Work Assessment  Patient Details  Name: Phillip [name] MRN: [id number] Date of Birth: 01/04/1966  Date of referral:  09/15/17               Reason for consult:  Facility Placement                Permission sought to share information with:    Permission granted to share information::  Yes, Verbal Permission Granted  Name::        Agency::     Relationship::     Contact Information:     Housing/Transportation Living arrangements for the past 2 months:  Skilled Nursing Facility Source of Information:  Patient Patient Interpreter Needed:  None Criminal Activity/Legal Involvement Pertinent to Current Situation/Hospitalization:  No - Comment as needed Significant Relationships:  Other(Comment) (Not assessed ) Lives with:  Self Do you feel safe going back to the place where you live?  Yes Need for family participation in patient care:  No (Coment)  Care giving concerns:  Patient is a quadriplegic.     Social Worker assessment / plan:  CSW met with patient to ensure that patient desired to return to [facility]. [facility] presented at [facility] for progressing lethargy. Patient in agreement to go back to [facility] and expressed readiness to leave hospital. CSW informed patient that contact would be made with facility and transportation would be set up for him to return to facility.  Employment status:  Unemployed Insurance information:  Medicaid In [place] PT Recommendations:    Information / Referral to community resources:  Skilled Nursing Facility  Patient/Family's Response to care:  No family present at bedside, patient understanding and accepting of plan to return to [facility].   Patient/Family's Understanding of and Emotional Response to Diagnosis, Current Treatment, and Prognosis:  Patient is returning to [facility], no issues or concerns with plan.  Emotional Assessment Appearance:  Appears older than stated age Attitude/Demeanor/Rapport:     Affect (typically observed):  Accepting Orientation:  Oriented to Self, Oriented to  Time, Oriented to Place, Oriented to Situation Alcohol / Substance use:  Not Applicable Psych involvement (Current and /or in the community):  No (Comment)  Discharge Needs  Concerns to be addressed:  Discharge Planning Concerns Readmission within the last 30 days:  No Current discharge risk:  None Barriers to Discharge:       L , LCSWA 09/15/2017, 3:03 PM  

## 2017-09-15 NOTE — Discharge Summary (Signed)
Physician Discharge Summary  WHITLEY STRYCHARZ NAT:[id number] DOB: 05-07-1966 DOA: 09/12/2017  PCP: Patient, No Pcp Per  Admit date: 09/12/2017 Discharge date: 09/15/2017  Time spent: 35 minutes  Recommendations for Outpatient Follow-up:  1. PCP in 1 week 2.  Wound Care: Recommendations: Dressing procedure/placement/frequency: Air mattress to reduce pressure.  Foam dressings to protect buttocks from moisture and shear. Heel lift boots in place to float heels. Santyl ointment to provide enzymatic debridement of nonviable tissue to left foot and knee  Discharge Diagnoses:  Principal Problem:   AKI (acute kidney injury) ([place])   UTI vs Colonization   Tobacco abuse   Quadriplegia ([place])   UTI (urinary tract infection)   Essential hypertension   Hypokalemia   HLD (hyperlipidemia)   GERD (gastroesophageal reflux disease)   Depression   Chronic diastolic CHF (congestive heart failure) (HCC)   unstageable pressure injury left foot and knee   What sure associated skin damage to sacrum  Discharge Condition: stable  Diet recommendation: low sodium heart healthy  Filed Weights   09/13/17 0500 09/14/17 0500  Weight: 90.6 kg (199 lb 11.8 oz) 94.3 kg (208 lb)    History of present illness:  hyperlipidemia, Proteus septicemia, 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 Pt stated that he had been feeling weak for about one weak, which has been gradually getting worse. In ED, pt was found to have WBC 13.8, lactic acid 1.43, 0.87, positive urinalysis with large amount of leukocytes, potassium 2.8, acute renal injury with creatinine 1.77  Hospital Course:   Acute kidney injury:  -improved with hydration, Due to dehydration, diuretics and increased metabolic demands in the setting of infection -creatinine has normalized, diuretics resumed at lower dose at discharge  Possible urinary tract  infection versus chronic colonization -Given weakness, lethargy, and the setting of abnormal UA and leukocytosis -treated with IV ceftriaxone, for 3 days, urine culture was polymicrobial hence antibiotics stopped -At this point it is still not clear whether this was colonization versus true infection -his suprapubic catheter was changed 9/30  Hypokalemia:  -repleted  Hypertension: -stable now, continue clonidine, metoprolol  Chronic diastolic heart failure:  -appears euvolemic, stopped fluids , resume Lasix tomorrow at lower dose  Dyslipidemia: Continue statin  GERD: Continue PPI  History of neurogenic bladder-status post chronic indwelling Foley catheter -suprapubic catheter changed monthly last changed on 9/30  History of spinal cord injury at C5-C7 level resulting in quadriplegia -resides at skilled nursing facility  History of autonomic dysfunction causing tachycardia and elevated blood pressure -monitor, high threshold to titrate meds  Moisture associated skin damage in sacrum: - follow-up wound RN recommendations, a overlay mattress and frequent repositioning etc.  Unstageable pressure injury to left outer foot:  -present prior to admission -continue wound care per [facility] RN recs -Left outer foot with unstageable pressure injury; 1.2X.1.2X.2cm, yellow slough in wound bed, no odor, small amt yellow drainage. Left outer knee with unstageable pressure injury; .8X.8, yellow slough in wound bed, no odor, small amt yellow drainage. -recommendations: Dressing procedure/placement/frequency: Air mattress to reduce pressure.  Foam dressings to protect buttocks from moisture and shear. Heel lift boots in place to float heels. Santyl ointment to provide enzymatic debridement of nonviable tissue to left foot and knee   Procedures:  Consultations:  [facility] RN  Discharge Exam: Vitals:   09/15/17 0510 09/15/17 0923  BP: (!) 95/54 (!) 100/59  Pulse: 89 88  Resp: 16  Temp:  98.3 F (36.8 C)   SpO2: 99%     General: AAOx3 Cardiovascular:S1S2/RRR Respiratory: CTAB  Discharge Instructions   Discharge Instructions    Diet - low sodium heart healthy    Complete by:  As directed    Increase activity slowly    Complete by:  As directed      Current Discharge Medication List    CONTINUE these medications which have CHANGED   Details  furosemide (LASIX) 20 MG tablet Take 1 tablet (20 mg total) by mouth daily.      CONTINUE these medications which have NOT CHANGED   Details  acetaminophen (TYLENOL) 325 MG tablet Take 650 mg by mouth 3 (three) times daily.    amitriptyline (ELAVIL) 25 MG tablet Take 25 mg by mouth at bedtime.    atorvastatin (LIPITOR) 40 MG tablet Take 40 mg by mouth daily.    !! baclofen (LIORESAL) 10 MG tablet Take 1 tablet (10 mg total) by mouth 3 (three) times daily. Qty: 30 each, Refills: 0    !! baclofen (LIORESAL) 20 MG tablet Take 20 mg by mouth 3 (three) times daily. Take with baclofen 10 mg to equal 30 mg    bisacodyl (DULCOLAX) 10 MG suppository Place 10 mg rectally every 8 (eight) hours as needed for moderate constipation.     cloNIDine (CATAPRES) 0.1 MG tablet Take 0.1 mg by mouth 3 (three) times daily.    ferrous sulfate 325 (65 FE) MG tablet Take 1 tablet (325 mg total) by mouth 2 (two) times daily with a meal. Refills: 3    gabapentin (NEURONTIN) 300 MG capsule Take 300 mg by mouth 3 (three) times daily.    Infant Care Products (DERMACLOUD) CREA Apply topically 3 (three) times daily. To left inner thigh    Melatonin 3 MG TABS Take 3-6 mg by mouth at bedtime as needed (for insomnia).    Menthol, Topical Analgesic, (BIOFREEZE) 4 % GEL Apply topically 3 (three) times daily. Every 10 days for pain    metoprolol tartrate (LOPRESSOR) 25 MG tablet Take 0.5 tablets (12.5 mg total) by mouth 2 (two) times daily.    Multiple Vitamin (MULTIVITAMIN WITH MINERALS) TABS tablet Take 1 tablet by mouth daily.    ondansetron  (ZOFRAN) 4 MG tablet Take 1 tablet (4 mg total) by mouth every 6 (six) hours as needed for nausea. Qty: 20 tablet, Refills: 0    opium-belladonna (B&O SUPPRETTES) 16.2-60 MG suppository Place 1 suppository rectally every 8 (eight) hours as needed for bladder spasms. Qty: 6 suppository, Refills: 0    pantoprazole (PROTONIX) 40 MG tablet Take 1 tablet (40 mg total) by mouth daily.    saccharomyces boulardii (FLORASTOR) 250 MG capsule Take 1 capsule (250 mg total) by mouth 2 (two) times daily.    solifenacin (VESICARE) 5 MG tablet Take 5 mg by mouth daily.    vitamin B-12 1000 MCG tablet Take 1 tablet (1,000 mcg total) by mouth daily.    vitamin C (VITAMIN C) 500 MG tablet Take 1 tablet (500 mg total) by mouth 2 (two) times daily.    zinc sulfate 220 MG capsule Take 1 capsule (220 mg total) by mouth daily.     !! - Potential duplicate medications found. Please discuss with provider.    STOP taking these medications     Ciprofloxacin-Ciproflox HCl (CIPRO XR) 500 MG tablet      oxybutynin (DITROPAN) 5 MG tablet      simethicone (MYLICON) 40 EH/6.3JS drops  zolpidem (AMBIEN) 5 MG tablet        No Known Allergies Follow-up Information    PCP. Schedule an appointment as soon as possible for a visit in 1 week(s).            The results of significant diagnostics from this hospitalization (including imaging, microbiology, ancillary and laboratory) are listed below for reference.    Significant Diagnostic Studies: Dg Chest Portable 1 View  Result Date: 09/12/2017 CLINICAL DATA:  Quadriplegic patient with progressing lethargy. EXAM: PORTABLE CHEST 1 VIEW COMPARISON:  02/13/2016 FINDINGS: Shallow inspiration. Heart size and pulmonary vascularity are normal. No airspace disease or consolidation in the lungs. No blunting of costophrenic angles. No pneumothorax. Mediastinal contours appear intact. Postoperative changes in the cervical spine. IMPRESSION: Shallow inspiration.  No  evidence of active pulmonary disease. Electronically Signed   By: Lucienne Capers M.D.   On: 09/12/2017 22:53    Microbiology: Recent Results (from the past 240 hour(s))  Urine culture     Status: Abnormal   Collection Time: 09/13/17 12:35 AM  Result Value Ref Range Status   Specimen Description URINE, RANDOM  Final   Special Requests NONE  Final   Culture MULTIPLE SPECIES PRESENT, SUGGEST RECOLLECTION (A)  Final   Report Status 09/14/2017 FINAL  Final  Culture, blood (Routine X 2) w Reflex to ID Panel     Status: None (Preliminary result)   Collection Time: 09/13/17  3:55 AM  Result Value Ref Range Status   Specimen Description BLOOD RIGHT ARM  Final   Special Requests   Final    BOTTLES DRAWN AEROBIC AND ANAEROBIC Blood Culture adequate volume   Culture NO GROWTH 1 DAY  Final   Report Status PENDING  Incomplete  Culture, blood (Routine X 2) w Reflex to ID Panel     Status: None (Preliminary result)   Collection Time: 09/13/17  4:12 AM  Result Value Ref Range Status   Specimen Description BLOOD RIGHT HAND  Final   Special Requests   Final    IN PEDIATRIC BOTTLE Blood Culture results may not be optimal due to an excessive volume of blood received in culture bottles   Culture NO GROWTH 1 DAY  Final   Report Status PENDING  Incomplete  MRSA PCR Screening     Status: Abnormal   Collection Time: 09/13/17  6:38 AM  Result Value Ref Range Status   MRSA by PCR POSITIVE (A) NEGATIVE Final    Comment:        The GeneXpert MRSA Assay (FDA approved for NASAL specimens only), is one component of a comprehensive MRSA colonization surveillance program. It is not intended to diagnose MRSA infection nor to guide or monitor treatment for MRSA infections. RESULT CALLED TO, READ BACK BY AND VERIFIED WITH: RN Marlou Porch [phone] MLM      Labs: Basic Metabolic Panel:  Recent Labs Lab 09/12/17 2230 09/13/17 0409 09/14/17 0344 09/15/17 0420  NA 134* 134* 137 136  K 2.8* 3.2* 3.3*  3.3*  CL 91* 92* 100* 98*  CO2 25 23 24 26   GLUCOSE 133* 121* 132* 135*  BUN 47* 45* 36* 21*  CREATININE 1.77* 1.60* 1.06 0.76  CALCIUM 9.4 9.3 9.0 8.9  MG  --  2.1  --   --    Liver Function Tests: No results for input(s): AST, ALT, ALKPHOS, BILITOT, PROT, ALBUMIN in the last 168 hours. No results for input(s): LIPASE, AMYLASE in the last 168 hours. No results for  input(s): AMMONIA in the last 168 hours. CBC:  Recent Labs Lab 09/12/17 2230 09/13/17 0409 09/14/17 0344 09/15/17 0420  WBC 13.8* 18.0* 10.9* 10.1  HGB 12.6* 13.7 12.4* 12.1*  HCT 38.8* 40.4 37.0* 36.6*  MCV 87.6 87.8 88.9 88.6  PLT 408* 417* 446* 491*   Cardiac Enzymes: No results for input(s): CKTOTAL, CKMB, CKMBINDEX, TROPONINI in the last 168 hours. BNP: BNP (last 3 results)  Recent Labs  09/13/17 0408  BNP 15.6    ProBNP (last 3 results) No results for input(s): PROBNP in the last 8760 hours.  CBG:  Recent Labs Lab 09/12/17 2225  GLUCAP 152*       SignedDomenic Polite MD.  [facility] 09/15/2017, 11:39 AM
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
## 2017-09-18 LAB — CULTURE, BLOOD (ROUTINE X 2)
CULTURE: NO GROWTH
Culture: NO GROWTH
Special Requests: ADEQUATE

## 2017-10-10 DIAGNOSIS — J9600 Acute respiratory failure, unspecified whether with hypoxia or hypercapnia: Secondary | ICD-10-CM

## 2017-10-10 HISTORY — DX: Acute respiratory failure, unspecified whether with hypoxia or hypercapnia: J96.00

## 2017-10-17 ENCOUNTER — Emergency Department (HOSPITAL_COMMUNITY): Payer: Medicaid Other

## 2017-10-17 ENCOUNTER — Encounter (HOSPITAL_COMMUNITY): Payer: Self-pay | Admitting: Emergency Medicine

## 2017-10-17 ENCOUNTER — Inpatient Hospital Stay (HOSPITAL_COMMUNITY)
Admission: EM | Admit: 2017-10-17 | Discharge: 2017-10-25 | DRG: 698 | Disposition: A | Discharge status: Skilled Nursing Facility | Payer: Medicaid Other | Attending: Internal Medicine | Admitting: Internal Medicine

## 2017-10-17 DIAGNOSIS — R4182 Altered mental status, unspecified: Secondary | ICD-10-CM | POA: Diagnosis present

## 2017-10-17 DIAGNOSIS — N179 Acute kidney failure, unspecified: Secondary | ICD-10-CM | POA: Diagnosis present

## 2017-10-17 DIAGNOSIS — I5032 Chronic diastolic (congestive) heart failure: Secondary | ICD-10-CM | POA: Diagnosis present

## 2017-10-17 DIAGNOSIS — Z79899 Other long term (current) drug therapy: Secondary | ICD-10-CM

## 2017-10-17 DIAGNOSIS — D72829 Elevated white blood cell count, unspecified: Secondary | ICD-10-CM | POA: Diagnosis not present

## 2017-10-17 DIAGNOSIS — I110 Hypertensive heart disease with heart failure: Secondary | ICD-10-CM | POA: Diagnosis present

## 2017-10-17 DIAGNOSIS — D649 Anemia, unspecified: Secondary | ICD-10-CM | POA: Diagnosis present

## 2017-10-17 DIAGNOSIS — N200 Calculus of kidney: Secondary | ICD-10-CM | POA: Diagnosis present

## 2017-10-17 DIAGNOSIS — E785 Hyperlipidemia, unspecified: Secondary | ICD-10-CM | POA: Diagnosis present

## 2017-10-17 DIAGNOSIS — Z1629 Resistance to other single specified antibiotic: Secondary | ICD-10-CM | POA: Diagnosis present

## 2017-10-17 DIAGNOSIS — M1990 Unspecified osteoarthritis, unspecified site: Secondary | ICD-10-CM | POA: Diagnosis present

## 2017-10-17 DIAGNOSIS — L89154 Pressure ulcer of sacral region, stage 4: Secondary | ICD-10-CM | POA: Diagnosis present

## 2017-10-17 DIAGNOSIS — F17210 Nicotine dependence, cigarettes, uncomplicated: Secondary | ICD-10-CM | POA: Diagnosis present

## 2017-10-17 DIAGNOSIS — Z87440 Personal history of urinary (tract) infections: Secondary | ICD-10-CM

## 2017-10-17 DIAGNOSIS — N390 Urinary tract infection, site not specified: Secondary | ICD-10-CM | POA: Diagnosis present

## 2017-10-17 DIAGNOSIS — A419 Sepsis, unspecified organism: Secondary | ICD-10-CM | POA: Diagnosis not present

## 2017-10-17 DIAGNOSIS — E876 Hypokalemia: Secondary | ICD-10-CM | POA: Diagnosis present

## 2017-10-17 DIAGNOSIS — Z981 Arthrodesis status: Secondary | ICD-10-CM | POA: Diagnosis not present

## 2017-10-17 DIAGNOSIS — G9341 Metabolic encephalopathy: Secondary | ICD-10-CM | POA: Diagnosis present

## 2017-10-17 DIAGNOSIS — T148XXS Other injury of unspecified body region, sequela: Secondary | ICD-10-CM

## 2017-10-17 DIAGNOSIS — K219 Gastro-esophageal reflux disease without esophagitis: Secondary | ICD-10-CM | POA: Diagnosis present

## 2017-10-17 DIAGNOSIS — R6521 Severe sepsis with septic shock: Secondary | ICD-10-CM | POA: Diagnosis present

## 2017-10-17 DIAGNOSIS — E119 Type 2 diabetes mellitus without complications: Secondary | ICD-10-CM | POA: Diagnosis present

## 2017-10-17 DIAGNOSIS — J9601 Acute respiratory failure with hypoxia: Secondary | ICD-10-CM

## 2017-10-17 DIAGNOSIS — G8222 Paraplegia, incomplete: Secondary | ICD-10-CM | POA: Diagnosis present

## 2017-10-17 DIAGNOSIS — A4181 Sepsis due to Enterococcus: Secondary | ICD-10-CM | POA: Diagnosis present

## 2017-10-17 DIAGNOSIS — L8990 Pressure ulcer of unspecified site, unspecified stage: Secondary | ICD-10-CM

## 2017-10-17 DIAGNOSIS — L89309 Pressure ulcer of unspecified buttock, unspecified stage: Secondary | ICD-10-CM | POA: Diagnosis not present

## 2017-10-17 DIAGNOSIS — I341 Nonrheumatic mitral (valve) prolapse: Secondary | ICD-10-CM | POA: Diagnosis not present

## 2017-10-17 DIAGNOSIS — T83510A Infection and inflammatory reaction due to cystostomy catheter, initial encounter: Secondary | ICD-10-CM | POA: Diagnosis present

## 2017-10-17 DIAGNOSIS — Z993 Dependence on wheelchair: Secondary | ICD-10-CM

## 2017-10-17 HISTORY — DX: Acute respiratory failure, unspecified whether with hypoxia or hypercapnia: J96.00

## 2017-10-17 LAB — I-STAT CHEM 8, ED
BUN: 25 mg/dL — AB (ref 6–20)
CREATININE: 2.8 mg/dL — AB (ref 0.61–1.24)
Calcium, Ion: 1.03 mmol/L — ABNORMAL LOW (ref 1.15–1.40)
Chloride: 103 mmol/L (ref 101–111)
GLUCOSE: 122 mg/dL — AB (ref 65–99)
HEMATOCRIT: 29 % — AB (ref 39.0–52.0)
Hemoglobin: 9.9 g/dL — ABNORMAL LOW (ref 13.0–17.0)
Potassium: 4.2 mmol/L (ref 3.5–5.1)
Sodium: 137 mmol/L (ref 135–145)
TCO2: 23 mmol/L (ref 22–32)

## 2017-10-17 LAB — CBC WITH DIFFERENTIAL/PLATELET
BASOS ABS: 0 10*3/uL (ref 0.0–0.1)
BASOS PCT: 0 %
EOS ABS: 0 10*3/uL (ref 0.0–0.7)
Eosinophils Relative: 0 %
HCT: 33 % — ABNORMAL LOW (ref 39.0–52.0)
HEMOGLOBIN: 10.3 g/dL — AB (ref 13.0–17.0)
LYMPHS PCT: 7 %
Lymphs Abs: 1.4 10*3/uL (ref 0.7–4.0)
MCH: 29.1 pg (ref 26.0–34.0)
MCHC: 31.2 g/dL (ref 30.0–36.0)
MCV: 93.2 fL (ref 78.0–100.0)
MONO ABS: 1.2 10*3/uL — AB (ref 0.1–1.0)
Monocytes Relative: 6 %
NEUTROS PCT: 87 %
Neutro Abs: 18.1 10*3/uL — ABNORMAL HIGH (ref 1.7–7.7)
PLATELETS: 195 10*3/uL (ref 150–400)
RBC: 3.54 MIL/uL — AB (ref 4.22–5.81)
RDW: 14.7 % (ref 11.5–15.5)
WBC: 20.7 10*3/uL — AB (ref 4.0–10.5)

## 2017-10-17 LAB — COMPREHENSIVE METABOLIC PANEL
ALK PHOS: 165 U/L — AB (ref 38–126)
ALT: 33 U/L (ref 17–63)
ANION GAP: 19 — AB (ref 5–15)
AST: 59 U/L — ABNORMAL HIGH (ref 15–41)
Albumin: 2.5 g/dL — ABNORMAL LOW (ref 3.5–5.0)
BUN: 22 mg/dL — ABNORMAL HIGH (ref 6–20)
CALCIUM: 7.9 mg/dL — AB (ref 8.9–10.3)
CO2: 17 mmol/L — AB (ref 22–32)
CREATININE: 2.97 mg/dL — AB (ref 0.61–1.24)
Chloride: 101 mmol/L (ref 101–111)
GFR, EST AFRICAN AMERICAN: 27 mL/min — AB (ref >60)
GFR, EST NON AFRICAN AMERICAN: 23 mL/min — AB (ref >60)
Glucose, Bld: 126 mg/dL — ABNORMAL HIGH (ref 65–99)
Potassium: 3.4 mmol/L — ABNORMAL LOW (ref 3.5–5.1)
SODIUM: 137 mmol/L (ref 135–145)
TOTAL PROTEIN: 6.5 g/dL (ref 6.5–8.1)
Total Bilirubin: 1.5 mg/dL — ABNORMAL HIGH (ref 0.3–1.2)

## 2017-10-17 LAB — URINALYSIS, ROUTINE W REFLEX MICROSCOPIC
BILIRUBIN URINE: NEGATIVE
GLUCOSE, UA: NEGATIVE mg/dL
KETONES UR: NEGATIVE mg/dL
NITRITE: NEGATIVE
PH: 5 (ref 5.0–8.0)
Protein, ur: 100 mg/dL — AB
SPECIFIC GRAVITY, URINE: 1.021 (ref 1.005–1.030)

## 2017-10-17 LAB — I-STAT ARTERIAL BLOOD GAS, ED
ACID-BASE DEFICIT: 4 mmol/L — AB (ref 0.0–2.0)
BICARBONATE: 20.4 mmol/L (ref 20.0–28.0)
O2 SAT: 100 %
PH ART: 7.357 (ref 7.350–7.450)
PO2 ART: 397 mmHg — AB (ref 83.0–108.0)
TCO2: 21 mmol/L — ABNORMAL LOW (ref 22–32)
pCO2 arterial: 37.4 mmHg (ref 32.0–48.0)

## 2017-10-17 LAB — I-STAT CG4 LACTIC ACID, ED: Lactic Acid, Venous: 4.76 mmol/L (ref 0.5–1.9)

## 2017-10-17 LAB — LACTIC ACID, PLASMA: Lactic Acid, Venous: 2.4 mmol/L (ref 0.5–1.9)

## 2017-10-17 LAB — CORTISOL: Cortisol, Plasma: 27.5 ug/dL

## 2017-10-17 LAB — PROCALCITONIN: Procalcitonin: >150 ng/mL

## 2017-10-17 LAB — GLUCOSE, CAPILLARY
GLUCOSE-CAPILLARY: 161 mg/dL — AB (ref 65–99)
Glucose-Capillary: 170 mg/dL — ABNORMAL HIGH (ref 65–99)

## 2017-10-17 MED ORDER — EPINEPHRINE PF 1 MG/10ML IJ SOSY
PREFILLED_SYRINGE | INTRAMUSCULAR | Status: AC | PRN
  Administered 2017-10-17: 0.1 mg via INTRAVENOUS

## 2017-10-17 MED ORDER — ONDANSETRON HCL 4 MG/2ML IJ SOLN
4.0000 mg | Freq: Four times a day (QID) | INTRAMUSCULAR | Status: DC | PRN
  Administered 2017-10-20: 4 mg via INTRAVENOUS
  Filled 2017-10-17: qty 2

## 2017-10-17 MED ORDER — MIDAZOLAM HCL 2 MG/2ML IJ SOLN
2.0000 mg | INTRAMUSCULAR | Status: AC | PRN
  Administered 2017-10-17 (×3): 2 mg via INTRAVENOUS
  Filled 2017-10-17 (×2): qty 2

## 2017-10-17 MED ORDER — ACETAMINOPHEN 650 MG RE SUPP
650.0000 mg | Freq: Once | RECTAL | Status: AC
  Administered 2017-10-17: 650 mg via RECTAL
  Filled 2017-10-17: qty 1

## 2017-10-17 MED ORDER — HEPARIN SODIUM (PORCINE) 5000 UNIT/ML IJ SOLN
5000.0000 [IU] | Freq: Three times a day (TID) | INTRAMUSCULAR | Status: DC
  Administered 2017-10-17 – 2017-10-25 (×23): 5000 [IU] via SUBCUTANEOUS
  Filled 2017-10-17 (×25): qty 1

## 2017-10-17 MED ORDER — VANCOMYCIN HCL 10 G IV SOLR
1750.0000 mg | Freq: Once | INTRAVENOUS | Status: AC
  Administered 2017-10-17: 1750 mg via INTRAVENOUS
  Filled 2017-10-17: qty 1750

## 2017-10-17 MED ORDER — VANCOMYCIN HCL IN DEXTROSE 1-5 GM/200ML-% IV SOLN: 1000.0000 mg | Freq: Once | INTRAVENOUS | Status: DC

## 2017-10-17 MED ORDER — FENTANYL CITRATE (PF) 100 MCG/2ML IJ SOLN
100.0000 ug | Freq: Once | INTRAMUSCULAR | Status: AC
  Administered 2017-10-17: 100 ug via INTRAVENOUS
  Filled 2017-10-17: qty 2

## 2017-10-17 MED ORDER — INSULIN ASPART 100 UNIT/ML ~~LOC~~ SOLN
0.0000 [IU] | Freq: Three times a day (TID) | SUBCUTANEOUS | Status: DC
  Administered 2017-10-18: 1 [IU] via SUBCUTANEOUS

## 2017-10-17 MED ORDER — ORAL CARE MOUTH RINSE
15.0000 mL | OROMUCOSAL | Status: DC
  Administered 2017-10-17 – 2017-10-18 (×5): 15 mL via OROMUCOSAL

## 2017-10-17 MED ORDER — ACETAMINOPHEN 325 MG PO TABS
650.0000 mg | ORAL_TABLET | ORAL | Status: DC | PRN
  Administered 2017-10-18 – 2017-10-22 (×7): 650 mg via ORAL
  Filled 2017-10-17 (×7): qty 2

## 2017-10-17 MED ORDER — SODIUM CHLORIDE 0.9 % IV BOLUS (SEPSIS)
1000.0000 mL | Freq: Once | INTRAVENOUS | Status: AC
  Administered 2017-10-17: 1000 mL via INTRAVENOUS

## 2017-10-17 MED ORDER — DEXTROSE 5 % IV SOLN
0.0000 ug/min | INTRAVENOUS | Status: DC
  Administered 2017-10-17: 2 ug/min via INTRAVENOUS
  Filled 2017-10-17: qty 4

## 2017-10-17 MED ORDER — MIDAZOLAM HCL 2 MG/2ML IJ SOLN
2.0000 mg | Freq: Once | INTRAMUSCULAR | Status: DC
  Filled 2017-10-17: qty 2

## 2017-10-17 MED ORDER — CHLORHEXIDINE GLUCONATE 0.12% ORAL RINSE (MEDLINE KIT)
15.0000 mL | Freq: Two times a day (BID) | OROMUCOSAL | Status: DC
  Administered 2017-10-17 – 2017-10-18 (×2): 15 mL via OROMUCOSAL

## 2017-10-17 MED ORDER — SUCCINYLCHOLINE CHLORIDE 20 MG/ML IJ SOLN
INTRAMUSCULAR | Status: AC | PRN
  Administered 2017-10-17: 100 mg via INTRAVENOUS

## 2017-10-17 MED ORDER — MIDAZOLAM HCL 2 MG/2ML IJ SOLN: 2.0000 mg | INTRAMUSCULAR | Status: DC | PRN

## 2017-10-17 MED ORDER — PANTOPRAZOLE SODIUM 40 MG IV SOLR
40.0000 mg | Freq: Every day | INTRAVENOUS | Status: DC
  Administered 2017-10-17: 40 mg via INTRAVENOUS
  Filled 2017-10-17: qty 40

## 2017-10-17 MED ORDER — VANCOMYCIN HCL 10 G IV SOLR
1250.0000 mg | INTRAVENOUS | Status: DC
  Administered 2017-10-18 – 2017-10-19 (×2): 1250 mg via INTRAVENOUS
  Filled 2017-10-17 (×3): qty 1250

## 2017-10-17 MED ORDER — LACTATED RINGERS IV BOLUS (SEPSIS): 1000.0000 mL | Freq: Once | INTRAVENOUS | Status: DC

## 2017-10-17 MED ORDER — MIDAZOLAM HCL 2 MG/2ML IJ SOLN: 1.0000 mg | INTRAMUSCULAR | Status: DC | PRN

## 2017-10-17 MED ORDER — MIDAZOLAM HCL 2 MG/2ML IJ SOLN
INTRAMUSCULAR | Status: AC | PRN
  Administered 2017-10-17: 2 mg via INTRAVENOUS

## 2017-10-17 MED ORDER — FENTANYL CITRATE (PF) 100 MCG/2ML IJ SOLN
INTRAMUSCULAR | Status: AC
  Administered 2017-10-17: 100 ug
  Filled 2017-10-17: qty 2

## 2017-10-17 MED ORDER — ETOMIDATE 2 MG/ML IV SOLN
INTRAVENOUS | Status: AC | PRN
  Administered 2017-10-17: 20 mg via INTRAVENOUS

## 2017-10-17 MED ORDER — LACTATED RINGERS IV SOLN
INTRAVENOUS | Status: DC
  Administered 2017-10-17 – 2017-10-18 (×2): 1000 mL via INTRAVENOUS
  Administered 2017-10-18 – 2017-10-24 (×11): via INTRAVENOUS

## 2017-10-17 MED ORDER — MIDAZOLAM HCL 2 MG/2ML IJ SOLN
INTRAMUSCULAR | Status: AC
  Filled 2017-10-17: qty 2

## 2017-10-17 MED ORDER — PIPERACILLIN-TAZOBACTAM 3.375 G IVPB 30 MIN
3.3750 g | Freq: Once | INTRAVENOUS | Status: AC
  Administered 2017-10-17: 3.375 g via INTRAVENOUS
  Filled 2017-10-17: qty 50

## 2017-10-17 MED ORDER — FENTANYL CITRATE (PF) 100 MCG/2ML IJ SOLN
100.0000 ug | INTRAMUSCULAR | Status: DC | PRN
  Administered 2017-10-17: 100 ug via INTRAVENOUS
  Filled 2017-10-17: qty 2

## 2017-10-17 MED ORDER — PIPERACILLIN-TAZOBACTAM 3.375 G IVPB
3.3750 g | Freq: Three times a day (TID) | INTRAVENOUS | Status: DC
  Administered 2017-10-17 – 2017-10-21 (×11): 3.375 g via INTRAVENOUS
  Filled 2017-10-17 (×17): qty 50

## 2017-10-17 MED ORDER — FENTANYL CITRATE (PF) 100 MCG/2ML IJ SOLN: 100.0000 ug | INTRAMUSCULAR | Status: DC | PRN

## 2017-10-17 MED ORDER — SODIUM CHLORIDE 0.9 % IV SOLN: 250.0000 mL | INTRAVENOUS | Status: DC | PRN

## 2017-10-17 NOTE — Progress Notes (Signed)
Patient transported on ventilator to CT and back to TRA C. Vitals stable throughout.

## 2017-10-17 NOTE — ED Notes (Signed)
Patient intubated successfully by resident on first attempt. Dr. Thomasene Lot at bedside.

## 2017-10-17 NOTE — Procedures (Signed)
Intubation Procedure Note Phillip [name] [id number] 1966-07-23  Procedure: Intubation Indications: Respiratory insufficiency  Procedure Details Consent: Unable to obtain consent because of altered level of consciousness. Time Out: Verified patient identification, verified procedure, site/side was marked, verified correct patient position, special equipment/implants available, medications/allergies/relevent history reviewed, required imaging and test results available.  Performed  Maximum sterile technique was used including cap, gloves, gown, hand hygiene and mask.  MAC and 4    Evaluation Hemodynamic Status: BP stable throughout; O2 sats: stable throughout Patient's Current Condition: stable Complications: No apparent complications Patient did tolerate procedure well. Chest X-ray ordered to verify placement.  CXR: pending.   Phillip [name] 10/17/2017

## 2017-10-17 NOTE — ED Triage Notes (Signed)
Per [facility] patient from [place] for fever x 3 days and "not total responsive". Patient only responsive to sternal rub on arrival. Patient last seen normal 10/17/17 0900-0930. Patient normally alert an oriented x4, active in wheelchair. 12 lead with ems sinus tach. Ems unable to et credible oxygen saturation. 2 L O2 applied with ems.

## 2017-10-17 NOTE — ED Notes (Signed)
EDP aware of rectal temp ?

## 2017-10-17 NOTE — Progress Notes (Signed)
Responded to page to support family of patient who was being intubated. Pt's cousin came to hospital after receiving call from patient sister. Per pt.cousin he is the one who looks after patient. EDP spoke with family and updated family pt's status. Patient seriously ill and will be admitted.  Chaplain will follow as needed.  Jaclynn Major, [place], [facility], Pager [phone]

## 2017-10-17 NOTE — ED Notes (Signed)
Titrated to 48mcg/min per Dr. Thomasene Lot

## 2017-10-17 NOTE — H&P (Signed)
PULMONARY / CRITICAL CARE MEDICINE   Name: Phillip [name] MRN: [id number] DOB: 1966-08-10    ADMISSION DATE:  10/17/2017   CHIEF COMPLAINT:  Altered mental status and fever  HISTORY OF PRESENT ILLNESS:   This is a wheelchair-bound 51 year old who lives at [facility] who has been having fevers for the past 3 days.  He has a suprapubic catheter in place and has had multiple UTIs in the past.  In addition he has decubitus ulcers.  Family is not aware of any cough or shortness of breath.  A urinalysis was submitted on 11/ 5.  That UA shows positive nitrite and leukocyte esterase.  He was transported to our emergency room due to altered mental status and hypotension.  He has received a total of 1.5 L of crystalloid and remains marginally hypotensive.  He was obtunded and has been orally intubated.  PAST MEDICAL HISTORY :  He  has a past medical history of Ankle fracture, right (06/03/2015), Broken hip ([place]), Cervical spinal cord injury ([place]), DJD (degenerative joint disease), Hip fracture requiring operative repair ([place]) (10/09/2014), Neurogenic shock due to traumatic injury (05/24/2015), Proteus septicemia ([place]), Seizures ([place]), and Tobacco abuse.  PAST SURGICAL HISTORY: He  has a past surgical history that includes Knee surgery; INSERTION OF SUPRAPUBIC CATHETER (N/A, 08/09/2016); CYSTOSCOPY (N/A, 08/09/2016); CANCELLED PROCEDURE (11/29/2015); IRRIGATION AND DEBRIDEMENT RIGHT ANKLE (Right, 11/11/2015); HARDWARE REMOVAL RIGHT ANKLE (Right, 11/11/2015); APPLICATION OF WOUND VAC (Right, 11/11/2015); IRRIGATION AND DEBRIDEMENT SACRAL DECUBITUS ULCER (N/A, 08/24/2015); PLACEMENT OF A-CELL ANd Wound  VAC (N/A, 08/24/2015); C3-C7 decompressive laminectomy with posterior cervical fusion utilizing lateral mass instrumentation and local bone grafting (N/A, 05/30/2015); OPEN REDUCTION INTERNAL FIXATION (ORIF) ANKLE FRACTURE (Right, 05/26/2015); and INTRAMEDULLARY (IM) NAIL INTERTROCHANTRIC Right knee  aspiration (Right, 10/09/2014).  No Known Allergies  No current facility-administered medications on file prior to encounter.    Current Outpatient Medications on File Prior to Encounter  Medication Sig  . acetaminophen (TYLENOL) 325 MG tablet Take 650 mg by mouth 3 (three) times daily.  Marland Kitchen amitriptyline (ELAVIL) 25 MG tablet Take 25 mg by mouth at bedtime.  Marland Kitchen atorvastatin (LIPITOR) 40 MG tablet Take 40 mg by mouth daily.  . baclofen (LIORESAL) 10 MG tablet Take 1 tablet (10 mg total) by mouth 3 (three) times daily. (Patient taking differently: Take 10 mg by mouth 3 (three) times daily. Take with baclofen 20 mg to equal 30 mg)  . baclofen (LIORESAL) 20 MG tablet Take 20 mg by mouth 3 (three) times daily. Take with baclofen 10 mg to equal 30 mg  . bisacodyl (DULCOLAX) 10 MG suppository Place 10 mg rectally every 8 (eight) hours as needed for moderate constipation.   . cloNIDine (CATAPRES) 0.1 MG tablet Take 0.1 mg by mouth 3 (three) times daily.  . ferrous sulfate 325 (65 FE) MG tablet Take 1 tablet (325 mg total) by mouth 2 (two) times daily with a meal.  . furosemide (LASIX) 20 MG tablet Take 1 tablet (20 mg total) by mouth daily.  Marland Kitchen gabapentin (NEURONTIN) 300 MG capsule Take 300 mg by mouth 3 (three) times daily.  . Infant Care Products (DERMACLOUD) CREA Apply topically 3 (three) times daily. To left inner thigh  . Melatonin 3 MG TABS Take 3-6 mg by mouth at bedtime as needed (for insomnia).  . Menthol, Topical Analgesic, (BIOFREEZE) 4 % GEL Apply topically 3 (three) times daily. Every 10 days for pain  . metoprolol tartrate (LOPRESSOR) 25 MG tablet Take 0.5 tablets (12.5 mg total)  by mouth 2 (two) times daily.  . Multiple Vitamin (MULTIVITAMIN WITH MINERALS) TABS tablet Take 1 tablet by mouth daily.  . ondansetron (ZOFRAN) 4 MG tablet Take 1 tablet (4 mg total) by mouth every 6 (six) hours as needed for nausea.  Marland Kitchen opium-belladonna (B&O SUPPRETTES) 16.2-60 MG suppository Place 1 suppository  rectally every 8 (eight) hours as needed for bladder spasms.  . pantoprazole (PROTONIX) 40 MG tablet Take 1 tablet (40 mg total) by mouth daily.  Marland Kitchen saccharomyces boulardii (FLORASTOR) 250 MG capsule Take 1 capsule (250 mg total) by mouth 2 (two) times daily.  . solifenacin (VESICARE) 5 MG tablet Take 5 mg by mouth daily.  . vitamin B-12 1000 MCG tablet Take 1 tablet (1,000 mcg total) by mouth daily.  . vitamin C (VITAMIN C) 500 MG tablet Take 1 tablet (500 mg total) by mouth 2 (two) times daily.  Marland Kitchen zinc sulfate 220 MG capsule Take 1 capsule (220 mg total) by mouth daily. (Patient taking differently: Take 220 mg by mouth at bedtime. )    FAMILY HISTORY:  His indicated that his mother is deceased. He indicated that his father is deceased. He indicated that his sister is alive.   SOCIAL HISTORY: He  reports that he has been smoking cigarettes.  He has a 0.05 pack-year smoking history. he has never used smokeless tobacco. He reports that he does not drink alcohol or use drugs.  REVIEW OF SYSTEMS:   Not obtainable  SUBJECTIVE:  As above  VITAL SIGNS: BP 115/60   Pulse (!) 126   Temp (!) 103.6 F (39.8 C) (Rectal)   Resp 18   Ht 5\' 9"  (1.753 m)   Wt 207 lb 14.3 oz (94.3 kg)   SpO2 100%   BMI 30.70 kg/m   HEMODYNAMICS:    VENTILATOR SETTINGS: Vent Mode: PRVC FiO2 (%):  [100 %] 100 % Set Rate:  [18 bmp] 18 bmp Vt Set:  [570 mL] 570 mL PEEP:  [5 cmH20] 5 cmH20 Plateau Pressure:  [16 cmH20] 16 cmH20  INTAKE / OUTPUT: No intake/output data recorded.  PHYSICAL EXAMINATION: General: This is a middle-aged male who is orally intubated and mechanically ventilated.  He is not responding to voice. Neuro: There is no response to voice.  Does not eye open to noxious stimuli.  Pupils are equal.  Does move the upper extremities to sternal rub.  The neck is supple. Cardiovascular: S1 and S2 are regular without murmur rub or gallop. Lungs: Respirations are unlabored, there is symmetric  air movement, there are no wheezes and no rhonchi. Abdomen: Abdomen is flat and soft without any organomegaly masses tenderness guarding or rebound.  There is a suprapubic catheter in place which does not have any gross purulence at the insertion site.  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 LABS:  BMET Recent Labs  Lab 10/17/17 1525 10/17/17 1549  NA 137 137  K 3.4* 4.2  CL 101 103  CO2 17*  --   BUN 22* 25*  CREATININE 2.97* 2.80*  GLUCOSE 126* 122*    Electrolytes Recent Labs  Lab 10/17/17 1525  CALCIUM 7.9*    CBC Recent Labs  Lab 10/17/17 1525 10/17/17 1549  WBC 20.7*  --   HGB 10.3* 9.9*  HCT 33.0* 29.0*  PLT 195  --     Coag's No results for input(s): APTT, INR in the last 168 hours.  Sepsis Markers Recent Labs  Lab 10/17/17 1550  LATICACIDVEN 4.76*    ABG Recent Labs  Lab 10/17/17 1650  PHART 7.357  PCO2ART 37.4  PO2ART 397.0*    Liver Enzymes Recent Labs  Lab 10/17/17 1525  AST 59*  ALT 33  ALKPHOS 165*  BILITOT 1.5*  ALBUMIN 2.5*    Cardiac Enzymes No results for input(s): TROPONINI, PROBNP in the last 168 hours.  Glucose No results for input(s): GLUCAP in the last 168 hours.  Imaging Ct Head Wo Contrast  Result Date: 10/17/2017 CLINICAL DATA:  Altered mental status with fever x3 days. EXAM: CT HEAD WITHOUT CONTRAST TECHNIQUE: Contiguous axial images were obtained from the base of the skull through the vertex without intravenous contrast. COMPARISON:  05/31/2016 FINDINGS: Brain: No evidence of acute infarction, hemorrhage, hydrocephalus, extra-axial collection or mass lesion/mass effect. Vascular: No hyperdense vessel or unexpected calcification. Skull: Normal. Negative for fracture or focal lesion. Sinuses/Orbits: No acute finding. Other: None IMPRESSION: No acute intracranial abnormality. Electronically Signed   By: Ashley Royalty M.D.   On: 10/17/2017 17:45   Dg Chest Portable 1  View  Result Date: 10/17/2017 CLINICAL DATA:  ETT and orogastric tube placement EXAM: PORTABLE CHEST 1 VIEW COMPARISON:  09/12/2017 FINDINGS: The tip of an endotracheal tube is seen approximately 2.5 cm above the carina. A gastric tube with side port is noted in the left upper quadrant of the abdomen, along the expected location the stomach. Heart is top-normal in size. Low volumes are slightly low without pneumonic consolidation, effusion or pneumothorax. Spinal fusion hardware is partially included at the base of the cervical spine. No acute osseous appearing abnormality. IMPRESSION: 1. Endotracheal tube tip approximately 2.5 cm above the carina with gastric tube and side-port noted in the expected location of the stomach. 2. Shallow inspiration without pneumonic consolidation or pneumothorax. No effusion or CHF. Electronically Signed   By: Ashley Royalty M.D.   On: 10/17/2017 15:32     STUDIES:  Chest x-ray to my eye does not show any infiltrate   ANTIBIOTICS: Vancomycin and Zosyn were initiated on 11/8  SIGNIFICANT EVENTS: Intubated 11/8    DISCUSSION: This is a 51 year old quadriplegic who presents with septic shock manifested as hypotension and acute kidney injury.  Standard resuscitation protocol of fluids followed by pressors as needed is being pursued.  Likely source is his urinary tract although he has some decubitus ulcers as well.   ASSESSMENT / PLAN:  PULMONARY A: He is intubated solely for airway protection.  CARDIOVASCULAR A: Hypotension secondary to septic shock.  Resuscitation is in progress and levo fed will be used as needed.  A random serum cortisol has been ordered.  RENAL A: He has an acute kidney injury his baseline creatinine is 0.76 and today it is 2.8.  ENDOCRINE A: Family reported a history of diabetes I see no hypoglycemic agents on the MAR from the nursing home however I have ordered sliding scale insulin.  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  Rater than 32 minutes was spent in the care of this unstable patient today   Lars Masson, MD  [place] Pager: [phone]  10/17/2017, 5:56 PM

## 2017-10-17 NOTE — Progress Notes (Signed)
RT note-Patient was transferred to 2m06, remains on current ventilator settings.
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
## 2017-10-17 NOTE — Progress Notes (Signed)
Pharmacy Antibiotic Note  Phillip [name] is a quadripelic 51 y.o. male admitted on 10/17/2017 with sepsis.  Pharmacy has been consulted for Vanc/Zosyn dosing.  Last weight 09/14/17 94kg, Scr 2.8, CrCl 36.6 ml/min, LA 4.76, Pulse Rate 123, BP 67/45  With cool extremities upon ED arrival   Plan: Vancomycin 1750mg  IV X1 THEN Vanc 1250mg  IV Q24H  Goal trough 15-20 mcg/mL. Zosyn 3.375g 30 minute infusion X1 then 3.375g Q8H (4 hr infusion)  F/U renal function, clinical status, C&S, vanc trough at [facility]   Recent Labs  Lab 10/17/17 1549 10/17/17 1550  CREATININE 2.80*  --   LATICACIDVEN  --  4.76*    Estimated Creatinine Clearance: 36.6 mL/min (A) (by C-G formula based on SCr of 2.8 mg/dL (H)).    No Known Allergies  Antimicrobials this admission: Vanc 11/8 >> Zosyn 11/8 >>  Thank you for allowing pharmacy to be a part of this patient's care.  Felicity Pellegrini  PharmD Candidate '19 10/14/2017 3:30 PM

## 2017-10-18 ENCOUNTER — Inpatient Hospital Stay (HOSPITAL_COMMUNITY): Payer: Medicaid Other

## 2017-10-18 DIAGNOSIS — J9601 Acute respiratory failure with hypoxia: Secondary | ICD-10-CM

## 2017-10-18 DIAGNOSIS — I341 Nonrheumatic mitral (valve) prolapse: Secondary | ICD-10-CM

## 2017-10-18 DIAGNOSIS — L8990 Pressure ulcer of unspecified site, unspecified stage: Secondary | ICD-10-CM

## 2017-10-18 LAB — GLUCOSE, CAPILLARY
GLUCOSE-CAPILLARY: 110 mg/dL — AB (ref 65–99)
GLUCOSE-CAPILLARY: 111 mg/dL — AB (ref 65–99)
GLUCOSE-CAPILLARY: 111 mg/dL — AB (ref 65–99)
GLUCOSE-CAPILLARY: 118 mg/dL — AB (ref 65–99)
GLUCOSE-CAPILLARY: 135 mg/dL — AB (ref 65–99)
Glucose-Capillary: 111 mg/dL — ABNORMAL HIGH (ref 65–99)

## 2017-10-18 LAB — CBC WITH DIFFERENTIAL/PLATELET
Basophils Absolute: 0 10*3/uL (ref 0.0–0.1)
Basophils Relative: 0 %
Eosinophils Absolute: 0 10*3/uL (ref 0.0–0.7)
Eosinophils Relative: 0 %
HCT: 26.3 % — ABNORMAL LOW (ref 39.0–52.0)
HEMOGLOBIN: 8.6 g/dL — AB (ref 13.0–17.0)
LYMPHS ABS: 2.4 10*3/uL (ref 0.7–4.0)
LYMPHS PCT: 14 %
MCH: 29.2 pg (ref 26.0–34.0)
MCHC: 32.7 g/dL (ref 30.0–36.0)
MCV: 89.2 fL (ref 78.0–100.0)
Monocytes Absolute: 1.1 10*3/uL — ABNORMAL HIGH (ref 0.1–1.0)
Monocytes Relative: 7 %
NEUTROS PCT: 79 %
Neutro Abs: 12.8 10*3/uL — ABNORMAL HIGH (ref 1.7–7.7)
Platelets: 183 10*3/uL (ref 150–400)
RBC: 2.95 MIL/uL — AB (ref 4.22–5.81)
RDW: 14.9 % (ref 11.5–15.5)
WBC: 16.3 10*3/uL — AB (ref 4.0–10.5)

## 2017-10-18 LAB — BLOOD GAS, ARTERIAL
Acid-base deficit: 1.6 mmol/L (ref 0.0–2.0)
Bicarbonate: 20.7 mmol/L (ref 20.0–28.0)
DRAWN BY: 24513
FIO2: 30
O2 SAT: 98.7 %
PATIENT TEMPERATURE: 98.6
PEEP: 5 cmH2O
PH ART: 7.536 — AB (ref 7.350–7.450)
RATE: 18 resp/min
VT: 570 mL
pCO2 arterial: 24.6 mmHg — ABNORMAL LOW (ref 32.0–48.0)
pO2, Arterial: 124 mmHg — ABNORMAL HIGH (ref 83.0–108.0)

## 2017-10-18 LAB — ECHOCARDIOGRAM COMPLETE
CHL CUP DOP CALC LVOT VTI: 16.5 cm
CHL CUP TV REG PEAK VELOCITY: 308 cm/s
FS: 29 % (ref 28–44)
HEIGHTINCHES: 69 in
IV/PV OW: 0.86
LA vol A4C: 49 ml
LA vol: 52 mL
LADIAMINDEX: 1.73 cm/m2
LASIZE: 36 mm
LAVOLIN: 25 mL/m2
LDCA: 3.14 cm2
LEFT ATRIUM END SYS DIAM: 36 mm
LV PW d: 10.2 mm — AB (ref 0.6–1.1)
LV TDI E'MEDIAL: 10.8
LVELAT: 14.1 cm/s
LVOT SV: 52 mL
LVOT diameter: 20 mm
LVOT peak vel: 106 cm/s
Lateral S' vel: 12 cm/s
RV sys press: 41 mmHg
TAPSE: 21.9 mm
TDI e' lateral: 14.1
TR max vel: 308 cm/s
WEIGHTICAEL: 3255.75 [oz_av]

## 2017-10-18 LAB — RENAL FUNCTION PANEL
Albumin: 2.1 g/dL — ABNORMAL LOW (ref 3.5–5.0)
Albumin: 2.3 g/dL — ABNORMAL LOW (ref 3.5–5.0)
Anion gap: 11 (ref 5–15)
Anion gap: 12 (ref 5–15)
BUN: 22 mg/dL — ABNORMAL HIGH (ref 6–20)
BUN: 16 mg/dL (ref 6–20)
CALCIUM: 8.2 mg/dL — AB (ref 8.9–10.3)
CHLORIDE: 105 mmol/L (ref 101–111)
CO2: 20 mmol/L — AB (ref 22–32)
CO2: 20 mmol/L — ABNORMAL LOW (ref 22–32)
Calcium: 7.8 mg/dL — ABNORMAL LOW (ref 8.9–10.3)
Chloride: 106 mmol/L (ref 101–111)
Creatinine, Ser: 2.09 mg/dL — ABNORMAL HIGH (ref 0.61–1.24)
Creatinine, Ser: 1.73 mg/dL — ABNORMAL HIGH (ref 0.61–1.24)
GFR calc Af Amer: 52 mL/min — ABNORMAL LOW (ref >60)
GFR calc non Af Amer: 36 mL/min — ABNORMAL LOW (ref >60)
GFR calc non Af Amer: 45 mL/min — ABNORMAL LOW (ref >60)
GFR, EST AFRICAN AMERICAN: 41 mL/min — AB (ref >60)
GLUCOSE: 103 mg/dL — AB (ref 65–99)
Glucose, Bld: 107 mg/dL — ABNORMAL HIGH (ref 65–99)
PHOSPHORUS: 2.2 mg/dL — AB (ref 2.5–4.6)
POTASSIUM: 3.3 mmol/L — AB (ref 3.5–5.1)
Phosphorus: 1.6 mg/dL — ABNORMAL LOW (ref 2.5–4.6)
Potassium: 3.5 mmol/L (ref 3.5–5.1)
SODIUM: 136 mmol/L (ref 135–145)
SODIUM: 138 mmol/L (ref 135–145)

## 2017-10-18 LAB — RAPID URINE DRUG SCREEN, HOSP PERFORMED
Amphetamines: NOT DETECTED
BARBITURATES: NOT DETECTED
Benzodiazepines: POSITIVE — AB
Cocaine: NOT DETECTED
Opiates: NOT DETECTED
Tetrahydrocannabinol: POSITIVE — AB

## 2017-10-18 LAB — LACTIC ACID, PLASMA: Lactic Acid, Venous: 1.6 mmol/L (ref 0.5–1.9)

## 2017-10-18 LAB — MRSA PCR SCREENING: MRSA by PCR: INVALID — AB

## 2017-10-18 LAB — PROCALCITONIN

## 2017-10-18 LAB — MAGNESIUM: Magnesium: 1.6 mg/dL — ABNORMAL LOW (ref 1.7–2.4)

## 2017-10-18 MED ORDER — COLLAGENASE 250 UNIT/GM EX OINT
TOPICAL_OINTMENT | Freq: Every day | CUTANEOUS | Status: DC
  Administered 2017-10-18 – 2017-10-25 (×8): via TOPICAL
  Filled 2017-10-18: qty 30

## 2017-10-18 MED ORDER — MAGNESIUM SULFATE 2 GM/50ML IV SOLN
2.0000 g | Freq: Once | INTRAVENOUS | Status: AC
  Administered 2017-10-18: 2 g via INTRAVENOUS
  Filled 2017-10-18: qty 50

## 2017-10-18 MED ORDER — MUPIROCIN 2 % EX OINT
1.0000 "application " | TOPICAL_OINTMENT | Freq: Two times a day (BID) | CUTANEOUS | Status: DC
  Administered 2017-10-18 – 2017-10-20 (×6): 1 via NASAL
  Filled 2017-10-18 (×3): qty 22

## 2017-10-18 MED ORDER — ATORVASTATIN CALCIUM 40 MG PO TABS
40.0000 mg | ORAL_TABLET | Freq: Every day | ORAL | Status: DC
  Administered 2017-10-18 – 2017-10-24 (×7): 40 mg via ORAL
  Filled 2017-10-18 (×8): qty 1

## 2017-10-18 MED ORDER — POTASSIUM CHLORIDE 20 MEQ/15ML (10%) PO SOLN
40.0000 meq | Freq: Once | ORAL | Status: AC
  Administered 2017-10-18: 40 meq
  Filled 2017-10-18: qty 30

## 2017-10-18 MED ORDER — GABAPENTIN 100 MG PO CAPS
100.0000 mg | ORAL_CAPSULE | Freq: Three times a day (TID) | ORAL | Status: DC
  Administered 2017-10-18 – 2017-10-25 (×21): 100 mg via ORAL
  Filled 2017-10-18 (×22): qty 1

## 2017-10-18 MED ORDER — LACTATED RINGERS IV BOLUS (SEPSIS)
1000.0000 mL | Freq: Once | INTRAVENOUS | Status: AC
  Administered 2017-10-18: 1000 mL via INTRAVENOUS

## 2017-10-18 MED ORDER — CHLORHEXIDINE GLUCONATE 0.12% ORAL RINSE (MEDLINE KIT): 15.0000 mL | Freq: Two times a day (BID) | OROMUCOSAL | Status: DC

## 2017-10-18 MED ORDER — BACLOFEN 10 MG PO TABS
5.0000 mg | ORAL_TABLET | Freq: Three times a day (TID) | ORAL | Status: DC
  Administered 2017-10-18 – 2017-10-25 (×21): 5 mg via ORAL
  Filled 2017-10-18 (×22): qty 1

## 2017-10-18 MED ORDER — POTASSIUM PHOSPHATES 15 MMOLE/5ML IV SOLN
30.0000 mmol | Freq: Once | INTRAVENOUS | Status: AC
  Administered 2017-10-18: 30 mmol via INTRAVENOUS
  Filled 2017-10-18: qty 10

## 2017-10-18 MED ORDER — SODIUM CHLORIDE 0.9 % IV BOLUS (SEPSIS)
500.0000 mL | Freq: Once | INTRAVENOUS | Status: AC
  Administered 2017-10-18: 500 mL via INTRAVENOUS

## 2017-10-18 MED ORDER — ORAL CARE MOUTH RINSE
15.0000 mL | Freq: Two times a day (BID) | OROMUCOSAL | Status: DC
  Administered 2017-10-18 – 2017-10-25 (×10): 15 mL via OROMUCOSAL

## 2017-10-18 MED ORDER — CHLORHEXIDINE GLUCONATE CLOTH 2 % EX PADS
6.0000 | MEDICATED_PAD | Freq: Every day | CUTANEOUS | Status: DC
  Administered 2017-10-18 – 2017-10-20 (×2): 6 via TOPICAL

## 2017-10-18 MED ORDER — PANTOPRAZOLE SODIUM 40 MG PO TBEC
40.0000 mg | DELAYED_RELEASE_TABLET | Freq: Every day | ORAL | Status: DC
  Administered 2017-10-18 – 2017-10-25 (×8): 40 mg via ORAL
  Filled 2017-10-18 (×8): qty 1

## 2017-10-18 MED ORDER — ORAL CARE MOUTH RINSE
15.0000 mL | Freq: Four times a day (QID) | OROMUCOSAL | Status: DC
  Administered 2017-10-18: 15 mL via OROMUCOSAL

## 2017-10-18 MED ORDER — ORAL CARE MOUTH RINSE: 15.0000 mL | Freq: Two times a day (BID) | OROMUCOSAL | Status: DC

## 2017-10-18 MED ORDER — NICOTINE 7 MG/24HR TD PT24
7.0000 mg | MEDICATED_PATCH | Freq: Every day | TRANSDERMAL | Status: DC
  Filled 2017-10-18 (×6): qty 1

## 2017-10-18 NOTE — Progress Notes (Signed)
Notified by lab that  the MRSA swab screen submitted at 1900 hrs was inconclusive  X 2 and therefore specimen will be cultured.  Pt had been placed on Contact precautions for MRSA upon arrival to unit due to history of MRSA.  CHG wiped down on arrival to unit and this AM.  MRSA positive orders in place.

## 2017-10-18 NOTE — Progress Notes (Signed)
Suprapubic catheter was exchange as per MD. Order.After explained the pt. The procedure,the old catheter was removed and the new catheter was inserted.Pt. Tolerated it well.

## 2017-10-18 NOTE — Progress Notes (Signed)
  Echocardiogram 2D Echocardiogram has been performed.  Johny Chess 10/18/2017, 2:21 PM

## 2017-10-18 NOTE — Progress Notes (Signed)
[facility] ADULT ICU REPLACEMENT PROTOCOL FOR AM LAB REPLACEMENT ONLY  The patient does not apply for the [facility] Adult ICU Electrolyte Replacment Protocol based on the criteria listed below:    Is urine output >/= 0.5 ml/kg/hr for the last 6 hours? No. Patient's UOP is 0.3 ml/kg/hr  Abnormal electrolyte(s): K3.3,Mg1.6,Phos1.6   If a panic level lab has been reported, has the CCM MD in charge been notified? Yes.  .   Physician:  Patricia Nettle, MD  Vear Clock 10/18/2017 6:07 AM

## 2017-10-18 NOTE — Progress Notes (Signed)
PULMONARY / CRITICAL CARE MEDICINE   Name: Phillip [name] MRN: [id number] DOB: [birth year]    ADMISSION DATE:  10/17/2017 CONSULTATION DATE:  10/17/2017  REFERRING MD:  Macarthur Critchley, MD    Phillip COMPLAINT:  Altered mental status and fever    HISTORY OF PRESENT ILLNESS:   This is a wheelchair-bound 51 year old who lives at [facility] who has been having fevers for the past 3 days.  He has a suprapubic catheter in place and has had multiple UTIs in the past.  In addition he has decubitus ulcers.  Family is not aware of any cough or shortness of breath.  A urinalysis was submitted on 11/5.  That UA shows positive nitrite and leukocyte esterase.  He was transported to our emergency room due to altered mental status and hypotension.  He has received a total of 1.5 L of crystalloid and remains marginally hypotensive.  He was obtunded and has been orally intubated.    PAST MEDICAL HISTORY :  He  has a past medical history of Ankle fracture, right (06/03/2015), Broken hip ([place]), Cervical spinal cord injury ([place]), DJD (degenerative joint disease), Hip fracture requiring operative repair ([place]) (10/09/2014), Neurogenic shock due to traumatic injury (05/24/2015), Proteus septicemia ([place]), Seizures ([place]), and Tobacco abuse.  PAST SURGICAL HISTORY: He  has a past surgical history that includes Knee surgery; INSERTION OF SUPRAPUBIC CATHETER (N/A, 08/09/2016); CYSTOSCOPY (N/A, 08/09/2016); CANCELLED PROCEDURE (11/29/2015); IRRIGATION AND DEBRIDEMENT RIGHT ANKLE (Right, 11/11/2015); HARDWARE REMOVAL RIGHT ANKLE (Right, 11/11/2015); APPLICATION OF WOUND VAC (Right, 11/11/2015); IRRIGATION AND DEBRIDEMENT SACRAL DECUBITUS ULCER (N/A, 08/24/2015); PLACEMENT OF A-CELL ANd Wound  VAC (N/A, 08/24/2015); C3-C7 decompressive laminectomy with posterior cervical fusion utilizing lateral mass instrumentation and local bone grafting (N/A, 05/30/2015); OPEN REDUCTION INTERNAL FIXATION (ORIF) ANKLE FRACTURE  (Right, 05/26/2015); and INTRAMEDULLARY (IM) NAIL INTERTROCHANTRIC Right knee aspiration (Right, 10/09/2014).  No Known Allergies  No current facility-administered medications on file prior to encounter.    Current Outpatient Medications on File Prior to Encounter  Medication Sig  . acetaminophen (TYLENOL) 325 MG tablet Take 650 mg by mouth 3 (three) times daily.  Marland Kitchen amitriptyline (ELAVIL) 25 MG tablet Take 25 mg by mouth at bedtime.  Marland Kitchen atorvastatin (LIPITOR) 40 MG tablet Take 40 mg by mouth daily.  . baclofen (LIORESAL) 10 MG tablet Take 1 tablet (10 mg total) by mouth 3 (three) times daily. (Patient taking differently: Take 10 mg 3 (three) times daily by mouth. Take with 20mg  tablet to equal 30mg  total)  . baclofen (LIORESAL) 20 MG tablet Take 20 mg by mouth 3 (three) times daily. Take with baclofen 10 mg to equal 30 mg  . baclofen (LIORESAL) 20 MG tablet Take 20 mg 3 (three) times daily by mouth. Take with 10mg  tablet to equal 30mg  total  . bisacodyl (DULCOLAX) 10 MG suppository Place 10 mg rectally every 8 (eight) hours as needed for moderate constipation.   . cloNIDine (CATAPRES) 0.1 MG tablet Take 0.1 mg by mouth 3 (three) times daily.  . collagenase (SANTYL) ointment Apply 1 application every evening topically. Apply to left lateral foot  . doxycycline (DORYX) 100 MG EC tablet Take 100 mg 2 (two) times daily by mouth. For 10 days  . ferrous sulfate 325 (65 FE) MG tablet Take 1 tablet (325 mg total) by mouth 2 (two) times daily with a meal.  . furosemide (LASIX) 20 MG tablet Take 1 tablet (20 mg total) by mouth daily. (Patient taking differently: Take 40 mg daily by mouth. )  .  gabapentin (NEURONTIN) 300 MG capsule Take 300 mg by mouth 3 (three) times daily.  . Melatonin 3 MG TABS Take 3-6 mg by mouth at bedtime as needed (for insomnia).  . metoprolol tartrate (LOPRESSOR) 25 MG tablet Take 0.5 tablets (12.5 mg total) by mouth 2 (two) times daily.  . Multiple Vitamin (MULTIVITAMIN WITH  MINERALS) TABS tablet Take 1 tablet by mouth daily.  . ondansetron (ZOFRAN) 4 MG tablet Take 1 tablet (4 mg total) by mouth every 6 (six) hours as needed for nausea.  Marland Kitchen opium-belladonna (B&O SUPPRETTES) 16.2-60 MG suppository Place 1 suppository rectally every 8 (eight) hours as needed for bladder spasms.  . pantoprazole (PROTONIX) 40 MG tablet Take 1 tablet (40 mg total) by mouth daily.  . potassium chloride (K-DUR,KLOR-CON) 10 MEQ tablet Take 10 mEq daily by mouth.  . saccharomyces boulardii (FLORASTOR) 250 MG capsule Take 1 capsule (250 mg total) by mouth 2 (two) times daily.  . solifenacin (VESICARE) 5 MG tablet Take 5 mg by mouth daily.  . vitamin B-12 1000 MCG tablet Take 1 tablet (1,000 mcg total) by mouth daily.  . vitamin C (VITAMIN C) 500 MG tablet Take 1 tablet (500 mg total) by mouth 2 (two) times daily.  Marland Kitchen zinc sulfate 220 MG capsule Take 1 capsule (220 mg total) by mouth daily. (Patient taking differently: Take 220 mg by mouth at bedtime. )    FAMILY HISTORY:  His indicated that his mother is deceased. He indicated that his father is deceased. He indicated that his sister is alive.   SOCIAL HISTORY: He  reports that he has been smoking cigarettes.  He has a 0.05 pack-year smoking history. he has never used smokeless tobacco. He reports that he does not drink alcohol or use drugs.  REVIEW OF SYSTEMS:   Intubated, not attainable.  SUBJECTIVE:  Afebrile since starting broad spectrum antibiotics. BP stable off pressors. Tolerating full vent support.   VITAL SIGNS: BP 107/75 (BP Location: Left Arm)   Pulse (!) 109   Temp 98 F (36.7 C) (Oral)   Resp (!) 21   Ht 5\' 9"  (1.753 m)   Wt 203 lb 7.8 oz (92.3 kg)   SpO2 100%   BMI 30.05 kg/m   HEMODYNAMICS:    VENTILATOR SETTINGS: Vent Mode: PRVC FiO2 (%):  [30 %-100 %] 30 % Set Rate:  [18 bmp] 18 bmp Vt Set:  [570 mL] 570 mL PEEP:  [5 cmH20] 5 cmH20 Plateau Pressure:  [15 cmH20-16 cmH20] 15 cmH20  INTAKE /  OUTPUT: I/O last 3 completed shifts: In: 3600 [I.V.:1360; NG/GT:90; IV Piggyback:2150] Out: 1150 [Urine:1150]  UOP 1.15 L last 24 hours  PHYSICAL EXAMINATION: General: chronically ill-appearing male in bed, NAD with non-toxic appearance HEENT: normocephalic, atraumatic, moist mucous membranes Neck: supple, non-tender without lymphadenopathy Cardiovascular: regular rate and rhythm without murmurs, rubs, or gallops Lungs: clear to auscultation bilaterally with normal work of breathing on vent Abdomen: soft, non-tender, non-distended, normoactive bowel sounds GU: suprapubic catheter intact without erythema or hematuria Skin: warm, dry, no rashes or lesions, cap refill < 2 seconds Extremities: warm and well perfused, no edema Neuro: absent motor function on lower extremities bilaterally with preservation of upper extremities  LABS:  BMET Recent Labs  Lab 10/17/17 1525 10/17/17 1549 10/18/17 0240  NA 137 137 136  K 3.4* 4.2 3.3*  CL 101 103 105  CO2 17*  --  20*  BUN 22* 25* 22*  CREATININE 2.97* 2.80* 2.09*  GLUCOSE 126* 122* 107*  Electrolytes Recent Labs  Lab 10/17/17 1525 10/18/17 0240  CALCIUM 7.9* 7.8*  MG  --  1.6*  PHOS  --  1.6*    CBC Recent Labs  Lab 10/17/17 1525 10/17/17 1549 10/18/17 0240  WBC 20.7*  --  16.3*  HGB 10.3* 9.9* 8.6*  HCT 33.0* 29.0* 26.3*  PLT 195  --  183    Coag's No results for input(s): APTT, INR in the last 168 hours.  Sepsis Markers Recent Labs  Lab 10/17/17 1550 10/17/17 2023 10/17/17 2326 10/18/17 0240  LATICACIDVEN 4.76* 2.4* 1.6  --   PROCALCITON  --  >150.00  --  >150.00    ABG Recent Labs  Lab 10/17/17 1650 10/18/17 0317  PHART 7.357 7.536*  PCO2ART 37.4 24.6*  PO2ART 397.0* 124*    Liver Enzymes Recent Labs  Lab 10/17/17 1525 10/18/17 0240  AST 59*  --   ALT 33  --   ALKPHOS 165*  --   BILITOT 1.5*  --   ALBUMIN 2.5* 2.1*    Cardiac Enzymes No results for input(s): TROPONINI, PROBNP in  the last 168 hours.  Glucose Recent Labs  Lab 10/17/17 1955 10/17/17 2349 10/18/17 0329  GLUCAP 170* 161* 111*    Imaging Ct Head Wo Contrast  Result Date: 10/17/2017 CLINICAL DATA:  Altered mental status with fever x3 days. EXAM: CT HEAD WITHOUT CONTRAST TECHNIQUE: Contiguous axial images were obtained from the base of the skull through the vertex without intravenous contrast. COMPARISON:  05/31/2016 FINDINGS: Brain: No evidence of acute infarction, hemorrhage, hydrocephalus, extra-axial collection or mass lesion/mass effect. Vascular: No hyperdense vessel or unexpected calcification. Skull: Normal. Negative for fracture or focal lesion. Sinuses/Orbits: No acute finding. Other: None IMPRESSION: No acute intracranial abnormality. Electronically Signed   By: Ashley Royalty M.D.   On: 10/17/2017 17:45   Dg Chest Portable 1 View  Result Date: 10/17/2017 CLINICAL DATA:  ETT and orogastric tube placement EXAM: PORTABLE CHEST 1 VIEW COMPARISON:  09/12/2017 FINDINGS: The tip of an endotracheal tube is seen approximately 2.5 cm above the carina. A gastric tube with side port is noted in the left upper quadrant of the abdomen, along the expected location the stomach. Heart is top-normal in size. Low volumes are slightly low without pneumonic consolidation, effusion or pneumothorax. Spinal fusion hardware is partially included at the base of the cervical spine. No acute osseous appearing abnormality. IMPRESSION: 1. Endotracheal tube tip approximately 2.5 cm above the carina with gastric tube and side-port noted in the expected location of the stomach. 2. Shallow inspiration without pneumonic consolidation or pneumothorax. No effusion or CHF. Electronically Signed   By: Ashley Royalty M.D.   On: 10/17/2017 15:32     STUDIES:  CXR 11/8 >> ETT and GT in proper place, shallow inspiration w/o consolidation of pneumothorax, no effusion or CHF CT head WO 11/8 >> no acute intracranial abnormality CXR 11/9 >>  pending  CULTURES: BCx x2 11/8 >> pending UCx 11/8 >> pending  ANTIBIOTICS: Zosyn 11/8 >> Vancomycin 11/8 >>  SIGNIFICANT EVENTS: 11/8 >> admitted with AMS and suspected urosepsis, intubated 11/9 >> resolved AMS off pressors, plan for extubation  LINES/TUBES: ETT 11/8 >> PIV R 11/8 >> PIV R-antecubital 11/8 >> LPIV L-antecubital 11/8 >>  DISCUSSION: Patient is a 51 year old wheelchair-bound quadriplegic with a 3 days history of fever and new onset AMS concerning for urosepsis.  ASSESSMENT / PLAN:  PULMONARY Intubated for airway protection: plan to extubate 11/9 Tobacco use disorder - ETT in place,  plan to wean - Continuous pulse ox - Oral hygiene on vent - Tobacco cessation counseling after extubation - Incentive spirometry  CARDIOVASCULAR Hypotension secondary to septic shock: resolved off levophed 11/9, s/p 3 L bolus Sinus tachycardia: likely due to septic shock HFpEF: 65-70% per echo 05/2016 Primary hypertension - Telemetry - SCD/heparin SQ for DVT prophylaxis - Strict I&O, daily weights  RENAL AKI: improving (BL 1.0) Hypokalemia: worsening - Potassium chloride 30 mEq IV x1 dose - Replete electrolytes as necessary - Avoid nephrotoxic agents  GASTROINTESTINAL Nutrition H/o GERD - G-tube - Protonix IV for SUP  INFECTIOUS Septic shock from unknown source 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: culture inconclusive on admission H/o clostridium difficile: no h/o diarrhea, negative colonizing per last toxin screen H/o seizures - Day 2 of Zosyn IV - Day 2 of vancomycin IV - Awaiting blood and urine cultures 11/8 - Contact precaution  NEUROLOGIC Altered mental status likely acute toxic encephalopathy from septic shock 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

## 2017-10-18 NOTE — Progress Notes (Signed)
[facility] Progress Note Patient Name: Phillip [name] DOB: 1966-03-24 MRN: [id number]   Date of Service  10/18/2017  HPI/Events of Note  Hypokalemia, hypomag, hypophos  eICU Interventions  Potassium, mag and phos replaced     Intervention Category Intermediate Interventions: Electrolyte abnormality - evaluation and management  Talley Casco 10/18/2017, 6:41 AM

## 2017-10-18 NOTE — Procedures (Signed)
Extubation Procedure Note  Patient Details:   Name: ABRIEL GEESEY DOB: 1966/06/10 MRN: [id number]   Airway Documentation:  Airway 8 mm (Active)  Secured at (cm) 24 cm 10/18/2017  8:25 AM  Measured From Lips 10/18/2017  8:25 AM  Secured Location Left 10/18/2017  8:25 AM  Secured By [employer] 10/18/2017  8:25 AM  Tube Holder Repositioned Yes 10/18/2017  8:25 AM  Cuff Pressure (cm H2O) 26 cm H2O 10/17/2017  8:11 PM  Site Condition Dry 10/18/2017  8:25 AM    Evaluation  O2 sats: stable throughout Complications: No apparent complications Patient did tolerate procedure well. Bilateral Breath Sounds: Clear, Diminished   Yes   Patient was extubated to a 4L [place] without any complications, dyspnea or stridor noted. Patient was instructed on IS x 5, highest goal reached was 1522mL.  Sheena Donegan L 10/18/2017, 10:30 AM

## 2017-10-18 NOTE — Progress Notes (Signed)
Pt is from [facility] and is a long term pt. Per Santiago Glad pt is able to return once medically stable for discharge. CSW will continue to follow for discharge needs.     Virgie Dad Kaylin Marcon, MSW, [facility] Clinical Social Worker [phone]

## 2017-10-18 NOTE — Consult Note (Addendum)
[facility] Nurse wound consult note Reason for Consult: sacrum/buttocks and left lateral foot wounds Wound type:partial thickness MASD, unstageable pressure ulcer left foot and scar tissue from healed wound on sacrum Pressure Injury POA: Yes Measurement: sacrum is scar tissue from a healed wound that is being protected with foam. Left buttock is 7cm x 7cm partial thickness 100% pink, MASD, being protected with foam. Left lateral foot has a  2.5cm x 2cm x 0.6cm unstageable pressure injury with 50% tan slough, 20% dark maroon non granulating tissue and 30% pink moist tissue. Moderate amt of yellow exudate, unable to determine odor due to other odors. The skin surround wound is darkened but intact.  Wound bed:see above Drainage (amount, consistency, odor) see above Periwound:see above Dressing procedure/placement/frequency: I have provided nurses with orders for Santyl for enzymatic debridement for left foot wound. Continue foam dressing for protection of sacral scar tissue from previous pressure injury. Peel back Q shift to assess site.  Pt is currently on mattress with air loss feature, will order mattress for when pt is transferred out of ICU to regular unit. Pt already has bilateral heel lifting boots from facility, continue with these. We will not follow, but will remain available to this patient, to nursing, and the medical and/or surgical teams.  Please re-consult if we need to assist further.   Fara Olden, RN-C, WTA-C, [facility]ound Treatment Associate Ostomy Care Associate

## 2017-10-19 DIAGNOSIS — R6521 Severe sepsis with septic shock: Secondary | ICD-10-CM

## 2017-10-19 DIAGNOSIS — A419 Sepsis, unspecified organism: Secondary | ICD-10-CM

## 2017-10-19 LAB — MAGNESIUM: MAGNESIUM: 1.7 mg/dL (ref 1.7–2.4)

## 2017-10-19 LAB — MRSA CULTURE: CULTURE: NOT DETECTED

## 2017-10-19 LAB — CBC WITH DIFFERENTIAL/PLATELET
BASOS PCT: 0 %
Basophils Absolute: 0 10*3/uL (ref 0.0–0.1)
EOS ABS: 0.1 10*3/uL (ref 0.0–0.7)
EOS PCT: 1 %
HCT: 25.1 % — ABNORMAL LOW (ref 39.0–52.0)
Hemoglobin: 8.3 g/dL — ABNORMAL LOW (ref 13.0–17.0)
Lymphocytes Relative: 8 %
Lymphs Abs: 0.9 10*3/uL (ref 0.7–4.0)
MCH: 29.4 pg (ref 26.0–34.0)
MCHC: 33.1 g/dL (ref 30.0–36.0)
MCV: 89 fL (ref 78.0–100.0)
MONOS PCT: 2 %
Monocytes Absolute: 0.3 10*3/uL (ref 0.1–1.0)
NEUTROS PCT: 89 %
Neutro Abs: 10.6 10*3/uL — ABNORMAL HIGH (ref 1.7–7.7)
PLATELETS: 184 10*3/uL (ref 150–400)
RBC: 2.82 MIL/uL — ABNORMAL LOW (ref 4.22–5.81)
RDW: 15 % (ref 11.5–15.5)
WBC: 11.9 10*3/uL — ABNORMAL HIGH (ref 4.0–10.5)

## 2017-10-19 LAB — GLUCOSE, CAPILLARY
GLUCOSE-CAPILLARY: 106 mg/dL — AB (ref 65–99)
GLUCOSE-CAPILLARY: 105 mg/dL — AB (ref 65–99)
GLUCOSE-CAPILLARY: 116 mg/dL — AB (ref 65–99)
Glucose-Capillary: 116 mg/dL — ABNORMAL HIGH (ref 65–99)
Glucose-Capillary: 104 mg/dL — ABNORMAL HIGH (ref 65–99)

## 2017-10-19 LAB — RENAL FUNCTION PANEL
ALBUMIN: 2.1 g/dL — AB (ref 3.5–5.0)
ANION GAP: 9 (ref 5–15)
BUN: 12 mg/dL (ref 6–20)
CALCIUM: 7.7 mg/dL — AB (ref 8.9–10.3)
CO2: 21 mmol/L — ABNORMAL LOW (ref 22–32)
Chloride: 106 mmol/L (ref 101–111)
Creatinine, Ser: 1.47 mg/dL — ABNORMAL HIGH (ref 0.61–1.24)
GFR calc non Af Amer: 55 mL/min — ABNORMAL LOW (ref >60)
Glucose, Bld: 107 mg/dL — ABNORMAL HIGH (ref 65–99)
Phosphorus: 2.9 mg/dL (ref 2.5–4.6)
Potassium: 3.5 mmol/L (ref 3.5–5.1)
SODIUM: 136 mmol/L (ref 135–145)

## 2017-10-19 LAB — URINE CULTURE: Culture: 70000 — AB

## 2017-10-19 LAB — PROCALCITONIN: PROCALCITONIN: 76.17 ng/mL

## 2017-10-19 LAB — PHOSPHORUS: PHOSPHORUS: 2.9 mg/dL (ref 2.5–4.6)

## 2017-10-19 LAB — LACTIC ACID, PLASMA: LACTIC ACID, VENOUS: 1.6 mmol/L (ref 0.5–1.9)

## 2017-10-19 MED ORDER — FERROUS SULFATE 325 (65 FE) MG PO TABS
325.0000 mg | ORAL_TABLET | Freq: Two times a day (BID) | ORAL | Status: DC
  Administered 2017-10-19 – 2017-10-25 (×12): 325 mg via ORAL
  Filled 2017-10-19 (×13): qty 1

## 2017-10-19 MED ORDER — LACTATED RINGERS IV BOLUS (SEPSIS)
500.0000 mL | Freq: Once | INTRAVENOUS | Status: AC
  Administered 2017-10-19: 500 mL via INTRAVENOUS

## 2017-10-19 MED ORDER — DEXTROSE 5 % IV SOLN
30.0000 mmol | Freq: Once | INTRAVENOUS | Status: AC
  Administered 2017-10-19: 30 mmol via INTRAVENOUS
  Filled 2017-10-19: qty 10

## 2017-10-19 NOTE — Progress Notes (Signed)
Pt temp 103.1 F Core. Pt currently on cooling blanket.

## 2017-10-19 NOTE — Progress Notes (Signed)
[facility] Attending Note  ADMISSION DATE:10/17/2017  REFERRING MD:Mackuen, Courteney Lyn, MD  CHIEF COMPLAINT:Acute Encephalopathy   Presenting HPI:  50 y.o. male currently living at [facility] with history of incomplete quadriplegia and chronic suprapubic catheter. Patient has had multiple urinary tract infections in the past. Also has known history of decubitus ulcers. Transported to the emergency department with altered mentation and hypotension. Ultimately required endotracheal intubation for airway protection. Received bolus IV fluid and was started on vasopressor infusion. After further fluid resuscitation patient transitioned off vasopressor infusion.  Subjective:  Extubated yesterday. Febrile again this morning. Patient denies any chest pain or pressure. He denies any dyspnea.  Review of Systems:  No abdominal pain or nausea. No subjective fever or chills per patient.   Temp:  [99.2 F (37.3 C)-103.3 F (39.6 C)] 103.1 F (39.5 C) (11/10 1135) Pulse Rate:  [107-135] 126 (11/10 1000) Resp:  [14-40] 40 (11/10 1000) BP: (79-140)/(44-104) 115/94 (11/10 0900) SpO2:  [92 %-100 %] 100 % (11/10 1000) Weight:  [216 lb 4.3 oz (98.1 kg)] 216 lb 4.3 oz (98.1 kg) (11/10 0500)  General:  No family at bedside. Awake. No distress. Integument:  Warm & dry. No rash on exposed skin. HEENT:  Moist mucus memebranes. No scleral icterus.  Neurological: Contractions noted in bilateral hands. No meningismus. Incomplete quadriplegia. Pulmonary:  Normal breathing on room air. Clear to auscultation bilaterally. Cardiovascular:  Regular rate. No appreciable JVD.  Abdomen:  Soft. Nondistended. Normoactive bowel sounds.  LINES/TUBES: OETT 11/8 - 11/9 OGT 11/8 - 11/9 Suprapubic Foley Catheter (Changed 11/9) >>> PIV  CBC Latest Ref Rng & Units 10/19/2017 10/18/2017 10/17/2017  WBC 4.0 - 10.5 K/uL 11.9(H) 16.3(H) -  Hemoglobin 13.0 - 17.0 g/dL 8.3(L) 8.6(L)  9.9(L)  Hematocrit 39.0 - 52.0 % 25.1(L) 26.3(L) 29.0(L)  Platelets 150 - 400 K/uL 184 183 -   BMP Latest Ref Rng & Units 10/19/2017 10/18/2017 10/18/2017  Glucose 65 - 99 mg/dL 107(H) 103(H) 107(H)  BUN 6 - 20 mg/dL 12 16 22(H)  Creatinine 0.61 - 1.24 mg/dL 1.47(H) 1.73(H) 2.09(H)  Sodium 135 - 145 mmol/L 136 138 136  Potassium 3.5 - 5.1 mmol/L 3.5 3.5 3.3(L)  Chloride 101 - 111 mmol/L 106 106 105  CO2 22 - 32 mmol/L 21(L) 20(L) 20(L)  Calcium 8.9 - 10.3 mg/dL 7.7(L) 8.2(L) 7.8(L)   Hepatic Function Latest Ref Rng & Units 10/19/2017 10/18/2017 10/18/2017  Total Protein 6.5 - 8.1 g/dL - - -  Albumin 3.5 - 5.0 g/dL 2.1(L) 2.3(L) 2.1(L)  AST 15 - 41 U/L - - -  ALT 17 - 63 U/L - - -  Alk Phosphatase 38 - 126 U/L - - -  Total Bilirubin 0.3 - 1.2 mg/dL - - -    IMAGING/STUDIES: CT HEAD W/O 11/8:  No acute intracranial abnormality. PORT CXR 11/9:  Similar appearance of the chest x-ray with low lung volumes, unchanged position of endotracheal tube and gastric tube. TTE 11/9:  LV normal in size with EF 55-60%. no regional wall motion abnormalities. LA & RA normal in size. RV normal in size and function. No aortic stenosis or regurgitation. Aortic root normal in size. Mild mitral regurgitation without stenosis. No significant pulmonic regurgitation. Mild tricuspid regurgitation. No pericardial effusion.    MICROBIOLOGY: MRSA PCR 11/8:  Invalid result Blood Cultures x4 11/8 >>> Urine Culture 11/8:  Enterococcus (sensitive Vancomycin, Macrobid, & Ampicillin) Blood Cultures x2 11/10 >>>  ANTIBIOTICS: Vancomycin 11/8 >>> Zosyn 11/8 >>>    SIGNIFICANT EVENTS: 11/08 - Admit  ASSESSMENT/PLAN:  51 y.o. male with incomplete quadriplegia and chronic suprapubic catheter. Patient admitted with septic shock. Shock responded to IV fluids. Urine culture with enterococcus. Patient remains febrile. Given history of culture results I am continuing broad-spectrum antibiotics for now. Respiratory status  remained stable postextubation.  1. Septic shock: Shock has resolved. Continuing empiric antibiotics as above. Awaiting finalization of cultures. Repeat blood cultures today given ongoing fever. Trending Procalcitonin per algorithm. 2. Enterococcus UTI: Continuing empiric anabiotic is above for now. Awaiting blood culture results. 3. Acute encephalopathy: Resolved. Likely secondary to shock. 4. Sacral decubitus: Continuing wound care per consult. 5. Acute renal failure: Steadily improving. Likely due to septic shock. Monitoring urine output. Trending renal function and electrolytes daily. 6. Hypomagnesemia/hypophosphatemia/hypokalemia: Resolved. 7. Anemia: No signs of active bleeding. Trending cell counts daily CBC. Restarting home ferrous sulfate. 8. Incomplete quadriplegia: Continuing baclofen and Neurontin at decreased doses. Holding Elavil. 9. Hyperlipidemia: Continuing Lipitor. 10. Tobacco use disorder: Continuing nicotine patch daily.  Prophylaxis:  Heparin Dewar q8hr, SCDs & Protonix PO daily.  Diet:  Heart Healthy Diet. Code Status:  Full Code. Disposition:  Transferring patient to telemetry unit. CSW already consulted for placement. Family Update:  I have spent a total of 36 minutes of time today caring for the patient, reviewing the patient's electronic medical record, and with more than 50% of that time spent coordinating transfer of care with the patient as well as reviewing the continuing plan of care with the patient at bedside.  TRH to assume care & PCCM off as of 11/11.  Sonia Baller Ashok Cordia, M.D. [facility] Pager:  [phone] After 7pm or if no response, call [phone] 11:57 AM 10/19/17

## 2017-10-19 NOTE — Progress Notes (Signed)
[facility] Progress Note Patient Name: Phillip [name] DOB: Feb 29, 1966 MRN: [id number]   Date of Service  10/19/2017  HPI/Events of Note  Low K, phos  eICU Interventions  repleted     Intervention Category Minor Interventions: Electrolytes abnormality - evaluation and management  Alisea Matte 10/19/2017, 3:13 AM

## 2017-10-19 NOTE — Progress Notes (Signed)
Pt temp 102.7 F orally. RN notified.

## 2017-10-20 LAB — RENAL FUNCTION PANEL
Albumin: 2.3 g/dL — ABNORMAL LOW (ref 3.5–5.0)
Anion gap: 9 (ref 5–15)
BUN: 9 mg/dL (ref 6–20)
CO2: 22 mmol/L (ref 22–32)
Calcium: 8 mg/dL — ABNORMAL LOW (ref 8.9–10.3)
Chloride: 106 mmol/L (ref 101–111)
Creatinine, Ser: 1.29 mg/dL — ABNORMAL HIGH (ref 0.61–1.24)
GFR calc Af Amer: >60 mL/min
GFR calc non Af Amer: >60 mL/min
Glucose, Bld: 99 mg/dL (ref 65–99)
Phosphorus: 2 mg/dL — ABNORMAL LOW (ref 2.5–4.6)
Potassium: 3.5 mmol/L (ref 3.5–5.1)
Sodium: 137 mmol/L (ref 135–145)

## 2017-10-20 LAB — CBC WITH DIFFERENTIAL/PLATELET
Basophils Absolute: 0 10*3/uL (ref 0.0–0.1)
Basophils Relative: 0 %
Eosinophils Absolute: 0.2 10*3/uL (ref 0.0–0.7)
Eosinophils Relative: 1 %
HEMATOCRIT: 26.5 % — AB (ref 39.0–52.0)
HEMOGLOBIN: 8.7 g/dL — AB (ref 13.0–17.0)
LYMPHS ABS: 2.4 10*3/uL (ref 0.7–4.0)
LYMPHS PCT: 14 %
MCH: 29.2 pg (ref 26.0–34.0)
MCHC: 32.8 g/dL (ref 30.0–36.0)
MCV: 88.9 fL (ref 78.0–100.0)
MONOS PCT: 8 %
Monocytes Absolute: 1.3 10*3/uL — ABNORMAL HIGH (ref 0.1–1.0)
NEUTROS ABS: 13.7 10*3/uL — AB (ref 1.7–7.7)
NEUTROS PCT: 77 %
Platelets: 241 10*3/uL (ref 150–400)
RBC: 2.98 MIL/uL — AB (ref 4.22–5.81)
RDW: 15.2 % (ref 11.5–15.5)
WBC: 17.6 10*3/uL — AB (ref 4.0–10.5)

## 2017-10-20 LAB — VANCOMYCIN, TROUGH: VANCOMYCIN TR: 11 ug/mL — AB (ref 15–20)

## 2017-10-20 LAB — GLUCOSE, CAPILLARY
GLUCOSE-CAPILLARY: 92 mg/dL (ref 65–99)
GLUCOSE-CAPILLARY: 96 mg/dL (ref 65–99)
GLUCOSE-CAPILLARY: 97 mg/dL (ref 65–99)
Glucose-Capillary: 97 mg/dL (ref 65–99)
Glucose-Capillary: 85 mg/dL (ref 65–99)

## 2017-10-20 LAB — MAGNESIUM: Magnesium: 1.9 mg/dL (ref 1.7–2.4)

## 2017-10-20 MED ORDER — SODIUM CHLORIDE 0.9 % IV BOLUS (SEPSIS)
1000.0000 mL | Freq: Once | INTRAVENOUS | Status: AC
  Administered 2017-10-20: 1000 mL via INTRAVENOUS

## 2017-10-20 MED ORDER — VANCOMYCIN HCL 10 G IV SOLR
1500.0000 mg | INTRAVENOUS | Status: DC
  Administered 2017-10-20: 1500 mg via INTRAVENOUS
  Filled 2017-10-20 (×2): qty 1500

## 2017-10-20 MED ORDER — AMITRIPTYLINE HCL 50 MG PO TABS
25.0000 mg | ORAL_TABLET | Freq: Once | ORAL | Status: AC
Start: 2017-10-20 — End: 2017-10-20
  Administered 2017-10-20: 25 mg via ORAL
  Filled 2017-10-20: qty 1

## 2017-10-20 NOTE — Progress Notes (Signed)
RN paged Jeannette Corpus, NP to request sleeping pill for patient, awaiting return response.  P.J. Linus Mako, RN

## 2017-10-20 NOTE — Progress Notes (Signed)
RN paged Jeannette Corpus, NP and reported temp of 102.5 and 2 Tylenol given at 2036, temp now 102.6.  Patient does state he feels better than he did. Awaiting response.  P.J. Linus Mako, RN

## 2017-10-20 NOTE — Progress Notes (Signed)
Pharmacy Antibiotic Note  Phillip [name] is a paraplegic 51 y.o. male admitted on 10/17/2017 with sepsis.  Pharmacy has been consulted for Vanc/Zosyn dosing. Patient has been febrile overnight, with Tmax to 103. Cultures show E. Faecalis in urine, resistant to levaquin. Initial blood cultures show no growth to date and repeat blood cultures from yesterday are pending. WBC count remains elevated and trending up to 17.6 today. Patient renal function is varying with baseline Scr around 0.7 and Scr on admission 2.97, now trending down to 1.29.   -vancomycin trough= 11 on 1250mg  IV q24h at ~ 4pm (last dose was at 4:30pm on 11/10)  Plan: Increase vancomycin to 1500mg  IV q24h Goal trough 15-20 mcg/mL. Zosyn 3.375g IV Q8H F/U renal function, clinical status, C&S, and plan for de-escalation  Recent Labs  Lab 10/17/17 1525 10/17/17 1549 10/17/17 1550 10/17/17 2023 10/17/17 2326 10/18/17 0240 10/18/17 1904 10/18/17 2230 10/19/17 1030 10/20/17 0428 10/20/17 1539  WBC 20.7*  --   --   --   --  16.3*  --   --  11.9* 17.6*  --   CREATININE 2.97* 2.80*  --   --   --  2.09* 1.73*  --  1.47* 1.29*  --   LATICACIDVEN  --   --  4.76* 2.4* 1.6  --   --  1.6  --   --   --   VANCOTROUGH  --   --   --   --   --   --   --   --   --   --  11*    Estimated Creatinine Clearance: 80.4 mL/min (A) (by C-G formula based on SCr of 1.29 mg/dL (H)).    No Known Allergies  Antimicrobials this admission: Vanc 11/8 >> Zosyn 11/8 >>  Thank you for allowing pharmacy to be a part of this patient's care.  Hildred Laser, Pharm D 10/20/2017 5:47 PM

## 2017-10-20 NOTE — Progress Notes (Signed)
Pharmacy Antibiotic Note  Phillip [name] is a paraplegic 51 y.o. male admitted on 10/17/2017 with sepsis.  Pharmacy has been consulted for Vanc/Zosyn dosing. Patient has been febrile overnight, with Tmax to 103. Cultures show E. Faecalis in urine, resistant to levaquin. Initial blood cultures show no growth to date and repeat blood cultures from yesterday are pending. WBC count remains elevated and trending up to 17.6 today. Patient renal function is varying with baseline Scr around 0.7 and Scr on admission 2.97, now trending down to 1.29.   Plan: Vanc trough today before dose  Vancomycin 1250mg  IV Q24H  Goal trough 15-20 mcg/mL. Zosyn 3.375g IV Q8H F/U renal function, clinical status, C&S, and plan for de-escalation  Recent Labs  Lab 10/17/17 1525 10/17/17 1549 10/17/17 1550 10/17/17 2023 10/17/17 2326 10/18/17 0240 10/18/17 1904 10/18/17 2230 10/19/17 1030 10/20/17 0428  WBC 20.7*  --   --   --   --  16.3*  --   --  11.9* 17.6*  CREATININE 2.97* 2.80*  --   --   --  2.09* 1.73*  --  1.47* 1.29*  LATICACIDVEN  --   --  4.76* 2.4* 1.6  --   --  1.6  --   --     Estimated Creatinine Clearance: 80.4 mL/min (A) (by C-G formula based on SCr of 1.29 mg/dL (H)).    No Known Allergies  Antimicrobials this admission: Vanc 11/8 >> Zosyn 11/8 >>  Thank you for allowing pharmacy to be a part of this patient's care.  Jalene Mullet, Pharm.D. PGY1 Pharmacy Resident 10/20/2017 9:08 AM Main Pharmacy: [phone]

## 2017-10-20 NOTE — Progress Notes (Signed)
RN notified Jeannette Corpus, NP that temp was 103.0, patient given 2 Tylenol, now temp 101.5 1 hour later.  RN awaiting response and will continue to monitor patient and report any issues to provider. Estill Batten, RN

## 2017-10-20 NOTE — Plan of Care (Signed)
Patient had a fever this morning of 103.0, given 2 Tylenol and fever reduced to 101.5, still monitoring patient.  RN notified provider.  Patient's MRSA culture came back negative and contact precautions discontinued.  RN will continue to monitor patient. P.J. Linus Mako, RN

## 2017-10-20 NOTE — Progress Notes (Signed)
PROGRESS NOTE    Phillip [name]  PJA:[id number] DOB: 05/28/1966 DOA: 10/17/2017 PCP: Patient, No Pcp Per    Brief Narrative 51 y.o. male currently living at [facility] with history of incomplete quadriplegia and chronic suprapubic catheter. Patient has had multiple urinary tract infections in the past. Also has known history of decubitus ulcers. Transported to the emergency department with altered mentation and hypotension.   Assessment & Plan:   Active Problems:   Septic shock ([place]) - Most likely secondary to urinary tract infection - Follow-up with cultures - Continue current broad spectrum antibiotics    Pressure injury of skin - Continue wound care by nursing    Acute respiratory failure with hypoxia ([place]) - Resolved patient breathing comfortably on room air   DVT prophylaxis: SCD's Code Status: Full Family Communication: None at bedside. Disposition Plan: Pending improvement in condition   Consultants:   Wound care nurse   Procedures:    Antimicrobials: Zosyn and Vancomycin   Subjective: Pt has no new complaints, would like to go home soon.  Objective: Vitals:   10/20/17 0604 10/20/17 0700 10/20/17 0720 10/20/17 1519  BP:    (!) 146/92  Pulse:    (!) 110  Resp:    20  Temp: (!) 101.5 F (38.6 C) 99.4 F (37.4 C)  99.6 F (37.6 C)  TempSrc: Oral   Oral  SpO2:    94%  Weight:   97 kg (213 lb 13.5 oz)   Height:        Intake/Output Summary (Last 24 hours) at 10/20/2017 1543 Last data filed at 10/20/2017 1518 Gross per 24 hour  Intake 2435.42 ml  Output 1925 ml  Net 510.42 ml   Filed Weights   10/19/17 0500 10/20/17 0300 10/20/17 0720  Weight: 98.1 kg (216 lb 4.3 oz) 98.5 kg (217 lb 2.5 oz) 97 kg (213 lb 13.5 oz)    Examination:  General exam: Appears calm and comfortable  Respiratory system: Clear to auscultation. Respiratory effort normal. Cardiovascular system: S1 & S2 heard, RRR. No JVD, murmurs, rubs, gallops or  clicks. Gastrointestinal system: Abdomen is nondistended, soft and nontender. No organomegaly or masses felt. Normal bowel sounds heard. Central nervous system: Alert and oriented. quadruplegia Extremities: no cyanosis, warm Skin: No rashes, lesions, + ulcers Psychiatry:  Mood & affect appropriate.     Data Reviewed: I have personally reviewed following labs and imaging studies  CBC: Recent Labs  Lab 10/17/17 1525 10/17/17 1549 10/18/17 0240 10/19/17 1030 10/20/17 0428  WBC 20.7*  --  16.3* 11.9* 17.6*  NEUTROABS 18.1*  --  12.8* 10.6* 13.7*  HGB 10.3* 9.9* 8.6* 8.3* 8.7*  HCT 33.0* 29.0* 26.3* 25.1* 26.5*  MCV 93.2  --  89.2 89.0 88.9  PLT 195  --  183 184 341   Basic Metabolic Panel: Recent Labs  Lab 10/17/17 1525 10/17/17 1549 10/18/17 0240 10/18/17 1904 10/19/17 1030 10/20/17 0428  NA 137 137 136 138 136 137  K 3.4* 4.2 3.3* 3.5 3.5 3.5  CL 101 103 105 106 106 106  CO2 17*  --  20* 20* 21* 22  GLUCOSE 126* 122* 107* 103* 107* 99  BUN 22* 25* 22* 16 12 9   CREATININE 2.97* 2.80* 2.09* 1.73* 1.47* 1.29*  CALCIUM 7.9*  --  7.8* 8.2* 7.7* 8.0*  MG  --   --  1.6*  --  1.7 1.9  PHOS  --   --  1.6* 2.2* 2.9  2.9 2.0*  GFR: Estimated Creatinine Clearance: 80.4 mL/min (A) (by C-G formula based on SCr of 1.29 mg/dL (H)). Liver Function Tests: Recent Labs  Lab 10/17/17 1525 10/18/17 0240 10/18/17 1904 10/19/17 1030 10/20/17 0428  AST 59*  --   --   --   --   ALT 33  --   --   --   --   ALKPHOS 165*  --   --   --   --   BILITOT 1.5*  --   --   --   --   PROT 6.5  --   --   --   --   ALBUMIN 2.5* 2.1* 2.3* 2.1* 2.3*   No results for input(s): LIPASE, AMYLASE in the last 168 hours. No results for input(s): AMMONIA in the last 168 hours. Coagulation Profile: No results for input(s): INR, PROTIME in the last 168 hours. Cardiac Enzymes: No results for input(s): CKTOTAL, CKMB, CKMBINDEX, TROPONINI in the last 168 hours. BNP (last 3 results) No results for  input(s): PROBNP in the last 8760 hours. HbA1C: No results for input(s): HGBA1C in the last 72 hours. CBG: Recent Labs  Lab 10/19/17 1558 10/19/17 2044 10/20/17 0003 10/20/17 0803 10/20/17 1202  GLUCAP 116* 104* 92 97 96   Lipid Profile: No results for input(s): CHOL, HDL, LDLCALC, TRIG, CHOLHDL, LDLDIRECT in the last 72 hours. Thyroid Function Tests: No results for input(s): TSH, T4TOTAL, FREET4, T3FREE, THYROIDAB in the last 72 hours. Anemia Panel: No results for input(s): VITAMINB12, FOLATE, FERRITIN, TIBC, IRON, RETICCTPCT in the last 72 hours. Sepsis Labs: Recent Labs  Lab 10/17/17 1550 10/17/17 2023 10/17/17 2326 10/18/17 0240 10/18/17 2230 10/19/17 1030  PROCALCITON  --  >150.00  --  >150.00  --  76.17  LATICACIDVEN 4.76* 2.4* 1.6  --  1.6  --     Recent Results (from the past 240 hour(s))  Blood Culture (routine x 2)     Status: None (Preliminary result)   Collection Time: 10/17/17  4:29 PM  Result Value Ref Range Status   Specimen Description BLOOD RIGHT HAND  Final   Special Requests   Final    BOTTLES DRAWN AEROBIC AND ANAEROBIC Blood Culture adequate volume   Culture NO GROWTH 3 DAYS  Final   Report Status PENDING  Incomplete  Urine culture     Status: Abnormal   Collection Time: 10/17/17  4:29 PM  Result Value Ref Range Status   Specimen Description URINE, RANDOM  Final   Special Requests NONE  Final   Culture 70,000 COLONIES/mL ENTEROCOCCUS FAECALIS (A)  Final   Report Status 10/19/2017 FINAL  Final   Organism ID, Bacteria ENTEROCOCCUS FAECALIS (A)  Final      Susceptibility   Enterococcus faecalis - MIC*    AMPICILLIN <=2 SENSITIVE Sensitive     LEVOFLOXACIN >=8 RESISTANT Resistant     NITROFURANTOIN <=16 SENSITIVE Sensitive     VANCOMYCIN 1 SENSITIVE Sensitive     * 70,000 COLONIES/mL ENTEROCOCCUS FAECALIS  Blood Culture (routine x 2)     Status: None (Preliminary result)   Collection Time: 10/17/17  4:34 PM  Result Value Ref Range Status    Specimen Description BLOOD LEFT ANTECUBITAL  Final   Special Requests   Final    BOTTLES DRAWN AEROBIC AND ANAEROBIC Blood Culture adequate volume   Culture NO GROWTH 3 DAYS  Final   Report Status PENDING  Incomplete  MRSA PCR Screening     Status: Abnormal   Collection Time: 10/17/17  7:05 PM  Result Value Ref Range Status   MRSA by PCR INVALID RESULTS, SPECIMEN SENT FOR CULTURE (A) NEGATIVE Final    Comment: Results Called to: P. TRIVETTE RN, AT 0530 10/18/17 BY D. VANHOOK        The GeneXpert MRSA Assay (FDA approved for NASAL specimens only), is one component of a comprehensive MRSA colonization surveillance program. It is not intended to diagnose MRSA infection nor to guide or monitor treatment for MRSA infections.   MRSA culture     Status: None   Collection Time: 10/17/17  7:05 PM  Result Value Ref Range Status   Specimen Description NASAL SWAB  Final   Special Requests NONE  Final   Culture NO MRSA DETECTED  Final   Report Status 10/19/2017 FINAL  Final  Culture, blood (routine x 2)     Status: None (Preliminary result)   Collection Time: 10/17/17  8:23 PM  Result Value Ref Range Status   Specimen Description BLOOD RIGHT HAND  Final   Special Requests   Final    BOTTLES DRAWN AEROBIC AND ANAEROBIC Blood Culture adequate volume   Culture NO GROWTH 3 DAYS  Final   Report Status PENDING  Incomplete  Culture, blood (routine x 2)     Status: None (Preliminary result)   Collection Time: 10/17/17  8:31 PM  Result Value Ref Range Status   Specimen Description BLOOD LEFT HAND  Final   Special Requests   Final    BOTTLES DRAWN AEROBIC ONLY Blood Culture adequate volume   Culture NO GROWTH 3 DAYS  Final   Report Status PENDING  Incomplete  Culture, blood (routine x 2)     Status: None (Preliminary result)   Collection Time: 10/19/17  7:00 PM  Result Value Ref Range Status   Specimen Description BLOOD BLOOD RIGHT HAND  Final   Special Requests IN PEDIATRIC BOTTLE Blood  Culture adequate volume  Final   Culture NO GROWTH < 12 HOURS  Final   Report Status PENDING  Incomplete  Culture, blood (routine x 2)     Status: None (Preliminary result)   Collection Time: 10/19/17  7:15 PM  Result Value Ref Range Status   Specimen Description BLOOD RIGHT ANTECUBITAL  Final   Special Requests IN PEDIATRIC BOTTLE Blood Culture adequate volume  Final   Culture NO GROWTH < 12 HOURS  Final   Report Status PENDING  Incomplete         Radiology Studies: No results found.      Scheduled Meds: . atorvastatin  40 mg Oral q1800  . baclofen  5 mg Oral TID  . Chlorhexidine Gluconate Cloth  6 each Topical Q0600  . collagenase   Topical Daily  . ferrous sulfate  325 mg Oral BID WC  . gabapentin  100 mg Oral TID  . heparin  5,000 Units Subcutaneous Q8H  . insulin aspart  0-9 Units Subcutaneous TID WC  . mouth rinse  15 mL Mouth Rinse BID  . mupirocin ointment  1 application Nasal BID  . nicotine  7 mg Transdermal Daily  . pantoprazole  40 mg Oral Daily   Continuous Infusions: . sodium chloride    . lactated ringers 125 mL/hr at 10/20/17 0530  . piperacillin-tazobactam (ZOSYN)  IV 3.375 g (10/20/17 1337)  . vancomycin Stopped (10/19/17 1804)     LOS: 3 days    Time spent: > 35 minutes    Velvet Bathe, MD [facility] [phone]  If  7PM-7AM, please contact night-coverage [email or web address] Password [facility] 10/20/2017, 3:43 PM

## 2017-10-20 NOTE — Progress Notes (Signed)
RN notes that MRSA culture reported on 10/19/2017 states no MRSA detected, will remove contact precautions.  P.J. Linus Mako, RN

## 2017-10-21 LAB — RENAL FUNCTION PANEL
ANION GAP: 7 (ref 5–15)
Albumin: 1.9 g/dL — ABNORMAL LOW (ref 3.5–5.0)
BUN: 9 mg/dL (ref 6–20)
CALCIUM: 7.5 mg/dL — AB (ref 8.9–10.3)
CHLORIDE: 107 mmol/L (ref 101–111)
CO2: 23 mmol/L (ref 22–32)
Creatinine, Ser: 1.15 mg/dL (ref 0.61–1.24)
GFR calc Af Amer: >60 mL/min (ref >60)
GFR calc non Af Amer: >60 mL/min (ref >60)
GLUCOSE: 86 mg/dL (ref 65–99)
Phosphorus: 3.1 mg/dL (ref 2.5–4.6)
Potassium: 3.5 mmol/L (ref 3.5–5.1)
SODIUM: 137 mmol/L (ref 135–145)

## 2017-10-21 LAB — CBC WITH DIFFERENTIAL/PLATELET
BASOS ABS: 0 10*3/uL (ref 0.0–0.1)
Basophils Relative: 0 %
Eosinophils Absolute: 0.3 10*3/uL (ref 0.0–0.7)
Eosinophils Relative: 2 %
HEMATOCRIT: 22.6 % — AB (ref 39.0–52.0)
Hemoglobin: 7.4 g/dL — ABNORMAL LOW (ref 13.0–17.0)
LYMPHS ABS: 3.2 10*3/uL (ref 0.7–4.0)
LYMPHS PCT: 22 %
MCH: 29.2 pg (ref 26.0–34.0)
MCHC: 32.7 g/dL (ref 30.0–36.0)
MCV: 89.3 fL (ref 78.0–100.0)
MONOS PCT: 7 %
Monocytes Absolute: 1 10*3/uL (ref 0.1–1.0)
NEUTROS PCT: 69 %
Neutro Abs: 10 10*3/uL — ABNORMAL HIGH (ref 1.7–7.7)
Platelets: 268 10*3/uL (ref 150–400)
RBC: 2.53 MIL/uL — ABNORMAL LOW (ref 4.22–5.81)
RDW: 15.5 % (ref 11.5–15.5)
WBC: 14.5 10*3/uL — AB (ref 4.0–10.5)

## 2017-10-21 LAB — GLUCOSE, CAPILLARY
GLUCOSE-CAPILLARY: 104 mg/dL — AB (ref 65–99)
GLUCOSE-CAPILLARY: 84 mg/dL (ref 65–99)
GLUCOSE-CAPILLARY: 83 mg/dL (ref 65–99)
GLUCOSE-CAPILLARY: 83 mg/dL (ref 65–99)
Glucose-Capillary: 81 mg/dL (ref 65–99)
Glucose-Capillary: 83 mg/dL (ref 65–99)

## 2017-10-21 LAB — MAGNESIUM: Magnesium: 1.8 mg/dL (ref 1.7–2.4)

## 2017-10-21 MED ORDER — ENSURE ENLIVE PO LIQD
237.0000 mL | Freq: Two times a day (BID) | ORAL | Status: DC
  Administered 2017-10-21 – 2017-10-22 (×3): 237 mL via ORAL

## 2017-10-21 MED ORDER — SODIUM CHLORIDE 0.9 % IV SOLN
3.0000 g | Freq: Four times a day (QID) | INTRAVENOUS | Status: DC
  Administered 2017-10-21 – 2017-10-25 (×15): 3 g via INTRAVENOUS
  Filled 2017-10-21 (×17): qty 3

## 2017-10-21 MED ORDER — SENNOSIDES-DOCUSATE SODIUM 8.6-50 MG PO TABS
1.0000 | ORAL_TABLET | Freq: Two times a day (BID) | ORAL | Status: DC
  Administered 2017-10-21 – 2017-10-25 (×9): 1 via ORAL
  Filled 2017-10-21 (×9): qty 1

## 2017-10-21 MED ORDER — ADULT MULTIVITAMIN W/MINERALS CH
1.0000 | ORAL_TABLET | Freq: Every day | ORAL | Status: DC
  Administered 2017-10-21 – 2017-10-25 (×5): 1 via ORAL
  Filled 2017-10-21 (×5): qty 1

## 2017-10-21 MED ORDER — ZOLPIDEM TARTRATE 5 MG PO TABS
5.0000 mg | ORAL_TABLET | Freq: Once | ORAL | Status: AC
  Administered 2017-10-21: 5 mg via ORAL
  Filled 2017-10-21: qty 1
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
## 2017-10-21 NOTE — Clinical Social Work Note (Signed)
Clinical Social Work Assessment  Patient Details  Name: Phillip [name] MRN: [id number] Date of Birth: November 09, 1966  Date of referral:  10/21/17               Reason for consult:  Facility Placement                Permission sought to share information with:  Chartered certified accountant granted to share information::  Yes, Verbal Permission Granted  Name::        Agency::  [facility]  Relationship::     Contact Information:     Housing/Transportation Living arrangements for the past 2 months:  [place] of Information:  Patient Patient Interpreter Needed:  None Criminal Activity/Legal Involvement Pertinent to Current Situation/Hospitalization:  No - Comment as needed Significant Relationships:  Siblings Lives with:  Facility Resident Do you feel safe going back to the place where you live?  Yes Need for family participation in patient care:  No (Coment)  Care giving concerns:  CSW received consult regarding discharge planning. Patient reports that he comes from [facility] long term care and would like to return at discharge. He declined for CSW to contact his family to update them. CSW to continue to follow and assist with discharge planning needs.   Social Worker assessment / plan:  CSW spoke with patient concerning possibility returning to SNF at discharge.  Employment status:  Disabled (Comment on whether or not currently receiving Disability) Insurance information:  Medicaid In [place] PT Recommendations:  Not assessed at this time Information / Referral to community resources:  [place]  Patient/Family's Response to care:  Patient reports agreement with discharge plan back to [facility] by PTAR.   Patient/Family's Understanding of and Emotional Response to Diagnosis, Current Treatment, and Prognosis:  Patient/family is realistic regarding therapy needs and expressed being hopeful for return to SNF placement soon.  Patient expressed understanding of CSW role and discharge process as well as medical condition. No questions/concerns about plan or treatment.    Emotional Assessment Appearance:  Appears stated age Attitude/Demeanor/Rapport:  Other(Appropriate) Affect (typically observed):  Accepting, Pleasant, Appropriate Orientation:  Oriented to Self, Oriented to Situation, Oriented to Place, Oriented to  Time Alcohol / Substance use:  Not Applicable Psych involvement (Current and /or in the community):  No (Comment)  Discharge Needs  Concerns to be addressed:  Care Coordination Readmission within the last 30 days:  No Current discharge risk:  None Barriers to Discharge:  Continued Medical Work up   [employer], [place] 10/21/2017, 2:01 PM

## 2017-10-21 NOTE — Progress Notes (Signed)
Initial Nutrition Assessment  DOCUMENTATION CODES:   Obesity unspecified  INTERVENTION:  1. Ensure Enlive po BID, each supplement provides 350 kcal and 20 grams of protein 2. MVI w/ Minerals 3. Recommend Thiamine 100mg , Folic Acid 1mg  daily  NUTRITION DIAGNOSIS:   Increased nutrient needs related to wound healing as evidenced by estimated needs.  GOAL:   Patient will meet greater than or equal to 90% of their needs  MONITOR:   PO intake, I & O's, Supplement acceptance, Labs, Weight trends  REASON FOR ASSESSMENT:   Low [place]    ASSESSMENT:   Phillip [name] is a 51 yo wheelchair bound man who has a PMH of ETOH abuse, Tobacco abuse, quadriplegia, GERD, CHF presents with septic shock likely secondary to UTI.   Spoke with Phillip [name] at bedside. He reports normally eat eggs with grits and pancakes for breakfast. Will eat a hamburger and french fries for lunch. Normally has dinner "in a box, or a bottle." Reports nausea for 4 days PTA but still ate during that time. Denies vomiting. Reports a UBW of ~200 pounds. Has been hovering around 207-213 pounds until today when he weighed 230 pounds, likely on bed scale. Didn't eat anything for breakfast, had some soup, jello for lunch, but reports not really being hungry especially following extubation. He was agreeable to consuming ensure, encouraged PO intake and protein intake to help with wound healing.  Labs reviewed Medications reviewed and include:  Insulin, Iron, Senokot-S LR at 17mL/hr   Intake/Output Summary (Last 24 hours) at 10/21/2017 1514 Last data filed at 10/21/2017 0700 Gross per 24 hour  Intake 3687.5 ml  Output 1400 ml  Net 2287.5 ml     NUTRITION - FOCUSED PHYSICAL EXAM:    Most Recent Value  Orbital Region  No depletion  Upper Arm Region  No depletion  Thoracic and Lumbar Region  No depletion  Buccal Region  No depletion  Temple Region  No depletion  Clavicle Bone Region  No depletion  Clavicle and  Acromion Bone Region  No depletion  Scapular Bone Region  No depletion  Dorsal Hand  No depletion  Patellar Region  No depletion  Anterior Thigh Region  No depletion  Posterior Calf Region  No depletion  Edema (RD Assessment)  None  Hair  Reviewed  Eyes  Reviewed  Mouth  Reviewed  Skin  Reviewed  Nails  Reviewed       Diet Order:  Diet Heart Room service appropriate? Yes; Fluid consistency: Thin  EDUCATION NEEDS:   Education needs have been addressed  Skin:  Skin Assessment: Skin Integrity Issues: Skin Integrity Issues:: Unstageable, Stage II Stage II: at thigh Unstageable: at buttocks and foot  Last BM:  10/20/2017  Height:   Ht Readings from Last 1 Encounters:  10/19/17 5\' 9"  (1.753 m)    Weight:   Wt Readings from Last 1 Encounters:  10/21/17 230 lb 2.6 oz (104.4 kg)    Ideal Body Weight:  65.45 kg  BMI:  Body mass index is 33.99 kg/m.  Estimated Nutritional Needs:   Kcal:  1900-2100 calories  Protein:  140-160 grams  Fluid:  1.9-2.1L  Satira Anis. Hilton Saephan, MS, RD LDN Inpatient Clinical Dietitian Pager [phone]

## 2017-10-21 NOTE — Progress Notes (Signed)
Pharmacy Antibiotic Note  Phillip [name] is a paraplegic 51 y.o. male admitted on 10/17/2017 with sepsis concerning for UTI source. Urine cultures are now growing Enterococcus faecalis - all other cultures are ngtd. Discussed with MD and will narrow Vancomycin + Zosyn to Unasyn. Once able to take po - can consider Amoxicillin.   Noted paraplegia with AKI on admit - now resolving, SCr down to 1.15 (baseline appears to be <1), CrCl hard to estimate at this time.   Plan: 1. D/c Vancomycin + Zosyn 2. Start Unasyn 3g IV every 6 hours 3. Will continue to follow renal function, culture results, LOT, and antibiotic de-escalation plans   Recent Labs  Lab 10/17/17 1525  10/17/17 1550 10/17/17 2023 10/17/17 2326 10/18/17 0240 10/18/17 1904 10/18/17 2230 10/19/17 1030 10/20/17 0428 10/20/17 1539 10/21/17 0423  WBC 20.7*  --   --   --   --  16.3*  --   --  11.9* 17.6*  --  14.5*  CREATININE 2.97*   < >  --   --   --  2.09* 1.73*  --  1.47* 1.29*  --  1.15  LATICACIDVEN  --   --  4.76* 2.4* 1.6  --   --  1.6  --   --   --   --   VANCOTROUGH  --   --   --   --   --   --   --   --   --   --  11*  --    < > = values in this interval not displayed.    Estimated Creatinine Clearance: 93.6 mL/min (by C-G formula based on SCr of 1.15 mg/dL).    No Known Allergies  Antimicrobials this admission: Vanc 11/8 >> Zosyn 11/8 >>  -11/12: vancomycin trough= 11 on 1250mg  IV q24h at ~ 4pm (last dose was at 4:30pm on 11/10)  11/8 Bcx x 4 neg 11/8 Ucx: 38 k E Faecalis (R-levaquin)   Thank you for allowing pharmacy to be a part of this patient's care.  Alycia Rossetti, PharmD, BCPS Clinical Pharmacist Pager: [phone] Clinical phone for 10/21/2017 from 7a-3:30p: [phone] If after 3:30p, please call main pharmacy at: [phone] 10/21/2017 3:58 PM

## 2017-10-21 NOTE — Plan of Care (Signed)
Patient continues to have intermittent fevers and required Tylenol 2 tabs and 1000 ml bolus NS this shift.  RN will continue to monitor patient and report any issues to provider. P.J. Linus Mako, RN

## 2017-10-21 NOTE — Progress Notes (Signed)
PROGRESS NOTE    Phillip [name]  HYI:[id number] DOB: 1966-09-17 DOA: 10/17/2017 PCP: Patient, No Pcp Per    Brief Narrative 51 y.o. male currently living at [facility] with history of incomplete quadriplegia and chronic suprapubic catheter. Patient has had multiple urinary tract infections in the past. Also has known history of decubitus ulcers. Transported to the emergency department with altered mentation and hypotension.   Assessment & Plan:   Active Problems:   Septic shock ([place]) - Most likely secondary to urinary tract infection: growing enterococcus faecalis will d/c vancomycin and zosyn and place on Unasyn. Discussed with pharmacy. - Follow-up with cultures - Continue current broad spectrum antibiotics    Pressure injury of skin - Continue wound care by nursing    Acute respiratory failure with hypoxia ([place]) - Resolved patient breathing comfortably on room air   DVT prophylaxis: SCD's Code Status: Full Family Communication: None at bedside. Disposition Plan: Pending improvement in condition   Consultants:   Wound care nurse   Procedures:    Antimicrobials: Zosyn and Vancomycin>>> Unasyn 10/21/17   Subjective: Pt has no new complaints, is looking forward to going home soon.  Objective: Vitals:   10/21/17 0439 10/21/17 0500 10/21/17 0624 10/21/17 1419  BP:   (!) 155/98 (!) 142/86  Pulse:   (!) 101 (!) 107  Resp:   20 18  Temp: 98.5 F (36.9 C)  98.5 F (36.9 C) (!) 100.5 F (38.1 C)  TempSrc: Oral  Oral Oral  SpO2:   98% 96%  Weight:  104.4 kg (230 lb 2.6 oz)    Height:        Intake/Output Summary (Last 24 hours) at 10/21/2017 1638 Last data filed at 10/21/2017 0700 Gross per 24 hour  Intake 3687.5 ml  Output 800 ml  Net 2887.5 ml   Filed Weights   10/20/17 0300 10/20/17 0720 10/21/17 0500  Weight: 98.5 kg (217 lb 2.5 oz) 97 kg (213 lb 13.5 oz) 104.4 kg (230 lb 2.6 oz)    Examination: Exam unchanged when compared to  10/20/2017  General exam: Appears calm and comfortable  Respiratory system: Clear to auscultation. Respiratory effort normal. Cardiovascular system: S1 & S2 heard, RRR. No JVD, murmurs, rubs, gallops or clicks. Gastrointestinal system: Abdomen is nondistended, soft and nontender. No organomegaly or masses felt. Normal bowel sounds heard. Central nervous system: Alert and oriented. quadruplegia Extremities: no cyanosis, warm Skin: No rashes, lesions, + ulcers Psychiatry:  Mood & affect appropriate.     Data Reviewed: I have personally reviewed following labs and imaging studies  CBC: Recent Labs  Lab 10/17/17 1525 10/17/17 1549 10/18/17 0240 10/19/17 1030 10/20/17 0428 10/21/17 0423  WBC 20.7*  --  16.3* 11.9* 17.6* 14.5*  NEUTROABS 18.1*  --  12.8* 10.6* 13.7* 10.0*  HGB 10.3* 9.9* 8.6* 8.3* 8.7* 7.4*  HCT 33.0* 29.0* 26.3* 25.1* 26.5* 22.6*  MCV 93.2  --  89.2 89.0 88.9 89.3  PLT 195  --  183 184 241 786   Basic Metabolic Panel: Recent Labs  Lab 10/18/17 0240 10/18/17 1904 10/19/17 1030 10/20/17 0428 10/21/17 0423  NA 136 138 136 137 137  K 3.3* 3.5 3.5 3.5 3.5  CL 105 106 106 106 107  CO2 20* 20* 21* 22 23  GLUCOSE 107* 103* 107* 99 86  BUN 22* 16 12 9 9   CREATININE 2.09* 1.73* 1.47* 1.29* 1.15  CALCIUM 7.8* 8.2* 7.7* 8.0* 7.5*  MG 1.6*  --  1.7 1.9 1.8  PHOS 1.6* 2.2* 2.9  2.9 2.0* 3.1   GFR: Estimated Creatinine Clearance: 93.6 mL/min (by C-G formula based on SCr of 1.15 mg/dL). Liver Function Tests: Recent Labs  Lab 10/17/17 1525 10/18/17 0240 10/18/17 1904 10/19/17 1030 10/20/17 0428 10/21/17 0423  AST 59*  --   --   --   --   --   ALT 33  --   --   --   --   --   ALKPHOS 165*  --   --   --   --   --   BILITOT 1.5*  --   --   --   --   --   PROT 6.5  --   --   --   --   --   ALBUMIN 2.5* 2.1* 2.3* 2.1* 2.3* 1.9*   No results for input(s): LIPASE, AMYLASE in the last 168 hours. No results for input(s): AMMONIA in the last 168  hours. Coagulation Profile: No results for input(s): INR, PROTIME in the last 168 hours. Cardiac Enzymes: No results for input(s): CKTOTAL, CKMB, CKMBINDEX, TROPONINI in the last 168 hours. BNP (last 3 results) No results for input(s): PROBNP in the last 8760 hours. HbA1C: No results for input(s): HGBA1C in the last 72 hours. CBG: Recent Labs  Lab 10/20/17 2018 10/21/17 0437 10/21/17 0758 10/21/17 1143 10/21/17 1559  GLUCAP 85 81 84 83 83   Lipid Profile: No results for input(s): CHOL, HDL, LDLCALC, TRIG, CHOLHDL, LDLDIRECT in the last 72 hours. Thyroid Function Tests: No results for input(s): TSH, T4TOTAL, FREET4, T3FREE, THYROIDAB in the last 72 hours. Anemia Panel: No results for input(s): VITAMINB12, FOLATE, FERRITIN, TIBC, IRON, RETICCTPCT in the last 72 hours. Sepsis Labs: Recent Labs  Lab 10/17/17 1550 10/17/17 2023 10/17/17 2326 10/18/17 0240 10/18/17 2230 10/19/17 1030  PROCALCITON  --  >150.00  --  >150.00  --  76.17  LATICACIDVEN 4.76* 2.4* 1.6  --  1.6  --     Recent Results (from the past 240 hour(s))  Blood Culture (routine x 2)     Status: None (Preliminary result)   Collection Time: 10/17/17  4:29 PM  Result Value Ref Range Status   Specimen Description BLOOD RIGHT HAND  Final   Special Requests   Final    BOTTLES DRAWN AEROBIC AND ANAEROBIC Blood Culture adequate volume   Culture NO GROWTH 4 DAYS  Final   Report Status PENDING  Incomplete  Urine culture     Status: Abnormal   Collection Time: 10/17/17  4:29 PM  Result Value Ref Range Status   Specimen Description URINE, RANDOM  Final   Special Requests NONE  Final   Culture 70,000 COLONIES/mL ENTEROCOCCUS FAECALIS (A)  Final   Report Status 10/19/2017 FINAL  Final   Organism ID, Bacteria ENTEROCOCCUS FAECALIS (A)  Final      Susceptibility   Enterococcus faecalis - MIC*    AMPICILLIN <=2 SENSITIVE Sensitive     LEVOFLOXACIN >=8 RESISTANT Resistant     NITROFURANTOIN <=16 SENSITIVE Sensitive      VANCOMYCIN 1 SENSITIVE Sensitive     * 70,000 COLONIES/mL ENTEROCOCCUS FAECALIS  Blood Culture (routine x 2)     Status: None (Preliminary result)   Collection Time: 10/17/17  4:34 PM  Result Value Ref Range Status   Specimen Description BLOOD LEFT ANTECUBITAL  Final   Special Requests   Final    BOTTLES DRAWN AEROBIC AND ANAEROBIC Blood Culture adequate volume   Culture NO GROWTH  4 DAYS  Final   Report Status PENDING  Incomplete  MRSA PCR Screening     Status: Abnormal   Collection Time: 10/17/17  7:05 PM  Result Value Ref Range Status   MRSA by PCR INVALID RESULTS, SPECIMEN SENT FOR CULTURE (A) NEGATIVE Final    Comment: Results Called to: P. TRIVETTE RN, AT 0530 10/18/17 BY D. VANHOOK        The GeneXpert MRSA Assay (FDA approved for NASAL specimens only), is one component of a comprehensive MRSA colonization surveillance program. It is not intended to diagnose MRSA infection nor to guide or monitor treatment for MRSA infections.   MRSA culture     Status: None   Collection Time: 10/17/17  7:05 PM  Result Value Ref Range Status   Specimen Description NASAL SWAB  Final   Special Requests NONE  Final   Culture NO MRSA DETECTED  Final   Report Status 10/19/2017 FINAL  Final  Culture, blood (routine x 2)     Status: None (Preliminary result)   Collection Time: 10/17/17  8:23 PM  Result Value Ref Range Status   Specimen Description BLOOD RIGHT HAND  Final   Special Requests   Final    BOTTLES DRAWN AEROBIC AND ANAEROBIC Blood Culture adequate volume   Culture NO GROWTH 4 DAYS  Final   Report Status PENDING  Incomplete  Culture, blood (routine x 2)     Status: None (Preliminary result)   Collection Time: 10/17/17  8:31 PM  Result Value Ref Range Status   Specimen Description BLOOD LEFT HAND  Final   Special Requests   Final    BOTTLES DRAWN AEROBIC ONLY Blood Culture adequate volume   Culture NO GROWTH 4 DAYS  Final   Report Status PENDING  Incomplete  Culture, blood  (routine x 2)     Status: None (Preliminary result)   Collection Time: 10/19/17  7:00 PM  Result Value Ref Range Status   Specimen Description BLOOD BLOOD RIGHT HAND  Final   Special Requests IN PEDIATRIC BOTTLE Blood Culture adequate volume  Final   Culture NO GROWTH 2 DAYS  Final   Report Status PENDING  Incomplete  Culture, blood (routine x 2)     Status: None (Preliminary result)   Collection Time: 10/19/17  7:15 PM  Result Value Ref Range Status   Specimen Description BLOOD RIGHT ANTECUBITAL  Final   Special Requests IN PEDIATRIC BOTTLE Blood Culture adequate volume  Final   Culture NO GROWTH 2 DAYS  Final   Report Status PENDING  Incomplete         Radiology Studies: No results found.      Scheduled Meds: . atorvastatin  40 mg Oral q1800  . baclofen  5 mg Oral TID  . collagenase   Topical Daily  . feeding supplement (ENSURE ENLIVE)  237 mL Oral BID BM  . ferrous sulfate  325 mg Oral BID WC  . gabapentin  100 mg Oral TID  . heparin  5,000 Units Subcutaneous Q8H  . insulin aspart  0-9 Units Subcutaneous TID WC  . mouth rinse  15 mL Mouth Rinse BID  . multivitamin with minerals  1 tablet Oral Daily  . nicotine  7 mg Transdermal Daily  . pantoprazole  40 mg Oral Daily  . senna-docusate  1 tablet Oral BID   Continuous Infusions: . sodium chloride    . ampicillin-sulbactam (UNASYN) IV    . lactated ringers 125 mL/hr at 10/20/17 2352  LOS: 4 days    Time spent: > 35 minutes    Velvet Bathe, MD [facility] [phone]  If 7PM-7AM, please contact night-coverage [email or web address] Password [facility] 10/21/2017, 4:38 PM

## 2017-10-22 LAB — CULTURE, BLOOD (ROUTINE X 2)
CULTURE: NO GROWTH
CULTURE: NO GROWTH
Culture: NO GROWTH
Culture: NO GROWTH
SPECIAL REQUESTS: ADEQUATE
SPECIAL REQUESTS: ADEQUATE
Special Requests: ADEQUATE
Special Requests: ADEQUATE

## 2017-10-22 LAB — MAGNESIUM: MAGNESIUM: 1.8 mg/dL (ref 1.7–2.4)

## 2017-10-22 LAB — CBC WITH DIFFERENTIAL/PLATELET
BASOS ABS: 0 10*3/uL (ref 0.0–0.1)
Basophils Relative: 0 %
Eosinophils Absolute: 0 10*3/uL (ref 0.0–0.7)
Eosinophils Relative: 0 %
HCT: 23.6 % — ABNORMAL LOW (ref 39.0–52.0)
HEMOGLOBIN: 7.8 g/dL — AB (ref 13.0–17.0)
LYMPHS ABS: 1.4 10*3/uL (ref 0.7–4.0)
Lymphocytes Relative: 7 %
MCH: 29.5 pg (ref 26.0–34.0)
MCHC: 33.1 g/dL (ref 30.0–36.0)
MCV: 89.4 fL (ref 78.0–100.0)
MONOS PCT: 5 %
Monocytes Absolute: 1 10*3/uL (ref 0.1–1.0)
NEUTROS ABS: 17.3 10*3/uL — AB (ref 1.7–7.7)
Neutrophils Relative %: 88 %
Platelets: 309 10*3/uL (ref 150–400)
RBC: 2.64 MIL/uL — ABNORMAL LOW (ref 4.22–5.81)
RDW: 15.5 % (ref 11.5–15.5)
WBC: 19.7 10*3/uL — ABNORMAL HIGH (ref 4.0–10.5)

## 2017-10-22 LAB — RENAL FUNCTION PANEL
ANION GAP: 8 (ref 5–15)
Albumin: 1.9 g/dL — ABNORMAL LOW (ref 3.5–5.0)
BUN: 6 mg/dL (ref 6–20)
CHLORIDE: 107 mmol/L (ref 101–111)
CO2: 21 mmol/L — AB (ref 22–32)
Calcium: 7.5 mg/dL — ABNORMAL LOW (ref 8.9–10.3)
Creatinine, Ser: 1.22 mg/dL (ref 0.61–1.24)
GFR calc non Af Amer: >60 mL/min (ref >60)
Glucose, Bld: 93 mg/dL (ref 65–99)
POTASSIUM: 3.4 mmol/L — AB (ref 3.5–5.1)
Phosphorus: 2.7 mg/dL (ref 2.5–4.6)
Sodium: 136 mmol/L (ref 135–145)

## 2017-10-22 LAB — GLUCOSE, CAPILLARY
GLUCOSE-CAPILLARY: 86 mg/dL (ref 65–99)
GLUCOSE-CAPILLARY: 85 mg/dL (ref 65–99)
GLUCOSE-CAPILLARY: 84 mg/dL (ref 65–99)
GLUCOSE-CAPILLARY: 92 mg/dL (ref 65–99)
GLUCOSE-CAPILLARY: 102 mg/dL — AB (ref 65–99)
Glucose-Capillary: 92 mg/dL (ref 65–99)

## 2017-10-22 MED ORDER — SODIUM CHLORIDE 0.9 % IV BOLUS (SEPSIS)
500.0000 mL | Freq: Once | INTRAVENOUS | Status: AC
  Administered 2017-10-22: 500 mL via INTRAVENOUS

## 2017-10-22 MED ORDER — BISACODYL 5 MG PO TBEC
5.0000 mg | DELAYED_RELEASE_TABLET | Freq: Every day | ORAL | Status: DC | PRN
  Administered 2017-10-22: 5 mg via ORAL
  Filled 2017-10-22: qty 1

## 2017-10-22 MED ORDER — ACETAMINOPHEN 325 MG PO TABS
325.0000 mg | ORAL_TABLET | Freq: Once | ORAL | Status: AC
  Administered 2017-10-22: 325 mg via ORAL
  Filled 2017-10-22: qty 1

## 2017-10-22 NOTE — Progress Notes (Signed)
Temp rechecked after 500 cc NS bolus and 325 mg po tylenol and was 102.9-MD made aware. He wanted next 650 mg po tylenol dose to be given. Ice packs remain to pt's axillary. Will ctm.

## 2017-10-22 NOTE — Progress Notes (Signed)
Phillip [name]  DZH:[id number] DOB: May 05, 1966 DOA: 10/17/2017 PCP: Patient, No Pcp Per    Brief Narrative 51 y.o. male currently living at [facility] with history of incomplete quadriplegia and chronic suprapubic catheter. Patient has had multiple urinary tract infections in the past. Also has known history of decubitus ulcers. Transported to the emergency department with altered mentation and hypotension.  Pt still spiking fevers while on IV antibiotics. Awaiting for patient to defervesce.   Assessment & Plan:   Active Problems:   Septic shock ([place]) - Most likely secondary to urinary tract infection: growing enterococcus faecalis will d/c Vancomycin and Zosyn and place on Unasyn. Discussed with pharmacy. - Follow-up with cultures - Continue current broad spectrum antibiotics while patient still spiking fevers. Once fever free for 24 hours would de escalate and place on oral antibiotics    Pressure injury of skin - Continue wound care by nursing    Acute respiratory failure with hypoxia ([place]) - Resolved patient breathing comfortably on room air  DVT prophylaxis: SCD's Code Status: Full Family Communication: None at bedside. Disposition Plan: Pending improvement in condition   Consultants:   Wound care nurse   Procedures:    Antimicrobials: Zosyn and Vancomycin>>> Unasyn 10/21/17  Subjective: No new complaints reported to me.  Objective: Vitals:   10/22/17 0615 10/22/17 0618 10/22/17 1450 10/22/17 1459  BP: 119/81  (!) 154/107   Pulse: 97  (!) 104   Resp:   17   Temp:   99.7 F (37.6 C) 98 F (36.7 C)  TempSrc:   Oral   SpO2:   93%   Weight:  104.6 kg (230 lb 9.6 oz)    Height:        Intake/Output Summary (Last 24 hours) at 10/22/2017 1544 Last data filed at 10/22/2017 0331 Gross per 24 hour  Intake 1952.08 ml  Output 500 ml  Net 1452.08 ml   Filed Weights   10/20/17 0720 10/21/17 0500 10/22/17 0618  Weight: 97 kg  (213 lb 13.5 oz) 104.4 kg (230 lb 2.6 oz) 104.6 kg (230 lb 9.6 oz)    Examination: Exam unchanged when compared to 10/21/2017  General exam: Appears calm and comfortable  Respiratory system: Clear to auscultation. Respiratory effort normal. Cardiovascular system: S1 & S2 heard, RRR. No JVD, murmurs, rubs, gallops or clicks. Gastrointestinal system: Abdomen is nondistended, soft and nontender. No organomegaly or masses felt. Normal bowel sounds heard. Central nervous system: Alert and oriented. quadruplegia Extremities: no cyanosis, warm Skin: No rashes, lesions, + ulcers Psychiatry:  Mood & affect appropriate.   Data Reviewed: I have personally reviewed following labs and imaging studies  CBC: Recent Labs  Lab 10/18/17 0240 10/19/17 1030 10/20/17 0428 10/21/17 0423 10/22/17 0452  WBC 16.3* 11.9* 17.6* 14.5* 19.7*  NEUTROABS 12.8* 10.6* 13.7* 10.0* 17.3*  HGB 8.6* 8.3* 8.7* 7.4* 7.8*  HCT 26.3* 25.1* 26.5* 22.6* 23.6*  MCV 89.2 89.0 88.9 89.3 89.4  PLT 183 184 241 268 419   Basic Metabolic Panel: Recent Labs  Lab 10/18/17 0240 10/18/17 1904 10/19/17 1030 10/20/17 0428 10/21/17 0423 10/22/17 0452  NA 136 138 136 137 137 136  K 3.3* 3.5 3.5 3.5 3.5 3.4*  CL 105 106 106 106 107 107  CO2 20* 20* 21* 22 23 21*  GLUCOSE 107* 103* 107* 99 86 93  BUN 22* 16 12 9 9 6   CREATININE 2.09* 1.73* 1.47* 1.29* 1.15 1.22  CALCIUM 7.8* 8.2* 7.7* 8.0* 7.5*  7.5*  MG 1.6*  --  1.7 1.9 1.8 1.8  PHOS 1.6* 2.2* 2.9  2.9 2.0* 3.1 2.7   GFR: Estimated Creatinine Clearance: 88.3 mL/min (by C-G formula based on SCr of 1.22 mg/dL). Liver Function Tests: Recent Labs  Lab 10/17/17 1525  10/18/17 1904 10/19/17 1030 10/20/17 0428 10/21/17 0423 10/22/17 0452  AST 59*  --   --   --   --   --   --   ALT 33  --   --   --   --   --   --   ALKPHOS 165*  --   --   --   --   --   --   BILITOT 1.5*  --   --   --   --   --   --   PROT 6.5  --   --   --   --   --   --   ALBUMIN 2.5*   < > 2.3*  2.1* 2.3* 1.9* 1.9*   < > = values in this interval not displayed.   No results for input(s): LIPASE, AMYLASE in the last 168 hours. No results for input(s): AMMONIA in the last 168 hours. Coagulation Profile: No results for input(s): INR, PROTIME in the last 168 hours. Cardiac Enzymes: No results for input(s): CKTOTAL, CKMB, CKMBINDEX, TROPONINI in the last 168 hours. BNP (last 3 results) No results for input(s): PROBNP in the last 8760 hours. HbA1C: No results for input(s): HGBA1C in the last 72 hours. CBG: Recent Labs  Lab 10/21/17 1956 10/22/17 0021 10/22/17 0431 10/22/17 0757 10/22/17 1221  GLUCAP 83 92 86 85 84   Lipid Profile: No results for input(s): CHOL, HDL, LDLCALC, TRIG, CHOLHDL, LDLDIRECT in the last 72 hours. Thyroid Function Tests: No results for input(s): TSH, T4TOTAL, FREET4, T3FREE, THYROIDAB in the last 72 hours. Anemia Panel: No results for input(s): VITAMINB12, FOLATE, FERRITIN, TIBC, IRON, RETICCTPCT in the last 72 hours. Sepsis Labs: Recent Labs  Lab 10/17/17 1550 10/17/17 2023 10/17/17 2326 10/18/17 0240 10/18/17 2230 10/19/17 1030  PROCALCITON  --  >150.00  --  >150.00  --  76.17  LATICACIDVEN 4.76* 2.4* 1.6  --  1.6  --     Recent Results (from the past 240 hour(s))  Blood Culture (routine x 2)     Status: None   Collection Time: 10/17/17  4:29 PM  Result Value Ref Range Status   Specimen Description BLOOD RIGHT HAND  Final   Special Requests   Final    BOTTLES DRAWN AEROBIC AND ANAEROBIC Blood Culture adequate volume   Culture NO GROWTH 5 DAYS  Final   Report Status 10/22/2017 FINAL  Final  Urine culture     Status: Abnormal   Collection Time: 10/17/17  4:29 PM  Result Value Ref Range Status   Specimen Description URINE, RANDOM  Final   Special Requests NONE  Final   Culture 70,000 COLONIES/mL ENTEROCOCCUS FAECALIS (A)  Final   Report Status 10/19/2017 FINAL  Final   Organism ID, Bacteria ENTEROCOCCUS FAECALIS (A)  Final       Susceptibility   Enterococcus faecalis - MIC*    AMPICILLIN <=2 SENSITIVE Sensitive     LEVOFLOXACIN >=8 RESISTANT Resistant     NITROFURANTOIN <=16 SENSITIVE Sensitive     VANCOMYCIN 1 SENSITIVE Sensitive     * 70,000 COLONIES/mL ENTEROCOCCUS FAECALIS  Blood Culture (routine x 2)     Status: None   Collection Time: 10/17/17  4:34 PM  Result Value Ref Range Status   Specimen Description BLOOD LEFT ANTECUBITAL  Final   Special Requests   Final    BOTTLES DRAWN AEROBIC AND ANAEROBIC Blood Culture adequate volume   Culture NO GROWTH 5 DAYS  Final   Report Status 10/22/2017 FINAL  Final  MRSA PCR Screening     Status: Abnormal   Collection Time: 10/17/17  7:05 PM  Result Value Ref Range Status   MRSA by PCR INVALID RESULTS, SPECIMEN SENT FOR CULTURE (A) NEGATIVE Final    Comment: Results Called to: P. TRIVETTE RN, AT 0530 10/18/17 BY D. VANHOOK        The GeneXpert MRSA Assay (FDA approved for NASAL specimens only), is one component of a comprehensive MRSA colonization surveillance program. It is not intended to diagnose MRSA infection nor to guide or monitor treatment for MRSA infections.   MRSA culture     Status: None   Collection Time: 10/17/17  7:05 PM  Result Value Ref Range Status   Specimen Description NASAL SWAB  Final   Special Requests NONE  Final   Culture NO MRSA DETECTED  Final   Report Status 10/19/2017 FINAL  Final  Culture, blood (routine x 2)     Status: None   Collection Time: 10/17/17  8:23 PM  Result Value Ref Range Status   Specimen Description BLOOD RIGHT HAND  Final   Special Requests   Final    BOTTLES DRAWN AEROBIC AND ANAEROBIC Blood Culture adequate volume   Culture NO GROWTH 5 DAYS  Final   Report Status 10/22/2017 FINAL  Final  Culture, blood (routine x 2)     Status: None   Collection Time: 10/17/17  8:31 PM  Result Value Ref Range Status   Specimen Description BLOOD LEFT HAND  Final   Special Requests   Final    BOTTLES DRAWN AEROBIC ONLY  Blood Culture adequate volume   Culture NO GROWTH 5 DAYS  Final   Report Status 10/22/2017 FINAL  Final  Culture, blood (routine x 2)     Status: None (Preliminary result)   Collection Time: 10/19/17  7:00 PM  Result Value Ref Range Status   Specimen Description BLOOD BLOOD RIGHT HAND  Final   Special Requests IN PEDIATRIC BOTTLE Blood Culture adequate volume  Final   Culture NO GROWTH 3 DAYS  Final   Report Status PENDING  Incomplete  Culture, blood (routine x 2)     Status: None (Preliminary result)   Collection Time: 10/19/17  7:15 PM  Result Value Ref Range Status   Specimen Description BLOOD RIGHT ANTECUBITAL  Final   Special Requests IN PEDIATRIC BOTTLE Blood Culture adequate volume  Final   Culture NO GROWTH 3 DAYS  Final   Report Status PENDING  Incomplete         Radiology Studies: No results found.      Scheduled Meds: . atorvastatin  40 mg Oral q1800  . baclofen  5 mg Oral TID  . collagenase   Topical Daily  . feeding supplement (ENSURE ENLIVE)  237 mL Oral BID BM  . ferrous sulfate  325 mg Oral BID WC  . gabapentin  100 mg Oral TID  . heparin  5,000 Units Subcutaneous Q8H  . insulin aspart  0-9 Units Subcutaneous TID WC  . mouth rinse  15 mL Mouth Rinse BID  . multivitamin with minerals  1 tablet Oral Daily  . nicotine  7 mg Transdermal Daily  .  pantoprazole  40 mg Oral Daily  . senna-docusate  1 tablet Oral BID   Continuous Infusions: . sodium chloride    . ampicillin-sulbactam (UNASYN) IV Stopped (10/22/17 1401)  . lactated ringers 125 mL/hr at 10/22/17 1028     LOS: 5 days    Time spent: > 35 minutes  Velvet Bathe, MD [facility] [phone]  If 7PM-7AM, please contact night-coverage [email or web address] Password [facility] 10/22/2017, 3:44 PM

## 2017-10-22 NOTE — Progress Notes (Signed)
Pt had a fever of 102.2 orally @ 2240. 650 mg of po tylenol was given to pt at that time. Pt's temp rechecked and increased to 102.8. MD notified-pt not due for next dose of tylenol until 02:45 am. Cold cloths placed on pt in the meantime. Will continue to monitor and assess.  *MD order to give 500cc NS bolus x1 and 325 mg po tylenol. Will monitor effect.

## 2017-10-23 ENCOUNTER — Other Ambulatory Visit: Payer: Self-pay

## 2017-10-23 ENCOUNTER — Encounter (HOSPITAL_COMMUNITY): Payer: Self-pay | Admitting: General Practice

## 2017-10-23 DIAGNOSIS — L89309 Pressure ulcer of unspecified buttock, unspecified stage: Secondary | ICD-10-CM

## 2017-10-23 DIAGNOSIS — N390 Urinary tract infection, site not specified: Secondary | ICD-10-CM

## 2017-10-23 LAB — RENAL FUNCTION PANEL
ALBUMIN: 2.2 g/dL — AB (ref 3.5–5.0)
ANION GAP: 9 (ref 5–15)
BUN: 5 mg/dL — AB (ref 6–20)
CHLORIDE: 106 mmol/L (ref 101–111)
CO2: 23 mmol/L (ref 22–32)
Calcium: 8 mg/dL — ABNORMAL LOW (ref 8.9–10.3)
Creatinine, Ser: 0.92 mg/dL (ref 0.61–1.24)
GFR calc Af Amer: >60 mL/min (ref >60)
Glucose, Bld: 103 mg/dL — ABNORMAL HIGH (ref 65–99)
PHOSPHORUS: 2.8 mg/dL (ref 2.5–4.6)
POTASSIUM: 3.2 mmol/L — AB (ref 3.5–5.1)
Sodium: 138 mmol/L (ref 135–145)

## 2017-10-23 LAB — CBC WITH DIFFERENTIAL/PLATELET
BASOS PCT: 0 %
Basophils Absolute: 0 10*3/uL (ref 0.0–0.1)
EOS PCT: 1 %
Eosinophils Absolute: 0.2 10*3/uL (ref 0.0–0.7)
HEMATOCRIT: 24.4 % — AB (ref 39.0–52.0)
Hemoglobin: 8 g/dL — ABNORMAL LOW (ref 13.0–17.0)
LYMPHS ABS: 3.4 10*3/uL (ref 0.7–4.0)
Lymphocytes Relative: 17 %
MCH: 29 pg (ref 26.0–34.0)
MCHC: 32.8 g/dL (ref 30.0–36.0)
MCV: 88.4 fL (ref 78.0–100.0)
MONO ABS: 1.6 10*3/uL — AB (ref 0.1–1.0)
MONOS PCT: 8 %
Neutro Abs: 14.7 10*3/uL — ABNORMAL HIGH (ref 1.7–7.7)
Neutrophils Relative %: 74 %
PLATELETS: 333 10*3/uL (ref 150–400)
RBC: 2.76 MIL/uL — AB (ref 4.22–5.81)
RDW: 15.1 % (ref 11.5–15.5)
WBC: 19.9 10*3/uL — ABNORMAL HIGH (ref 4.0–10.5)

## 2017-10-23 LAB — MAGNESIUM: Magnesium: 1.7 mg/dL (ref 1.7–2.4)

## 2017-10-23 LAB — GLUCOSE, CAPILLARY
GLUCOSE-CAPILLARY: 107 mg/dL — AB (ref 65–99)
GLUCOSE-CAPILLARY: 88 mg/dL (ref 65–99)
GLUCOSE-CAPILLARY: 92 mg/dL (ref 65–99)
GLUCOSE-CAPILLARY: 93 mg/dL (ref 65–99)
GLUCOSE-CAPILLARY: 97 mg/dL (ref 65–99)
Glucose-Capillary: 107 mg/dL — ABNORMAL HIGH (ref 65–99)

## 2017-10-23 MED ORDER — ZOLPIDEM TARTRATE 5 MG PO TABS
5.0000 mg | ORAL_TABLET | Freq: Every evening | ORAL | Status: DC | PRN
  Administered 2017-10-23 – 2017-10-24 (×2): 5 mg via ORAL
  Filled 2017-10-23 (×2): qty 1

## 2017-10-23 NOTE — Progress Notes (Signed)
PROGRESS NOTE  Phillip [name] [id number]:[id number] DOB: 09/30/1966 DOA: 10/17/2017 PCP: Patient, No Pcp Per   LOS: 6 days   Brief Narrative / Interim history: 51 y.o.male currently living at [facility] with history of incomplete quadriplegia and chronic suprapubic catheter. Patient has had multiple urinary tract infections in the past. Also has known history of decubitus ulcers. Transported to the emergency department with altered mentation and hypotension.    Assessment & Plan: Active Problems:   Septic shock (HCC)   Pressure injury of skin   Acute respiratory failure with hypoxia (HCC)   Septic shock -Sepsis physiology resolved, this is likely due to UTI, he is growing Enterococcus faecalis -Patient currently on Unasyn, continue IV, white count is still elevated at 19. -Afebrile last night, last fever about 1.5 days ago  Pressure injury of skin -Sacral ulcer looks like it is healing, no drainage,  Acute respiratory failure hypoxia -In the setting of severe sepsis and septic shock, resolved, currently on room air  Paraplegia -supportive treatment, baclofen  HLD -continue statin   DVT prophylaxis: heparin Code Status: Full code Family Communication: no family at bedside Disposition Plan: home 1-2 days  Consultants:   None   Procedures:   None   Antimicrobials:  Vancomycin / Zosyn admit >> 11/12  Unasyn 11/12 >>   Subjective: - no chest pain, shortness of breath, no abdominal pain, nausea or vomiting.   Objective: Vitals:   10/22/17 2135 10/22/17 2153 10/23/17 0422 10/23/17 0423  BP:  (!) 144/82 (!) 155/91 (!) 158/93  Pulse:  (!) 107 98 95  Resp:  20 18   Temp: (!) 100.8 F (38.2 C) 99.5 F (37.5 C) 99.1 F (37.3 C)   TempSrc: Oral Oral Oral   SpO2:  93% 96%   Weight:    98.4 kg (217 lb)  Height:        Intake/Output Summary (Last 24 hours) at 10/23/2017 1210 Last data filed at 10/23/2017 1115 Gross per 24 hour  Intake -  Output  3200 ml  Net -3200 ml   Filed Weights   10/21/17 0500 10/22/17 0618 10/23/17 0423  Weight: 104.4 kg (230 lb 2.6 oz) 104.6 kg (230 lb 9.6 oz) 98.4 kg (217 lb)    Examination:  Constitutional: NAD Eyes: lids and conjunctivae normal ENMT: Mucous membranes are moist Respiratory: clear to auscultation bilaterally, no wheezing, no crackles.  Cardiovascular: Regular rate and rhythm, no murmurs / rubs / gallops. No LE edema.  Abdomen: no tenderness. Bowel sounds positive.  Skin: sacral pressure injury, no drainage, no tunneling Psychiatric: Normal judgment and insight. Alert and oriented x 3. Normal mood.    Data Reviewed: I have independently reviewed following labs and imaging studies   CBC: Recent Labs  Lab 10/19/17 1030 10/20/17 0428 10/21/17 0423 10/22/17 0452 10/23/17 0703  WBC 11.9* 17.6* 14.5* 19.7* 19.9*  NEUTROABS 10.6* 13.7* 10.0* 17.3* 14.7*  HGB 8.3* 8.7* 7.4* 7.8* 8.0*  HCT 25.1* 26.5* 22.6* 23.6* 24.4*  MCV 89.0 88.9 89.3 89.4 88.4  PLT 184 241 268 309 509   Basic Metabolic Panel: Recent Labs  Lab 10/19/17 1030 10/20/17 0428 10/21/17 0423 10/22/17 0452 10/23/17 0703  NA 136 137 137 136 138  K 3.5 3.5 3.5 3.4* 3.2*  CL 106 106 107 107 106  CO2 21* 22 23 21* 23  GLUCOSE 107* 99 86 93 103*  BUN 12 9 9 6  5*  CREATININE 1.47* 1.29* 1.15 1.22 0.92  CALCIUM 7.7* 8.0* 7.5* 7.5*  8.0*  MG 1.7 1.9 1.8 1.8 1.7  PHOS 2.9  2.9 2.0* 3.1 2.7 2.8   GFR: Estimated Creatinine Clearance: 113.6 mL/min (by C-G formula based on SCr of 0.92 mg/dL). Liver Function Tests: Recent Labs  Lab 10/17/17 1525  10/19/17 1030 10/20/17 0428 10/21/17 0423 10/22/17 0452 10/23/17 0703  AST 59*  --   --   --   --   --   --   ALT 33  --   --   --   --   --   --   ALKPHOS 165*  --   --   --   --   --   --   BILITOT 1.5*  --   --   --   --   --   --   PROT 6.5  --   --   --   --   --   --   ALBUMIN 2.5*   < > 2.1* 2.3* 1.9* 1.9* 2.2*   < > = values in this interval not  displayed.   No results for input(s): LIPASE, AMYLASE in the last 168 hours. No results for input(s): AMMONIA in the last 168 hours. Coagulation Profile: No results for input(s): INR, PROTIME in the last 168 hours. Cardiac Enzymes: No results for input(s): CKTOTAL, CKMB, CKMBINDEX, TROPONINI in the last 168 hours. BNP (last 3 results) No results for input(s): PROBNP in the last 8760 hours. HbA1C: No results for input(s): HGBA1C in the last 72 hours. CBG: Recent Labs  Lab 10/22/17 1623 10/22/17 1956 10/23/17 0032 10/23/17 0418 10/23/17 0759  GLUCAP 92 102* 93 107* 97   Lipid Profile: No results for input(s): CHOL, HDL, LDLCALC, TRIG, CHOLHDL, LDLDIRECT in the last 72 hours. Thyroid Function Tests: No results for input(s): TSH, T4TOTAL, FREET4, T3FREE, THYROIDAB in the last 72 hours. Anemia Panel: No results for input(s): VITAMINB12, FOLATE, FERRITIN, TIBC, IRON, RETICCTPCT in the last 72 hours. Urine analysis:    Component Value Date/Time   COLORURINE AMBER (A) 10/17/2017 1629   APPEARANCEUR CLOUDY (A) 10/17/2017 1629   LABSPEC 1.021 10/17/2017 1629   PHURINE 5.0 10/17/2017 1629   GLUCOSEU NEGATIVE 10/17/2017 1629   HGBUR LARGE (A) 10/17/2017 1629   BILIRUBINUR NEGATIVE 10/17/2017 1629   KETONESUR NEGATIVE 10/17/2017 1629   PROTEINUR 100 (A) 10/17/2017 1629   UROBILINOGEN 0.2 08/23/2015 0247   NITRITE NEGATIVE 10/17/2017 1629   LEUKOCYTESUR LARGE (A) 10/17/2017 1629   Sepsis Labs: Invalid input(s): PROCALCITONIN, LACTICIDVEN  Recent Results (from the past 240 hour(s))  Blood Culture (routine x 2)     Status: None   Collection Time: 10/17/17  4:29 PM  Result Value Ref Range Status   Specimen Description BLOOD RIGHT HAND  Final   Special Requests   Final    BOTTLES DRAWN AEROBIC AND ANAEROBIC Blood Culture adequate volume   Culture NO GROWTH 5 DAYS  Final   Report Status 10/22/2017 FINAL  Final  Urine culture     Status: Abnormal   Collection Time: 10/17/17  4:29  PM  Result Value Ref Range Status   Specimen Description URINE, RANDOM  Final   Special Requests NONE  Final   Culture 70,000 COLONIES/mL ENTEROCOCCUS FAECALIS (A)  Final   Report Status 10/19/2017 FINAL  Final   Organism ID, Bacteria ENTEROCOCCUS FAECALIS (A)  Final      Susceptibility   Enterococcus faecalis - MIC*    AMPICILLIN <=2 SENSITIVE Sensitive     LEVOFLOXACIN >=8 RESISTANT Resistant  NITROFURANTOIN <=16 SENSITIVE Sensitive     VANCOMYCIN 1 SENSITIVE Sensitive     * 70,000 COLONIES/mL ENTEROCOCCUS FAECALIS  Blood Culture (routine x 2)     Status: None   Collection Time: 10/17/17  4:34 PM  Result Value Ref Range Status   Specimen Description BLOOD LEFT ANTECUBITAL  Final   Special Requests   Final    BOTTLES DRAWN AEROBIC AND ANAEROBIC Blood Culture adequate volume   Culture NO GROWTH 5 DAYS  Final   Report Status 10/22/2017 FINAL  Final  MRSA PCR Screening     Status: Abnormal   Collection Time: 10/17/17  7:05 PM  Result Value Ref Range Status   MRSA by PCR INVALID RESULTS, SPECIMEN SENT FOR CULTURE (A) NEGATIVE Final    Comment: Results Called to: P. TRIVETTE RN, AT 0530 10/18/17 BY D. VANHOOK        The GeneXpert MRSA Assay (FDA approved for NASAL specimens only), is one component of a comprehensive MRSA colonization surveillance program. It is not intended to diagnose MRSA infection nor to guide or monitor treatment for MRSA infections.   MRSA culture     Status: None   Collection Time: 10/17/17  7:05 PM  Result Value Ref Range Status   Specimen Description NASAL SWAB  Final   Special Requests NONE  Final   Culture NO MRSA DETECTED  Final   Report Status 10/19/2017 FINAL  Final  Culture, blood (routine x 2)     Status: None   Collection Time: 10/17/17  8:23 PM  Result Value Ref Range Status   Specimen Description BLOOD RIGHT HAND  Final   Special Requests   Final    BOTTLES DRAWN AEROBIC AND ANAEROBIC Blood Culture adequate volume   Culture NO  GROWTH 5 DAYS  Final   Report Status 10/22/2017 FINAL  Final  Culture, blood (routine x 2)     Status: None   Collection Time: 10/17/17  8:31 PM  Result Value Ref Range Status   Specimen Description BLOOD LEFT HAND  Final   Special Requests   Final    BOTTLES DRAWN AEROBIC ONLY Blood Culture adequate volume   Culture NO GROWTH 5 DAYS  Final   Report Status 10/22/2017 FINAL  Final  Culture, blood (routine x 2)     Status: None (Preliminary result)   Collection Time: 10/19/17  7:00 PM  Result Value Ref Range Status   Specimen Description BLOOD BLOOD RIGHT HAND  Final   Special Requests IN PEDIATRIC BOTTLE Blood Culture adequate volume  Final   Culture NO GROWTH 3 DAYS  Final   Report Status PENDING  Incomplete  Culture, blood (routine x 2)     Status: None (Preliminary result)   Collection Time: 10/19/17  7:15 PM  Result Value Ref Range Status   Specimen Description BLOOD RIGHT ANTECUBITAL  Final   Special Requests IN PEDIATRIC BOTTLE Blood Culture adequate volume  Final   Culture NO GROWTH 3 DAYS  Final   Report Status PENDING  Incomplete      Radiology Studies: No results found.   Scheduled Meds: . atorvastatin  40 mg Oral q1800  . baclofen  5 mg Oral TID  . collagenase   Topical Daily  . feeding supplement (ENSURE ENLIVE)  237 mL Oral BID BM  . ferrous sulfate  325 mg Oral BID WC  . gabapentin  100 mg Oral TID  . heparin  5,000 Units Subcutaneous Q8H  . insulin aspart  0-9  Units Subcutaneous TID WC  . mouth rinse  15 mL Mouth Rinse BID  . multivitamin with minerals  1 tablet Oral Daily  . nicotine  7 mg Transdermal Daily  . pantoprazole  40 mg Oral Daily  . senna-docusate  1 tablet Oral BID   Continuous Infusions: . sodium chloride    . ampicillin-sulbactam (UNASYN) IV Stopped (10/23/17 1021)  . lactated ringers 125 mL/hr at 10/23/17 [place], MD, PhD [facility] Pager [phone]  If 7PM-7AM, please contact  night-coverage [email or web address] Password [email or web address] 10/23/2017, 12:10 PM

## 2017-10-24 ENCOUNTER — Inpatient Hospital Stay (HOSPITAL_COMMUNITY): Payer: Medicaid Other

## 2017-10-24 DIAGNOSIS — D72829 Elevated white blood cell count, unspecified: Secondary | ICD-10-CM

## 2017-10-24 LAB — CBC WITH DIFFERENTIAL/PLATELET
BASOS ABS: 0 10*3/uL (ref 0.0–0.1)
Basophils Relative: 0 %
EOS ABS: 0.2 10*3/uL (ref 0.0–0.7)
Eosinophils Relative: 1 %
HCT: 25.6 % — ABNORMAL LOW (ref 39.0–52.0)
HEMOGLOBIN: 8.5 g/dL — AB (ref 13.0–17.0)
LYMPHS PCT: 15 %
Lymphs Abs: 3.4 10*3/uL (ref 0.7–4.0)
MCH: 29.4 pg (ref 26.0–34.0)
MCHC: 33.2 g/dL (ref 30.0–36.0)
MCV: 88.6 fL (ref 78.0–100.0)
Monocytes Absolute: 1.4 10*3/uL — ABNORMAL HIGH (ref 0.1–1.0)
Monocytes Relative: 6 %
NEUTROS ABS: 17.7 10*3/uL — AB (ref 1.7–7.7)
NEUTROS PCT: 78 %
Platelets: 377 10*3/uL (ref 150–400)
RBC: 2.89 MIL/uL — ABNORMAL LOW (ref 4.22–5.81)
RDW: 15.3 % (ref 11.5–15.5)
WBC: 22.7 10*3/uL — ABNORMAL HIGH (ref 4.0–10.5)

## 2017-10-24 LAB — CULTURE, BLOOD (ROUTINE X 2)
CULTURE: NO GROWTH
Culture: NO GROWTH
Special Requests: ADEQUATE
Special Requests: ADEQUATE

## 2017-10-24 LAB — GLUCOSE, CAPILLARY
GLUCOSE-CAPILLARY: 101 mg/dL — AB (ref 65–99)
GLUCOSE-CAPILLARY: 114 mg/dL — AB (ref 65–99)
GLUCOSE-CAPILLARY: 95 mg/dL (ref 65–99)
Glucose-Capillary: 110 mg/dL — ABNORMAL HIGH (ref 65–99)
Glucose-Capillary: 110 mg/dL — ABNORMAL HIGH (ref 65–99)
Glucose-Capillary: 91 mg/dL (ref 65–99)

## 2017-10-24 LAB — MAGNESIUM: MAGNESIUM: 1.6 mg/dL — AB (ref 1.7–2.4)

## 2017-10-24 LAB — RENAL FUNCTION PANEL
ALBUMIN: 2.3 g/dL — AB (ref 3.5–5.0)
ANION GAP: 9 (ref 5–15)
BUN: <5 mg/dL — ABNORMAL LOW (ref 6–20)
CALCIUM: 7.9 mg/dL — AB (ref 8.9–10.3)
CO2: 24 mmol/L (ref 22–32)
Chloride: 107 mmol/L (ref 101–111)
Creatinine, Ser: 0.83 mg/dL (ref 0.61–1.24)
GLUCOSE: 112 mg/dL — AB (ref 65–99)
PHOSPHORUS: 3.3 mg/dL (ref 2.5–4.6)
Potassium: 2.9 mmol/L — ABNORMAL LOW (ref 3.5–5.1)
SODIUM: 140 mmol/L (ref 135–145)

## 2017-10-24 MED ORDER — CLONIDINE HCL 0.1 MG PO TABS
0.1000 mg | ORAL_TABLET | Freq: Two times a day (BID) | ORAL | Status: DC
  Administered 2017-10-24 – 2017-10-25 (×3): 0.1 mg via ORAL
  Filled 2017-10-24 (×3): qty 1

## 2017-10-24 MED ORDER — POTASSIUM CHLORIDE CRYS ER 20 MEQ PO TBCR
40.0000 meq | EXTENDED_RELEASE_TABLET | ORAL | Status: AC
  Administered 2017-10-24 (×2): 40 meq via ORAL
  Filled 2017-10-24 (×2): qty 2

## 2017-10-24 MED ORDER — METOPROLOL TARTRATE 12.5 MG HALF TABLET
12.5000 mg | ORAL_TABLET | Freq: Two times a day (BID) | ORAL | Status: DC
  Administered 2017-10-24 – 2017-10-25 (×3): 12.5 mg via ORAL
  Filled 2017-10-24 (×3): qty 1

## 2017-10-24 NOTE — Progress Notes (Signed)
PROGRESS NOTE  Phillip BARTLESON OHY:[id number] DOB: Oct 02, 1966 DOA: 10/17/2017 PCP: Patient, No Pcp Per   LOS: 7 days   Brief Narrative / Interim history: 51 y.o.male currently living at [facility] with history of incomplete quadriplegia and chronic suprapubic catheter. Patient has had multiple urinary tract infections in the past. Also has known history of decubitus ulcers. Transported to the emergency department with altered mentation and hypotension.    Assessment & Plan: Active Problems:   Septic shock (HCC)   Pressure injury of skin   Acute respiratory failure with hypoxia (HCC)   Septic shock -Sepsis physiology resolved, this is likely due to UTI, he is growing Enterococcus faecalis -Patient currently on Unasyn, continue IV, white count continues to climb today to 22K -Fever curve improved, he is now afebrile, however given persistent leukocytosis will obtain a renal ultrasound  Pressure injury of skin -Sacral ulcer looks like it is healing, no drainage,  Acute respiratory failure hypoxia -In the setting of severe sepsis and septic shock, resolved, currently on room air  Paraplegia -supportive treatment, baclofen  HLD -continue statin   DVT prophylaxis: heparin Code Status: Full code Family Communication: no family at bedside Disposition Plan: home 1-2 days once white count starts trending down  Consultants:   None   Procedures:   None   Antimicrobials:  Vancomycin / Zosyn admit >> 11/12  Unasyn 11/12 >>   Subjective: -Feels well, denies any pain, no shortness of breath, no cough.  Wants to go home.  Objective: Vitals:   10/23/17 1400 10/23/17 2107 10/24/17 0514 10/24/17 0827  BP: (!) 154/99 (!) 153/93 (!) 160/96 (!) 160/98  Pulse: (!) 101 (!) 104 100 (!) 102  Resp: 18 18 18  (!) 24  Temp: 99 F (37.2 C) 99 F (37.2 C) 99.8 F (37.7 C) 98.9 F (37.2 C)  TempSrc: Oral Oral Oral Oral  SpO2: 91% 94% 95% 94%  Weight:      Height:         Intake/Output Summary (Last 24 hours) at 10/24/2017 1225 Last data filed at 10/24/2017 1046 Gross per 24 hour  Intake 444 ml  Output 6275 ml  Net -5831 ml   Filed Weights   10/21/17 0500 10/22/17 0618 10/23/17 0423  Weight: 104.4 kg (230 lb 2.6 oz) 104.6 kg (230 lb 9.6 oz) 98.4 kg (217 lb)    Examination:  Constitutional: NAD Eyes: No scleral icterus ENMT: Moist mucous membranes Respiratory: Clear to auscultation, moves air well, no wheezing or crackles Cardiovascular: Regular rate and rhythm, no murmurs.  Trace lower extremity edema Abdomen: Nontender, bowel sounds Skin: sacral pressure injury, no drainage, no tunneling Psychiatric: Normal mood, alert and oriented x3   Data Reviewed: I have independently reviewed following labs and imaging studies   CBC: Recent Labs  Lab 10/20/17 0428 10/21/17 0423 10/22/17 0452 10/23/17 0703 10/24/17 0534  WBC 17.6* 14.5* 19.7* 19.9* 22.7*  NEUTROABS 13.7* 10.0* 17.3* 14.7* 17.7*  HGB 8.7* 7.4* 7.8* 8.0* 8.5*  HCT 26.5* 22.6* 23.6* 24.4* 25.6*  MCV 88.9 89.3 89.4 88.4 88.6  PLT 241 268 309 333 948   Basic Metabolic Panel: Recent Labs  Lab 10/20/17 0428 10/21/17 0423 10/22/17 0452 10/23/17 0703 10/24/17 0534  NA 137 137 136 138 140  K 3.5 3.5 3.4* 3.2* 2.9*  CL 106 107 107 106 107  CO2 22 23 21* 23 24  GLUCOSE 99 86 93 103* 112*  BUN 9 9 6  5* <5*  CREATININE 1.29* 1.15 1.22 0.92  0.83  CALCIUM 8.0* 7.5* 7.5* 8.0* 7.9*  MG 1.9 1.8 1.8 1.7 1.6*  PHOS 2.0* 3.1 2.7 2.8 3.3   GFR: Estimated Creatinine Clearance: 125.9 mL/min (by C-G formula based on SCr of 0.83 mg/dL). Liver Function Tests: Recent Labs  Lab 10/17/17 1525  10/20/17 0428 10/21/17 0423 10/22/17 0452 10/23/17 0703 10/24/17 0534  AST 59*  --   --   --   --   --   --   ALT 33  --   --   --   --   --   --   ALKPHOS 165*  --   --   --   --   --   --   BILITOT 1.5*  --   --   --   --   --   --   PROT 6.5  --   --   --   --   --   --   ALBUMIN  2.5*   < > 2.3* 1.9* 1.9* 2.2* 2.3*   < > = values in this interval not displayed.   No results for input(s): LIPASE, AMYLASE in the last 168 hours. No results for input(s): AMMONIA in the last 168 hours. Coagulation Profile: No results for input(s): INR, PROTIME in the last 168 hours. Cardiac Enzymes: No results for input(s): CKTOTAL, CKMB, CKMBINDEX, TROPONINI in the last 168 hours. BNP (last 3 results) No results for input(s): PROBNP in the last 8760 hours. HbA1C: No results for input(s): HGBA1C in the last 72 hours. CBG: Recent Labs  Lab 10/23/17 1701 10/23/17 2018 10/24/17 0030 10/24/17 0512 10/24/17 0814  GLUCAP 88 92 114* 101* 110*   Lipid Profile: No results for input(s): CHOL, HDL, LDLCALC, TRIG, CHOLHDL, LDLDIRECT in the last 72 hours. Thyroid Function Tests: No results for input(s): TSH, T4TOTAL, FREET4, T3FREE, THYROIDAB in the last 72 hours. Anemia Panel: No results for input(s): VITAMINB12, FOLATE, FERRITIN, TIBC, IRON, RETICCTPCT in the last 72 hours. Urine analysis:    Component Value Date/Time   COLORURINE AMBER (A) 10/17/2017 1629   APPEARANCEUR CLOUDY (A) 10/17/2017 1629   LABSPEC 1.021 10/17/2017 1629   PHURINE 5.0 10/17/2017 1629   GLUCOSEU NEGATIVE 10/17/2017 1629   HGBUR LARGE (A) 10/17/2017 1629   BILIRUBINUR NEGATIVE 10/17/2017 1629   KETONESUR NEGATIVE 10/17/2017 1629   PROTEINUR 100 (A) 10/17/2017 1629   UROBILINOGEN 0.2 08/23/2015 0247   NITRITE NEGATIVE 10/17/2017 1629   LEUKOCYTESUR LARGE (A) 10/17/2017 1629   Sepsis Labs: Invalid input(s): PROCALCITONIN, LACTICIDVEN  Recent Results (from the past 240 hour(s))  Blood Culture (routine x 2)     Status: None   Collection Time: 10/17/17  4:29 PM  Result Value Ref Range Status   Specimen Description BLOOD RIGHT HAND  Final   Special Requests   Final    BOTTLES DRAWN AEROBIC AND ANAEROBIC Blood Culture adequate volume   Culture NO GROWTH 5 DAYS  Final   Report Status 10/22/2017 FINAL   Final  Urine culture     Status: Abnormal   Collection Time: 10/17/17  4:29 PM  Result Value Ref Range Status   Specimen Description URINE, RANDOM  Final   Special Requests NONE  Final   Culture 70,000 COLONIES/mL ENTEROCOCCUS FAECALIS (A)  Final   Report Status 10/19/2017 FINAL  Final   Organism ID, Bacteria ENTEROCOCCUS FAECALIS (A)  Final      Susceptibility   Enterococcus faecalis - MIC*    AMPICILLIN <=2 SENSITIVE Sensitive  LEVOFLOXACIN >=8 RESISTANT Resistant     NITROFURANTOIN <=16 SENSITIVE Sensitive     VANCOMYCIN 1 SENSITIVE Sensitive     * 70,000 COLONIES/mL ENTEROCOCCUS FAECALIS  Blood Culture (routine x 2)     Status: None   Collection Time: 10/17/17  4:34 PM  Result Value Ref Range Status   Specimen Description BLOOD LEFT ANTECUBITAL  Final   Special Requests   Final    BOTTLES DRAWN AEROBIC AND ANAEROBIC Blood Culture adequate volume   Culture NO GROWTH 5 DAYS  Final   Report Status 10/22/2017 FINAL  Final  MRSA PCR Screening     Status: Abnormal   Collection Time: 10/17/17  7:05 PM  Result Value Ref Range Status   MRSA by PCR INVALID RESULTS, SPECIMEN SENT FOR CULTURE (A) NEGATIVE Final    Comment: Results Called to: P. TRIVETTE RN, AT 0530 10/18/17 BY D. VANHOOK        The GeneXpert MRSA Assay (FDA approved for NASAL specimens only), is one component of a comprehensive MRSA colonization surveillance program. It is not intended to diagnose MRSA infection nor to guide or monitor treatment for MRSA infections.   MRSA culture     Status: None   Collection Time: 10/17/17  7:05 PM  Result Value Ref Range Status   Specimen Description NASAL SWAB  Final   Special Requests NONE  Final   Culture NO MRSA DETECTED  Final   Report Status 10/19/2017 FINAL  Final  Culture, blood (routine x 2)     Status: None   Collection Time: 10/17/17  8:23 PM  Result Value Ref Range Status   Specimen Description BLOOD RIGHT HAND  Final   Special Requests   Final    BOTTLES  DRAWN AEROBIC AND ANAEROBIC Blood Culture adequate volume   Culture NO GROWTH 5 DAYS  Final   Report Status 10/22/2017 FINAL  Final  Culture, blood (routine x 2)     Status: None   Collection Time: 10/17/17  8:31 PM  Result Value Ref Range Status   Specimen Description BLOOD LEFT HAND  Final   Special Requests   Final    BOTTLES DRAWN AEROBIC ONLY Blood Culture adequate volume   Culture NO GROWTH 5 DAYS  Final   Report Status 10/22/2017 FINAL  Final  Culture, blood (routine x 2)     Status: None (Preliminary result)   Collection Time: 10/19/17  7:00 PM  Result Value Ref Range Status   Specimen Description BLOOD BLOOD RIGHT HAND  Final   Special Requests IN PEDIATRIC BOTTLE Blood Culture adequate volume  Final   Culture NO GROWTH 4 DAYS  Final   Report Status PENDING  Incomplete  Culture, blood (routine x 2)     Status: None (Preliminary result)   Collection Time: 10/19/17  7:15 PM  Result Value Ref Range Status   Specimen Description BLOOD RIGHT ANTECUBITAL  Final   Special Requests IN PEDIATRIC BOTTLE Blood Culture adequate volume  Final   Culture NO GROWTH 4 DAYS  Final   Report Status PENDING  Incomplete      Radiology Studies: US Renal  Result Date: 10/24/2017 CLINICAL DATA:  Leukocytosis EXAM: RENAL / URINARY TRACT ULTRASOUND COMPLETE COMPARISON:  Renal ultrasound 02/16/2016, CT 06/11/2015 FINDINGS: Right Kidney: Length: 12.0 cm. Mild hydronephrosis. 2 cm echogenic shadowing focus in the right upper pole may represent a stone but was not present on prior studies. Left Kidney: Length: 12.5 cm. Echogenicity within normal limits. No mass or  hydronephrosis visualized. Bladder: Foley catheter.  Bladder empty. Bilateral pleural effusions IMPRESSION: Mild right hydronephrosis. 2 cm echogenic shadowing focus right upper pole most likely a kidney stone which was not present on prior studies. Electronically Signed   By: Franchot Gallo M.D.   On: 10/24/2017 10:12     Scheduled Meds: .  atorvastatin  40 mg Oral q1800  . baclofen  5 mg Oral TID  . collagenase   Topical Daily  . feeding supplement (ENSURE ENLIVE)  237 mL Oral BID BM  . ferrous sulfate  325 mg Oral BID WC  . gabapentin  100 mg Oral TID  . heparin  5,000 Units Subcutaneous Q8H  . insulin aspart  0-9 Units Subcutaneous TID WC  . mouth rinse  15 mL Mouth Rinse BID  . multivitamin with minerals  1 tablet Oral Daily  . nicotine  7 mg Transdermal Daily  . pantoprazole  40 mg Oral Daily  . senna-docusate  1 tablet Oral BID   Continuous Infusions: . sodium chloride    . ampicillin-sulbactam (UNASYN) IV Stopped (10/24/17 4142)    Marzetta Board, MD, PhD [facility] Pager [phone]  If 7PM-7AM, please contact night-coverage [email or web address] Password [facility] 10/24/2017, 12:25 PM

## 2017-10-25 LAB — RENAL FUNCTION PANEL
ALBUMIN: 2.4 g/dL — AB (ref 3.5–5.0)
ANION GAP: 7 (ref 5–15)
BUN: <5 mg/dL — ABNORMAL LOW (ref 6–20)
CO2: 25 mmol/L (ref 22–32)
Calcium: 8.2 mg/dL — ABNORMAL LOW (ref 8.9–10.3)
Chloride: 108 mmol/L (ref 101–111)
Creatinine, Ser: 0.84 mg/dL (ref 0.61–1.24)
GFR calc Af Amer: >60 mL/min (ref >60)
GFR calc non Af Amer: >60 mL/min (ref >60)
Glucose, Bld: 99 mg/dL (ref 65–99)
Phosphorus: 3.7 mg/dL (ref 2.5–4.6)
Potassium: 3.4 mmol/L — ABNORMAL LOW (ref 3.5–5.1)
SODIUM: 140 mmol/L (ref 135–145)

## 2017-10-25 LAB — CBC WITH DIFFERENTIAL/PLATELET
BASOS PCT: 0 %
Basophils Absolute: 0 10*3/uL (ref 0.0–0.1)
EOS PCT: 2 %
Eosinophils Absolute: 0.3 10*3/uL (ref 0.0–0.7)
HEMATOCRIT: 25.7 % — AB (ref 39.0–52.0)
HEMOGLOBIN: 8.3 g/dL — AB (ref 13.0–17.0)
LYMPHS PCT: 24 %
Lymphs Abs: 4 10*3/uL (ref 0.7–4.0)
MCH: 28.9 pg (ref 26.0–34.0)
MCHC: 32.3 g/dL (ref 30.0–36.0)
MCV: 89.5 fL (ref 78.0–100.0)
MONOS PCT: 5 %
Monocytes Absolute: 0.8 10*3/uL (ref 0.1–1.0)
NEUTROS PCT: 69 %
Neutro Abs: 11.7 10*3/uL — ABNORMAL HIGH (ref 1.7–7.7)
PLATELETS: 434 10*3/uL — AB (ref 150–400)
RBC: 2.87 MIL/uL — ABNORMAL LOW (ref 4.22–5.81)
RDW: 15.2 % (ref 11.5–15.5)
WBC: 16.8 10*3/uL — ABNORMAL HIGH (ref 4.0–10.5)

## 2017-10-25 LAB — GLUCOSE, CAPILLARY
GLUCOSE-CAPILLARY: 121 mg/dL — AB (ref 65–99)
Glucose-Capillary: 102 mg/dL — ABNORMAL HIGH (ref 65–99)
Glucose-Capillary: 112 mg/dL — ABNORMAL HIGH (ref 65–99)
Glucose-Capillary: 90 mg/dL (ref 65–99)

## 2017-10-25 LAB — MAGNESIUM: Magnesium: 1.6 mg/dL — ABNORMAL LOW (ref 1.7–2.4)

## 2017-10-25 MED ORDER — AMOXICILLIN-POT CLAVULANATE 875-125 MG PO TABS: 1.0000 | ORAL_TABLET | Freq: Two times a day (BID) | ORAL | 0 refills | Status: DC

## 2017-10-25 NOTE — Progress Notes (Signed)
Patient will discharge to [facility]. Anticipated discharge date: 10/25/17 Family notified: patient preferred not to notify family Transportation by: [facility]  Nurse to call report to [phone].   CSW signing off.  Estanislado Emms, [place]  Clinical Social Worker

## 2017-10-25 NOTE — Discharge Summary (Signed)
Physician Discharge Summary  Phillip [name] ION:[id number] DOB: [birth year] DOA: 10/17/2017  PCP: Patient, No Pcp Per  Admit date: 10/17/2017 Discharge date: 10/25/2017  Admitted From: SNF Disposition:  SNF  Recommendations for Outpatient Follow-up:  1. Follow up with Dr. Alinda Money in 1-2 weeks 2. Continue Augmentin for 7 additional days  Home Health: none Equipment/Devices: none  Discharge Condition: stable CODE STATUS: Full code Diet recommendation: regular  HPI: Per Dr. Thayer Jew, This is a wheelchair-bound 51 year old who lives at [facility] center who has been having fevers for the past 3 days.  He has a suprapubic catheter in place and has had multiple UTIs in the past.  In addition he has decubitus ulcers.  Family is not aware of any cough or shortness of breath.  A urinalysis was submitted on 11/ 5.  That UA shows positive nitrite and leukocyte esterase.  He was transported to our emergency room due to altered mental status and hypotension.  He has received a total of 1.5 L of crystalloid and remains marginally hypotensive.  He was obtunded and has been orally intubated.    Hospital Course: Discharge Diagnoses:  Active Problems:   Septic shock (HCC)   Pressure injury of skin   Acute respiratory failure with hypoxia (HCC)   Septic shock -patient initially admitted to the ICU with sepsis shock. Sepsis physiology resolved, this is likely due to UTI, he is growing Enterococcus faecalis which is senzitive to penicillins. He was maintained on Unasyn, defervesced, was afebrile and his WBC started to improve. He was transitioned to Augmentin and will need 7 additional days on d/c Pressure injury of skin -Sacral ulcer looks like it is healing, no drainage, continue wound care Acute respiratory failure hypoxia -In the setting of severe sepsis and septic shock, resolved, currently on room air Paraplegia -supportive treatment, continue home medications HTN -resume home  medications HLD -continue statin Right nephrolithiasis -seen on [place], does not appear obstructive, he has normal renal function, excellent UOP, will need Urology outpatient follow up, he has seen Dr. Alinda Money in the past    Discharge Instructions   Allergies as of 10/25/2017   No Known Allergies     Medication List    STOP taking these medications   doxycycline 100 MG EC tablet Commonly known as:  DORYX     TAKE these medications   acetaminophen 325 MG tablet Commonly known as:  TYLENOL Take 650 mg by mouth 3 (three) times daily.   amitriptyline 25 MG tablet Commonly known as:  ELAVIL Take 25 mg by mouth at bedtime.   amoxicillin-clavulanate 875-125 MG tablet Commonly known as:  AUGMENTIN Take 1 tablet 2 (two) times daily by mouth.   ascorbic acid 500 MG tablet Commonly known as:  VITAMIN C Take 1 tablet (500 mg total) by mouth 2 (two) times daily.   atorvastatin 40 MG tablet Commonly known as:  LIPITOR Take 40 mg by mouth daily.   baclofen 20 MG tablet Commonly known as:  LIORESAL Take 20 mg by mouth 3 (three) times daily. Take with baclofen 10 mg to equal 30 mg What changed:  Another medication with the same name was changed. Make sure you understand how and when to take each.   baclofen 20 MG tablet Commonly known as:  LIORESAL Take 20 mg 3 (three) times daily by mouth. Take with 10mg  tablet to equal 30mg  total What changed:  Another medication with the same name was changed. Make sure you understand how and when to  take each.   baclofen 10 MG tablet Commonly known as:  LIORESAL Take 1 tablet (10 mg total) by mouth 3 (three) times daily. What changed:  additional instructions   bisacodyl 10 MG suppository Commonly known as:  DULCOLAX Place 10 mg rectally every 8 (eight) hours as needed for moderate constipation.   cloNIDine 0.1 MG tablet Commonly known as:  CATAPRES Take 0.1 mg by mouth 3 (three) times daily.   collagenase ointment Commonly known as:   SANTYL Apply 1 application every evening topically. Apply to left lateral foot   cyanocobalamin 1000 MCG tablet Take 1 tablet (1,000 mcg total) by mouth daily.   ferrous sulfate 325 (65 FE) MG tablet Take 1 tablet (325 mg total) by mouth 2 (two) times daily with a meal.   furosemide 20 MG tablet Commonly known as:  LASIX Take 1 tablet (20 mg total) by mouth daily. What changed:  how much to take   gabapentin 300 MG capsule Commonly known as:  NEURONTIN Take 300 mg by mouth 3 (three) times daily.   Melatonin 3 MG Tabs Take 3-6 mg by mouth at bedtime as needed (for insomnia).   metoprolol tartrate 25 MG tablet Commonly known as:  LOPRESSOR Take 0.5 tablets (12.5 mg total) by mouth 2 (two) times daily.   multivitamin with minerals Tabs tablet Take 1 tablet by mouth daily.   ondansetron 4 MG tablet Commonly known as:  ZOFRAN Take 1 tablet (4 mg total) by mouth every 6 (six) hours as needed for nausea.   opium-belladonna 16.2-60 MG suppository Commonly known as:  B&O SUPPRETTES Place 1 suppository rectally every 8 (eight) hours as needed for bladder spasms.   pantoprazole 40 MG tablet Commonly known as:  PROTONIX Take 1 tablet (40 mg total) by mouth daily.   potassium chloride 10 MEQ tablet Commonly known as:  K-DUR,KLOR-CON Take 10 mEq daily by mouth.   saccharomyces boulardii 250 MG capsule Commonly known as:  FLORASTOR Take 1 capsule (250 mg total) by mouth 2 (two) times daily.   solifenacin 5 MG tablet Commonly known as:  VESICARE Take 5 mg by mouth daily.   zinc sulfate 220 (50 Zn) MG capsule Take 1 capsule (220 mg total) by mouth daily. What changed:  when to take this      Follow-up Information    Raynelle Bring, MD. Schedule an appointment as soon as possible for a visit in 1 week(s).   Specialty:  Urology Contact information: [address] [place] [address] [phone]           Consultations:  PCCM  Procedures/Studies:  Ct Head Wo  Contrast  Result Date: 10/17/2017 CLINICAL DATA:  Altered mental status with fever x3 days. EXAM: CT HEAD WITHOUT CONTRAST TECHNIQUE: Contiguous axial images were obtained from the base of the skull through the vertex without intravenous contrast. COMPARISON:  05/31/2016 FINDINGS: Brain: No evidence of acute infarction, hemorrhage, hydrocephalus, extra-axial collection or mass lesion/mass effect. Vascular: No hyperdense vessel or unexpected calcification. Skull: Normal. Negative for fracture or focal lesion. Sinuses/Orbits: No acute finding. Other: None IMPRESSION: No acute intracranial abnormality. Electronically Signed   By: Ashley Royalty M.D.   On: 10/17/2017 17:45   US Renal  Result Date: 10/24/2017 CLINICAL DATA:  Leukocytosis EXAM: RENAL / URINARY TRACT ULTRASOUND COMPLETE COMPARISON:  Renal ultrasound 02/16/2016, CT 06/11/2015 FINDINGS: Right Kidney: Length: 12.0 cm. Mild hydronephrosis. 2 cm echogenic shadowing focus in the right upper pole may represent a stone but was not present  on prior studies. Left Kidney: Length: 12.5 cm. Echogenicity within normal limits. No mass or hydronephrosis visualized. Bladder: Foley catheter.  Bladder empty. Bilateral pleural effusions IMPRESSION: Mild right hydronephrosis. 2 cm echogenic shadowing focus right upper pole most likely a kidney stone which was not present on prior studies. Electronically Signed   By: Franchot Gallo M.D.   On: 10/24/2017 10:12   Dg Chest Port 1 View  Result Date: 10/18/2017 CLINICAL DATA:  51 year old male with a history of acute respiratory failure and hypoxia EXAM: PORTABLE CHEST 1 VIEW COMPARISON:  10/17/2017 FINDINGS: Cardiomediastinal silhouette unchanged in size and contour. Endotracheal tube unchanged, terminating approximately 3.4 cm above the carina. Unchanged position of gastric tube which terminates out of the field of view. Adequate aeration of bilateral lungs with no confluent airspace disease. No pneumothorax. Surgical  changes of the cervical region incompletely imaged. IMPRESSION: Similar appearance of the chest x-ray with low lung volumes, unchanged position of endotracheal tube and gastric tube. Electronically Signed   By: Corrie Mckusick D.O.   On: 10/18/2017 07:52   Dg Chest Portable 1 View  Result Date: 10/17/2017 CLINICAL DATA:  ETT and orogastric tube placement EXAM: PORTABLE CHEST 1 VIEW COMPARISON:  09/12/2017 FINDINGS: The tip of an endotracheal tube is seen approximately 2.5 cm above the carina. A gastric tube with side port is noted in the left upper quadrant of the abdomen, along the expected location the stomach. Heart is top-normal in size. Low volumes are slightly low without pneumonic consolidation, effusion or pneumothorax. Spinal fusion hardware is partially included at the base of the cervical spine. No acute osseous appearing abnormality. IMPRESSION: 1. Endotracheal tube tip approximately 2.5 cm above the carina with gastric tube and side-port noted in the expected location of the stomach. 2. Shallow inspiration without pneumonic consolidation or pneumothorax. No effusion or CHF. Electronically Signed   By: Ashley Royalty M.D.   On: 10/17/2017 15:32      Subjective: -feels well, asks to go home   Discharge Exam: Vitals:   10/24/17 2159 10/25/17 0505  BP: (!) 156/96 (!) 151/90  Pulse: 92 93  Resp: 18 18  Temp: 99.2 F (37.3 C) 98.4 F (36.9 C)  SpO2: 97% 96%    General: Pt is alert, awake, not in acute distress Cardiovascular: RRR, S1/S2 +, no rubs, no gallops Respiratory: CTA bilaterally, no wheezing, no rhonchi Abdominal: Soft, NT, ND, bowel sounds +   The results of significant diagnostics from this hospitalization (including imaging, microbiology, ancillary and laboratory) are listed below for reference.     Microbiology: Recent Results (from the past 240 hour(s))  Blood Culture (routine x 2)     Status: None   Collection Time: 10/17/17  4:29 PM  Result Value Ref Range  Status   Specimen Description BLOOD RIGHT HAND  Final   Special Requests   Final    BOTTLES DRAWN AEROBIC AND ANAEROBIC Blood Culture adequate volume   Culture NO GROWTH 5 DAYS  Final   Report Status 10/22/2017 FINAL  Final  Urine culture     Status: Abnormal   Collection Time: 10/17/17  4:29 PM  Result Value Ref Range Status   Specimen Description URINE, RANDOM  Final   Special Requests NONE  Final   Culture 70,000 COLONIES/mL ENTEROCOCCUS FAECALIS (A)  Final   Report Status 10/19/2017 FINAL  Final   Organism ID, Bacteria ENTEROCOCCUS FAECALIS (A)  Final      Susceptibility   Enterococcus faecalis - MIC*  AMPICILLIN <=2 SENSITIVE Sensitive     LEVOFLOXACIN >=8 RESISTANT Resistant     NITROFURANTOIN <=16 SENSITIVE Sensitive     VANCOMYCIN 1 SENSITIVE Sensitive     * 70,000 COLONIES/mL ENTEROCOCCUS FAECALIS  Blood Culture (routine x 2)     Status: None   Collection Time: 10/17/17  4:34 PM  Result Value Ref Range Status   Specimen Description BLOOD LEFT ANTECUBITAL  Final   Special Requests   Final    BOTTLES DRAWN AEROBIC AND ANAEROBIC Blood Culture adequate volume   Culture NO GROWTH 5 DAYS  Final   Report Status 10/22/2017 FINAL  Final  MRSA PCR Screening     Status: Abnormal   Collection Time: 10/17/17  7:05 PM  Result Value Ref Range Status   MRSA by PCR INVALID RESULTS, SPECIMEN SENT FOR CULTURE (A) NEGATIVE Final    Comment: Results Called to: P. TRIVETTE RN, AT 0530 10/18/17 BY D. VANHOOK        The GeneXpert MRSA Assay (FDA approved for NASAL specimens only), is one component of a comprehensive MRSA colonization surveillance program. It is not intended to diagnose MRSA infection nor to guide or monitor treatment for MRSA infections.   MRSA culture     Status: None   Collection Time: 10/17/17  7:05 PM  Result Value Ref Range Status   Specimen Description NASAL SWAB  Final   Special Requests NONE  Final   Culture NO MRSA DETECTED  Final   Report Status  10/19/2017 FINAL  Final  Culture, blood (routine x 2)     Status: None   Collection Time: 10/17/17  8:23 PM  Result Value Ref Range Status   Specimen Description BLOOD RIGHT HAND  Final   Special Requests   Final    BOTTLES DRAWN AEROBIC AND ANAEROBIC Blood Culture adequate volume   Culture NO GROWTH 5 DAYS  Final   Report Status 10/22/2017 FINAL  Final  Culture, blood (routine x 2)     Status: None   Collection Time: 10/17/17  8:31 PM  Result Value Ref Range Status   Specimen Description BLOOD LEFT HAND  Final   Special Requests   Final    BOTTLES DRAWN AEROBIC ONLY Blood Culture adequate volume   Culture NO GROWTH 5 DAYS  Final   Report Status 10/22/2017 FINAL  Final  Culture, blood (routine x 2)     Status: None   Collection Time: 10/19/17  7:00 PM  Result Value Ref Range Status   Specimen Description BLOOD BLOOD RIGHT HAND  Final   Special Requests IN PEDIATRIC BOTTLE Blood Culture adequate volume  Final   Culture NO GROWTH 5 DAYS  Final   Report Status 10/24/2017 FINAL  Final  Culture, blood (routine x 2)     Status: None   Collection Time: 10/19/17  7:15 PM  Result Value Ref Range Status   Specimen Description BLOOD RIGHT ANTECUBITAL  Final   Special Requests IN PEDIATRIC BOTTLE Blood Culture adequate volume  Final   Culture NO GROWTH 5 DAYS  Final   Report Status 10/24/2017 FINAL  Final     Labs: BNP (last 3 results) Recent Labs    09/13/17 0408  BNP 62.7   Basic Metabolic Panel: Recent Labs  Lab 10/21/17 0423 10/22/17 0452 10/23/17 0703 10/24/17 0534 10/25/17 0424  NA 137 136 138 140 140  K 3.5 3.4* 3.2* 2.9* 3.4*  CL 107 107 106 107 108  CO2 23 21* 23 24 25  GLUCOSE 86 93 103* 112* 99  BUN 9 6 5* <5* <5*  CREATININE 1.15 1.22 0.92 0.83 0.84  CALCIUM 7.5* 7.5* 8.0* 7.9* 8.2*  MG 1.8 1.8 1.7 1.6* 1.6*  PHOS 3.1 2.7 2.8 3.3 3.7   Liver Function Tests: Recent Labs  Lab 10/21/17 0423 10/22/17 0452 10/23/17 0703 10/24/17 0534 10/25/17 0424    ALBUMIN 1.9* 1.9* 2.2* 2.3* 2.4*   No results for input(s): LIPASE, AMYLASE in the last 168 hours. No results for input(s): AMMONIA in the last 168 hours. CBC: Recent Labs  Lab 10/21/17 0423 10/22/17 0452 10/23/17 0703 10/24/17 0534 10/25/17 0424  WBC 14.5* 19.7* 19.9* 22.7* 16.8*  NEUTROABS 10.0* 17.3* 14.7* 17.7* 11.7*  HGB 7.4* 7.8* 8.0* 8.5* 8.3*  HCT 22.6* 23.6* 24.4* 25.6* 25.7*  MCV 89.3 89.4 88.4 88.6 89.5  PLT 268 309 333 377 434*   Cardiac Enzymes: No results for input(s): CKTOTAL, CKMB, CKMBINDEX, TROPONINI in the last 168 hours. BNP: Invalid input(s): POCBNP CBG: Recent Labs  Lab 10/24/17 1622 10/24/17 2013 10/25/17 0027 10/25/17 0646 10/25/17 0748  GLUCAP 95 91 90 121* 112*   D-Dimer No results for input(s): DDIMER in the last 72 hours. Hgb A1c No results for input(s): HGBA1C in the last 72 hours. Lipid Profile No results for input(s): CHOL, HDL, LDLCALC, TRIG, CHOLHDL, LDLDIRECT in the last 72 hours. Thyroid function studies No results for input(s): TSH, T4TOTAL, T3FREE, THYROIDAB in the last 72 hours.  Invalid input(s): FREET3 Anemia work up No results for input(s): VITAMINB12, FOLATE, FERRITIN, TIBC, IRON, RETICCTPCT in the last 72 hours. Urinalysis    Component Value Date/Time   COLORURINE AMBER (A) 10/17/2017 1629   APPEARANCEUR CLOUDY (A) 10/17/2017 1629   LABSPEC 1.021 10/17/2017 1629   PHURINE 5.0 10/17/2017 1629   GLUCOSEU NEGATIVE 10/17/2017 1629   HGBUR LARGE (A) 10/17/2017 1629   BILIRUBINUR NEGATIVE 10/17/2017 1629   KETONESUR NEGATIVE 10/17/2017 1629   PROTEINUR 100 (A) 10/17/2017 1629   UROBILINOGEN 0.2 08/23/2015 0247   NITRITE NEGATIVE 10/17/2017 1629   LEUKOCYTESUR LARGE (A) 10/17/2017 1629   Sepsis Labs Invalid input(s): PROCALCITONIN,  WBC,  LACTICIDVEN   Time coordinating discharge: 35 minutes  SIGNED:  Marzetta Board, MD  [facility] 10/25/2017, 9:42 AM Pager [phone]  If 7PM-7AM, please contact  night-coverage [email or web address] Password [email or web address]

## 2017-10-25 NOTE — Clinical Social Work Placement (Signed)
   CLINICAL SOCIAL WORK PLACEMENT  NOTE  Date:  10/25/2017  Patient Details  Name: Phillip [name] MRN: [id number] Date of Birth: 1966/01/30  Clinical Social Work is seeking post-discharge placement for this patient at the [place] level of care (*CSW will initial, date and re-position this form in  chart as items are completed):  Yes   Patient/family provided with [place] Work Department's list of facilities offering this level of care within the geographic area requested by the patient (or if unable, by the patient's family).  Yes   Patient/family informed of their freedom to choose among providers that offer the needed level of care, that participate in Medicare, Medicaid or managed care program needed by the patient, have an available bed and are willing to accept the patient.  Yes   Patient/family informed of [facility]'s ownership interest in [facility] and [facility], as well as of the fact that they are under no obligation to receive care at these facilities.  PASRR submitted to EDS on       PASRR number received on       Existing PASRR number confirmed on 10/21/17     FL2 transmitted to all facilities in geographic area requested by pt/family on 10/21/17     FL2 transmitted to all facilities within larger geographic area on       Patient informed that his/her managed care company has contracts with or will negotiate with certain facilities, including the following:  [facility]     Yes   Patient/family informed of bed offers received.  Patient chooses bed at [facility]     Physician recommends and patient chooses bed at      [facility]  Patient to be transferred to facility by [facility]     Patient family notified on 10/25/17 of transfer.  Name of family member notified:  patient prefers not to notify family     PHYSICIAN       Additional  Comment:    _______________________________________________ Estanislado Emms, LCSW 10/25/2017, 10:11 AM

## 2017-10-25 NOTE — Progress Notes (Signed)
Report called to [facility] LPN at [facility] [phone], scripts faxed.

## 2017-10-25 NOTE — Discharge Instructions (Signed)
Follow with Dr. Alinda Money in 1-2 weeks  Please get a complete blood count and chemistry panel checked by your Primary MD at your next visit, and again as instructed by your Primary MD. Please get your medications reviewed and adjusted by your Primary MD.  Please request your Primary MD to go over all Hospital Tests and Procedure/Radiological results at the follow up, please get all Hospital records sent to your Prim MD by signing hospital release before you go home.  If you had Pneumonia of Lung problems at the Hospital: Please get a 2 view Chest X ray done in 6-8 weeks after hospital discharge or sooner if instructed by your Primary MD.  If you have Congestive Heart Failure: Please call your Cardiologist or Primary MD anytime you have any of the following symptoms:  1) 3 pound weight gain in 24 hours or 5 pounds in 1 week  2) shortness of breath, with or without a dry hacking cough  3) swelling in the hands, feet or stomach  4) if you have to sleep on extra pillows at night in order to breathe  Follow cardiac low salt diet and 1.5 lit/day fluid restriction.  If you have diabetes Accuchecks 4 times/day, Once in AM empty stomach and then before each meal. Log in all results and show them to your primary doctor at your next visit. If any glucose reading is under 80 or above 300 call your primary MD immediately.  If you have Seizure/Convulsions/Epilepsy: Please do not drive, operate heavy machinery, participate in activities at heights or participate in high speed sports until you have seen by Primary MD or a Neurologist and advised to do so again.  If you had Gastrointestinal Bleeding: Please ask your Primary MD to check a complete blood count within one week of discharge or at your next visit. Your endoscopic/colonoscopic biopsies that are pending at the time of discharge, will also need to followed by your Primary MD.  Get Medicines reviewed and adjusted. Please take all your medications  with you for your next visit with your Primary MD  Please request your Primary MD to go over all hospital tests and procedure/radiological results at the follow up, please ask your Primary MD to get all Hospital records sent to his/her office.  If you experience worsening of your admission symptoms, develop shortness of breath, life threatening emergency, suicidal or homicidal thoughts you must seek medical attention immediately by calling 911 or calling your MD immediately  if symptoms less severe.  You must read complete instructions/literature along with all the possible adverse reactions/side effects for all the Medicines you take and that have been prescribed to you. Take any new Medicines after you have completely understood and accpet all the possible adverse reactions/side effects.   Do not drive or operate heavy machinery when taking Pain medications.   Do not take more than prescribed Pain, Sleep and Anxiety Medications  Special Instructions: If you have smoked or chewed Tobacco  in the last 2 yrs please stop smoking, stop any regular Alcohol  and or any Recreational drug use.  Wear Seat belts while driving.  Please note You were cared for by a hospitalist during your hospital stay. If you have any questions about your discharge medications or the care you received while you were in the hospital after you are discharged, you can call the unit and asked to speak with the hospitalist on call if the hospitalist that took care of you is not available. Once you  are discharged, your primary care physician will handle any further medical issues. Please note that NO REFILLS for any discharge medications will be authorized once you are discharged, as it is imperative that you return to your primary care physician (or establish a relationship with a primary care physician if you do not have one) for your aftercare needs so that they can reassess your need for medications and monitor your lab  values.  You can reach the hospitalist office at phone [phone] or fax [phone]   If you do not have a primary care physician, you can call [phone] for a physician referral.  Activity: As tolerated with Full fall precautions use walker/cane & assistance as needed  Diet: regular  Disposition Home

## 2017-10-25 NOTE — Progress Notes (Signed)
Phillip [name] to be D/C'd [facility] per MD order.  Discussed with the patient and all questions fully answered.  VSS, Skin clean, dry and intact without evidence of skin break down, no evidence of skin tears noted. IV catheter discontinued intact. Site without signs and symptoms of complications. Dressing and pressure applied.  An After Visit Summary was sent with the patient.      Patient escorted via [facility].   Sharion Balloon 10/25/2017 1:11 PM

## 2017-10-25 NOTE — Plan of Care (Signed)
Patient progressing 

## 2017-11-08 ENCOUNTER — Inpatient Hospital Stay (HOSPITAL_COMMUNITY)
Admission: EM | Admit: 2017-11-08 | Discharge: 2017-11-14 | DRG: 853 | Disposition: A | Discharge status: Skilled Nursing Facility | Payer: Medicare Other | Attending: Internal Medicine | Admitting: Internal Medicine

## 2017-11-08 ENCOUNTER — Other Ambulatory Visit: Payer: Self-pay

## 2017-11-08 ENCOUNTER — Encounter (HOSPITAL_COMMUNITY): Payer: Self-pay | Admitting: Emergency Medicine

## 2017-11-08 ENCOUNTER — Emergency Department (HOSPITAL_COMMUNITY): Payer: Medicare Other

## 2017-11-08 DIAGNOSIS — N12 Tubulo-interstitial nephritis, not specified as acute or chronic: Secondary | ICD-10-CM | POA: Diagnosis present

## 2017-11-08 DIAGNOSIS — M1990 Unspecified osteoarthritis, unspecified site: Secondary | ICD-10-CM | POA: Diagnosis present

## 2017-11-08 DIAGNOSIS — R6520 Severe sepsis without septic shock: Secondary | ICD-10-CM | POA: Diagnosis present

## 2017-11-08 DIAGNOSIS — R402332 Coma scale, best motor response, abnormal, at arrival to emergency department: Secondary | ICD-10-CM | POA: Diagnosis present

## 2017-11-08 DIAGNOSIS — B965 Pseudomonas (aeruginosa) (mallei) (pseudomallei) as the cause of diseases classified elsewhere: Secondary | ICD-10-CM | POA: Diagnosis present

## 2017-11-08 DIAGNOSIS — E872 Acidosis: Secondary | ICD-10-CM | POA: Diagnosis present

## 2017-11-08 DIAGNOSIS — A498 Other bacterial infections of unspecified site: Secondary | ICD-10-CM | POA: Diagnosis present

## 2017-11-08 DIAGNOSIS — R402432 Glasgow coma scale score 3-8, at arrival to emergency department: Secondary | ICD-10-CM

## 2017-11-08 DIAGNOSIS — R402122 Coma scale, eyes open, to pain, at arrival to emergency department: Secondary | ICD-10-CM | POA: Diagnosis present

## 2017-11-08 DIAGNOSIS — G9341 Metabolic encephalopathy: Secondary | ICD-10-CM | POA: Diagnosis present

## 2017-11-08 DIAGNOSIS — N319 Neuromuscular dysfunction of bladder, unspecified: Secondary | ICD-10-CM | POA: Diagnosis present

## 2017-11-08 DIAGNOSIS — A4159 Other Gram-negative sepsis: Secondary | ICD-10-CM | POA: Diagnosis not present

## 2017-11-08 DIAGNOSIS — S14105S Unspecified injury at C5 level of cervical spinal cord, sequela: Secondary | ICD-10-CM

## 2017-11-08 DIAGNOSIS — A419 Sepsis, unspecified organism: Secondary | ICD-10-CM

## 2017-11-08 DIAGNOSIS — I110 Hypertensive heart disease with heart failure: Secondary | ICD-10-CM | POA: Diagnosis present

## 2017-11-08 DIAGNOSIS — J9600 Acute respiratory failure, unspecified whether with hypoxia or hypercapnia: Secondary | ICD-10-CM | POA: Diagnosis present

## 2017-11-08 DIAGNOSIS — G8250 Quadriplegia, unspecified: Secondary | ICD-10-CM | POA: Diagnosis present

## 2017-11-08 DIAGNOSIS — I5032 Chronic diastolic (congestive) heart failure: Secondary | ICD-10-CM | POA: Diagnosis present

## 2017-11-08 DIAGNOSIS — N200 Calculus of kidney: Secondary | ICD-10-CM

## 2017-11-08 DIAGNOSIS — R739 Hyperglycemia, unspecified: Secondary | ICD-10-CM | POA: Diagnosis not present

## 2017-11-08 DIAGNOSIS — J9690 Respiratory failure, unspecified, unspecified whether with hypoxia or hypercapnia: Secondary | ICD-10-CM

## 2017-11-08 DIAGNOSIS — Z8249 Family history of ischemic heart disease and other diseases of the circulatory system: Secondary | ICD-10-CM

## 2017-11-08 DIAGNOSIS — L89893 Pressure ulcer of other site, stage 3: Secondary | ICD-10-CM | POA: Diagnosis present

## 2017-11-08 DIAGNOSIS — R7881 Bacteremia: Secondary | ICD-10-CM | POA: Diagnosis present

## 2017-11-08 DIAGNOSIS — Z87440 Personal history of urinary (tract) infections: Secondary | ICD-10-CM

## 2017-11-08 DIAGNOSIS — F17210 Nicotine dependence, cigarettes, uncomplicated: Secondary | ICD-10-CM | POA: Diagnosis present

## 2017-11-08 DIAGNOSIS — N1330 Unspecified hydronephrosis: Secondary | ICD-10-CM

## 2017-11-08 DIAGNOSIS — D638 Anemia in other chronic diseases classified elsewhere: Secondary | ICD-10-CM | POA: Diagnosis present

## 2017-11-08 DIAGNOSIS — J9601 Acute respiratory failure with hypoxia: Secondary | ICD-10-CM | POA: Diagnosis present

## 2017-11-08 DIAGNOSIS — E785 Hyperlipidemia, unspecified: Secondary | ICD-10-CM | POA: Diagnosis present

## 2017-11-08 DIAGNOSIS — X58XXXS Exposure to other specified factors, sequela: Secondary | ICD-10-CM | POA: Diagnosis present

## 2017-11-08 DIAGNOSIS — R569 Unspecified convulsions: Secondary | ICD-10-CM | POA: Diagnosis present

## 2017-11-08 DIAGNOSIS — R402212 Coma scale, best verbal response, none, at arrival to emergency department: Secondary | ICD-10-CM | POA: Diagnosis present

## 2017-11-08 DIAGNOSIS — L8990 Pressure ulcer of unspecified site, unspecified stage: Secondary | ICD-10-CM | POA: Diagnosis present

## 2017-11-08 DIAGNOSIS — N136 Pyonephrosis: Secondary | ICD-10-CM | POA: Diagnosis present

## 2017-11-08 DIAGNOSIS — Z4659 Encounter for fitting and adjustment of other gastrointestinal appliance and device: Secondary | ICD-10-CM

## 2017-11-08 DIAGNOSIS — N179 Acute kidney failure, unspecified: Secondary | ICD-10-CM | POA: Diagnosis present

## 2017-11-08 LAB — COMPREHENSIVE METABOLIC PANEL
ALBUMIN: 3.6 g/dL (ref 3.5–5.0)
ALK PHOS: 135 U/L — AB (ref 38–126)
ALT: 41 U/L (ref 17–63)
AST: 28 U/L (ref 15–41)
Anion gap: 10 (ref 5–15)
BILIRUBIN TOTAL: 1.4 mg/dL — AB (ref 0.3–1.2)
BUN: 25 mg/dL — ABNORMAL HIGH (ref 6–20)
CALCIUM: 9.1 mg/dL (ref 8.9–10.3)
CO2: 25 mmol/L (ref 22–32)
CREATININE: 1.85 mg/dL — AB (ref 0.61–1.24)
Chloride: 106 mmol/L (ref 101–111)
GFR calc Af Amer: 48 mL/min — ABNORMAL LOW (ref >60)
GFR calc non Af Amer: 41 mL/min — ABNORMAL LOW (ref >60)
GLUCOSE: 131 mg/dL — AB (ref 65–99)
Potassium: 4.7 mmol/L (ref 3.5–5.1)
SODIUM: 141 mmol/L (ref 135–145)
Total Protein: 8.1 g/dL (ref 6.5–8.1)

## 2017-11-08 LAB — CBC WITH DIFFERENTIAL/PLATELET
BASOS PCT: 0 %
Basophils Absolute: 0 10*3/uL (ref 0.0–0.1)
EOS PCT: 0 %
Eosinophils Absolute: 0 10*3/uL (ref 0.0–0.7)
HCT: 25.8 % — ABNORMAL LOW (ref 39.0–52.0)
Hemoglobin: 8.3 g/dL — ABNORMAL LOW (ref 13.0–17.0)
LYMPHS ABS: 3.3 10*3/uL (ref 0.7–4.0)
Lymphocytes Relative: 12 %
MCH: 29.7 pg (ref 26.0–34.0)
MCHC: 32.2 g/dL (ref 30.0–36.0)
MCV: 92.5 fL (ref 78.0–100.0)
MONO ABS: 3.3 10*3/uL — AB (ref 0.1–1.0)
Monocytes Relative: 12 %
Neutro Abs: 21.1 10*3/uL — ABNORMAL HIGH (ref 1.7–7.7)
Neutrophils Relative %: 76 %
PLATELETS: 405 10*3/uL — AB (ref 150–400)
RBC: 2.79 MIL/uL — ABNORMAL LOW (ref 4.22–5.81)
RDW: 15.4 % (ref 11.5–15.5)
WBC: 27.7 10*3/uL — ABNORMAL HIGH (ref 4.0–10.5)

## 2017-11-08 LAB — PROTIME-INR
INR: 1.73
Prothrombin Time: 20.1 seconds — ABNORMAL HIGH (ref 11.4–15.2)

## 2017-11-08 LAB — CBG MONITORING, ED: Glucose-Capillary: 130 mg/dL — ABNORMAL HIGH (ref 65–99)

## 2017-11-08 LAB — I-STAT CG4 LACTIC ACID, ED: Lactic Acid, Venous: 1.71 mmol/L (ref 0.5–1.9)

## 2017-11-08 MED ORDER — SODIUM CHLORIDE 0.9 % IV BOLUS (SEPSIS)
1000.0000 mL | Freq: Once | INTRAVENOUS | Status: AC
  Administered 2017-11-08: 1000 mL via INTRAVENOUS

## 2017-11-08 MED ORDER — DEXTROSE 5 % IV SOLN
2.0000 g | Freq: Once | INTRAVENOUS | Status: AC
  Administered 2017-11-09: 2 g via INTRAVENOUS
  Filled 2017-11-08: qty 2

## 2017-11-08 NOTE — ED Triage Notes (Signed)
Pt brought in by EMS from [place] had a fever earlier today around 3pm of 101 and around 9pm it was 102 and at that time pt was no longer alert  Pt is diaphoretic  B/P was in the 80s   EMS arrived pt was extremely diaphoretic  Pt has hx of UTIs  Urine in bag is very cloudy  Pt is a paraplegic  Upon arrival pt is responsive to painful stimuli   Pt received tylenol at the facility 1000mg  suppository
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
## 2017-11-08 NOTE — ED Notes (Signed)
Patient extremely diaphoretic on arrival. Pupils pinpoint and unresponsive.

## 2017-11-08 NOTE — ED Notes (Signed)
Patient transported to X-ray 

## 2017-11-08 NOTE — Progress Notes (Signed)
A consult was received from an ED physician for cefepime per pharmacy dosing.  The patient's profile has been reviewed for ht/wt/allergies/indication/available labs.   A one time order has been placed for Cefepime 2 Gm.  Further antibiotics/pharmacy consults should be ordered by admitting physician if indicated.                       Thank you, Dorrene German 11/08/2017  11:17 PM

## 2017-11-09 ENCOUNTER — Inpatient Hospital Stay (HOSPITAL_COMMUNITY): Payer: Medicare Other

## 2017-11-09 ENCOUNTER — Other Ambulatory Visit: Payer: Self-pay

## 2017-11-09 ENCOUNTER — Emergency Department (HOSPITAL_COMMUNITY): Payer: Medicare Other

## 2017-11-09 DIAGNOSIS — E872 Acidosis: Secondary | ICD-10-CM | POA: Diagnosis present

## 2017-11-09 DIAGNOSIS — N136 Pyonephrosis: Secondary | ICD-10-CM | POA: Diagnosis present

## 2017-11-09 DIAGNOSIS — A419 Sepsis, unspecified organism: Secondary | ICD-10-CM | POA: Diagnosis present

## 2017-11-09 DIAGNOSIS — R402332 Coma scale, best motor response, abnormal, at arrival to emergency department: Secondary | ICD-10-CM | POA: Diagnosis present

## 2017-11-09 DIAGNOSIS — Z8249 Family history of ischemic heart disease and other diseases of the circulatory system: Secondary | ICD-10-CM | POA: Diagnosis not present

## 2017-11-09 DIAGNOSIS — L8940 Pressure ulcer of contiguous site of back, buttock and hip, unspecified stage: Secondary | ICD-10-CM | POA: Diagnosis not present

## 2017-11-09 DIAGNOSIS — Z87440 Personal history of urinary (tract) infections: Secondary | ICD-10-CM | POA: Diagnosis not present

## 2017-11-09 DIAGNOSIS — I110 Hypertensive heart disease with heart failure: Secondary | ICD-10-CM | POA: Diagnosis present

## 2017-11-09 DIAGNOSIS — N179 Acute kidney failure, unspecified: Secondary | ICD-10-CM | POA: Diagnosis present

## 2017-11-09 DIAGNOSIS — R402212 Coma scale, best verbal response, none, at arrival to emergency department: Secondary | ICD-10-CM | POA: Diagnosis present

## 2017-11-09 DIAGNOSIS — E785 Hyperlipidemia, unspecified: Secondary | ICD-10-CM | POA: Diagnosis present

## 2017-11-09 DIAGNOSIS — B965 Pseudomonas (aeruginosa) (mallei) (pseudomallei) as the cause of diseases classified elsewhere: Secondary | ICD-10-CM | POA: Diagnosis present

## 2017-11-09 DIAGNOSIS — R569 Unspecified convulsions: Secondary | ICD-10-CM | POA: Diagnosis present

## 2017-11-09 DIAGNOSIS — N200 Calculus of kidney: Secondary | ICD-10-CM | POA: Diagnosis not present

## 2017-11-09 DIAGNOSIS — G8250 Quadriplegia, unspecified: Secondary | ICD-10-CM | POA: Diagnosis present

## 2017-11-09 DIAGNOSIS — I5032 Chronic diastolic (congestive) heart failure: Secondary | ICD-10-CM | POA: Diagnosis present

## 2017-11-09 DIAGNOSIS — R7881 Bacteremia: Secondary | ICD-10-CM | POA: Diagnosis not present

## 2017-11-09 DIAGNOSIS — F17210 Nicotine dependence, cigarettes, uncomplicated: Secondary | ICD-10-CM | POA: Diagnosis present

## 2017-11-09 DIAGNOSIS — J9600 Acute respiratory failure, unspecified whether with hypoxia or hypercapnia: Secondary | ICD-10-CM | POA: Diagnosis present

## 2017-11-09 DIAGNOSIS — N319 Neuromuscular dysfunction of bladder, unspecified: Secondary | ICD-10-CM | POA: Diagnosis present

## 2017-11-09 DIAGNOSIS — N1330 Unspecified hydronephrosis: Secondary | ICD-10-CM | POA: Diagnosis not present

## 2017-11-09 DIAGNOSIS — G8220 Paraplegia, unspecified: Secondary | ICD-10-CM | POA: Diagnosis not present

## 2017-11-09 DIAGNOSIS — J9601 Acute respiratory failure with hypoxia: Secondary | ICD-10-CM | POA: Diagnosis present

## 2017-11-09 DIAGNOSIS — A4159 Other Gram-negative sepsis: Secondary | ICD-10-CM | POA: Diagnosis present

## 2017-11-09 DIAGNOSIS — G9341 Metabolic encephalopathy: Secondary | ICD-10-CM | POA: Diagnosis present

## 2017-11-09 DIAGNOSIS — N132 Hydronephrosis with renal and ureteral calculous obstruction: Secondary | ICD-10-CM | POA: Diagnosis not present

## 2017-11-09 DIAGNOSIS — N12 Tubulo-interstitial nephritis, not specified as acute or chronic: Secondary | ICD-10-CM | POA: Diagnosis not present

## 2017-11-09 DIAGNOSIS — X58XXXS Exposure to other specified factors, sequela: Secondary | ICD-10-CM | POA: Diagnosis present

## 2017-11-09 DIAGNOSIS — L89893 Pressure ulcer of other site, stage 3: Secondary | ICD-10-CM | POA: Diagnosis present

## 2017-11-09 DIAGNOSIS — R6520 Severe sepsis without septic shock: Secondary | ICD-10-CM | POA: Diagnosis present

## 2017-11-09 DIAGNOSIS — S14105S Unspecified injury at C5 level of cervical spinal cord, sequela: Secondary | ICD-10-CM | POA: Diagnosis not present

## 2017-11-09 DIAGNOSIS — M1990 Unspecified osteoarthritis, unspecified site: Secondary | ICD-10-CM | POA: Diagnosis present

## 2017-11-09 DIAGNOSIS — Z9359 Other cystostomy status: Secondary | ICD-10-CM | POA: Diagnosis not present

## 2017-11-09 DIAGNOSIS — R402122 Coma scale, eyes open, to pain, at arrival to emergency department: Secondary | ICD-10-CM | POA: Diagnosis present

## 2017-11-09 LAB — CBC
HCT: 32.2 % — ABNORMAL LOW (ref 39.0–52.0)
HEMOGLOBIN: 10.2 g/dL — AB (ref 13.0–17.0)
MCH: 29.7 pg (ref 26.0–34.0)
MCHC: 31.7 g/dL (ref 30.0–36.0)
MCV: 93.6 fL (ref 78.0–100.0)
PLATELETS: 283 10*3/uL (ref 150–400)
RBC: 3.44 MIL/uL — ABNORMAL LOW (ref 4.22–5.81)
RDW: 15.4 % (ref 11.5–15.5)
WBC: 18.3 10*3/uL — ABNORMAL HIGH (ref 4.0–10.5)

## 2017-11-09 LAB — BLOOD GAS, ARTERIAL
ACID-BASE DEFICIT: 3.8 mmol/L — AB (ref 0.0–2.0)
Bicarbonate: 20.9 mmol/L (ref 20.0–28.0)
DRAWN BY: 11249
FIO2: 100
MECHVT: 600 mL
O2 Saturation: 93.2 %
PEEP: 5 cmH2O
PO2 ART: 80.6 mmHg — AB (ref 83.0–108.0)
Patient temperature: 100
RATE: 16 resp/min
pCO2 arterial: 40 mmHg (ref 32.0–48.0)
pH, Arterial: 7.342 — ABNORMAL LOW (ref 7.350–7.450)

## 2017-11-09 LAB — GLUCOSE, CAPILLARY
GLUCOSE-CAPILLARY: 125 mg/dL — AB (ref 65–99)
GLUCOSE-CAPILLARY: 104 mg/dL — AB (ref 65–99)
Glucose-Capillary: 138 mg/dL — ABNORMAL HIGH (ref 65–99)
Glucose-Capillary: 204 mg/dL — ABNORMAL HIGH (ref 65–99)

## 2017-11-09 LAB — URINALYSIS, ROUTINE W REFLEX MICROSCOPIC
BILIRUBIN URINE: NEGATIVE
Glucose, UA: NEGATIVE mg/dL
Ketones, ur: NEGATIVE mg/dL
Nitrite: NEGATIVE
PH: 5 (ref 5.0–8.0)
Protein, ur: NEGATIVE mg/dL
SPECIFIC GRAVITY, URINE: 1.012 (ref 1.005–1.030)
SQUAMOUS EPITHELIAL / LPF: NONE SEEN

## 2017-11-09 LAB — LACTIC ACID, PLASMA
LACTIC ACID, VENOUS: 1.6 mmol/L (ref 0.5–1.9)
Lactic Acid, Venous: 2.2 mmol/L (ref 0.5–1.9)

## 2017-11-09 LAB — BASIC METABOLIC PANEL
ANION GAP: 10 (ref 5–15)
BUN: 20 mg/dL (ref 6–20)
CALCIUM: 8.2 mg/dL — AB (ref 8.9–10.3)
CO2: 20 mmol/L — ABNORMAL LOW (ref 22–32)
CREATININE: 1.37 mg/dL — AB (ref 0.61–1.24)
Chloride: 110 mmol/L (ref 101–111)
GFR calc Af Amer: >60 mL/min (ref >60)
GFR, EST NON AFRICAN AMERICAN: 60 mL/min — AB (ref >60)
GLUCOSE: 139 mg/dL — AB (ref 65–99)
Potassium: 4.3 mmol/L (ref 3.5–5.1)
Sodium: 140 mmol/L (ref 135–145)

## 2017-11-09 LAB — MAGNESIUM
MAGNESIUM: 1.4 mg/dL — AB (ref 1.7–2.4)
MAGNESIUM: 2.5 mg/dL — AB (ref 1.7–2.4)
MAGNESIUM: 2.3 mg/dL (ref 1.7–2.4)

## 2017-11-09 LAB — MRSA PCR SCREENING: MRSA by PCR: NEGATIVE

## 2017-11-09 LAB — PHOSPHORUS
PHOSPHORUS: 3.3 mg/dL (ref 2.5–4.6)
Phosphorus: 2.3 mg/dL — ABNORMAL LOW (ref 2.5–4.6)
Phosphorus: 2.1 mg/dL — ABNORMAL LOW (ref 2.5–4.6)

## 2017-11-09 LAB — TRIGLYCERIDES: TRIGLYCERIDES: 135 mg/dL (ref <150)

## 2017-11-09 LAB — PROCALCITONIN: PROCALCITONIN: 10.58 ng/mL

## 2017-11-09 MED ORDER — BACLOFEN 10 MG PO TABS: 10.0000 mg | ORAL_TABLET | Freq: Three times a day (TID) | ORAL | Status: DC

## 2017-11-09 MED ORDER — PIPERACILLIN-TAZOBACTAM 3.375 G IVPB
3.3750 g | Freq: Three times a day (TID) | INTRAVENOUS | Status: DC
  Administered 2017-11-09 – 2017-11-12 (×11): 3.375 g via INTRAVENOUS
  Filled 2017-11-09 (×10): qty 50

## 2017-11-09 MED ORDER — HEPARIN SODIUM (PORCINE) 5000 UNIT/ML IJ SOLN
5000.0000 [IU] | Freq: Three times a day (TID) | INTRAMUSCULAR | Status: DC
  Administered 2017-11-09 – 2017-11-14 (×15): 5000 [IU] via SUBCUTANEOUS
  Filled 2017-11-09 (×14): qty 1

## 2017-11-09 MED ORDER — BACLOFEN 10 MG PO TABS
10.0000 mg | ORAL_TABLET | Freq: Three times a day (TID) | ORAL | Status: DC
  Administered 2017-11-09 (×3): 10 mg
  Filled 2017-11-09 (×3): qty 1

## 2017-11-09 MED ORDER — PANTOPRAZOLE SODIUM 40 MG PO PACK
40.0000 mg | PACK | Freq: Every day | ORAL | Status: DC
  Administered 2017-11-09: 40 mg
  Filled 2017-11-09: qty 20

## 2017-11-09 MED ORDER — MIDAZOLAM HCL 2 MG/2ML IJ SOLN
2.0000 mg | Freq: Once | INTRAMUSCULAR | Status: DC
  Administered 2017-11-09: 2 mg via INTRAVENOUS
  Filled 2017-11-09: qty 2

## 2017-11-09 MED ORDER — MIDAZOLAM HCL 2 MG/2ML IJ SOLN
2.0000 mg | INTRAMUSCULAR | Status: DC | PRN
  Administered 2017-11-09: 2 mg via INTRAVENOUS
  Filled 2017-11-09: qty 2

## 2017-11-09 MED ORDER — VITAL HIGH PROTEIN PO LIQD
1000.0000 mL | ORAL | Status: DC
  Administered 2017-11-09: 1000 mL
  Filled 2017-11-09 (×2): qty 1000

## 2017-11-09 MED ORDER — SODIUM CHLORIDE 0.9 % IV SOLN: 250.0000 mL | INTRAVENOUS | Status: DC | PRN

## 2017-11-09 MED ORDER — ACETAMINOPHEN 160 MG/5ML PO SOLN
650.0000 mg | Freq: Four times a day (QID) | ORAL | Status: DC | PRN
  Administered 2017-11-09 – 2017-11-12 (×7): 650 mg via ORAL
  Filled 2017-11-09 (×7): qty 20.3

## 2017-11-09 MED ORDER — MAGNESIUM SULFATE 4 GM/100ML IV SOLN
4.0000 g | Freq: Once | INTRAVENOUS | Status: AC
  Administered 2017-11-09: 4 g via INTRAVENOUS
  Filled 2017-11-09: qty 100

## 2017-11-09 MED ORDER — PROPOFOL 1000 MG/100ML IV EMUL
INTRAVENOUS | Status: AC
  Administered 2017-11-09: 5 ug/kg/min via INTRAVENOUS
  Filled 2017-11-09: qty 100

## 2017-11-09 MED ORDER — MIDAZOLAM HCL 2 MG/2ML IJ SOLN: 2.0000 mg | INTRAMUSCULAR | Status: DC | PRN

## 2017-11-09 MED ORDER — SODIUM CHLORIDE 0.9 % IV SOLN
25.0000 ug/h | INTRAVENOUS | Status: DC
  Administered 2017-11-09: 100 ug/h via INTRAVENOUS
  Administered 2017-11-09: 125 ug/h via INTRAVENOUS
  Filled 2017-11-09 (×2): qty 50

## 2017-11-09 MED ORDER — ETOMIDATE 2 MG/ML IV SOLN
INTRAVENOUS | Status: AC | PRN
  Administered 2017-11-09: 10 mg via INTRAVENOUS

## 2017-11-09 MED ORDER — FENTANYL BOLUS VIA INFUSION
50.0000 ug | INTRAVENOUS | Status: DC | PRN
  Filled 2017-11-09: qty 50

## 2017-11-09 MED ORDER — FAMOTIDINE IN NACL 20-0.9 MG/50ML-% IV SOLN: 20.0000 mg | Freq: Two times a day (BID) | INTRAVENOUS | Status: DC

## 2017-11-09 MED ORDER — VANCOMYCIN HCL IN DEXTROSE 750-5 MG/150ML-% IV SOLN
750.0000 mg | Freq: Two times a day (BID) | INTRAVENOUS | Status: DC
  Administered 2017-11-09 – 2017-11-10 (×2): 750 mg via INTRAVENOUS
  Filled 2017-11-09 (×2): qty 150

## 2017-11-09 MED ORDER — SODIUM CHLORIDE 0.9 % IV SOLN
INTRAVENOUS | Status: DC
  Administered 2017-11-09 – 2017-11-13 (×6): via INTRAVENOUS

## 2017-11-09 MED ORDER — SODIUM CHLORIDE 0.9 % IV SOLN
25.0000 ug/h | INTRAVENOUS | Status: DC
  Administered 2017-11-09: 50 ug/h via INTRAVENOUS
  Filled 2017-11-09: qty 50

## 2017-11-09 MED ORDER — SUCCINYLCHOLINE CHLORIDE 20 MG/ML IJ SOLN
INTRAMUSCULAR | Status: AC | PRN
  Administered 2017-11-09: 150 mg via INTRAVENOUS

## 2017-11-09 MED ORDER — VANCOMYCIN HCL IN DEXTROSE 1-5 GM/200ML-% IV SOLN
1000.0000 mg | Freq: Once | INTRAVENOUS | Status: AC
  Administered 2017-11-09: 1000 mg via INTRAVENOUS
  Filled 2017-11-09: qty 200

## 2017-11-09 MED ORDER — ASPIRIN 81 MG PO CHEW: 324.0000 mg | CHEWABLE_TABLET | ORAL | Status: AC

## 2017-11-09 MED ORDER — PRO-STAT SUGAR FREE PO LIQD
30.0000 mL | Freq: Two times a day (BID) | ORAL | Status: DC
  Administered 2017-11-09 (×2): 30 mL
  Filled 2017-11-09 (×2): qty 30

## 2017-11-09 MED ORDER — PANTOPRAZOLE SODIUM 40 MG IV SOLR
40.0000 mg | Freq: Every day | INTRAVENOUS | Status: DC
  Administered 2017-11-09: 40 mg via INTRAVENOUS
  Filled 2017-11-09: qty 40

## 2017-11-09 MED ORDER — FENTANYL CITRATE (PF) 100 MCG/2ML IJ SOLN
50.0000 ug | Freq: Once | INTRAMUSCULAR | Status: AC
  Administered 2017-11-09: 50 ug via INTRAVENOUS

## 2017-11-09 MED ORDER — SODIUM CHLORIDE 0.9 % IV SOLN
0.5000 mg/h | INTRAVENOUS | Status: DC
  Filled 2017-11-09: qty 10

## 2017-11-09 MED ORDER — ASPIRIN 300 MG RE SUPP
300.0000 mg | RECTAL | Status: AC
  Administered 2017-11-09: 300 mg via RECTAL
  Filled 2017-11-09: qty 1

## 2017-11-09 MED ORDER — VANCOMYCIN HCL 10 G IV SOLR
1250.0000 mg | INTRAVENOUS | Status: DC
  Filled 2017-11-09: qty 1250

## 2017-11-09 MED ORDER — FENTANYL BOLUS VIA INFUSION
50.0000 ug | INTRAVENOUS | Status: DC | PRN
  Administered 2017-11-09: 50 ug via INTRAVENOUS
  Filled 2017-11-09: qty 50

## 2017-11-09 MED ORDER — PROPOFOL 1000 MG/100ML IV EMUL
0.0000 ug/kg/min | INTRAVENOUS | Status: DC
  Administered 2017-11-09: 5 ug/kg/min via INTRAVENOUS
  Administered 2017-11-09: 20 ug/kg/min via INTRAVENOUS
  Filled 2017-11-09: qty 100

## 2017-11-09 NOTE — Progress Notes (Signed)
[facility] Progress Note Patient Name: Phillip [name] DOB: 23-Feb-1966 MRN: [id number]   Date of Service  11/09/2017  HPI/Events of Note  Magnesium 1.4. Serum creatinine 1.3.   eICU Interventions  Magnesium sulfate 4 g IV ordered      Intervention Category Intermediate Interventions: Electrolyte abnormality - evaluation and management  Tera Partridge 11/09/2017, 5:26 AM

## 2017-11-09 NOTE — Progress Notes (Signed)
Pharmacy Antibiotic Note  Phillip [name] is a 51 y.o. male with altered mental status admitted on 11/08/2017 with sepsis.  Pharmacy has been consulted for zosyn and vancomycin dosing.  Plan: Zosyn 3.375g IV q8h (4 hour infusion).  Vancomycin 1 Gm x1 then 1250 mg IV q24h for est AUC=477 Goal AUC=400-500 Daily  Scr  Height: 5\' 11"  (180.3 cm) Weight: 220 lb (99.8 kg) IBW/kg (Calculated) : 75.3  Temp (24hrs), Avg:100 F (37.8 C), Min:100 F (37.8 C), Max:100 F (37.8 C)  Recent Labs  Lab 11/08/17 2255 11/08/17 2310  WBC 27.7*  --   CREATININE 1.85*  --   LATICACIDVEN  --  1.71    Estimated Creatinine Clearance: 58.8 mL/min (A) (by C-G formula based on SCr of 1.85 mg/dL (H)).    No Known Allergies  Antimicrobials this admission: 11/30 cefepime >> x1 ED 12/1 zosyn >>  12/1 vancomycin >>  Dose adjustments this admission:   Microbiology results:  BCx:   UCx:    Sputum:    MRSA PCR:   Thank you for allowing pharmacy to be a part of this patient's care.  Lawana Pai R 11/09/2017 1:32 AM
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
## 2017-11-09 NOTE — Progress Notes (Signed)
Pharmacy Antibiotic Note  Phillip [name] is a 51 y.o. male with altered mental status admitted on 11/08/2017 with sepsis.  Pharmacy has been consulted for zosyn and vancomycin dosing.  SCr improved following admission, warranting dose adjustment to vancomycin.  Noted history of quadriplegia will likely overestimate renal function.  Plan: Adjust vancomycin to 750 mg IV q12h. Goal AUC=400-500 Continue Zosyn 3.375g IV q8h (4 hour infusion time).  Monitor SCr.  Height: 5\' 11"  (180.3 cm) Weight: 205 lb 4 oz (93.1 kg) IBW/kg (Calculated) : 75.3  Temp (24hrs), Avg:100.5 F (38.1 C), Min:98.6 F (37 C), Max:102.2 F (39 C)  Recent Labs  Lab 11/08/17 2255 11/08/17 2310 11/09/17 0354 11/09/17 0745  WBC 27.7*  --  18.3*  --   CREATININE 1.85*  --  1.37*  --   LATICACIDVEN  --  1.71 2.2* 1.6    Estimated Creatinine Clearance: 76.9 mL/min (A) (by C-G formula based on SCr of 1.37 mg/dL (H)).    No Known Allergies  Antimicrobials this admission: 12/1 cefepime >> x1 ED 12/1 zosyn >>  12/1 vancomycin >>  Dose adjustments this admission:  Microbiology results: 12/1 BCx:  12/1 UCx:  12/1 MRSA PCR: neg  Thank you for allowing pharmacy to be a part of this patient's care.  Hershal Coria 11/09/2017 4:23 PM
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
## 2017-11-09 NOTE — Progress Notes (Addendum)
Brief Nutrition Note  Consult received for enteral/tube feeding initiation and management.  Adult Enteral Nutrition Protocol initiated. Full assessment to follow. Order in place for Vital High Protein @ 40 mL/hr with 30 mL Prostat BID which provides 1160 kcal, 114 grams of protein, and 802 mL free water.  Admitting Dx: Sepsis, due to unspecified organism ([place]) [A41.9] Glasgow coma scale total score 3-8, at arrival to emergency department [facility]) [I34.7425] Acute respiratory failure ([place]) [J96.00]  Body mass index is 28.63 kg/m. Pt meets criteria for overweight based on current BMI.  Labs:  Recent Labs  Lab 11/08/17 2255 11/09/17 0354  NA 141 140  K 4.7 4.3  CL 106 110  CO2 25 20*  BUN 25* 20  CREATININE 1.85* 1.37*  CALCIUM 9.1 8.2*  MG  --  1.4*  PHOS  --  3.3  GLUCOSE 131* 139*       Jarome Matin, MS, RD, LDN, [facility] Inpatient Clinical Dietitian Pager # [phone] After hours/weekend pager # [phone]

## 2017-11-10 ENCOUNTER — Inpatient Hospital Stay (HOSPITAL_COMMUNITY): Payer: Medicare Other

## 2017-11-10 DIAGNOSIS — R6520 Severe sepsis without septic shock: Secondary | ICD-10-CM

## 2017-11-10 DIAGNOSIS — A419 Sepsis, unspecified organism: Secondary | ICD-10-CM

## 2017-11-10 LAB — BLOOD CULTURE ID PANEL (REFLEXED)
Acinetobacter baumannii: NOT DETECTED
CANDIDA KRUSEI: NOT DETECTED
CARBAPENEM RESISTANCE: NOT DETECTED
Candida albicans: NOT DETECTED
Candida glabrata: NOT DETECTED
Candida parapsilosis: NOT DETECTED
Candida tropicalis: NOT DETECTED
ENTEROBACTERIACEAE SPECIES: DETECTED — AB
ENTEROCOCCUS SPECIES: NOT DETECTED
ESCHERICHIA COLI: NOT DETECTED
Enterobacter cloacae complex: NOT DETECTED
Haemophilus influenzae: NOT DETECTED
KLEBSIELLA OXYTOCA: NOT DETECTED
Klebsiella pneumoniae: DETECTED — AB
LISTERIA MONOCYTOGENES: NOT DETECTED
Neisseria meningitidis: NOT DETECTED
PSEUDOMONAS AERUGINOSA: NOT DETECTED
Proteus species: NOT DETECTED
STAPHYLOCOCCUS AUREUS BCID: NOT DETECTED
STREPTOCOCCUS PNEUMONIAE: NOT DETECTED
STREPTOCOCCUS PYOGENES: NOT DETECTED
Serratia marcescens: NOT DETECTED
Staphylococcus species: NOT DETECTED
Streptococcus agalactiae: NOT DETECTED
Streptococcus species: NOT DETECTED

## 2017-11-10 LAB — GLUCOSE, CAPILLARY
GLUCOSE-CAPILLARY: 144 mg/dL — AB (ref 65–99)
GLUCOSE-CAPILLARY: 94 mg/dL (ref 65–99)
GLUCOSE-CAPILLARY: 91 mg/dL (ref 65–99)
Glucose-Capillary: 148 mg/dL — ABNORMAL HIGH (ref 65–99)
Glucose-Capillary: 175 mg/dL — ABNORMAL HIGH (ref 65–99)
Glucose-Capillary: 203 mg/dL — ABNORMAL HIGH (ref 65–99)
Glucose-Capillary: 92 mg/dL (ref 65–99)

## 2017-11-10 LAB — BASIC METABOLIC PANEL
ANION GAP: 8 (ref 5–15)
BUN: 15 mg/dL (ref 6–20)
CALCIUM: 8 mg/dL — AB (ref 8.9–10.3)
CO2: 23 mmol/L (ref 22–32)
Chloride: 106 mmol/L (ref 101–111)
Creatinine, Ser: 1.17 mg/dL (ref 0.61–1.24)
GFR calc non Af Amer: >60 mL/min (ref >60)
Glucose, Bld: 168 mg/dL — ABNORMAL HIGH (ref 65–99)
Potassium: 3.6 mmol/L (ref 3.5–5.1)
SODIUM: 137 mmol/L (ref 135–145)

## 2017-11-10 LAB — CBC
HEMATOCRIT: 24.7 % — AB (ref 39.0–52.0)
HEMOGLOBIN: 7.9 g/dL — AB (ref 13.0–17.0)
MCH: 29.6 pg (ref 26.0–34.0)
MCHC: 32 g/dL (ref 30.0–36.0)
MCV: 92.5 fL (ref 78.0–100.0)
Platelets: 247 10*3/uL (ref 150–400)
RBC: 2.67 MIL/uL — AB (ref 4.22–5.81)
RDW: 15.2 % (ref 11.5–15.5)
WBC: 14.8 10*3/uL — AB (ref 4.0–10.5)

## 2017-11-10 LAB — PHOSPHORUS: Phosphorus: 1.9 mg/dL — ABNORMAL LOW (ref 2.5–4.6)

## 2017-11-10 LAB — MAGNESIUM: Magnesium: 2.1 mg/dL (ref 1.7–2.4)

## 2017-11-10 LAB — PROCALCITONIN: Procalcitonin: 6.49 ng/mL

## 2017-11-10 MED ORDER — METOPROLOL TARTRATE 25 MG PO TABS
12.5000 mg | ORAL_TABLET | Freq: Two times a day (BID) | ORAL | Status: DC
  Administered 2017-11-10 – 2017-11-14 (×9): 12.5 mg via ORAL
  Filled 2017-11-10 (×9): qty 1

## 2017-11-10 MED ORDER — GENTAMICIN SULFATE 40 MG/ML IJ SOLN
580.0000 mg | Freq: Once | INTRAVENOUS | Status: AC
  Administered 2017-11-10: 580 mg via INTRAVENOUS
  Filled 2017-11-10: qty 14.5

## 2017-11-10 MED ORDER — AMITRIPTYLINE HCL 25 MG PO TABS
25.0000 mg | ORAL_TABLET | Freq: Every day | ORAL | Status: DC
  Administered 2017-11-10 – 2017-11-13 (×4): 25 mg via ORAL
  Filled 2017-11-10 (×4): qty 1

## 2017-11-10 MED ORDER — INSULIN ASPART 100 UNIT/ML ~~LOC~~ SOLN
0.0000 [IU] | SUBCUTANEOUS | Status: DC
  Administered 2017-11-10: 2 [IU] via SUBCUTANEOUS
  Administered 2017-11-10: 3 [IU] via SUBCUTANEOUS
  Administered 2017-11-10: 1 [IU] via SUBCUTANEOUS
  Administered 2017-11-10: 3 [IU] via SUBCUTANEOUS

## 2017-11-10 MED ORDER — POTASSIUM PHOSPHATES 15 MMOLE/5ML IV SOLN
20.0000 mmol | Freq: Once | INTRAVENOUS | Status: AC
  Administered 2017-11-10: 20 mmol via INTRAVENOUS
  Filled 2017-11-10: qty 6.67

## 2017-11-10 MED ORDER — CHLORHEXIDINE GLUCONATE 0.12% ORAL RINSE (MEDLINE KIT)
15.0000 mL | Freq: Two times a day (BID) | OROMUCOSAL | Status: DC
  Administered 2017-11-10 (×2): 15 mL via OROMUCOSAL

## 2017-11-10 MED ORDER — PANTOPRAZOLE SODIUM 40 MG IV SOLR
40.0000 mg | INTRAVENOUS | Status: DC
  Administered 2017-11-10 – 2017-11-11 (×2): 40 mg via INTRAVENOUS
  Filled 2017-11-10 (×2): qty 40

## 2017-11-10 MED ORDER — GABAPENTIN 300 MG PO CAPS
300.0000 mg | ORAL_CAPSULE | Freq: Three times a day (TID) | ORAL | Status: DC
  Administered 2017-11-10 – 2017-11-14 (×11): 300 mg via ORAL
  Filled 2017-11-10 (×11): qty 1

## 2017-11-10 MED ORDER — ORAL CARE MOUTH RINSE
15.0000 mL | Freq: Four times a day (QID) | OROMUCOSAL | Status: DC
  Administered 2017-11-10 (×2): 15 mL via OROMUCOSAL

## 2017-11-10 MED ORDER — ATORVASTATIN CALCIUM 40 MG PO TABS
40.0000 mg | ORAL_TABLET | Freq: Every day | ORAL | Status: DC
  Administered 2017-11-10 – 2017-11-14 (×4): 40 mg via ORAL
  Filled 2017-11-10 (×4): qty 1

## 2017-11-10 MED ORDER — BACLOFEN 10 MG PO TABS
10.0000 mg | ORAL_TABLET | Freq: Three times a day (TID) | ORAL | Status: DC
  Administered 2017-11-10 – 2017-11-13 (×7): 10 mg via ORAL
  Filled 2017-11-10 (×7): qty 1
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
## 2017-11-10 NOTE — Progress Notes (Signed)
[place] Progress Note Patient Name: ORLANDO DEVEREUX DOB: [birth year] MRN: [id number]   Date of Service  11/10/2017  HPI/Events of Note  Blood glucose = 204.   eICU Interventions  Will order: 1. Q 4 hour sensitive Novolog SSI.      Intervention Category Major Interventions: Hyperglycemia - active titration of insulin therapy  Sommer,Steven Eugene 11/10/2017, 12:04 AM

## 2017-11-10 NOTE — Progress Notes (Signed)
[place] Progress Note Patient Name: Phillip [name] DOB: 09-18-1966 MRN: [id number]   Date of Service  11/10/2017  HPI/Events of Note  K+ = 3.6, PO4--- = 1.9 and Creatinine = 1.17.  eICU Interventions  Will replace K+ and PO4---     Intervention Category Major Interventions: Electrolyte abnormality - evaluation and management  Sommer,Steven Eugene 11/10/2017, 5:10 AM

## 2017-11-10 NOTE — Progress Notes (Addendum)
[place] Progress Note Patient Name: Phillip [name] DOB: [birth year] MRN: [id number]   Date of Service  11/10/2017  HPI/Events of Note  Blood culture report from 11/08/2017 growing Klebsiella and Enterobacter. Patient is currently on Vancomycin and Zosyn. I am concerned that Tmax = 102.6  WBC yesterday morning = 18.3. In addition the patient is from a SNF with a Hx of neurogenic bladder and recurrent UTI. Obvious concern is for a resistant organism. Will add Gentamicin X 1 dose per pharmacy consult. Primary team can decide if they want to continue the Gentamicin or not.  eICU Interventions  Will give Gentamicin X 1 dose per pharmacy now.      Intervention Category Major Interventions: Infection - evaluation and management  Derrisha Foos [place] 11/10/2017, 1:29 AM

## 2017-11-10 NOTE — Progress Notes (Signed)
Nutrition Brief Note  RD was consulted for TF initiation and management.  Pt was just extubated. MD discontinued RD consult.   Weight has increased since 2017.   Wt Readings from Last 15 Encounters:  11/10/17 211 lb 3.2 oz (95.8 kg)  10/23/17 217 lb (98.4 kg)  09/14/17 208 lb (94.3 kg)  08/09/16 208 lb 9.6 oz (94.6 kg)  06/02/16 193 lb 12.6 oz (87.9 kg)  02/17/16 179 lb 0.2 oz (81.2 kg)  12/29/15 151 lb 8 oz (68.7 kg)  12/02/15 152 lb 16 oz (69.4 kg)  11/10/15 155 lb 10.3 oz (70.6 kg)  08/24/15 147 lb (66.7 kg)  06/10/15 170 lb 6.4 oz (77.3 kg)  06/06/15 157 lb (71.2 kg)  06/03/15 182 lb 8.7 oz (82.8 kg)  10/10/14 168 lb 3.4 oz (76.3 kg)  09/06/14 170 lb (77.1 kg)    Body mass index is 29.46 kg/m. Patient meets criteria for overweight based on current BMI.   Current diet order is regular. Pt just extubated. Labs and medications reviewed.   No nutrition interventions warranted at this time. If nutrition issues arise, please consult RD.   Phillip Bibles, MS, RD, [place] Dietitian Pager: [phone] After Hours Pager: [phone]

## 2017-11-10 NOTE — Progress Notes (Signed)
Pharmacy Antibiotic Note  Phillip [name] is a 51 y.o. male admitted on 11/08/2017 with sepsis.  Pharmacy has been consulted for Gentamicin dosing.  Plan: Gentamicin 580 mg IV x1 now F/u continuation  Height: 5\' 11"  (180.3 cm) Weight: 205 lb 4 oz (93.1 kg) IBW/kg (Calculated) : 75.3  Temp (24hrs), Avg:100.9 F (38.3 C), Min:98.5 F (36.9 C), Max:102.6 F (39.2 C)  Recent Labs  Lab 11/08/17 2255 11/08/17 2310 11/09/17 0354 11/09/17 0745  WBC 27.7*  --  18.3*  --   CREATININE 1.85*  --  1.37*  --   LATICACIDVEN  --  1.71 2.2* 1.6    Estimated Creatinine Clearance: 76.9 mL/min (A) (by C-G formula based on SCr of 1.37 mg/dL (H)).    No Known Allergies  Antimicrobials this admission: 12/1 zosyn >>  12/1 vancomycin >>  12/2 gentamicin >> x1  Dose adjustments this admission:   Microbiology results:  BCx:   UCx:    Sputum:    MRSA PCR:   Thank you for allowing pharmacy to be a part of this patient's care.  Dorrene German 11/10/2017 1:46 AM

## 2017-11-10 NOTE — Procedures (Signed)
Extubation Procedure Note  Patient Details:   Name: Phillip [name] DOB: [birth year] MRN: [id number]   Airway Documentation:  Airway 8 mm (Active)  Secured at (cm) 23 cm 11/10/2017  9:23 AM  Measured From Lips 11/10/2017  9:23 AM  [place] 11/10/2017  9:23 AM  Secured By [employer] 11/10/2017  9:23 AM  Tube Holder Repositioned Yes 11/10/2017  9:23 AM  Cuff Pressure (cm H2O) 24 cm H2O 11/09/2017  7:35 PM  Site Condition Dry 11/10/2017  9:23 AM    Evaluation  O2 sats: stable throughout Complications: No apparent complications Patient did tolerate procedure well. Bilateral Breath Sounds: Diminished   Yes   Patient extubated per MD order. Patient on 4 L [place] with O2 sats of 100%. RT will continue to monitor patient.   Lamonte Sakai 11/10/2017, 10:08 AM

## 2017-11-10 NOTE — Progress Notes (Addendum)
PHARMACY - PHYSICIAN COMMUNICATION CRITICAL VALUE ALERT - BLOOD CULTURE IDENTIFICATION (BCID)  Phillip [name] is an 51 y.o. male who presented to [facility] on 11/08/2017 with a chief complaint of sepsis  Assessment:  urinary (include suspected source if known) 1 of 4 bottles  Name of physician (or Provider) Contacted: Gretchen in black box will tell Dr Oletta Darter  Current antibiotics: zosyn and vancomycin  Changes to prescribed antibiotics recommended:  Consider narrowing to Rocephin when clinically appropriate.  0145 MD returned call concerned with increased temp. Adding x1 dose of Gentamicin per Rx.  Results for orders placed or performed during the hospital encounter of 11/08/17  Blood Culture ID Panel (Reflexed) (Collected: 11/08/2017 11:55 PM)  Result Value Ref Range   Enterococcus species NOT DETECTED NOT DETECTED   Listeria monocytogenes NOT DETECTED NOT DETECTED   Staphylococcus species NOT DETECTED NOT DETECTED   Staphylococcus aureus NOT DETECTED NOT DETECTED   Streptococcus species NOT DETECTED NOT DETECTED   Streptococcus agalactiae NOT DETECTED NOT DETECTED   Streptococcus pneumoniae NOT DETECTED NOT DETECTED   Streptococcus pyogenes NOT DETECTED NOT DETECTED   Acinetobacter baumannii NOT DETECTED NOT DETECTED   Enterobacteriaceae species DETECTED (A) NOT DETECTED   Enterobacter cloacae complex NOT DETECTED NOT DETECTED   Escherichia coli NOT DETECTED NOT DETECTED   Klebsiella oxytoca NOT DETECTED NOT DETECTED   Klebsiella pneumoniae DETECTED (A) NOT DETECTED   Proteus species NOT DETECTED NOT DETECTED   Serratia marcescens NOT DETECTED NOT DETECTED   Carbapenem resistance NOT DETECTED NOT DETECTED   Haemophilus influenzae NOT DETECTED NOT DETECTED   Neisseria meningitidis NOT DETECTED NOT DETECTED   Pseudomonas aeruginosa NOT DETECTED NOT DETECTED   Candida albicans NOT DETECTED NOT DETECTED   Candida glabrata NOT DETECTED NOT DETECTED   Candida krusei NOT  DETECTED NOT DETECTED   Candida parapsilosis NOT DETECTED NOT DETECTED   Candida tropicalis NOT DETECTED NOT DETECTED    Dorrene German 11/10/2017  1:18 AM

## 2017-11-11 ENCOUNTER — Inpatient Hospital Stay (HOSPITAL_COMMUNITY): Payer: Medicare Other

## 2017-11-11 LAB — BASIC METABOLIC PANEL
Anion gap: 9 (ref 5–15)
BUN: 8 mg/dL (ref 6–20)
CHLORIDE: 107 mmol/L (ref 101–111)
CO2: 23 mmol/L (ref 22–32)
CREATININE: 1.08 mg/dL (ref 0.61–1.24)
Calcium: 8.2 mg/dL — ABNORMAL LOW (ref 8.9–10.3)
GFR calc Af Amer: >60 mL/min (ref >60)
GFR calc non Af Amer: >60 mL/min (ref >60)
Glucose, Bld: 84 mg/dL (ref 65–99)
Potassium: 3.7 mmol/L (ref 3.5–5.1)
Sodium: 139 mmol/L (ref 135–145)

## 2017-11-11 LAB — GLUCOSE, CAPILLARY
GLUCOSE-CAPILLARY: 95 mg/dL (ref 65–99)
GLUCOSE-CAPILLARY: 73 mg/dL (ref 65–99)
GLUCOSE-CAPILLARY: 95 mg/dL (ref 65–99)
Glucose-Capillary: 73 mg/dL (ref 65–99)
Glucose-Capillary: 81 mg/dL (ref 65–99)

## 2017-11-11 LAB — PROCALCITONIN: PROCALCITONIN: 4.17 ng/mL

## 2017-11-11 LAB — CBC
HCT: 25.2 % — ABNORMAL LOW (ref 39.0–52.0)
Hemoglobin: 8.1 g/dL — ABNORMAL LOW (ref 13.0–17.0)
MCH: 29.3 pg (ref 26.0–34.0)
MCHC: 32.1 g/dL (ref 30.0–36.0)
MCV: 91.3 fL (ref 78.0–100.0)
PLATELETS: 311 10*3/uL (ref 150–400)
RBC: 2.76 MIL/uL — AB (ref 4.22–5.81)
RDW: 15.2 % (ref 11.5–15.5)
WBC: 11.8 10*3/uL — ABNORMAL HIGH (ref 4.0–10.5)

## 2017-11-11 LAB — PHOSPHORUS: Phosphorus: 2.6 mg/dL (ref 2.5–4.6)

## 2017-11-11 LAB — MAGNESIUM: Magnesium: 1.8 mg/dL (ref 1.7–2.4)

## 2017-11-11 MED ORDER — INSULIN ASPART 100 UNIT/ML ~~LOC~~ SOLN
0.0000 [IU] | Freq: Three times a day (TID) | SUBCUTANEOUS | Status: DC
  Administered 2017-11-13: 3 [IU] via SUBCUTANEOUS
  Administered 2017-11-13: 2 [IU] via SUBCUTANEOUS
  Administered 2017-11-13: 3 [IU] via SUBCUTANEOUS
  Administered 2017-11-14 (×2): 2 [IU] via SUBCUTANEOUS

## 2017-11-11 MED ORDER — COLLAGENASE 250 UNIT/GM EX OINT
TOPICAL_OINTMENT | Freq: Every day | CUTANEOUS | Status: DC
  Administered 2017-11-12 – 2017-11-14 (×4): via TOPICAL
  Filled 2017-11-11: qty 30
  Filled 2017-11-11: qty 90

## 2017-11-11 MED ORDER — ORAL CARE MOUTH RINSE
15.0000 mL | Freq: Two times a day (BID) | OROMUCOSAL | Status: DC
  Administered 2017-11-12 – 2017-11-13 (×4): 15 mL via OROMUCOSAL

## 2017-11-11 MED ORDER — PANTOPRAZOLE SODIUM 40 MG PO TBEC
40.0000 mg | DELAYED_RELEASE_TABLET | Freq: Every day | ORAL | Status: DC
  Administered 2017-11-13 – 2017-11-14 (×2): 40 mg via ORAL
  Filled 2017-11-11 (×2): qty 1

## 2017-11-11 MED ORDER — INSULIN ASPART 100 UNIT/ML ~~LOC~~ SOLN
0.0000 [IU] | Freq: Every day | SUBCUTANEOUS | Status: DC
  Administered 2017-11-12 – 2017-11-13 (×2): 2 [IU] via SUBCUTANEOUS
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
## 2017-11-11 NOTE — Progress Notes (Signed)
Update the plan of care Spoke with infectious disease, they recommend a 2-week course of antibiotics.  At this time do not recommend prophylactic antibiotics however given presence of stone noted on ultrasound they would be concerned that this could perhaps be nidus for infection and recommended urology evaluation. Plan:  Continue antibiotics complete 2-week total therapy We will ask urology to see him.  Erick Colace ACNP-BC [place] Pager # [phone] OR # [phone] if no answer

## 2017-11-11 NOTE — Progress Notes (Signed)
PHARMACIST - PHYSICIAN COMMUNICATION  DR:   Halford Chessman  CONCERNING: IV to Oral Route Change Policy  RECOMMENDATION: This patient is receiving Protonix by the intravenous route.  Based on criteria approved by the Pharmacy and Therapeutics Committee, the intravenous medication(s) is/are being converted to the equivalent oral dose form(s).   DESCRIPTION: These criteria include:  The patient is eating (either orally or via tube) and/or has been taking other orally administered medications for a least 24 hours  The patient has no evidence of active gastrointestinal bleeding or impaired GI absorption (gastrectomy, short bowel, patient on TNA or NPO).  If you have questions about this conversion, please contact the Pharmacy Department  []   [phone] )  [facility] []   [phone] )  [facility] []   [phone] )  [facility] []   [phone] )  [facility] [x]   [phone] )  [place], PharmD, [place] Pager: [phone] 11/11/2017 11:35 AM

## 2017-11-11 NOTE — Care Management Note (Signed)
Case Management Note  Patient Details  Name: Phillip [name] MRN: [id number] Date of Birth: December 16, 1965  Subjective/Objective:                  urosepsis  Action/Plan: Date: November 11, 2017 Velva Harman, BSN, [place], [place] [phone] Chart and notes review for patient progress and needs. Will follow for case management and discharge needs. Next review date: [id number]  Expected Discharge Date:                  Expected Discharge Plan:  [place]  In-House Referral:  Clinical Social Work  Discharge planning Services  CM Consult  Post Acute Care Choice:    Choice offered to:     DME Arranged:    DME Agency:     HH Arranged:    [place] Agency:     Status of Service:  In process, will continue to follow  If discussed at Long Length of Stay Meetings, dates discussed:    Additional Comments:  Leeroy Cha, RN 11/11/2017, 9:45 AM

## 2017-11-11 NOTE — Progress Notes (Signed)
[place] Progress Note Patient Name: Phillip [name] DOB: [birth year] MRN: [id number]   Date of Service  11/11/2017  HPI/Events of Note  Called by radiolgy d/t CT Scan of Abdomen and Pelvis results:  1. 1.5 cm obstructive calculus in the proximal right ureter causing moderate right hydronephrosis. Additional large right renal calculi within the right renal pelvis and parenchyma. 2. 8 mm right distal ureteral calculus, without associated upstream ureteral dilation, suggesting that this is a nonobstructive calculus 3. Left nephrolithiasis without evidence of hydronephrosis or hydroureter. 4. Diffuse thickening of the urinary bladder wall, decompressed around suprapubic catheter and therefore poorly evaluated.  Urology has already seen the patient in consultation and are aware of the findings. They plan for definite therapy in the morning possibly in the OR.    eICU Interventions  Further management per Urology.     Intervention Category Intermediate Interventions: Diagnostic test evaluation  Mallery Harshman Cornelia Copa 11/11/2017, 10:41 PM

## 2017-11-11 NOTE — Consult Note (Signed)
Urology Consult   Physician requesting consult: Dr. Kary Kos  Reason for consult: Right hydronephrosis   History of Present Illness: Phillip [name] is a 51 y.o. quadriplegic male with a history of neurogenic bladder and recurrent urinary tract infections. His bladder is currently managed with a suprapubic catheter. He is currently being treated for a urinary tract infection that led to altered mental status. Urology was consulted after the patient was found to have mild to moderate right-sided hydronephrosis on 11/09/2017. The patient currently denies right-sided flank pain, nausea/vomiting or hematuria over the last several days. His renal function is stabilized following resuscitation. His creatinine today was 1.08. His last suprapubic catheter exchange was approximately 1 month ago, per the patient.   Past Medical History:  Diagnosis Date  . Acute respiratory failure ([place]) 10/2017  . Ankle fracture, right 06/03/2015  . Broken hip ([place])   . Cervical spinal cord injury ([place])   . DJD (degenerative joint disease)   . Hip fracture requiring operative repair ([place]) 10/09/2014  . 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 Proteus septicemia ([place])   . Seizures ([place])   . Tobacco abuse     Past Surgical History:  Procedure Laterality Date  . APPLICATION OF A-CELL OF CHEST/ABDOMEN N/A 08/24/2015   Procedure: PLACEMENT OF A-CELL ANd Wound  VAC;  Surgeon: Theodoro Kos, DO;  Location: [facility];  Service: Plastics;  Laterality: N/A;  . APPLICATION OF WOUND VAC Right 11/11/2015   Procedure: APPLICATION OF WOUND VAC;  Surgeon: Meredith Pel, MD;  Location: [facility];  Service: Orthopedics;  Laterality: Right;  . CYSTOSCOPY N/A 08/09/2016   Procedure: CYSTOSCOPY;  Surgeon: Raynelle Bring, MD;  Location: [facility];  Service: Urology;  Laterality: N/A;  . HARDWARE REMOVAL Right 11/11/2015   Procedure: HARDWARE REMOVAL RIGHT ANKLE;  Surgeon: Meredith Pel, MD;  Location: [facility];  Service:  Orthopedics;  Laterality: Right;  . I&D EXTREMITY Right 11/11/2015   Procedure: IRRIGATION AND DEBRIDEMENT RIGHT ANKLE;  Surgeon: Meredith Pel, MD;  Location: [facility];  Service: Orthopedics;  Laterality: Right;  . INCISION AND DRAINAGE OF WOUND N/A 08/24/2015   Procedure: IRRIGATION AND DEBRIDEMENT SACRAL DECUBITUS ULCER ;  Surgeon: Theodoro Kos, DO;  Location: [facility];  Service: Plastics;  Laterality: N/A;  . INSERTION OF SUPRAPUBIC CATHETER N/A 08/09/2016   Procedure: INSERTION OF SUPRAPUBIC CATHETER;  Surgeon: Raynelle Bring, MD;  Location: [facility];  Service: Urology;  Laterality: N/A;  . INTRAMEDULLARY (IM) NAIL INTERTROCHANTERIC Right 10/09/2014   Procedure: INTRAMEDULLARY (IM) NAIL INTERTROCHANTRIC Right knee aspiration;  Surgeon: Melina Schools, MD;  Location: [facility];  Service: Orthopedics;  Laterality: Right;  . KNEE SURGERY     right knee surgery 1988  . ORIF ANKLE FRACTURE Right 05/26/2015   Procedure: OPEN REDUCTION INTERNAL FIXATION (ORIF) ANKLE FRACTURE;  Surgeon: Meredith Pel, MD;  Location: [facility];  Service: Orthopedics;  Laterality: Right;  . POSTERIOR CERVICAL FUSION/FORAMINOTOMY N/A 05/30/2015   Procedure: C3-C7 decompressive laminectomy with posterior cervical fusion utilizing lateral mass instrumentation and local bone grafting;  Surgeon: Earnie Larsson, MD;  Location: [facility];  Service: Neurosurgery;  Laterality: N/A;    Current Hospital Medications:  Home Meds:  Current Meds  Medication Sig  . acetaminophen (TYLENOL) 325 MG tablet Take 650 mg by mouth 3 (three) times daily.  Marland Kitchen amitriptyline (ELAVIL) 25 MG tablet Take 25 mg by mouth at bedtime.  Marland Kitchen atorvastatin (LIPITOR) 40 MG tablet Take 40 mg by mouth daily.  Marland Kitchen  baclofen (LIORESAL) 10 MG tablet Take 1 tablet (10 mg total) by mouth 3 (three) times daily. (Patient taking differently: Take 10 mg 3 (three) times daily by mouth. Take with 20mg  tablet to equal 30mg  total)  . baclofen (LIORESAL) 20 MG tablet Take 20 mg by  mouth 3 (three) times daily. Take with baclofen 10 mg to equal 30 mg  . bisacodyl (DULCOLAX) 10 MG suppository Place 10 mg rectally every 8 (eight) hours as needed for moderate constipation.   . cloNIDine (CATAPRES) 0.1 MG tablet Take 0.1 mg by mouth 3 (three) times daily.  . collagenase (SANTYL) ointment Apply 1 application every evening topically. Apply to left lateral foot  . ferrous sulfate 325 (65 FE) MG tablet Take 1 tablet (325 mg total) by mouth 2 (two) times daily with a meal.  . furosemide (LASIX) 20 MG tablet Take 1 tablet (20 mg total) by mouth daily.  Marland Kitchen gabapentin (NEURONTIN) 300 MG capsule Take 300 mg by mouth 3 (three) times daily.  . Melatonin 3 MG TABS Take 3-6 mg by mouth at bedtime as needed (for insomnia).  . metoprolol tartrate (LOPRESSOR) 25 MG tablet Take 0.5 tablets (12.5 mg total) by mouth 2 (two) times daily.  . Multiple Vitamin (MULTIVITAMIN WITH MINERALS) TABS tablet Take 1 tablet by mouth daily.  . ondansetron (ZOFRAN) 4 MG tablet Take 1 tablet (4 mg total) by mouth every 6 (six) hours as needed for nausea.  Marland Kitchen opium-belladonna (B&O SUPPRETTES) 16.2-60 MG suppository Place 1 suppository rectally every 8 (eight) hours as needed for bladder spasms.  . pantoprazole (PROTONIX) 40 MG tablet Take 1 tablet (40 mg total) by mouth daily.  . potassium chloride (K-DUR,KLOR-CON) 10 MEQ tablet Take 10 mEq daily by mouth.  . saccharomyces boulardii (FLORASTOR) 250 MG capsule Take 1 capsule (250 mg total) by mouth 2 (two) times daily.  . solifenacin (VESICARE) 5 MG tablet Take 5 mg by mouth daily.  . vitamin B-12 1000 MCG tablet Take 1 tablet (1,000 mcg total) by mouth daily.  . vitamin C (VITAMIN C) 500 MG tablet Take 1 tablet (500 mg total) by mouth 2 (two) times daily.  Marland Kitchen zinc sulfate 220 MG capsule Take 1 capsule (220 mg total) by mouth daily.    Scheduled Meds: . amitriptyline  25 mg Oral QHS  . atorvastatin  40 mg Oral Daily  . baclofen  10 mg Oral TID  . [START ON  11/12/2017] collagenase   Topical Daily  . gabapentin  300 mg Oral TID  . heparin  5,000 Units Subcutaneous Q8H  . insulin aspart  0-15 Units Subcutaneous TID WC  . insulin aspart  0-5 Units Subcutaneous QHS  . mouth rinse  15 mL Mouth Rinse BID  . metoprolol tartrate  12.5 mg Oral BID  . [START ON 11/12/2017] pantoprazole  40 mg Oral Daily   Continuous Infusions: . sodium chloride 125 mL/hr (11/11/17 0800)  . piperacillin-tazobactam (ZOSYN)  IV Stopped (11/11/17 1705)   PRN Meds:.acetaminophen (TYLENOL) oral liquid 160 mg/5 mL  Allergies: No Known Allergies  Family History  Problem Relation Age of Onset  . Hypertension Mother     Social History:  reports that he has been smoking cigarettes.  He has a 0.05 pack-year smoking history. he has never used smokeless tobacco. He reports that he does not drink alcohol or use drugs.  ROS: A complete review of systems was performed.  All systems are negative except for pertinent findings as noted.  Physical Exam:  Vital  signs in last 24 hours: Temp:  [98.3 F (36.8 C)-102.4 F (39.1 C)] 99.2 F (37.3 C) (12/03 1600) Pulse Rate:  [91-118] 108 (12/03 1600) Resp:  [19-38] 30 (12/03 1800) BP: (116-178)/(62-109) 157/91 (12/03 1800) SpO2:  [91 %-98 %] 91 % (12/03 1600) Weight:  [96.4 kg (212 lb 8.4 oz)] 96.4 kg (212 lb 8.4 oz) (12/03 0500) Constitutional:  Alert and oriented, No acute distress Cardiovascular: Regular rate and rhythm, No JVD Respiratory: Normal respiratory effort, Lungs clear bilaterally GI: Abdomen is soft, nontender, nondistended, no abdominal masses GU: No CVA tenderness.  20 [place] two-way Foley suprapubic catheter in place and draining yellow, but cloudy urine    Lymphatic: No lymphadenopathy Neurologic: Grossly intact, no focal deficits Psychiatric: Normal mood and affect  Laboratory Data:  Recent Labs    11/08/17 2255 11/09/17 0354 11/10/17 0328 11/11/17 0321  WBC 27.7* 18.3* 14.8* 11.8*  HGB 8.3* 10.2*  7.9* 8.1*  HCT 25.8* 32.2* 24.7* 25.2*  PLT 405* 283 247 311    Recent Labs    11/08/17 2255 11/09/17 0354 11/10/17 0328 11/11/17 0321  NA 141 140 137 139  K 4.7 4.3 3.6 3.7  CL 106 110 106 107  GLUCOSE 131* 139* 168* 84  BUN 25* 20 15 8   CALCIUM 9.1 8.2* 8.0* 8.2*  CREATININE 1.85* 1.37* 1.17 1.08     Results for orders placed or performed during the hospital encounter of 11/08/17 (from the past 24 hour(s))  Glucose, capillary     Status: None   Collection Time: 11/10/17  7:56 PM  Result Value Ref Range   Glucose-Capillary 92 65 - 99 mg/dL   Comment 1 Notify RN    Comment 2 Document in Chart   Glucose, capillary     Status: None   Collection Time: 11/10/17 11:04 PM  Result Value Ref Range   Glucose-Capillary 91 65 - 99 mg/dL   Comment 1 Notify RN    Comment 2 Document in Chart   Procalcitonin     Status: None   Collection Time: 11/11/17  3:21 AM  Result Value Ref Range   Procalcitonin 4.17 ng/mL  Phosphorus     Status: None   Collection Time: 11/11/17  3:21 AM  Result Value Ref Range   Phosphorus 2.6 2.5 - 4.6 mg/dL  Magnesium     Status: None   Collection Time: 11/11/17  3:21 AM  Result Value Ref Range   Magnesium 1.8 1.7 - 2.4 mg/dL  Basic metabolic panel     Status: Abnormal   Collection Time: 11/11/17  3:21 AM  Result Value Ref Range   Sodium 139 135 - 145 mmol/L   Potassium 3.7 3.5 - 5.1 mmol/L   Chloride 107 101 - 111 mmol/L   CO2 23 22 - 32 mmol/L   Glucose, Bld 84 65 - 99 mg/dL   BUN 8 6 - 20 mg/dL   Creatinine, Ser 1.08 0.61 - 1.24 mg/dL   Calcium 8.2 (L) 8.9 - 10.3 mg/dL   GFR calc non Af Amer >60 >60 mL/min   GFR calc Af Amer >60 >60 mL/min   Anion gap 9 5 - 15  CBC     Status: Abnormal   Collection Time: 11/11/17  3:21 AM  Result Value Ref Range   WBC 11.8 (H) 4.0 - 10.5 K/uL   RBC 2.76 (L) 4.22 - 5.81 MIL/uL   Hemoglobin 8.1 (L) 13.0 - 17.0 g/dL   HCT 25.2 (L) 39.0 - 52.0 %  MCV 91.3 78.0 - 100.0 fL   MCH 29.3 26.0 - 34.0 pg   MCHC  32.1 30.0 - 36.0 g/dL   RDW 15.2 11.5 - 15.5 %   Platelets 311 150 - 400 K/uL  Glucose, capillary     Status: None   Collection Time: 11/11/17  3:29 AM  Result Value Ref Range   Glucose-Capillary 73 65 - 99 mg/dL   Comment 1 Notify RN    Comment 2 Document in Chart   Glucose, capillary     Status: None   Collection Time: 11/11/17  8:53 AM  Result Value Ref Range   Glucose-Capillary 95 65 - 99 mg/dL  Glucose, capillary     Status: None   Collection Time: 11/11/17 12:48 PM  Result Value Ref Range   Glucose-Capillary 81 65 - 99 mg/dL   Comment 1 Notify RN    Comment 2 Document in Chart   Glucose, capillary     Status: None   Collection Time: 11/11/17  3:56 PM  Result Value Ref Range   Glucose-Capillary 73 65 - 99 mg/dL   Recent Results (from the past 240 hour(s))  Culture, blood (Routine x 2)     Status: Abnormal (Preliminary result)   Collection Time: 11/08/17 10:55 PM  Result Value Ref Range Status   Specimen Description BLOOD LEFT WRIST  Final   Special Requests IN PEDIATRIC BOTTLE Blood Culture adequate volume  Final   Culture  Setup Time   Final    GRAM NEGATIVE RODS IN PEDIATRIC BOTTLE CRITICAL VALUE NOTED.  VALUE IS CONSISTENT WITH PREVIOUSLY REPORTED AND CALLED VALUE. Performed at [facility], [place] [address], [place], [place] [address]    Culture KLEBSIELLA PNEUMONIAE (A)  Final   Report Status PENDING  Incomplete  Culture, blood (Routine x 2)     Status: Abnormal (Preliminary result)   Collection Time: 11/08/17 11:55 PM  Result Value Ref Range Status   Specimen Description BLOOD LEFT ANTECUBITAL  Final   Special Requests   Final    BOTTLES DRAWN AEROBIC AND ANAEROBIC Blood Culture adequate volume   Culture  Setup Time   Final    GRAM NEGATIVE RODS ANAEROBIC BOTTLE ONLY CRITICAL RESULT CALLED TO, READ BACK BY AND VERIFIED WITH: B.GREEN,PHARMD AT 0108 ON 11/10/17 BY G.MCADOO    Culture (A)  Final    KLEBSIELLA PNEUMONIAE SUSCEPTIBILITIES TO  FOLLOW Performed at [facility], [place] [address] [place], [place] [address]    Report Status PENDING  Incomplete  Blood Culture ID Panel (Reflexed)     Status: Abnormal   Collection Time: 11/08/17 11:55 PM  Result Value Ref Range Status   Enterococcus species NOT DETECTED NOT DETECTED Final   Listeria monocytogenes NOT DETECTED NOT DETECTED Final   Staphylococcus species NOT DETECTED NOT DETECTED Final   Staphylococcus aureus NOT DETECTED NOT DETECTED Final   Streptococcus species NOT DETECTED NOT DETECTED Final   Streptococcus agalactiae NOT DETECTED NOT DETECTED Final   Streptococcus pneumoniae NOT DETECTED NOT DETECTED Final   Streptococcus pyogenes NOT DETECTED NOT DETECTED Final   Acinetobacter baumannii NOT DETECTED NOT DETECTED Final   Enterobacteriaceae species DETECTED (A) NOT DETECTED Final    Comment: Enterobacteriaceae represent a large family of gram-negative bacteria, not a single organism. CRITICAL RESULT CALLED TO, READ BACK BY AND VERIFIED WITH: B.GREEN,PHARMD AT 0108 ON 11/10/17 BY G.MCADOO    Enterobacter cloacae complex NOT DETECTED NOT DETECTED Final   Escherichia coli NOT DETECTED NOT DETECTED  Final   Klebsiella oxytoca NOT DETECTED NOT DETECTED Final   Klebsiella pneumoniae DETECTED (A) NOT DETECTED Final    Comment: CRITICAL RESULT CALLED TO, READ BACK BY AND VERIFIED WITH: B.GREEN,PHARMD AT 0108 ON 11/10/17 BY G.MCADOO    Proteus species NOT DETECTED NOT DETECTED Final   Serratia marcescens NOT DETECTED NOT DETECTED Final   Carbapenem resistance NOT DETECTED NOT DETECTED Final   Haemophilus influenzae NOT DETECTED NOT DETECTED Final   Neisseria meningitidis NOT DETECTED NOT DETECTED Final   Pseudomonas aeruginosa NOT DETECTED NOT DETECTED Final   Candida albicans NOT DETECTED NOT DETECTED Final   Candida glabrata NOT DETECTED NOT DETECTED Final   Candida krusei NOT DETECTED NOT DETECTED Final   Candida parapsilosis NOT DETECTED NOT DETECTED Final    Candida tropicalis NOT DETECTED NOT DETECTED Final    Comment: Performed at [facility], [place]. [address], [place], [place] [address]  Urine culture     Status: Abnormal (Preliminary result)   Collection Time: 11/09/17  1:44 AM  Result Value Ref Range Status   Specimen Description URINE, CATHETERIZED  Final   Special Requests NONE  Final   Culture (A)  Final    >=100,000 COLONIES/mL PSEUDOMONAS AERUGINOSA >=100,000 COLONIES/mL KLEBSIELLA PNEUMONIAE REPEATING SENSITIVITIES FOR PSEUDOMONAS AERUGINOSA    Report Status PENDING  Incomplete   Organism ID, Bacteria KLEBSIELLA PNEUMONIAE (A)  Final      Susceptibility   Klebsiella pneumoniae - MIC*    AMPICILLIN >=32 RESISTANT Resistant     CEFAZOLIN <=4 SENSITIVE Sensitive     CEFTRIAXONE <=1 SENSITIVE Sensitive     CIPROFLOXACIN >=4 RESISTANT Resistant     GENTAMICIN <=1 SENSITIVE Sensitive     IMIPENEM <=0.25 SENSITIVE Sensitive     NITROFURANTOIN 256 RESISTANT Resistant     TRIMETH/SULFA 40 SENSITIVE Sensitive     AMPICILLIN/SULBACTAM 8 SENSITIVE Sensitive     PIP/TAZO <=4 SENSITIVE Sensitive     Extended ESBL NEGATIVE Sensitive     * >=100,000 COLONIES/mL KLEBSIELLA PNEUMONIAE  MRSA PCR Screening     Status: None   Collection Time: 11/09/17  3:42 AM  Result Value Ref Range Status   MRSA by PCR NEGATIVE NEGATIVE Final    Comment:        The GeneXpert MRSA Assay (FDA approved for NASAL specimens only), is one component of a comprehensive MRSA colonization surveillance program. It is not intended to diagnose MRSA infection nor to guide or monitor treatment for MRSA infections.     Renal Function: Recent Labs    11/08/17 2255 11/09/17 0354 11/10/17 0328 11/11/17 0321  CREATININE 1.85* 1.37* 1.17 1.08   Estimated Creatinine Clearance: 99 mL/min (by C-G formula based on SCr of 1.08 mg/dL).  Radiologic Imaging: Dg Chest Port 1 View  Result Date: 11/10/2017 CLINICAL DATA:  Respiratory distress EXAM: PORTABLE CHEST  1 VIEW COMPARISON:  11/09/2017 FINDINGS: Support devices are unchanged. Low volumes with bibasilar atelectasis, improving since prior study. Heart is borderline in size. IMPRESSION: Improving bibasilar atelectasis. Electronically Signed   By: Rolm Baptise M.D.   On: 11/10/2017 07:05    I independently reviewed the above imaging studies.  Impression/Recommendation 1.  Right hydronephrosis with possible renal stone seen on renal ultrasound from 121/2018 2.  History of neurogenic bladder managed with suprapubic catheter  3.  Urosepsis- with Klebsiella and pseudomonal urinary tract infection/bacteremia.  Currently on IV Zosyn  4.  Quadriplegia  -CT stone study tonight to evaluate for a possible distal stone as the  source of his hydronephrosis.  Will make a decision on further intervention pending the results of the CT scan -Recommend exchanging his suprapubic catheter   Ellison Hughs, MD [facility] 11/11/2017, 6:45 PM    CC: Dutch Gray, MD

## 2017-11-11 NOTE — Consult Note (Signed)
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 Healed sacral pressure injury and numerous other areas where there are scars indicating healing of full thickness wounds. Wound type:Pressure, neuropathic  Pressure Injury POA: Yes Measurement: Right IT:  3cm x 5cm with depth obscured by the presence of nonviable tissue (slough) Left IT:  2cm x 3cm with depth obscured by the presence of non-viable tissue (slough) Sacrum:  Healed full thickness PrI Left buttocks:  0.4cm x 2cm x 0.1cm partial thickness tissue loss secondary to medical adhesive use (MARSI) Left lateral foot:  2cm x 1cm x 1cm with non-viable tissue in wound base (slough)  Wound bed: All: unable to visualize wound bed (unstageable) Drainage (amount, consistency, odor) small amounts from bilateral ITs, more moderate from left foot, but dressing had not been change in a couple of days. Periwound:intact, dry and with evidence of previous wound healing Dressing procedure/placement/frequency: I will implement an enzymatic debridement of the bilateral ischial tuberosities and the left lateral foot using collagenase. Patient is in his wheelchair most of the time at his facility and states he has a good cushion. We review that, despite a good surface, periodic shifting and boosting is required to prevent pressure injuries in the sitting position. He indicates understanding. We will keep HOB at or below a 30 degree angle while here and turn side to side-avoiding and minimizing time spent in the supine position. The heel boots he has are sufficient, topical wound care other than a foam is required.  [place] nursing team will not follow, but will remain available to this patient, the nursing and medical teams.  Please re-consult if needed. Thanks, Maudie Flakes, MSN, RN, [place], Arther Abbott  Pager# [phone]

## 2017-11-11 NOTE — Treatment Plan (Addendum)
CT stone protocol reveals two right sided ureteral stones, 1.5cm proximal and 74mm distal. No evidence of left sided stone or hydronephrosis.   Patient seen and evaluated by urology this afternoon. Normotensive.   Will plan for cystoscopy,stent placement tomorrow and exchange for suprapubic tube.   Please contact the urology on call if the patient clinically decompensates.   Plan discussed with Dr.Winter. Plan also communicated to Dr. Oletta Darter w/ [facility]

## 2017-11-12 ENCOUNTER — Inpatient Hospital Stay (HOSPITAL_COMMUNITY): Payer: Medicare Other

## 2017-11-12 ENCOUNTER — Inpatient Hospital Stay (HOSPITAL_COMMUNITY): Payer: Medicare Other | Admitting: Certified Registered Nurse Anesthetist

## 2017-11-12 ENCOUNTER — Encounter (HOSPITAL_COMMUNITY)
Admission: EM | Disposition: A | Discharge status: Skilled Nursing Facility | Payer: Self-pay | Source: Home / Self Care | Attending: Pulmonary Disease

## 2017-11-12 ENCOUNTER — Encounter (HOSPITAL_COMMUNITY): Payer: Self-pay | Admitting: Certified Registered Nurse Anesthetist

## 2017-11-12 DIAGNOSIS — Z9359 Other cystostomy status: Secondary | ICD-10-CM

## 2017-11-12 DIAGNOSIS — R7881 Bacteremia: Secondary | ICD-10-CM

## 2017-11-12 DIAGNOSIS — F17210 Nicotine dependence, cigarettes, uncomplicated: Secondary | ICD-10-CM

## 2017-11-12 DIAGNOSIS — N132 Hydronephrosis with renal and ureteral calculous obstruction: Secondary | ICD-10-CM

## 2017-11-12 DIAGNOSIS — G8220 Paraplegia, unspecified: Secondary | ICD-10-CM

## 2017-11-12 HISTORY — PX: CYSTOSCOPY W/ URETERAL STENT PLACEMENT: SHX1429

## 2017-11-12 LAB — CBC
HCT: 27.1 % — ABNORMAL LOW (ref 39.0–52.0)
HEMOGLOBIN: 8.5 g/dL — AB (ref 13.0–17.0)
MCH: 28.6 pg (ref 26.0–34.0)
MCHC: 31.4 g/dL (ref 30.0–36.0)
MCV: 91.2 fL (ref 78.0–100.0)
PLATELETS: 348 10*3/uL (ref 150–400)
RBC: 2.97 MIL/uL — ABNORMAL LOW (ref 4.22–5.81)
RDW: 14.9 % (ref 11.5–15.5)
WBC: 9.1 10*3/uL (ref 4.0–10.5)

## 2017-11-12 LAB — CULTURE, BLOOD (ROUTINE X 2)
SPECIAL REQUESTS: ADEQUATE
Special Requests: ADEQUATE

## 2017-11-12 LAB — GLUCOSE, CAPILLARY
GLUCOSE-CAPILLARY: 87 mg/dL (ref 65–99)
GLUCOSE-CAPILLARY: 103 mg/dL — AB (ref 65–99)
GLUCOSE-CAPILLARY: 153 mg/dL — AB (ref 65–99)
GLUCOSE-CAPILLARY: 215 mg/dL — AB (ref 65–99)
Glucose-Capillary: 100 mg/dL — ABNORMAL HIGH (ref 65–99)

## 2017-11-12 LAB — BASIC METABOLIC PANEL
ANION GAP: 8 (ref 5–15)
BUN: 7 mg/dL (ref 6–20)
CALCIUM: 8.7 mg/dL — AB (ref 8.9–10.3)
CO2: 25 mmol/L (ref 22–32)
CREATININE: 1.12 mg/dL (ref 0.61–1.24)
Chloride: 109 mmol/L (ref 101–111)
GFR calc Af Amer: >60 mL/min (ref >60)
GLUCOSE: 93 mg/dL (ref 65–99)
Potassium: 3.5 mmol/L (ref 3.5–5.1)
Sodium: 142 mmol/L (ref 135–145)

## 2017-11-12 SURGERY — CYSTOSCOPY, WITH RETROGRADE PYELOGRAM AND URETERAL STENT INSERTION
Anesthesia: General | Laterality: Right

## 2017-11-12 MED ORDER — FENTANYL CITRATE (PF) 100 MCG/2ML IJ SOLN
INTRAMUSCULAR | Status: AC
  Filled 2017-11-12: qty 2

## 2017-11-12 MED ORDER — LACTATED RINGERS IV SOLN
INTRAVENOUS | Status: DC | PRN
  Administered 2017-11-12: 13:00:00 via INTRAVENOUS

## 2017-11-12 MED ORDER — ESMOLOL HCL 100 MG/10ML IV SOLN
INTRAVENOUS | Status: DC | PRN
  Administered 2017-11-12: 30 mg via INTRAVENOUS

## 2017-11-12 MED ORDER — LABETALOL HCL 5 MG/ML IV SOLN
INTRAVENOUS | Status: AC
  Filled 2017-11-12: qty 4

## 2017-11-12 MED ORDER — PIPERACILLIN-TAZOBACTAM 3.375 G IVPB
INTRAVENOUS | Status: AC
  Filled 2017-11-12: qty 50

## 2017-11-12 MED ORDER — FENTANYL CITRATE (PF) 100 MCG/2ML IJ SOLN
INTRAMUSCULAR | Status: DC | PRN
  Administered 2017-11-12 (×3): 50 ug via INTRAVENOUS

## 2017-11-12 MED ORDER — MIDAZOLAM HCL 2 MG/2ML IJ SOLN
INTRAMUSCULAR | Status: AC
  Filled 2017-11-12: qty 2

## 2017-11-12 MED ORDER — MIDAZOLAM HCL 5 MG/5ML IJ SOLN
INTRAMUSCULAR | Status: DC | PRN
  Administered 2017-11-12 (×2): 1 mg via INTRAVENOUS

## 2017-11-12 MED ORDER — SODIUM CHLORIDE 0.9 % IR SOLN
Status: DC | PRN
  Administered 2017-11-12: 3000 mL via INTRAVESICAL

## 2017-11-12 MED ORDER — IOHEXOL 300 MG/ML  SOLN
INTRAMUSCULAR | Status: DC | PRN
  Administered 2017-11-12: 50 mL via URETHRAL

## 2017-11-12 MED ORDER — LIDOCAINE 2% (20 MG/ML) 5 ML SYRINGE
INTRAMUSCULAR | Status: DC | PRN
  Administered 2017-11-12: 100 mg via INTRAVENOUS

## 2017-11-12 MED ORDER — ONDANSETRON HCL 4 MG/2ML IJ SOLN
INTRAMUSCULAR | Status: DC | PRN
  Administered 2017-11-12: 4 mg via INTRAVENOUS

## 2017-11-12 MED ORDER — SODIUM CHLORIDE 0.9 % IV SOLN
1.5000 g | Freq: Three times a day (TID) | INTRAVENOUS | Status: DC
  Administered 2017-11-12 – 2017-11-14 (×6): 1.5 g via INTRAVENOUS
  Filled 2017-11-12 (×8): qty 11.4

## 2017-11-12 MED ORDER — GENTAMICIN SULFATE 40 MG/ML IJ SOLN
420.0000 mg | INTRAVENOUS | Status: DC
  Administered 2017-11-12: 420 mg via INTRAVENOUS
  Filled 2017-11-12 (×2): qty 10.5

## 2017-11-12 MED ORDER — PROPOFOL 10 MG/ML IV BOLUS
INTRAVENOUS | Status: AC
  Filled 2017-11-12: qty 40

## 2017-11-12 MED ORDER — DEXAMETHASONE SODIUM PHOSPHATE 10 MG/ML IJ SOLN
INTRAMUSCULAR | Status: AC
  Filled 2017-11-12: qty 1

## 2017-11-12 MED ORDER — PROPOFOL 10 MG/ML IV BOLUS
INTRAVENOUS | Status: DC | PRN
Start: 2017-11-12 — End: 2017-11-12
  Administered 2017-11-12: 150 mg via INTRAVENOUS

## 2017-11-12 MED ORDER — DEXAMETHASONE SODIUM PHOSPHATE 4 MG/ML IJ SOLN
INTRAMUSCULAR | Status: DC | PRN
  Administered 2017-11-12: 10 mg via INTRAVENOUS

## 2017-11-12 MED ORDER — FENTANYL CITRATE (PF) 100 MCG/2ML IJ SOLN: 25.0000 ug | INTRAMUSCULAR | Status: DC | PRN

## 2017-11-12 MED ORDER — ONDANSETRON HCL 4 MG/2ML IJ SOLN
INTRAMUSCULAR | Status: AC
  Filled 2017-11-12: qty 2

## 2017-11-12 MED ORDER — LABETALOL HCL 5 MG/ML IV SOLN
INTRAVENOUS | Status: DC | PRN
  Administered 2017-11-12 (×2): 5 mg via INTRAVENOUS

## 2017-11-12 MED ORDER — ESMOLOL HCL 100 MG/10ML IV SOLN
INTRAVENOUS | Status: AC
  Filled 2017-11-12: qty 10

## 2017-11-12 SURGICAL SUPPLY — 25 items
BAG URO CATCHER STRL LF (MISCELLANEOUS) ×3 IMPLANT
BASKET ZERO TIP NITINOL 2.4FR (BASKET) ×1 IMPLANT
BSKT STON RTRVL ZERO TP 2.4FR (BASKET)
CATH FOLEY 2W COUNCIL 20FR 5CC (CATHETERS) ×2 IMPLANT
CATH INTERMIT  6FR 70CM (CATHETERS) ×3 IMPLANT
CLOTH BEACON ORANGE TIMEOUT ST (SAFETY) ×3 IMPLANT
COVER FOOTSWITCH UNIV (MISCELLANEOUS) ×1 IMPLANT
COVER SURGICAL LIGHT HANDLE (MISCELLANEOUS) ×1 IMPLANT
DRAPE C-ARM 42X120 X-RAY (DRAPES) ×3 IMPLANT
EXTRACTOR STONE NITINOL NGAGE (UROLOGICAL SUPPLIES) IMPLANT
FIBER LASER FLEXIVA 365 (UROLOGICAL SUPPLIES) ×1 IMPLANT
FIBER LASER TRAC TIP (UROLOGICAL SUPPLIES) ×1 IMPLANT
GLOVE BIOGEL M STRL SZ7.5 (GLOVE) ×3 IMPLANT
GOWN STRL REUS W/TWL LRG LVL3 (GOWN DISPOSABLE) ×4 IMPLANT
GOWN STRL REUS W/TWL XL LVL3 (GOWN DISPOSABLE) ×1 IMPLANT
GUIDEWIRE ANG ZIPWIRE 038X150 (WIRE) ×2 IMPLANT
GUIDEWIRE STR DUAL SENSOR (WIRE) ×3 IMPLANT
MANIFOLD NEPTUNE II (INSTRUMENTS) ×3 IMPLANT
PACK CYSTO (CUSTOM PROCEDURE TRAY) ×3 IMPLANT
SHEATH URETERAL 12FRX35CM (MISCELLANEOUS) ×1 IMPLANT
STENT URET 6FRX26 CONTOUR (STENTS) ×2 IMPLANT
SYRINGE IRR TOOMEY STRL 70CC (SYRINGE) ×2 IMPLANT
TUBING CONNECTING 10 (TUBING) ×2 IMPLANT
TUBING CONNECTING 10' (TUBING) ×1
WATER STERILE IRR 1000ML POUR (IV SOLUTION) ×2 IMPLANT

## 2017-11-12 NOTE — Transfer of Care (Signed)
Immediate Anesthesia Transfer of Care Note  Patient: Phillip [name]  Procedure(s) Performed: CYSTOSCOPY WITH RETROGRADE PYELOGRAM/URETERAL STENT PLACEMENT (Right )  Patient Location: PACU  Anesthesia Type:General  Level of Consciousness: awake, alert  and oriented  Airway & Oxygen Therapy: Patient Spontanous Breathing and Patient connected to face mask oxygen  Post-op Assessment: Report given to RN and Post -op Vital signs reviewed and stable  Post vital signs: Reviewed and stable  Last Vitals:  Vitals:   11/12/17 1000 11/12/17 1200  BP:    Pulse:    Resp: (!) 28   Temp:  37.3 C  SpO2:  99%    Last Pain:  Vitals:   11/12/17 1200  TempSrc: Oral  PainSc:          Complications: No apparent anesthesia complications

## 2017-11-12 NOTE — Progress Notes (Signed)
Subjective: Pt c/o of intermittent right flank pain overnight.  He denies N/V or chills this AM.  CT noted.  Objective: Vital signs in last 24 hours: Temp:  [98.3 F (36.8 C)-101.5 F (38.6 C)] 101.5 F (38.6 C) (12/04 0800) Pulse Rate:  [91-110] 109 (12/04 0404) Resp:  [19-38] 32 (12/04 0404) BP: (116-178)/(73-109) 175/102 (12/04 0404) SpO2:  [91 %-100 %] 96 % (12/04 0404)  Intake/Output from previous day: 12/03 0701 - 12/04 0700 In: 3520 [P.O.:720; I.V.:2750; IV Piggyback:50] Out: 2050 [Urine:2050]  Intake/Output this shift: No intake/output data recorded.  Physical Exam:  General: Alert and oriented CV: RRR, palpable distal pulses Lungs: CTAB, equal chest rise Abdomen: Soft, NTND, no rebound or guarding GU: SPT in place and draining yellow urine Ext: NT, No erythema  Lab Results: Recent Labs    11/10/17 0328 11/11/17 0321 11/12/17 0347  HGB 7.9* 8.1* 8.5*  HCT 24.7* 25.2* 27.1*   BMET Recent Labs    11/11/17 0321 11/12/17 0347  NA 139 142  K 3.7 3.5  CL 107 109  CO2 23 25  GLUCOSE 84 93  BUN 8 7  CREATININE 1.08 1.12  CALCIUM 8.2* 8.7*     Studies/Results: Ct Abdomen Pelvis Wo Contrast  Addendum Date: 11/12/2017   ADDENDUM REPORT: 11/12/2017 05:14 ADDENDUM: These results were called by telephone at the time of interpretation on 11/11/2017 at 10:19 p.m. to Dr. Corinna Lines, who verbally acknowledged these results. Electronically Signed   By: Fidela Salisbury M.D.   On: 11/12/2017 05:14   Result Date: 11/12/2017 CLINICAL DATA:  Recurrent urinary tract infections. Increase creatinine. Right hydronephrosis found on recent abdominal ultrasound. EXAM: CT ABDOMEN AND PELVIS WITHOUT CONTRAST TECHNIQUE: Multidetector CT imaging of the abdomen and pelvis was performed following the standard protocol without IV contrast. COMPARISON:  Ultrasound 11/09/2017 FINDINGS: Lower chest: Hyperventilatory changes of bilateral lung bases. Small bilateral pleural effusions.  Small pericardial effusion, maximum thickness of 12 mm posteriorly. Calcific atherosclerotic disease of the coronary arteries. Hepatobiliary: No focal liver abnormality is seen. No gallstones, gallbladder wall thickening, or biliary dilatation. Pancreas: Unremarkable. No pancreatic ductal dilatation or surrounding inflammatory changes. Spleen: Normal in size without focal abnormality. Adrenals/Urinary Tract: Normal adrenal glands. 1.5 cm obstructive calculus in the proximal right ureter, causing moderate right hydronephrosis. Other 2 large calculi are seen within the right renal pelvis, the larger measuring 15 mm. There also other nonobstructive renal calculi within the right renal parenchyma, with a cluster of calculi within the lower pole of the right kidney measuring 10 mm. There is a mild right perinephric renal stranding. Additionally, in the distal right ureter there is an 8 mm calculus, approximately 1 cm from the vesicoureteral junction. No significant upstream ureteral dilation is seen, suggesting that this is a nonobstructive calculus. There are small nonobstructive calculi in the left kidney, the largest measuring 5 mm. No evidence of left hydroureter. There is a suprapubic catheter. The urinary bladder is collapsed around it and therefore suboptimally evaluated, with symmetric mucosal thickening. Stomach/Bowel: Stomach is within normal limits. Appendix appears normal. No evidence of bowel wall thickening, distention, or inflammatory changes. Scattered colonic diverticulosis. Vascular/Lymphatic: Aortic atherosclerosis. No enlarged abdominal or pelvic lymph nodes. Reproductive: Prostate is unremarkable. Other: No abdominal wall hernia or abnormality. No abdominopelvic ascites. Musculoskeletal: Post right femoral pinning. Posttraumatic calcifications about the left hip. Small scattered lytic lesions in the left ilium. IMPRESSION: 1. 1.5 cm obstructive calculus in the proximal right ureter causing moderate  right hydronephrosis. Additional large right renal calculi  within the right renal pelvis and parenchyma. 2. 8 mm right distal ureteral calculus, without associated upstream ureteral dilation, suggesting that this is a nonobstructive calculus. 3. Left nephrolithiasis without evidence of hydronephrosis or hydroureter. 4. Diffuse thickening of the urinary bladder wall, decompressed around suprapubic catheter and therefore poorly evaluated. 5. Subcentimeter lytic lesions in the left ilium, nonspecific. Please correlate to clinical history regarding malignancy capable of producing lytic lesions. 6. Small pericardial effusion. 7. Small bilateral pleural effusions. Bilateral lower lobe atelectasis. Infectious airspace consolidation is considered less likely. 8. Calcific atherosclerotic disease of the coronary arteries and aorta. Electronically Signed: By: Fidela Salisbury M.D. On: 11/11/2017 21:48    Assessment/Plan: 1.  Obstructing right ureteral stones with hydronephrosis and non-obstructing left renal stone 2.  History of neurogenic bladder managed with suprapubic catheter  3.  Urosepsis- with Klebsiella and pseudomonal urinary tract infection/bacteremia.  Currently on IV Zosyn  4.  Quadriplegia  -The risks, benefits and alternatives of cystoscopy with left ureteral stent placement was discussed with the patient.  He voices understanding and wishes to proceed.        LOS: 3 days   Ellison Hughs, MD 11/12/2017, 8:50 AM  [facility] Pager: [phone]

## 2017-11-12 NOTE — Interval H&P Note (Signed)
History and Physical Interval Note:  11/12/2017 12:22 PM  Phillip [name]  has presented today for surgery, with the diagnosis of Hydronephrosis  The various methods of treatment have been discussed with the patient and family. After consideration of risks, benefits and other options for treatment, the patient has consented to  Procedure(s): CYSTOSCOPY WITH STENT PLACEMENT (Right) CYSTOSCOPY/URETEROSCOPY/HOLMIUM LASER/STENT PLACEMENT (Right) as a surgical intervention .  The patient's history has been reviewed, patient examined, no change in status, stable for surgery.  I have reviewed the patient's chart and labs.  Questions were answered to the patient's satisfaction.     Conception Oms Winter

## 2017-11-12 NOTE — Op Note (Signed)
Operative Note  Preoperative diagnosis:  1.  Urosepsis 2.  Obstructing proximal and distal right ureteral calculi 3. Paraplegia 4. Neurogenic bladder  Postoperative diagnosis: 1.  Urosepsis 2.  Obstructing proximal and distal right ureteral calculi 3.  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  Paraplegia 5.  Neurogenic bladder  Procedure(s): 1.  Cystoscopy 2.  Right retrograde pyelogram with intraoperative interpretation of fluoroscopic imaging 3.  Right 6 French JJ stent placement 4.  20 French suprapubic catheter exchange  Surgeon: Ellison Hughs, MD  Assistants:  None  Anesthesia:  Gen. endotracheal  Complications:  None  EBL:  Less than 5 mL  Specimens: 1.  Purulent fluid from right kidney was sent for culture and sensitivity  Drains/Catheters: 1.  Right 6 French by 26 cm JJ stent 2.  20 French suprapubic catheter with 10 mL sterile water in the balloon  Intraoperative findings:   1.  High bladder neck making rigid cystoscopy difficult 2.  Right ureteral stricture/stenosis was seen on right retrograde pyelogram the level of L4 3.  A large amount of purulent fluid was expressed following wire manipulation of his proximal ureteral stone and a alleviation of his ureteral obstruction 4.  Right JJ stent in good position on fluoroscopy 5.  Suprapubic catheter freely flushing at the conclusion of the case  Indication:  Phillip [name] is a 51 y.o. male with urosepsis secondary to a proximal and distal obstructing right ureteral calculi. The patient has a history of paraplegia following a spinal cord injury several years ago that has left him with a neurogenic bladder that is managed with an indwelling suprapubic catheter. The patient has been consented for the above procedures, voices understanding and wishes to proceed.  Description of procedure:  After informed consent was obtained, the patient was brought to the operating room and general  LMA anesthesia was administered. The patient's previously placed suprapubic catheter was then exchanged and clamped. The patient was then placed in the dorsolithotomy position and prepped and draped in usual sterile fashion. A timeout was performed. A 21 French rigid cystoscope was then inserted into the urethral meatus and advanced into the bladder under direct vision. The patient was noted to have a very high and rigid bladder neck, making rigid cystoscopy difficult. A complete bladder survey revealed no intravesical pathology. The right ureteral orifice was identified and a 6 [place] open-ended catheter was advanced into its distal aspects.   A right retrograde pyelogram was obtained that showed a filling defect within the distal right ureter as well as stenosis/stricture of the ureter at the level of L4.  A Glidewire was then advanced through the lumen of the ureteral catheter and into the ureter until resistance was met at the previously mentioned area of stenosis. The ureteral catheter was then advanced up to the level of stenosis and extensive wire manipulation eventually bypassed the area of stricture and the wire was advanced up the right renal pelvis, under fluoroscopic guidance.   The ureteral catheter was then advanced over the wire and a pyelogram was obtained that showed significant dilation of the right renal pelvis and immediate expression of purulent urine. The Glidewire was then replaced through the lumen of the ureteral catheter, which was then removed over the wire. A 6 [place] JJ stent was then placed over the wire and into good position within the right collecting system, confirming placement via fluoroscopy. A urine specimen was sent for culture and sensitivity. The rigid cystoscope was then removed  under direct vision. His suprapubic catheter was then there is return of clear to light pink irrigant. His suprapubic catheter was then placed to gravity drainage. The patient tolerated the  procedure well and was transferred to the postanesthesia unit in stable condition.  Plan:  Continue IV antibiotic coverage with Zosyn. Urine culture and sensitivity from the operating room is pending. Flush suprapubic catheter as needed for decreased output or purulent/bloody urine. The patient will eventually need definitive stone treatment for his multiple large right-sided stones as well as a nonobstructing left renal stones in the coming weeks.

## 2017-11-12 NOTE — Consult Note (Addendum)
[facility] for Infectious Disease  Total days of antibiotics 5        Day 5 piptazo               Reason for Consult: complicated uti with bacteremia    Referring Physician: xu  Active Problems:   Severe sepsis ([place])   Acute hypoxemic respiratory failure (H[facility])   Acute respiratory failure (H[facility])    HPI: Phillip [name] is a 51 y.o. male who has SCI, incompleted quadriplegia, neurogenic bladder, SNF resident admitted on 12/1 for fevers, AMS, hypotension requiring intubation on admit for sepsis management. He was initially started on cefepime. Thought have severe sepsis due to urinary source. His blood cx grew kleb pneumonia but urinary cx also showed PsA plus kleb pneumonaie. His abd/pelvic CT showed that he had hydronephrosis with obstructed ureter. He was seen by urology to evaluate obstructing right ureteral stone and hydronephrosis plus has non-obstructive left stone. He is on day 5 of piptazo, has remained febrile x 4 days. Isolates show kleb ( R amp, R cipro), and PsA ( R cefepime, R ceftaz, I cipro, R imi) he went to the OR to decompress right kidney on 12/4. OR note suggests that he had pyelonephritis with purulent material expressed from kidney   Past Medical History:  Diagnosis Date  . Acute respiratory failure ([place]) 10/2017  . Ankle fracture, right 06/03/2015  . Broken hip ([place])   . Cervical spinal cord injury ([place])   . DJD (degenerative joint disease)   . Hip fracture requiring operative repair ([place]) 10/09/2014  . 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 Proteus septicemia ([place])   . Seizures ([place])   . Tobacco abuse     Allergies: No Known Allergies  Current antibiotics:   MEDICATIONS: . [MAR Hold] amitriptyline  25 mg Oral QHS  . [MAR Hold] atorvastatin  40 mg Oral Daily  . [MAR Hold] baclofen  10 mg Oral TID  . [MAR Hold] collagenase   Topical Daily  . [MAR Hold] gabapentin  300 mg Oral TID  . [MAR Hold] heparin  5,000 Units Subcutaneous Q8H   . [MAR Hold] insulin aspart  0-15 Units Subcutaneous TID WC  . [MAR Hold] insulin aspart  0-5 Units Subcutaneous QHS  . [MAR Hold] mouth rinse  15 mL Mouth Rinse BID  . [MAR Hold] metoprolol tartrate  12.5 mg Oral BID  . [MAR Hold] pantoprazole  40 mg Oral Daily    Social History   Tobacco Use  . Smoking status: Current Every Day Smoker    Packs/day: 0.25    Years: 0.20    Pack years: 0.05    Types: Cigarettes  . Smokeless tobacco: Never Used  . Tobacco comment: 07/17/16 smoking about 4 a day  Substance Use Topics  . Alcohol use: No    Comment: Pt has not drank since inj 2 weeks ago  . Drug use: No    Family History  Problem Relation Age of Onset  . Hypertension Mother     Review of Systems -  Still sedated  OBJECTIVE: Temp:  [99.1 F (37.3 C)-101.5 F (38.6 C)] 99.1 F (37.3 C) (12/04 1449) Pulse Rate:  [105-109] 107 (12/04 1500) Resp:  [23-38] 28 (12/04 1500) BP: (136-178)/(89-109) 166/106 (12/04 1500) SpO2:  [91 %-100 %] 100 % (12/04 1500) Patient in pacu  LABS: Results for orders placed or performed during the hospital encounter of 11/08/17 (from the past 48 hour(s))  Glucose, capillary  Status: None   Collection Time: 11/10/17  5:12 PM  Result Value Ref Range   Glucose-Capillary 94 65 - 99 mg/dL  Glucose, capillary     Status: None   Collection Time: 11/10/17  7:56 PM  Result Value Ref Range   Glucose-Capillary 92 65 - 99 mg/dL   Comment 1 Notify RN    Comment 2 Document in Chart   Glucose, capillary     Status: None   Collection Time: 11/10/17 11:04 PM  Result Value Ref Range   Glucose-Capillary 91 65 - 99 mg/dL   Comment 1 Notify RN    Comment 2 Document in Chart   Procalcitonin     Status: None   Collection Time: 11/11/17  3:21 AM  Result Value Ref Range   Procalcitonin 4.17 ng/mL    Comment:        Interpretation: PCT > 2 ng/mL: Systemic infection (sepsis) is likely, unless other causes are known. (NOTE)       Sepsis PCT Algorithm            Lower Respiratory Tract                                      Infection PCT Algorithm    ----------------------------     ----------------------------         PCT < 0.25 ng/mL                PCT < 0.10 ng/mL         Strongly encourage             Strongly discourage   discontinuation of antibiotics    initiation of antibiotics    ----------------------------     -----------------------------       PCT 0.25 - 0.50 ng/mL            PCT 0.10 - 0.25 ng/mL               OR       >80% decrease in PCT            Discourage initiation of                                            antibiotics      Encourage discontinuation           of antibiotics    ----------------------------     -----------------------------         PCT >= 0.50 ng/mL              PCT 0.26 - 0.50 ng/mL               AND       <80% decrease in PCT              Encourage initiation of                                             antibiotics       Encourage continuation           of antibiotics    ----------------------------     -----------------------------  PCT >= 0.50 ng/mL                  PCT > 0.50 ng/mL               AND         increase in PCT                  Strongly encourage                                      initiation of antibiotics    Strongly encourage escalation           of antibiotics                                     -----------------------------                                           PCT <= 0.25 ng/mL                                                 OR                                        > 80% decrease in PCT                                     Discontinue / Do not initiate                                             antibiotics   Phosphorus     Status: None   Collection Time: 11/11/17  3:21 AM  Result Value Ref Range   Phosphorus 2.6 2.5 - 4.6 mg/dL  Magnesium     Status: None   Collection Time: 11/11/17  3:21 AM  Result Value Ref Range   Magnesium 1.8 1.7 - 2.4 mg/dL  Basic  metabolic panel     Status: Abnormal   Collection Time: 11/11/17  3:21 AM  Result Value Ref Range   Sodium 139 135 - 145 mmol/L   Potassium 3.7 3.5 - 5.1 mmol/L   Chloride 107 101 - 111 mmol/L   CO2 23 22 - 32 mmol/L   Glucose, Bld 84 65 - 99 mg/dL   BUN 8 6 - 20 mg/dL   Creatinine, Ser 1.08 0.61 - 1.24 mg/dL   Calcium 8.2 (L) 8.9 - 10.3 mg/dL   GFR calc non Af Amer >60 >60 mL/min   GFR calc Af Amer >60 >60 mL/min    Comment: (NOTE) The eGFR has been calculated using the CKD EPI equation. This calculation has not been validated in all clinical situations. eGFR's persistently <60 mL/min signify possible Chronic Kidney Disease.    Anion gap 9 5 -  15  CBC     Status: Abnormal   Collection Time: 11/11/17  3:21 AM  Result Value Ref Range   WBC 11.8 (H) 4.0 - 10.5 K/uL   RBC 2.76 (L) 4.22 - 5.81 MIL/uL   Hemoglobin 8.1 (L) 13.0 - 17.0 g/dL   HCT 25.2 (L) 39.0 - 52.0 %   MCV 91.3 78.0 - 100.0 fL   MCH 29.3 26.0 - 34.0 pg   MCHC 32.1 30.0 - 36.0 g/dL   RDW 15.2 11.5 - 15.5 %   Platelets 311 150 - 400 K/uL  Glucose, capillary     Status: None   Collection Time: 11/11/17  3:29 AM  Result Value Ref Range   Glucose-Capillary 73 65 - 99 mg/dL   Comment 1 Notify RN    Comment 2 Document in Chart   Glucose, capillary     Status: None   Collection Time: 11/11/17  8:53 AM  Result Value Ref Range   Glucose-Capillary 95 65 - 99 mg/dL  Glucose, capillary     Status: None   Collection Time: 11/11/17 12:48 PM  Result Value Ref Range   Glucose-Capillary 81 65 - 99 mg/dL   Comment 1 Notify RN    Comment 2 Document in Chart   Glucose, capillary     Status: None   Collection Time: 11/11/17  3:56 PM  Result Value Ref Range   Glucose-Capillary 73 65 - 99 mg/dL  Glucose, capillary     Status: None   Collection Time: 11/11/17  9:11 PM  Result Value Ref Range   Glucose-Capillary 95 65 - 99 mg/dL   Comment 1 Notify RN    Comment 2 Document in Chart   Basic metabolic panel     Status:  Abnormal   Collection Time: 11/12/17  3:47 AM  Result Value Ref Range   Sodium 142 135 - 145 mmol/L   Potassium 3.5 3.5 - 5.1 mmol/L   Chloride 109 101 - 111 mmol/L   CO2 25 22 - 32 mmol/L   Glucose, Bld 93 65 - 99 mg/dL   BUN 7 6 - 20 mg/dL   Creatinine, Ser 1.12 0.61 - 1.24 mg/dL   Calcium 8.7 (L) 8.9 - 10.3 mg/dL   GFR calc non Af Amer >60 >60 mL/min   GFR calc Af Amer >60 >60 mL/min    Comment: (NOTE) The eGFR has been calculated using the CKD EPI equation. This calculation has not been validated in all clinical situations. eGFR's persistently <60 mL/min signify possible Chronic Kidney Disease.    Anion gap 8 5 - 15  CBC     Status: Abnormal   Collection Time: 11/12/17  3:47 AM  Result Value Ref Range   WBC 9.1 4.0 - 10.5 K/uL   RBC 2.97 (L) 4.22 - 5.81 MIL/uL   Hemoglobin 8.5 (L) 13.0 - 17.0 g/dL   HCT 27.1 (L) 39.0 - 52.0 %   MCV 91.2 78.0 - 100.0 fL   MCH 28.6 26.0 - 34.0 pg   MCHC 31.4 30.0 - 36.0 g/dL   RDW 14.9 11.5 - 15.5 %   Platelets 348 150 - 400 K/uL  Glucose, capillary     Status: Abnormal   Collection Time: 11/12/17  8:10 AM  Result Value Ref Range   Glucose-Capillary 100 (H) 65 - 99 mg/dL   Comment 1 Notify RN    Comment 2 Document in Chart   Glucose, capillary     Status: Abnormal   Collection Time:  11/12/17  3:05 PM  Result Value Ref Range   Glucose-Capillary 103 (H) 65 - 99 mg/dL   Comment 1 Notify RN    Comment 2 Document in Chart     MICRO:  IMAGING: Ct Abdomen Pelvis Wo Contrast  Addendum Date: 11/12/2017   ADDENDUM REPORT: 11/12/2017 05:14 ADDENDUM: These results were called by telephone at the time of interpretation on 11/11/2017 at 10:19 p.m. to Dr. Corinna Lines, who verbally acknowledged these results. Electronically Signed   By: Fidela Salisbury M.D.   On: 11/12/2017 05:14   Result Date: 11/12/2017 CLINICAL DATA:  Recurrent urinary tract infections. Increase creatinine. Right hydronephrosis found on recent abdominal ultrasound. EXAM: CT  ABDOMEN AND PELVIS WITHOUT CONTRAST TECHNIQUE: Multidetector CT imaging of the abdomen and pelvis was performed following the standard protocol without IV contrast. COMPARISON:  Ultrasound 11/09/2017 FINDINGS: Lower chest: Hyperventilatory changes of bilateral lung bases. Small bilateral pleural effusions. Small pericardial effusion, maximum thickness of 12 mm posteriorly. Calcific atherosclerotic disease of the coronary arteries. Hepatobiliary: No focal liver abnormality is seen. No gallstones, gallbladder wall thickening, or biliary dilatation. Pancreas: Unremarkable. No pancreatic ductal dilatation or surrounding inflammatory changes. Spleen: Normal in size without focal abnormality. Adrenals/Urinary Tract: Normal adrenal glands. 1.5 cm obstructive calculus in the proximal right ureter, causing moderate right hydronephrosis. Other 2 large calculi are seen within the right renal pelvis, the larger measuring 15 mm. There also other nonobstructive renal calculi within the right renal parenchyma, with a cluster of calculi within the lower pole of the right kidney measuring 10 mm. There is a mild right perinephric renal stranding. Additionally, in the distal right ureter there is an 8 mm calculus, approximately 1 cm from the vesicoureteral junction. No significant upstream ureteral dilation is seen, suggesting that this is a nonobstructive calculus. There are small nonobstructive calculi in the left kidney, the largest measuring 5 mm. No evidence of left hydroureter. There is a suprapubic catheter. The urinary bladder is collapsed around it and therefore suboptimally evaluated, with symmetric mucosal thickening. Stomach/Bowel: Stomach is within normal limits. Appendix appears normal. No evidence of bowel wall thickening, distention, or inflammatory changes. Scattered colonic diverticulosis. Vascular/Lymphatic: Aortic atherosclerosis. No enlarged abdominal or pelvic lymph nodes. Reproductive: Prostate is unremarkable.  Other: No abdominal wall hernia or abnormality. No abdominopelvic ascites. Musculoskeletal: Post right femoral pinning. Posttraumatic calcifications about the left hip. Small scattered lytic lesions in the left ilium. IMPRESSION: 1. 1.5 cm obstructive calculus in the proximal right ureter causing moderate right hydronephrosis. Additional large right renal calculi within the right renal pelvis and parenchyma. 2. 8 mm right distal ureteral calculus, without associated upstream ureteral dilation, suggesting that this is a nonobstructive calculus. 3. Left nephrolithiasis without evidence of hydronephrosis or hydroureter. 4. Diffuse thickening of the urinary bladder wall, decompressed around suprapubic catheter and therefore poorly evaluated. 5. Subcentimeter lytic lesions in the left ilium, nonspecific. Please correlate to clinical history regarding malignancy capable of producing lytic lesions. 6. Small pericardial effusion. 7. Small bilateral pleural effusions. Bilateral lower lobe atelectasis. Infectious airspace consolidation is considered less likely. 8. Calcific atherosclerotic disease of the coronary arteries and aorta. Electronically Signed: By: Fidela Salisbury M.D. On: 11/11/2017 21:48   Dg C-arm 1-60 Min-no Report  Result Date: 11/12/2017 Fluoroscopy was utilized by the requesting physician.  No radiographic interpretation.    HISTORICAL MICRO/IMAGING  Assessment/Plan:  51yo M with SCI c/b neurogenic bladder admitted for sepsis due to complicated uti/pyelonephritis and secondary bacteremia  - repeat blood cx tonight to ensure not  bacteremic - will change his abtx to xerbaxa (to cover MDR PsA) plus gentamicin while hospitalized - will likely discharge on picc line with xerbaxa x 14 days using 12/5 as day 1. - will need pharmacy consult to manage gent dosing and avoid nephrotoxicity

## 2017-11-12 NOTE — Anesthesia Preprocedure Evaluation (Signed)
Anesthesia Evaluation  Patient identified by MRN, date of birth, ID band Patient awake    Reviewed: Allergy & Precautions, H&P , Patient's Chart, lab work & pertinent test results, reviewed documented beta blocker date and time   Airway Mallampati: II  TM Distance: >3 FB Neck ROM: full    Dental no notable dental hx.    Pulmonary Current Smoker,    Pulmonary exam normal breath sounds clear to auscultation       Cardiovascular  Rhythm:regular Rate:Normal     Neuro/Psych    GI/Hepatic   Endo/Other    Renal/GU      Musculoskeletal   Abdominal   Peds  Hematology  (+) anemia ,   Anesthesia Other Findings   Reproductive/Obstetrics                             Anesthesia Physical Anesthesia Plan  ASA: III  Anesthesia Plan: General   Post-op Pain Management:    Induction: Intravenous  PONV Risk Score and Plan: 1 and Dexamethasone, Ondansetron and Treatment may vary due to age or medical condition  Airway Management Planned: Oral ETT and Video Laryngoscope Planned  Additional Equipment:   Intra-op Plan:   Post-operative Plan: Extubation in OR  Informed Consent: I have reviewed the patients History and Physical, chart, labs and discussed the procedure including the risks, benefits and alternatives for the proposed anesthesia with the patient or authorized representative who has indicated his/her understanding and acceptance.   Dental Advisory Given  Plan Discussed with: CRNA and Surgeon  Anesthesia Plan Comments: (  )        Anesthesia Quick Evaluation                                   Anesthesia Evaluation  Patient identified by MRN, date of birth, ID band Patient awake    Reviewed: Allergy & Precautions, NPO status , Patient's Chart, lab work & pertinent test results, reviewed documented beta blocker date and time   History of Anesthesia Complications Negative for:  history of anesthetic complications  Airway Mallampati: III  TM Distance: >3 FB Neck ROM: Limited   Comment: Previous grade II view with MAC 3 Dental  (+) Dental Advisory Given,    Pulmonary neg shortness of breath, neg sleep apnea, neg COPD, neg recent URI, Current Smoker (smokes 4 cigarrettes a day),    Pulmonary exam normal breath sounds clear to auscultation       Cardiovascular hypertension, Pt. on medications and Pt. on home beta blockers (-) angina(-) Past MI, (-) Cardiac Stents and (-) CABG  Rhythm:Regular Rate:Normal  H/o autonomic dysreflexia  TTE 06/02/2016: Study Conclusions  - Left ventricle: The cavity size was normal. There was moderate   concentric hypertrophy. Systolic function was vigorous. The   estimated ejection fraction was in the range of 65% to 70%. Wall   motion was normal; there were no regional wall motion   abnormalities. Doppler parameters are consistent with abnormal   left ventricular relaxation (grade 1 diastolic dysfunction).   There was no evidence of elevated ventricular filling pressure by   Doppler parameters. - Aortic valve: Trileaflet; normal thickness leaflets. There was no   regurgitation. - Aortic root: The aortic root was normal in size. - Mitral valve: Structurally normal valve. There was no   regurgitation. - Right ventricle: The cavity size was normal.  Wall thickness was   normal. Systolic function was normal. - Tricuspid valve: There was mild regurgitation. - Pulmonary arteries: Systolic pressure was mildly increased. PA   peak pressure: 43 mm Hg (S). - Inferior vena cava: The vessel was normal in size. - Pericardium, extracardiac: There was no pericardial effusion.   Neuro/Psych Seizures -,  Spinal cord injury at C5-C7, quadriplegia    GI/Hepatic negative GI ROS, (+)     substance abuse  alcohol use,   Endo/Other  negative endocrine ROS  Renal/GU negative Renal ROS   Neurogenic bladder      Musculoskeletal  (+) Arthritis ,   Abdominal (+) + obese,   Peds  Hematology  (+) Blood dyscrasia, anemia ,   Anesthesia Other Findings   Reproductive/Obstetrics                            Anesthesia Physical Anesthesia Plan  ASA: III  Anesthesia Plan: General   Post-op Pain Management:    Induction: Intravenous  Airway Management Planned: Oral ETT  Additional Equipment:   Intra-op Plan:   Post-operative Plan: Extubation in OR  Informed Consent: I have reviewed the patients History and Physical, chart, labs and discussed the procedure including the risks, benefits and alternatives for the proposed anesthesia with the patient or authorized representative who has indicated his/her understanding and acceptance.   Dental advisory given  Plan Discussed with:   Anesthesia Plan Comments: (Risks of general anesthesia discussed including, but not limited to, sore throat, hoarse voice, chipped/damaged teeth, injury to vocal cords, nausea and vomiting, allergic reactions, lung infection, heart attack, stroke, and death. All questions answered. )       Anesthesia Quick Evaluation

## 2017-11-12 NOTE — Progress Notes (Signed)
Pharmacy Antibiotic Note  Phillip [name] is a 51 y.o. male with altered mental status admitted on 11/08/2017 with sepsis.  Pharmacy has been consulted for zosyn and vancomycin dosing.  Vancomycin stopped with isolation of K. Pneumoniae in blood.  K. Pneumoniae also in UCx along with a MDR pseudomonas aeruginosa.  ID consulted 12/4 and will change zosyn to zerbaxa and gentamicin.   Today, 11/12/2017  AKI from admission has resolved  WBC to WNL  Tm 101.5  Given gentamicin 580mg  x 1 12/2 at 02:17  Plan:  zerbaxa 1.5gm IV q8h  Gentamicin 420mg  IVPB q24h  Check random level 10h after dose  Monitor renal function closely  Per d/w ID, current plan will be zerbaxa + gent in hospital  Height: 5\' 11"  (180.3 cm) Weight: 212 lb 8.4 oz (96.4 kg) IBW/kg (Calculated) : 75.3  Temp (24hrs), Avg:99.9 F (37.7 C), Min:99.1 F (37.3 C), Max:101.5 F (38.6 C)  Recent Labs  Lab 11/08/17 2255 11/08/17 2310 11/09/17 0354 11/09/17 0745 11/10/17 0328 11/11/17 0321 11/12/17 0347  WBC 27.7*  --  18.3*  --  14.8* 11.8* 9.1  CREATININE 1.85*  --  1.37*  --  1.17 1.08 1.12  LATICACIDVEN  --  1.71 2.2* 1.6  --   --   --     Estimated Creatinine Clearance: 95.5 mL/min (by C-G formula based on SCr of 1.12 mg/dL).    No Known Allergies  Antimicrobials this admission: 12/1 cefepime >> x1 ED 12/1 zosyn >> 12/4 12/1 vancomycin >> 12/2 12/2 gent x 1 dose  12/4 zerbaxa >> 12/4 gent >>  Dose adjustments this admission:  Microbiology results: 11/30 BCx: GNR in both sets (BCID = Klebsiella pneumoniae. Called to black box early AM of 12/2 and suggested CTX. MD actually concerned abt elevated temp/WBC/hx of neurogenic bladder with recurrent UTI, ordered Gent x 1 with day team to decide on continuation of therapy. Later d/c'ed by Dr. Halford Chessman)  12/1 UCx: > 100K PsA (R to all except tob, gent, amikacin - called micro [R to pip/tazo])                  > Klebsiella pneumoniae (S zosyn) 12/1 MRSA PCR:  neg  Thank you for allowing pharmacy to be a part of this patient's care.  Doreene Eland, PharmD, BCPS.   Pager: [phone] 11/12/2017 4:08 PM

## 2017-11-12 NOTE — Progress Notes (Signed)
PROGRESS NOTE  Phillip [name] JIR:[id number] DOB: 1966/08/23 DOA: 11/08/2017 PCP: Patient, No Pcp Per  HPI/Recap of past 24 hours:  Npo, awaiting to OR for stent placement Currently denies pain  Assessment/Plan: Active Problems:   Severe sepsis ([place])   Acute hypoxemic respiratory failure ([facility])   Acute respiratory failure ([facility])  Severe sepsis /Severe metabolic encephalopathy/ AKI/from UTI/bacteremia Acute hypoxemic respiratory failure requiring mechanical Ventilation Has improved, extubated. Transferred to hospitalist service on 12/4.  Urine culture positive for Klebsiella pneumoniae and resistant Pseudomonas Blood culture positive for Klebsiella pneumoniae Infectious disease consulted, antibiotic per infectious disease  Obstructing right ureteral stones with hydronephrosis and non-obstructing left renal stone Urology consulted for cystoscope and stenting on 12/4.   neurogenic bladder managed with suprapubic catheter  Paraplegia -supportive treatment, continue home medications  Chronic, non-healing pressure injuries presented on admission: Wound care input appreciated  Per wound care  "Chronic, non-healing pressure injuries on the ischial tuberosities and a full thickness wound on the left lateral foot. Healed sacral pressure injury and numerous other areas where there are scars indicating healing of full thickness wounds. Wound type:Pressure, neuropathic  Pressure Injury POA: Yes Measurement: Right IT:  3cm x 5cm with depth obscured by the presence of nonviable tissue (slough) Left IT:  2cm x 3cm with depth obscured by the presence of non-viable tissue (slough) Sacrum:  Healed full thickness PrI Left buttocks:  0.4cm x 2cm x 0.1cm partial thickness tissue loss secondary to medical adhesive use (MARSI) Left lateral foot:  2cm x 1cm x 1cm with non-viable tissue in wound base (slough)  Wound bed: All: unable to visualize wound bed (unstageable) Drainage (amount,  consistency, odor) small amounts from bilateral ITs, more moderate from left foot, but dressing had not been change in a couple of days. Periwound:intact, dry and with evidence of previous wound healing Dressing procedure/placement/frequency: I will implement an enzymatic debridement of the bilateral ischial tuberosities and the left lateral foot using collagenase. Patient is in his wheelchair most of the time at his facility and states he has a good cushion. We review that, despite a good surface, periodic shifting and boosting is required to prevent pressure injuries in the sitting position. He indicates understanding. We will keep HOB at or below a 30 degree angle while here and turn side to side-avoiding and minimizing time spent in the supine position. The heel boots he has are sufficient, topical wound care other than a foam is required.   Anemia of chronic disease; no active bleed, no need of blood transfusion. Monitor  HTN -resume home medications HLD -continue statin     Code Status: full  Family Communication: patient   Disposition Plan: not ready for discharge   Consultants:  Admitted to critical care, transferred to hospitalist service on 12/4  Urology  Infectious disease  Procedures:  Intubation and extubation  Cystoscope on 12/4  Antibiotics:  Zerbaxa/gent currently   Objective: BP (!) 175/102   Pulse (!) 109   Temp (!) 101.5 F (38.6 C) (Oral)   Resp (!) 32   Ht 5\' 11"  (1.803 m)   Wt 96.4 kg (212 lb 8.4 oz)   SpO2 96%   BMI 29.64 kg/m   Intake/Output Summary (Last 24 hours) at 11/12/2017 0841 Last data filed at 11/12/2017 0500 Gross per 24 hour  Intake 3395 ml  Output 2050 ml  Net 1345 ml   Filed Weights   11/09/17 0424 11/10/17 0456 11/11/17 0500  Weight: 93.1 kg (205 lb 4 oz)  95.8 kg (211 lb 3.2 oz) 96.4 kg (212 lb 8.4 oz)    Exam: Patient is examined daily including today on 11/12/2017, exams remain the same as of yesterday except that  has changed    General:  NAD  Cardiovascular: RRR  Respiratory: diminished at basis, no wheezing, no rales  Abdomen: Soft/ND/NT, positive BS  Musculoskeletal: No Edema  Neuro: alert, oriented , chronic contractures of hands, paraplegia, + suprapubic catheter   Skin: pressure ulcers , detail please see wound care note.  Data Reviewed: Basic Metabolic Panel: Recent Labs  Lab 11/08/17 2255 11/09/17 0354 11/09/17 0936 11/09/17 1638 11/10/17 0328 11/11/17 0321 11/12/17 0347  NA 141 140  --   --  137 139 142  K 4.7 4.3  --   --  3.6 3.7 3.5  CL 106 110  --   --  106 107 109  CO2 25 20*  --   --  23 23 25   GLUCOSE 131* 139*  --   --  168* 84 93  BUN 25* 20  --   --  15 8 7   CREATININE 1.85* 1.37*  --   --  1.17 1.08 1.12  CALCIUM 9.1 8.2*  --   --  8.0* 8.2* 8.7*  MG  --  1.4* 2.5* 2.3 2.1 1.8  --   PHOS  --  3.3 2.3* 2.1* 1.9* 2.6  --    Liver Function Tests: Recent Labs  Lab 11/08/17 2255  AST 28  ALT 41  ALKPHOS 135*  BILITOT 1.4*  PROT 8.1  ALBUMIN 3.6   No results for input(s): LIPASE, AMYLASE in the last 168 hours. No results for input(s): AMMONIA in the last 168 hours. CBC: Recent Labs  Lab 11/08/17 2255 11/09/17 0354 11/10/17 0328 11/11/17 0321 11/12/17 0347  WBC 27.7* 18.3* 14.8* 11.8* 9.1  NEUTROABS 21.1*  --   --   --   --   HGB 8.3* 10.2* 7.9* 8.1* 8.5*  HCT 25.8* 32.2* 24.7* 25.2* 27.1*  MCV 92.5 93.6 92.5 91.3 91.2  PLT 405* 283 247 311 348   Cardiac Enzymes:   No results for input(s): CKTOTAL, CKMB, CKMBINDEX, TROPONINI in the last 168 hours. BNP (last 3 results) Recent Labs    09/13/17 0408  BNP 15.6    ProBNP (last 3 results) No results for input(s): PROBNP in the last 8760 hours.  CBG: Recent Labs  Lab 11/11/17 0853 11/11/17 1248 11/11/17 1556 11/11/17 2111 11/12/17 0810  GLUCAP 95 81 73 95 100*    Recent Results (from the past 240 hour(s))  Culture, blood (Routine x 2)     Status: Abnormal (Preliminary result)    Collection Time: 11/08/17 10:55 PM  Result Value Ref Range Status   Specimen Description BLOOD LEFT WRIST  Final   Special Requests IN PEDIATRIC BOTTLE Blood Culture adequate volume  Final   Culture  Setup Time   Final    GRAM NEGATIVE RODS IN PEDIATRIC BOTTLE CRITICAL VALUE NOTED.  VALUE IS CONSISTENT WITH PREVIOUSLY REPORTED AND CALLED VALUE. Performed at [facility], [place] [address], [place], [place] [address]    Culture KLEBSIELLA PNEUMONIAE (A)  Final   Report Status PENDING  Incomplete  Culture, blood (Routine x 2)     Status: Abnormal (Preliminary result)   Collection Time: 11/08/17 11:55 PM  Result Value Ref Range Status   Specimen Description BLOOD LEFT ANTECUBITAL  Final   Special Requests   Final    BOTTLES DRAWN  AEROBIC AND ANAEROBIC Blood Culture adequate volume   Culture  Setup Time   Final    GRAM NEGATIVE RODS ANAEROBIC BOTTLE ONLY CRITICAL RESULT CALLED TO, READ BACK BY AND VERIFIED WITH: B.GREEN,PHARMD AT 0108 ON 11/10/17 BY G.MCADOO    Culture (A)  Final    KLEBSIELLA PNEUMONIAE SUSCEPTIBILITIES TO FOLLOW Performed at [facility], [place] [address] [place], [place] [address]    Report Status PENDING  Incomplete  Blood Culture ID Panel (Reflexed)     Status: Abnormal   Collection Time: 11/08/17 11:55 PM  Result Value Ref Range Status   Enterococcus species NOT DETECTED NOT DETECTED Final   Listeria monocytogenes NOT DETECTED NOT DETECTED Final   Staphylococcus species NOT DETECTED NOT DETECTED Final   Staphylococcus aureus NOT DETECTED NOT DETECTED Final   Streptococcus species NOT DETECTED NOT DETECTED Final   Streptococcus agalactiae NOT DETECTED NOT DETECTED Final   Streptococcus pneumoniae NOT DETECTED NOT DETECTED Final   Streptococcus pyogenes NOT DETECTED NOT DETECTED Final   Acinetobacter baumannii NOT DETECTED NOT DETECTED Final   Enterobacteriaceae species DETECTED (A) NOT DETECTED Final    Comment: Enterobacteriaceae represent a large  family of gram-negative bacteria, not a single organism. CRITICAL RESULT CALLED TO, READ BACK BY AND VERIFIED WITH: B.GREEN,PHARMD AT 0108 ON 11/10/17 BY G.MCADOO    Enterobacter cloacae complex NOT DETECTED NOT DETECTED Final   Escherichia coli NOT DETECTED NOT DETECTED Final   Klebsiella oxytoca NOT DETECTED NOT DETECTED Final   Klebsiella pneumoniae DETECTED (A) NOT DETECTED Final    Comment: CRITICAL RESULT CALLED TO, READ BACK BY AND VERIFIED WITH: B.GREEN,PHARMD AT 0108 ON 11/10/17 BY G.MCADOO    Proteus species NOT DETECTED NOT DETECTED Final   Serratia marcescens NOT DETECTED NOT DETECTED Final   Carbapenem resistance NOT DETECTED NOT DETECTED Final   Haemophilus influenzae NOT DETECTED NOT DETECTED Final   Neisseria meningitidis NOT DETECTED NOT DETECTED Final   Pseudomonas aeruginosa NOT DETECTED NOT DETECTED Final   Candida albicans NOT DETECTED NOT DETECTED Final   Candida glabrata NOT DETECTED NOT DETECTED Final   Candida krusei NOT DETECTED NOT DETECTED Final   Candida parapsilosis NOT DETECTED NOT DETECTED Final   Candida tropicalis NOT DETECTED NOT DETECTED Final    Comment: Performed at [facility], [place]. [address], [place], [place] [address]  Urine culture     Status: Abnormal (Preliminary result)   Collection Time: 11/09/17  1:44 AM  Result Value Ref Range Status   Specimen Description URINE, CATHETERIZED  Final   Special Requests NONE  Final   Culture (A)  Final    >=100,000 COLONIES/mL PSEUDOMONAS AERUGINOSA >=100,000 COLONIES/mL KLEBSIELLA PNEUMONIAE REPEATING SENSITIVITIES FOR PSEUDOMONAS AERUGINOSA    Report Status PENDING  Incomplete   Organism ID, Bacteria KLEBSIELLA PNEUMONIAE (A)  Final      Susceptibility   Klebsiella pneumoniae - MIC*    AMPICILLIN >=32 RESISTANT Resistant     CEFAZOLIN <=4 SENSITIVE Sensitive     CEFTRIAXONE <=1 SENSITIVE Sensitive     CIPROFLOXACIN >=4 RESISTANT Resistant     GENTAMICIN <=1 SENSITIVE Sensitive     IMIPENEM  <=0.25 SENSITIVE Sensitive     NITROFURANTOIN 256 RESISTANT Resistant     TRIMETH/SULFA 40 SENSITIVE Sensitive     AMPICILLIN/SULBACTAM 8 SENSITIVE Sensitive     PIP/TAZO <=4 SENSITIVE Sensitive     Extended ESBL NEGATIVE Sensitive     * >=100,000 COLONIES/mL KLEBSIELLA PNEUMONIAE  MRSA PCR Screening  Status: None   Collection Time: 11/09/17  3:42 AM  Result Value Ref Range Status   MRSA by PCR NEGATIVE NEGATIVE Final    Comment:        The GeneXpert MRSA Assay (FDA approved for NASAL specimens only), is one component of a comprehensive MRSA colonization surveillance program. It is not intended to diagnose MRSA infection nor to guide or monitor treatment for MRSA infections.      Studies: Ct Abdomen Pelvis Wo Contrast  Addendum Date: 11/12/2017   ADDENDUM REPORT: 11/12/2017 05:14 ADDENDUM: These results were called by telephone at the time of interpretation on 11/11/2017 at 10:19 p.m. to Dr. Corinna Lines, who verbally acknowledged these results. Electronically Signed   By: Fidela Salisbury M.D.   On: 11/12/2017 05:14   Result Date: 11/12/2017 CLINICAL DATA:  Recurrent urinary tract infections. Increase creatinine. Right hydronephrosis found on recent abdominal ultrasound. EXAM: CT ABDOMEN AND PELVIS WITHOUT CONTRAST TECHNIQUE: Multidetector CT imaging of the abdomen and pelvis was performed following the standard protocol without IV contrast. COMPARISON:  Ultrasound 11/09/2017 FINDINGS: Lower chest: Hyperventilatory changes of bilateral lung bases. Small bilateral pleural effusions. Small pericardial effusion, maximum thickness of 12 mm posteriorly. Calcific atherosclerotic disease of the coronary arteries. Hepatobiliary: No focal liver abnormality is seen. No gallstones, gallbladder wall thickening, or biliary dilatation. Pancreas: Unremarkable. No pancreatic ductal dilatation or surrounding inflammatory changes. Spleen: Normal in size without focal abnormality. Adrenals/Urinary Tract:  Normal adrenal glands. 1.5 cm obstructive calculus in the proximal right ureter, causing moderate right hydronephrosis. Other 2 large calculi are seen within the right renal pelvis, the larger measuring 15 mm. There also other nonobstructive renal calculi within the right renal parenchyma, with a cluster of calculi within the lower pole of the right kidney measuring 10 mm. There is a mild right perinephric renal stranding. Additionally, in the distal right ureter there is an 8 mm calculus, approximately 1 cm from the vesicoureteral junction. No significant upstream ureteral dilation is seen, suggesting that this is a nonobstructive calculus. There are small nonobstructive calculi in the left kidney, the largest measuring 5 mm. No evidence of left hydroureter. There is a suprapubic catheter. The urinary bladder is collapsed around it and therefore suboptimally evaluated, with symmetric mucosal thickening. Stomach/Bowel: Stomach is within normal limits. Appendix appears normal. No evidence of bowel wall thickening, distention, or inflammatory changes. Scattered colonic diverticulosis. Vascular/Lymphatic: Aortic atherosclerosis. No enlarged abdominal or pelvic lymph nodes. Reproductive: Prostate is unremarkable. Other: No abdominal wall hernia or abnormality. No abdominopelvic ascites. Musculoskeletal: Post right femoral pinning. Posttraumatic calcifications about the left hip. Small scattered lytic lesions in the left ilium. IMPRESSION: 1. 1.5 cm obstructive calculus in the proximal right ureter causing moderate right hydronephrosis. Additional large right renal calculi within the right renal pelvis and parenchyma. 2. 8 mm right distal ureteral calculus, without associated upstream ureteral dilation, suggesting that this is a nonobstructive calculus. 3. Left nephrolithiasis without evidence of hydronephrosis or hydroureter. 4. Diffuse thickening of the urinary bladder wall, decompressed around suprapubic catheter and  therefore poorly evaluated. 5. Subcentimeter lytic lesions in the left ilium, nonspecific. Please correlate to clinical history regarding malignancy capable of producing lytic lesions. 6. Small pericardial effusion. 7. Small bilateral pleural effusions. Bilateral lower lobe atelectasis. Infectious airspace consolidation is considered less likely. 8. Calcific atherosclerotic disease of the coronary arteries and aorta. Electronically Signed: By: Fidela Salisbury M.D. On: 11/11/2017 21:48    Scheduled Meds: . amitriptyline  25 mg Oral QHS  . atorvastatin  40  mg Oral Daily  . baclofen  10 mg Oral TID  . collagenase   Topical Daily  . gabapentin  300 mg Oral TID  . heparin  5,000 Units Subcutaneous Q8H  . insulin aspart  0-15 Units Subcutaneous TID WC  . insulin aspart  0-5 Units Subcutaneous QHS  . mouth rinse  15 mL Mouth Rinse BID  . metoprolol tartrate  12.5 mg Oral BID  . pantoprazole  40 mg Oral Daily    Continuous Infusions: . sodium chloride 125 mL/hr at 11/12/17 0500  . piperacillin-tazobactam (ZOSYN)  IV 3.375 g (11/12/17 2707)     Time spent: 35 mins I have personally reviewed and interpreted on  11/12/2017 daily labs, tele strips, imagings as discussed above under date review session and assessment and plans.  I reviewed all nursing notes, pharmacy notes, consultant notes,  vitals, pertinent old records  I have discussed plan of care as described above with RN , patient and family on 11/12/2017   Florencia Reasons MD, PhD  [facility] Pager [phone]. If 7PM-7AM, please contact night-coverage at [email or web address], password [facility] 11/12/2017, 8:41 AM  LOS: 3 days

## 2017-11-12 NOTE — Anesthesia Procedure Notes (Signed)
Date/Time: 11/12/2017 2:17 PM Performed by: Glory Buff, CRNA Oxygen Delivery Method: Simple face mask

## 2017-11-12 NOTE — H&P (View-Only) (Signed)
Subjective: Pt c/o of intermittent right flank pain overnight.  He denies N/V or chills this AM.  CT noted.  Objective: Vital signs in last 24 hours: Temp:  [98.3 F (36.8 C)-101.5 F (38.6 C)] 101.5 F (38.6 C) (12/04 0800) Pulse Rate:  [91-110] 109 (12/04 0404) Resp:  [19-38] 32 (12/04 0404) BP: (116-178)/(73-109) 175/102 (12/04 0404) SpO2:  [91 %-100 %] 96 % (12/04 0404)  Intake/Output from previous day: 12/03 0701 - 12/04 0700 In: 3520 [P.O.:720; I.V.:2750; IV Piggyback:50] Out: 2050 [Urine:2050]  Intake/Output this shift: No intake/output data recorded.  Physical Exam:  General: Alert and oriented CV: RRR, palpable distal pulses Lungs: CTAB, equal chest rise Abdomen: Soft, NTND, no rebound or guarding GU: SPT in place and draining yellow urine Ext: NT, No erythema  Lab Results: Recent Labs    11/10/17 0328 11/11/17 0321 11/12/17 0347  HGB 7.9* 8.1* 8.5*  HCT 24.7* 25.2* 27.1*   BMET Recent Labs    11/11/17 0321 11/12/17 0347  NA 139 142  K 3.7 3.5  CL 107 109  CO2 23 25  GLUCOSE 84 93  BUN 8 7  CREATININE 1.08 1.12  CALCIUM 8.2* 8.7*     Studies/Results: Ct Abdomen Pelvis Wo Contrast  Addendum Date: 11/12/2017   ADDENDUM REPORT: 11/12/2017 05:14 ADDENDUM: These results were called by telephone at the time of interpretation on 11/11/2017 at 10:19 p.m. to Dr. Corinna Lines, who verbally acknowledged these results. Electronically Signed   By: Fidela Salisbury M.D.   On: 11/12/2017 05:14   Result Date: 11/12/2017 CLINICAL DATA:  Recurrent urinary tract infections. Increase creatinine. Right hydronephrosis found on recent abdominal ultrasound. EXAM: CT ABDOMEN AND PELVIS WITHOUT CONTRAST TECHNIQUE: Multidetector CT imaging of the abdomen and pelvis was performed following the standard protocol without IV contrast. COMPARISON:  Ultrasound 11/09/2017 FINDINGS: Lower chest: Hyperventilatory changes of bilateral lung bases. Small bilateral pleural effusions.  Small pericardial effusion, maximum thickness of 12 mm posteriorly. Calcific atherosclerotic disease of the coronary arteries. Hepatobiliary: No focal liver abnormality is seen. No gallstones, gallbladder wall thickening, or biliary dilatation. Pancreas: Unremarkable. No pancreatic ductal dilatation or surrounding inflammatory changes. Spleen: Normal in size without focal abnormality. Adrenals/Urinary Tract: Normal adrenal glands. 1.5 cm obstructive calculus in the proximal right ureter, causing moderate right hydronephrosis. Other 2 large calculi are seen within the right renal pelvis, the larger measuring 15 mm. There also other nonobstructive renal calculi within the right renal parenchyma, with a cluster of calculi within the lower pole of the right kidney measuring 10 mm. There is a mild right perinephric renal stranding. Additionally, in the distal right ureter there is an 8 mm calculus, approximately 1 cm from the vesicoureteral junction. No significant upstream ureteral dilation is seen, suggesting that this is a nonobstructive calculus. There are small nonobstructive calculi in the left kidney, the largest measuring 5 mm. No evidence of left hydroureter. There is a suprapubic catheter. The urinary bladder is collapsed around it and therefore suboptimally evaluated, with symmetric mucosal thickening. Stomach/Bowel: Stomach is within normal limits. Appendix appears normal. No evidence of bowel wall thickening, distention, or inflammatory changes. Scattered colonic diverticulosis. Vascular/Lymphatic: Aortic atherosclerosis. No enlarged abdominal or pelvic lymph nodes. Reproductive: Prostate is unremarkable. Other: No abdominal wall hernia or abnormality. No abdominopelvic ascites. Musculoskeletal: Post right femoral pinning. Posttraumatic calcifications about the left hip. Small scattered lytic lesions in the left ilium. IMPRESSION: 1. 1.5 cm obstructive calculus in the proximal right ureter causing moderate  right hydronephrosis. Additional large right renal calculi  within the right renal pelvis and parenchyma. 2. 8 mm right distal ureteral calculus, without associated upstream ureteral dilation, suggesting that this is a nonobstructive calculus. 3. Left nephrolithiasis without evidence of hydronephrosis or hydroureter. 4. Diffuse thickening of the urinary bladder wall, decompressed around suprapubic catheter and therefore poorly evaluated. 5. Subcentimeter lytic lesions in the left ilium, nonspecific. Please correlate to clinical history regarding malignancy capable of producing lytic lesions. 6. Small pericardial effusion. 7. Small bilateral pleural effusions. Bilateral lower lobe atelectasis. Infectious airspace consolidation is considered less likely. 8. Calcific atherosclerotic disease of the coronary arteries and aorta. Electronically Signed: By: Fidela Salisbury M.D. On: 11/11/2017 21:48    Assessment/Plan: 1.  Obstructing right ureteral stones with hydronephrosis and non-obstructing left renal stone 2.  History of neurogenic bladder managed with suprapubic catheter  3.  Urosepsis- with Klebsiella and pseudomonal urinary tract infection/bacteremia.  Currently on IV Zosyn  4.  Quadriplegia  -The risks, benefits and alternatives of cystoscopy with left ureteral stent placement was discussed with the patient.  He voices understanding and wishes to proceed.        LOS: 3 days   Ellison Hughs, MD 11/12/2017, 8:50 AM  [facility] Pager: [phone]

## 2017-11-12 NOTE — Consult Note (Signed)
[place] Nurse wound consult note Reason for Consult:Pressure injuries.  Patient consult received and performed yesterday.  See noted dated 11/11/17. Wound type:Pressure Pressure Injury POA: Yes Patient not seen today.  Orders provided for Nursing still appropriate.  [place] nursing team will not follow, but will remain available to this patient, the nursing and medical teams.  Please re-consult if needed. Thanks, Maudie Flakes, MSN, RN, [place], Arther Abbott  Pager# [phone]

## 2017-11-12 NOTE — Anesthesia Procedure Notes (Signed)
Procedure Name: Intubation Date/Time: 11/12/2017 1:29 PM Performed by: Claudia Desanctis, CRNA Pre-anesthesia Checklist: Emergency Drugs available, Suction available, Patient identified and Patient being monitored Patient Re-evaluated:Patient Re-evaluated prior to induction Oxygen Delivery Method: Circle system utilized Preoxygenation: Pre-oxygenation with 100% oxygen Induction Type: IV induction Ventilation: Mask ventilation without difficulty and Oral airway inserted - appropriate to patient size Laryngoscope Size: Glidescope and 4 Grade View: Grade I Tube type: Oral Tube size: 7.5 mm Number of attempts: 1 Airway Equipment and Method: Video-laryngoscopy Placement Confirmation: ETT inserted through vocal cords under direct vision,  positive ETCO2 and breath sounds checked- equal and bilateral Secured at: 23 cm Tube secured with: Tape Dental Injury: Teeth and Oropharynx as per pre-operative assessment

## 2017-11-13 ENCOUNTER — Encounter (HOSPITAL_COMMUNITY): Payer: Self-pay | Admitting: Urology

## 2017-11-13 DIAGNOSIS — N12 Tubulo-interstitial nephritis, not specified as acute or chronic: Secondary | ICD-10-CM

## 2017-11-13 DIAGNOSIS — A498 Other bacterial infections of unspecified site: Secondary | ICD-10-CM | POA: Diagnosis present

## 2017-11-13 DIAGNOSIS — N200 Calculus of kidney: Secondary | ICD-10-CM

## 2017-11-13 DIAGNOSIS — R7881 Bacteremia: Secondary | ICD-10-CM | POA: Diagnosis present

## 2017-11-13 DIAGNOSIS — N1330 Unspecified hydronephrosis: Secondary | ICD-10-CM

## 2017-11-13 DIAGNOSIS — B965 Pseudomonas (aeruginosa) (mallei) (pseudomallei) as the cause of diseases classified elsewhere: Secondary | ICD-10-CM

## 2017-11-13 LAB — GLUCOSE, CAPILLARY
GLUCOSE-CAPILLARY: 128 mg/dL — AB (ref 65–99)
Glucose-Capillary: 167 mg/dL — ABNORMAL HIGH (ref 65–99)
Glucose-Capillary: 188 mg/dL — ABNORMAL HIGH (ref 65–99)
Glucose-Capillary: 206 mg/dL — ABNORMAL HIGH (ref 65–99)

## 2017-11-13 LAB — URINE CULTURE
Culture: >=100000 — AB
Culture: NO GROWTH

## 2017-11-13 LAB — BASIC METABOLIC PANEL
Anion gap: 10 (ref 5–15)
BUN: 8 mg/dL (ref 6–20)
CO2: 22 mmol/L (ref 22–32)
Calcium: 8.9 mg/dL (ref 8.9–10.3)
Chloride: 104 mmol/L (ref 101–111)
Creatinine, Ser: 0.89 mg/dL (ref 0.61–1.24)
GFR calc Af Amer: >60 mL/min (ref >60)
GFR calc non Af Amer: >60 mL/min (ref >60)
Glucose, Bld: 167 mg/dL — ABNORMAL HIGH (ref 65–99)
Potassium: 4 mmol/L (ref 3.5–5.1)
Sodium: 136 mmol/L (ref 135–145)

## 2017-11-13 LAB — CBC WITH DIFFERENTIAL/PLATELET
Basophils Absolute: 0 10*3/uL (ref 0.0–0.1)
Basophils Relative: 0 %
EOS ABS: 0 10*3/uL (ref 0.0–0.7)
EOS PCT: 0 %
HCT: 29.3 % — ABNORMAL LOW (ref 39.0–52.0)
Hemoglobin: 9.5 g/dL — ABNORMAL LOW (ref 13.0–17.0)
LYMPHS ABS: 2 10*3/uL (ref 0.7–4.0)
Lymphocytes Relative: 20 %
MCH: 28.8 pg (ref 26.0–34.0)
MCHC: 32.4 g/dL (ref 30.0–36.0)
MCV: 88.8 fL (ref 78.0–100.0)
MONO ABS: 1 10*3/uL (ref 0.1–1.0)
MONOS PCT: 10 %
Neutro Abs: 6.8 10*3/uL (ref 1.7–7.7)
Neutrophils Relative %: 70 %
PLATELETS: 487 10*3/uL — AB (ref 150–400)
RBC: 3.3 MIL/uL — ABNORMAL LOW (ref 4.22–5.81)
RDW: 14.2 % (ref 11.5–15.5)
WBC: 9.8 10*3/uL (ref 4.0–10.5)

## 2017-11-13 LAB — MAGNESIUM: Magnesium: 1.9 mg/dL (ref 1.7–2.4)

## 2017-11-13 LAB — GENTAMICIN LEVEL, RANDOM: Gentamicin Rm: 3.9 ug/mL

## 2017-11-13 MED ORDER — HYDRALAZINE HCL 20 MG/ML IJ SOLN
10.0000 mg | Freq: Once | INTRAMUSCULAR | Status: AC
  Administered 2017-11-13: 10 mg via INTRAVENOUS
  Filled 2017-11-13: qty 1

## 2017-11-13 MED ORDER — FLEET ENEMA 7-19 GM/118ML RE ENEM
1.0000 | ENEMA | Freq: Every day | RECTAL | Status: DC | PRN
Start: 2017-11-13 — End: 2017-11-14

## 2017-11-13 MED ORDER — POLYETHYLENE GLYCOL 3350 17 G PO PACK
17.0000 g | PACK | Freq: Every day | ORAL | Status: DC
  Administered 2017-11-13: 17 g via ORAL
  Filled 2017-11-13: qty 1

## 2017-11-13 MED ORDER — HYDRALAZINE HCL 20 MG/ML IJ SOLN: 5.0000 mg | Freq: Four times a day (QID) | INTRAMUSCULAR | Status: DC | PRN

## 2017-11-13 MED ORDER — SODIUM CHLORIDE 0.9 % IV SOLN: INTRAVENOUS | Status: DC

## 2017-11-13 MED ORDER — SENNOSIDES-DOCUSATE SODIUM 8.6-50 MG PO TABS: 2.0000 | ORAL_TABLET | Freq: Two times a day (BID) | ORAL | Status: DC | PRN

## 2017-11-13 MED ORDER — GENTAMICIN SULFATE 40 MG/ML IJ SOLN
420.0000 mg | INTRAVENOUS | Status: DC
  Administered 2017-11-14: 420 mg via INTRAVENOUS
  Filled 2017-11-13: qty 10.5

## 2017-11-13 MED ORDER — CLONIDINE HCL 0.1 MG PO TABS
0.1000 mg | ORAL_TABLET | Freq: Three times a day (TID) | ORAL | Status: DC
  Administered 2017-11-13 – 2017-11-14 (×5): 0.1 mg via ORAL
  Filled 2017-11-13 (×5): qty 1

## 2017-11-13 MED ORDER — BACLOFEN 20 MG PO TABS
30.0000 mg | ORAL_TABLET | Freq: Three times a day (TID) | ORAL | Status: DC
  Administered 2017-11-13 – 2017-11-14 (×4): 30 mg via ORAL
  Filled 2017-11-13 (×3): qty 1

## 2017-11-13 NOTE — Progress Notes (Signed)
[facility] for Infectious Disease    Date of Admission:  11/08/2017   Total days of antibiotics 6        Day 2 ceftolozane        Day 2 gent          ID: Phillip [name] is a 51 y.o. male with  Partial quadraplegia with sepsis of urinary source (ur cx with PsA, and kleb pneumonaie), with secondary kleb bacteremia. Imaging showed stone obstructing right ureter with pyelonephritis. He is pod #1: 1.  Cystoscopy 2.  Right retrograde pyelogram with intraoperative interpretation of fluoroscopic imaging 3.  Right 6 French JJ stent placement 4.  20 French suprapubic catheter exchange   Active Problems:   Severe sepsis ([place])   Acute hypoxemic respiratory failure ([facility])   Acute respiratory failure ([facility])    Subjective:  feeling better still slight abd discomfort Medications:  . amitriptyline  25 mg Oral QHS  . atorvastatin  40 mg Oral Daily  . baclofen  30 mg Oral TID  . cloNIDine  0.1 mg Oral TID  . collagenase   Topical Daily  . gabapentin  300 mg Oral TID  . heparin  5,000 Units Subcutaneous Q8H  . insulin aspart  0-15 Units Subcutaneous TID WC  . insulin aspart  0-5 Units Subcutaneous QHS  . mouth rinse  15 mL Mouth Rinse BID  . metoprolol tartrate  12.5 mg Oral BID  . pantoprazole  40 mg Oral Daily  . polyethylene glycol  17 g Oral Daily    Objective: Vital signs in last 24 hours: Temp:  [98.2 F (36.8 C)-99.9 F (37.7 C)] 98.2 F (36.8 C) (12/05 0457) Pulse Rate:  [97-122] 97 (12/05 0513) Resp:  [24-37] 24 (12/05 0457) BP: (166-207)/(100-136) 173/119 (12/05 0513) SpO2:  [92 %-100 %] 98 % (12/05 0457) Weight:  [211 lb 10.3 oz (96 kg)] 211 lb 10.3 oz (96 kg) (12/05 6433) Physical Exam  Constitutional: He is oriented to person, place, and time. He appears well-developed and well-nourished. No distress.  HENT:  Mouth/Throat: Oropharynx is clear and moist. No oropharyngeal exudate.  Cardiovascular: Normal rate, regular rhythm and normal heart sounds. Exam  reveals no gallop and no friction rub.  No murmur heard.  Pulmonary/Chest: Effort normal and breath sounds normal. No respiratory distress. He has no wheezes.  Abdominal: Soft. Protuberant contour. Bowel sounds are normal. He exhibits no distension. There is no tenderness.  Lymphadenopathy:  He has no cervical adenopathy.  Neurological: He is alert and oriented to person, place, and time. Motor weakness to hands bilaterally with muscle wasting, 0/5 motor to lower extremities/flaccid paralysis Skin: Skin is warm and dry. No rash noted. No erythema.  Psychiatric: He has a normal mood and affect. His behavior is normal.     Lab Results Recent Labs    11/12/17 0347 11/13/17 0846  WBC 9.1 9.8  HGB 8.5* 9.5*  HCT 27.1* 29.3*  NA 142 136  K 3.5 4.0  CL 109 104  CO2 25 22  BUN 7 8  CREATININE 1.12 0.89    Microbiology: 11/30 blood cx kleb (amp R and Cipro R) 11/30 ur cx kleb and psa Studies/Results: Ct Abdomen Pelvis Wo Contrast  Addendum Date: 11/12/2017   ADDENDUM REPORT: 11/12/2017 05:14 ADDENDUM: These results were called by telephone at the time of interpretation on 11/11/2017 at 10:19 p.m. to Dr. Corinna Lines, who verbally acknowledged these results. Electronically Signed   By: Fidela Salisbury M.D.   On:  11/12/2017 05:14   Result Date: 11/12/2017 CLINICAL DATA:  Recurrent urinary tract infections. Increase creatinine. Right hydronephrosis found on recent abdominal ultrasound. EXAM: CT ABDOMEN AND PELVIS WITHOUT CONTRAST TECHNIQUE: Multidetector CT imaging of the abdomen and pelvis was performed following the standard protocol without IV contrast. COMPARISON:  Ultrasound 11/09/2017 FINDINGS: Lower chest: Hyperventilatory changes of bilateral lung bases. Small bilateral pleural effusions. Small pericardial effusion, maximum thickness of 12 mm posteriorly. Calcific atherosclerotic disease of the coronary arteries. Hepatobiliary: No focal liver abnormality is seen. No gallstones,  gallbladder wall thickening, or biliary dilatation. Pancreas: Unremarkable. No pancreatic ductal dilatation or surrounding inflammatory changes. Spleen: Normal in size without focal abnormality. Adrenals/Urinary Tract: Normal adrenal glands. 1.5 cm obstructive calculus in the proximal right ureter, causing moderate right hydronephrosis. Other 2 large calculi are seen within the right renal pelvis, the larger measuring 15 mm. There also other nonobstructive renal calculi within the right renal parenchyma, with a cluster of calculi within the lower pole of the right kidney measuring 10 mm. There is a mild right perinephric renal stranding. Additionally, in the distal right ureter there is an 8 mm calculus, approximately 1 cm from the vesicoureteral junction. No significant upstream ureteral dilation is seen, suggesting that this is a nonobstructive calculus. There are small nonobstructive calculi in the left kidney, the largest measuring 5 mm. No evidence of left hydroureter. There is a suprapubic catheter. The urinary bladder is collapsed around it and therefore suboptimally evaluated, with symmetric mucosal thickening. Stomach/Bowel: Stomach is within normal limits. Appendix appears normal. No evidence of bowel wall thickening, distention, or inflammatory changes. Scattered colonic diverticulosis. Vascular/Lymphatic: Aortic atherosclerosis. No enlarged abdominal or pelvic lymph nodes. Reproductive: Prostate is unremarkable. Other: No abdominal wall hernia or abnormality. No abdominopelvic ascites. Musculoskeletal: Post right femoral pinning. Posttraumatic calcifications about the left hip. Small scattered lytic lesions in the left ilium. IMPRESSION: 1. 1.5 cm obstructive calculus in the proximal right ureter causing moderate right hydronephrosis. Additional large right renal calculi within the right renal pelvis and parenchyma. 2. 8 mm right distal ureteral calculus, without associated upstream ureteral dilation,  suggesting that this is a nonobstructive calculus. 3. Left nephrolithiasis without evidence of hydronephrosis or hydroureter. 4. Diffuse thickening of the urinary bladder wall, decompressed around suprapubic catheter and therefore poorly evaluated. 5. Subcentimeter lytic lesions in the left ilium, nonspecific. Please correlate to clinical history regarding malignancy capable of producing lytic lesions. 6. Small pericardial effusion. 7. Small bilateral pleural effusions. Bilateral lower lobe atelectasis. Infectious airspace consolidation is considered less likely. 8. Calcific atherosclerotic disease of the coronary arteries and aorta. Electronically Signed: By: Fidela Salisbury M.D. On: 11/11/2017 21:48   Dg C-arm 1-60 Min-no Report  Result Date: 11/12/2017 Fluoroscopy was utilized by the requesting physician.  No radiographic interpretation.     Assessment/Plan: 51yo M with SCI, neurogenic bladder admitted for sepsis for urinary source and secondary bacteremia. Found to have obstructive stone  - recommend 2 wk of IV zerbaxa.using today as day 1 to treat pyelonephritis, and associated bacteremia - continue with gentamicin while he is hospitalized. May be too risky to administer at snf since monitoring for therapeutic drug monitoring is delayed - will check blood cx today to ensure no longer bacteremic, especially post instrumentation - can get picc line tomorrow  [facility] Fl[facility] Cell: [phone] Pager: [phone]  11/13/2017, 1:24 PM

## 2017-11-13 NOTE — Progress Notes (Signed)
Patient ID: Phillip [name], male   DOB: [birth year], 51 y.o.   MRN: [id number]  1 Day Post-Op Subjective: Pt with neurogenic bladder who is well known to me.  S/P right ureteral stent for obstructing stone and infection.  On gentamicin for Klebsiella and Pseudomonas infection.  Objective: Vital signs in last 24 hours: Temp:  [98.2 F (36.8 C)-99 F (37.2 C)] 98.2 F (36.8 C) (12/05 1328) Pulse Rate:  [91-122] 91 (12/05 1328) Resp:  [20-24] 20 (12/05 1328) BP: (142-207)/(97-136) 142/97 (12/05 1328) SpO2:  [97 %-100 %] 97 % (12/05 1328) Weight:  [96 kg (211 lb 10.3 oz)] 96 kg (211 lb 10.3 oz) (12/05 7035)  Intake/Output from previous day: 12/04 0701 - 12/05 0700 In: 3429.1 [I.V.:3095.8; IV Piggyback:333.3] Out: 4200 [Urine:4200] Intake/Output this shift: No intake/output data recorded.  Physical Exam:  General: Alert and oriented Abd: No CVAT  Lab Results: Recent Labs    11/11/17 0321 11/12/17 0347 11/13/17 0846  HGB 8.1* 8.5* 9.5*  HCT 25.2* 27.1* 29.3*   CBC Latest Ref Rng & Units 11/13/2017 11/12/2017 11/11/2017  WBC 4.0 - 10.5 K/uL 9.8 9.1 11.8(H)  Hemoglobin 13.0 - 17.0 g/dL 9.5(L) 8.5(L) 8.1(L)  Hematocrit 39.0 - 52.0 % 29.3(L) 27.1(L) 25.2(L)  Platelets 150 - 400 K/uL 487(H) 348 311     BMET Recent Labs    11/12/17 0347 11/13/17 0846  NA 142 136  K 3.5 4.0  CL 109 104  CO2 25 22  GLUCOSE 93 167*  BUN 7 8  CREATININE 1.12 0.89  CALCIUM 8.7* 8.9     Studies/Results: Ct Abdomen Pelvis Wo Contrast  Addendum Date: 11/12/2017   ADDENDUM REPORT: 11/12/2017 05:14 ADDENDUM: These results were called by telephone at the time of interpretation on 11/11/2017 at 10:19 p.m. to Dr. Corinna Lines, who verbally acknowledged these results. Electronically Signed   By: Fidela Salisbury M.D.   On: 11/12/2017 05:14   Result Date: 11/12/2017 CLINICAL DATA:  Recurrent urinary tract infections. Increase creatinine. Right hydronephrosis found on recent abdominal ultrasound.  EXAM: CT ABDOMEN AND PELVIS WITHOUT CONTRAST TECHNIQUE: Multidetector CT imaging of the abdomen and pelvis was performed following the standard protocol without IV contrast. COMPARISON:  Ultrasound 11/09/2017 FINDINGS: Lower chest: Hyperventilatory changes of bilateral lung bases. Small bilateral pleural effusions. Small pericardial effusion, maximum thickness of 12 mm posteriorly. Calcific atherosclerotic disease of the coronary arteries. Hepatobiliary: No focal liver abnormality is seen. No gallstones, gallbladder wall thickening, or biliary dilatation. Pancreas: Unremarkable. No pancreatic ductal dilatation or surrounding inflammatory changes. Spleen: Normal in size without focal abnormality. Adrenals/Urinary Tract: Normal adrenal glands. 1.5 cm obstructive calculus in the proximal right ureter, causing moderate right hydronephrosis. Other 2 large calculi are seen within the right renal pelvis, the larger measuring 15 mm. There also other nonobstructive renal calculi within the right renal parenchyma, with a cluster of calculi within the lower pole of the right kidney measuring 10 mm. There is a mild right perinephric renal stranding. Additionally, in the distal right ureter there is an 8 mm calculus, approximately 1 cm from the vesicoureteral junction. No significant upstream ureteral dilation is seen, suggesting that this is a nonobstructive calculus. There are small nonobstructive calculi in the left kidney, the largest measuring 5 mm. No evidence of left hydroureter. There is a suprapubic catheter. The urinary bladder is collapsed around it and therefore suboptimally evaluated, with symmetric mucosal thickening. Stomach/Bowel: Stomach is within normal limits. Appendix appears normal. No evidence of bowel wall thickening, distention, or inflammatory changes. Scattered  colonic diverticulosis. Vascular/Lymphatic: Aortic atherosclerosis. No enlarged abdominal or pelvic lymph nodes. Reproductive: Prostate is  unremarkable. Other: No abdominal wall hernia or abnormality. No abdominopelvic ascites. Musculoskeletal: Post right femoral pinning. Posttraumatic calcifications about the left hip. Small scattered lytic lesions in the left ilium. IMPRESSION: 1. 1.5 cm obstructive calculus in the proximal right ureter causing moderate right hydronephrosis. Additional large right renal calculi within the right renal pelvis and parenchyma. 2. 8 mm right distal ureteral calculus, without associated upstream ureteral dilation, suggesting that this is a nonobstructive calculus. 3. Left nephrolithiasis without evidence of hydronephrosis or hydroureter. 4. Diffuse thickening of the urinary bladder wall, decompressed around suprapubic catheter and therefore poorly evaluated. 5. Subcentimeter lytic lesions in the left ilium, nonspecific. Please correlate to clinical history regarding malignancy capable of producing lytic lesions. 6. Small pericardial effusion. 7. Small bilateral pleural effusions. Bilateral lower lobe atelectasis. Infectious airspace consolidation is considered less likely. 8. Calcific atherosclerotic disease of the coronary arteries and aorta. Electronically Signed: By: Fidela Salisbury M.D. On: 11/11/2017 21:48   Dg C-arm 1-60 Min-no Report  Result Date: 11/12/2017 Fluoroscopy was utilized by the requesting physician.  No radiographic interpretation.    Assessment/Plan: 1) Pyelonephritis: S/P stent with plans to complete 2 weeks of antibiotics.  Per ID, will be treated with IV Zerbaxa per PICC for 2 weeks.  2) Large burden right proximal ureteral and renal calculi: We discussed proceeding with definitive stone treatment after completing treatment for acute infection.  I will arrange outpatient follow up.  We discussed options for stone treatment today and agreed that right percutaneous nephrolithotomy would give patient the best chance to render him stone free in one procedure.  Pt prefers this option over  ureteroscopic treatment that may required staged and multiple procedures.  ESWL would not be ideal with larger burden of stone present.   LOS: 4 days   Ilir Mahrt,LES 11/13/2017, 7:46 PM

## 2017-11-13 NOTE — Progress Notes (Signed)
PROGRESS NOTE    BRENDT DIBLE  VQM:[id number] DOB: 1966-03-20 DOA: 11/08/2017 PCP: Patient, No Pcp Per    Brief Narrative: 51 yr old male with incomplete quadriplegia resides in a health care center with recurrenty UTI coming in with fever and altered mental status, admitted for sepsis from MDR UTI , s/p intubation and admitted to [facility] service for further evaluation. PT was extubated the next day on 12/2 and transferred to [facility] service on 12/4.    Assessment & Plan:   Active Problems:   Severe sepsis ([place])   Acute hypoxemic respiratory failure (HCC)   Acute respiratory failure (HCC)   Acute respiratory failure with compromised airway.  S/p extubation on 12/2.  He is on RA without any issues.  Possibly from AMS.    Acute  Metabolic encephalopathy sec to sepsis Resolved.    Incomplete quadriplegia:  Resume baclofen and neurontin.    Hypertension' Sub optimal, restart clonidine.  Check BP tonight.    Sepsis secondary to MDR pseudomonas UTI from neurogenic bladder with renal US showing hydronephrosis and  Obstructing left renal stone. And also klebsiella bacteremia coming from the urinary source. Currently on IV antibiotics.  ID on board.  Monitor renal parameters while on antibiotics.  Urology consulted and he underwent  Cystoscopy.  Right retrograde pyelogram with intraoperative interpretation of fluoroscopic imaging.  Right 6 French JJ stent placement.  20 French suprapubic catheter exchange.   Recommend outpatient follow up with urology and ID clinic as recommended on discharge.      Hypomagnesemia and hypophosphatemia:  Replaced.    Chronic multiple pressure ulcers. Wound care consulted and recommendations given.  See wound care note.    Anemia of chronic disease" Monitor periodically.          DVT prophylaxis: lovenox.  Code Status:  Full code.  Family Communication: none at bedside.  Disposition Plan: discharge to SNF in am if remains  afebrile.    Consultants:   ID   Urology    Procedures: Cystoscopy 2.  Right retrograde pyelogram with intraoperative interpretation of fluoroscopic imaging 3.  Right 6 French JJ stent placement 4.  20 French suprapubic catheter exchange      Antimicrobials: Gentamicin and Zerbaxa    Subjective: Wants to go home,   Objective: Vitals:   11/13/17 0457 11/13/17 0513 11/13/17 0632 11/13/17 1328  BP: (!) 182/121 (!) 173/119  (!) 142/97  Pulse: 99 97  91  Resp: (!) 24   20  Temp: 98.2 F (36.8 C)   98.2 F (36.8 C)  TempSrc: Oral   Oral  SpO2: 98%   97%  Weight:   96 kg (211 lb 10.3 oz)   Height:        Intake/Output Summary (Last 24 hours) at 11/13/2017 1715 Last data filed at 11/13/2017 1328 Gross per 24 hour  Intake 2124.14 ml  Output 3650 ml  Net -1525.86 ml   Filed Weights   11/10/17 0456 11/11/17 0500 11/13/17 9326  Weight: 95.8 kg (211 lb 3.2 oz) 96.4 kg (212 lb 8.4 oz) 96 kg (211 lb 10.3 oz)    Examination:  General exam: Appears calm and comfortable  Respiratory system: Clear to auscultation. Respiratory effort normal. Cardiovascular system: S1 & S2 heard, RRR. No JVD, murmurs, rubs, gallops or clicks. No pedal edema. Gastrointestinal system: Abdomen is nondistended, soft and nontender. No organomegaly or masses felt. Normal bowel sounds heard. Central nervous system: Alert and oriented. No focal neurological deficits. Extremities: Symmetric 5 x  5 power. Skin: No rashes, lesions or ulcers Psychiatry: Judgement and insight appear normal. Mood & affect appropriate.     Data Reviewed: I have personally reviewed following labs and imaging studies  CBC: Recent Labs  Lab 11/08/17 2255 11/09/17 0354 11/10/17 0328 11/11/17 0321 11/12/17 0347 11/13/17 0846  WBC 27.7* 18.3* 14.8* 11.8* 9.1 9.8  NEUTROABS 21.1*  --   --   --   --  6.8  HGB 8.3* 10.2* 7.9* 8.1* 8.5* 9.5*  HCT 25.8* 32.2* 24.7* 25.2* 27.1* 29.3*  MCV 92.5 93.6 92.5 91.3 91.2 88.8    PLT 405* 283 247 311 348 062*   Basic Metabolic Panel: Recent Labs  Lab 11/09/17 0354 11/09/17 0936 11/09/17 1638 11/10/17 0328 11/11/17 0321 11/12/17 0347 11/13/17 0846  NA 140  --   --  137 139 142 136  K 4.3  --   --  3.6 3.7 3.5 4.0  CL 110  --   --  106 107 109 104  CO2 20*  --   --  23 23 25 22   GLUCOSE 139*  --   --  168* 84 93 167*  BUN 20  --   --  15 8 7 8   CREATININE 1.37*  --   --  1.17 1.08 1.12 0.89  CALCIUM 8.2*  --   --  8.0* 8.2* 8.7* 8.9  MG 1.4* 2.5* 2.3 2.1 1.8  --  1.9  PHOS 3.3 2.3* 2.1* 1.9* 2.6  --   --    GFR: Estimated Creatinine Clearance: 120 mL/min (by C-G formula based on SCr of 0.89 mg/dL). Liver Function Tests: Recent Labs  Lab 11/08/17 2255  AST 28  ALT 41  ALKPHOS 135*  BILITOT 1.4*  PROT 8.1  ALBUMIN 3.6   No results for input(s): LIPASE, AMYLASE in the last 168 hours. No results for input(s): AMMONIA in the last 168 hours. Coagulation Profile: Recent Labs  Lab 11/08/17 2255  INR 1.73   Cardiac Enzymes: No results for input(s): CKTOTAL, CKMB, CKMBINDEX, TROPONINI in the last 168 hours. BNP (last 3 results) No results for input(s): PROBNP in the last 8760 hours. HbA1C: No results for input(s): HGBA1C in the last 72 hours. CBG: Recent Labs  Lab 11/12/17 1840 11/12/17 2050 11/13/17 0732 11/13/17 1123 11/13/17 1644  GLUCAP 153* 215* 167* 188* 128*   Lipid Profile: No results for input(s): CHOL, HDL, LDLCALC, TRIG, CHOLHDL, LDLDIRECT in the last 72 hours. Thyroid Function Tests: No results for input(s): TSH, T4TOTAL, FREET4, T3FREE, THYROIDAB in the last 72 hours. Anemia Panel: No results for input(s): VITAMINB12, FOLATE, FERRITIN, TIBC, IRON, RETICCTPCT in the last 72 hours. Sepsis Labs: Recent Labs  Lab 11/08/17 2310 11/09/17 0049 11/09/17 0354 11/09/17 0745 11/10/17 0328 11/11/17 0321  PROCALCITON  --  10.58  --   --  6.49 4.17  LATICACIDVEN 1.71  --  2.2* 1.6  --   --     Recent Results (from the past  240 hour(s))  Culture, blood (Routine x 2)     Status: Abnormal   Collection Time: 11/08/17 10:55 PM  Result Value Ref Range Status   Specimen Description BLOOD LEFT WRIST  Final   Special Requests IN PEDIATRIC BOTTLE Blood Culture adequate volume  Final   Culture  Setup Time   Final    GRAM NEGATIVE RODS IN PEDIATRIC BOTTLE CRITICAL VALUE NOTED.  VALUE IS CONSISTENT WITH PREVIOUSLY REPORTED AND CALLED VALUE.    Culture (A)  Final  KLEBSIELLA PNEUMONIAE SUSCEPTIBILITIES PERFORMED ON PREVIOUS CULTURE WITHIN THE LAST 5 DAYS. Performed at [facility], [place] [address], [place], [place] [address]    Report Status 11/12/2017 FINAL  Final  Culture, blood (Routine x 2)     Status: Abnormal   Collection Time: 11/08/17 11:55 PM  Result Value Ref Range Status   Specimen Description BLOOD LEFT ANTECUBITAL  Final   Special Requests   Final    BOTTLES DRAWN AEROBIC AND ANAEROBIC Blood Culture adequate volume   Culture  Setup Time   Final    GRAM NEGATIVE RODS ANAEROBIC BOTTLE ONLY CRITICAL RESULT CALLED TO, READ BACK BY AND VERIFIED WITH: B.GREEN,PHARMD AT 0108 ON 11/10/17 BY G.MCADOO Performed at [facility], [place] [address] [place], [place] [address]    Culture KLEBSIELLA PNEUMONIAE (A)  Final   Report Status 11/12/2017 FINAL  Final   Organism ID, Bacteria KLEBSIELLA PNEUMONIAE  Final      Susceptibility   Klebsiella pneumoniae - MIC*    AMPICILLIN >=32 RESISTANT Resistant     CEFAZOLIN <=4 SENSITIVE Sensitive     CEFEPIME <=1 SENSITIVE Sensitive     CEFTAZIDIME <=1 SENSITIVE Sensitive     CEFTRIAXONE <=1 SENSITIVE Sensitive     CIPROFLOXACIN >=4 RESISTANT Resistant     GENTAMICIN <=1 SENSITIVE Sensitive     IMIPENEM <=0.25 SENSITIVE Sensitive     TRIMETH/SULFA <=20 SENSITIVE Sensitive     AMPICILLIN/SULBACTAM 8 SENSITIVE Sensitive     PIP/TAZO <=4 SENSITIVE Sensitive     Extended ESBL NEGATIVE Sensitive     * KLEBSIELLA PNEUMONIAE  Blood Culture ID Panel (Reflexed)      Status: Abnormal   Collection Time: 11/08/17 11:55 PM  Result Value Ref Range Status   Enterococcus species NOT DETECTED NOT DETECTED Final   Listeria monocytogenes NOT DETECTED NOT DETECTED Final   Staphylococcus species NOT DETECTED NOT DETECTED Final   Staphylococcus aureus NOT DETECTED NOT DETECTED Final   Streptococcus species NOT DETECTED NOT DETECTED Final   Streptococcus agalactiae NOT DETECTED NOT DETECTED Final   Streptococcus pneumoniae NOT DETECTED NOT DETECTED Final   Streptococcus pyogenes NOT DETECTED NOT DETECTED Final   Acinetobacter baumannii NOT DETECTED NOT DETECTED Final   Enterobacteriaceae species DETECTED (A) NOT DETECTED Final    Comment: Enterobacteriaceae represent a large family of gram-negative bacteria, not a single organism. CRITICAL RESULT CALLED TO, READ BACK BY AND VERIFIED WITH: B.GREEN,PHARMD AT 0108 ON 11/10/17 BY G.MCADOO    Enterobacter cloacae complex NOT DETECTED NOT DETECTED Final   Escherichia coli NOT DETECTED NOT DETECTED Final   Klebsiella oxytoca NOT DETECTED NOT DETECTED Final   Klebsiella pneumoniae DETECTED (A) NOT DETECTED Final    Comment: CRITICAL RESULT CALLED TO, READ BACK BY AND VERIFIED WITH: B.GREEN,PHARMD AT 0108 ON 11/10/17 BY G.MCADOO    Proteus species NOT DETECTED NOT DETECTED Final   Serratia marcescens NOT DETECTED NOT DETECTED Final   Carbapenem resistance NOT DETECTED NOT DETECTED Final   Haemophilus influenzae NOT DETECTED NOT DETECTED Final   Neisseria meningitidis NOT DETECTED NOT DETECTED Final   Pseudomonas aeruginosa NOT DETECTED NOT DETECTED Final   Candida albicans NOT DETECTED NOT DETECTED Final   Candida glabrata NOT DETECTED NOT DETECTED Final   Candida krusei NOT DETECTED NOT DETECTED Final   Candida parapsilosis NOT DETECTED NOT DETECTED Final   Candida tropicalis NOT DETECTED NOT DETECTED Final    Comment: Performed at [facility], [place]. [address], [place], [place] [address]  Urine culture      Status: Abnormal   Collection Time: 11/09/17  1:44 AM  Result Value Ref Range Status   Specimen Description URINE, CATHETERIZED  Final   Special Requests NONE  Final   Culture (A)  Final    >=100,000 COLONIES/mL PSEUDOMONAS AERUGINOSA >=100,000 COLONIES/mL KLEBSIELLA PNEUMONIAE PSEUDOMONAS AERUGINOSA MULTI-DRUG RESISTANT ORGANISM    Report Status 11/12/2017 FINAL  Final   Organism ID, Bacteria KLEBSIELLA PNEUMONIAE (A)  Final   Organism ID, Bacteria PSEUDOMONAS AERUGINOSA (A)  Final      Susceptibility   Klebsiella pneumoniae - MIC*    AMPICILLIN >=32 RESISTANT Resistant     CEFAZOLIN <=4 SENSITIVE Sensitive     CEFTRIAXONE <=1 SENSITIVE Sensitive     CIPROFLOXACIN >=4 RESISTANT Resistant     GENTAMICIN <=1 SENSITIVE Sensitive     IMIPENEM <=0.25 SENSITIVE Sensitive     NITROFURANTOIN 256 RESISTANT Resistant     TRIMETH/SULFA 40 SENSITIVE Sensitive     AMPICILLIN/SULBACTAM 8 SENSITIVE Sensitive     PIP/TAZO <=4 SENSITIVE Sensitive     Extended ESBL NEGATIVE Sensitive     * >=100,000 COLONIES/mL KLEBSIELLA PNEUMONIAE   Pseudomonas aeruginosa - MIC*    CEFTAZIDIME >=64 RESISTANT Resistant     CIPROFLOXACIN 2 INTERMEDIATE Intermediate     GENTAMICIN 2 SENSITIVE Sensitive     IMIPENEM >=16 RESISTANT Resistant     CEFEPIME >=64 RESISTANT Resistant     * >=100,000 COLONIES/mL PSEUDOMONAS AERUGINOSA  MRSA PCR Screening     Status: None   Collection Time: 11/09/17  3:42 AM  Result Value Ref Range Status   MRSA by PCR NEGATIVE NEGATIVE Final    Comment:        The GeneXpert MRSA Assay (FDA approved for NASAL specimens only), is one component of a comprehensive MRSA colonization surveillance program. It is not intended to diagnose MRSA infection nor to guide or monitor treatment for MRSA infections.   Urine Culture     Status: None   Collection Time: 11/12/17  2:03 PM  Result Value Ref Range Status   Specimen Description URINE, RANDOM CYSTOSCOPY RIGHT KIDNEY  Final    Special Requests NONE  Final   Culture   Final    NO GROWTH Performed at [facility], [address] [place], [place] [address]    Report Status 11/13/2017 FINAL  Final         Radiology Studies: Ct Abdomen Pelvis Wo Contrast  Addendum Date: 11/12/2017   ADDENDUM REPORT: 11/12/2017 05:14 ADDENDUM: These results were called by telephone at the time of interpretation on 11/11/2017 at 10:19 p.m. to Dr. Corinna Lines, who verbally acknowledged these results. Electronically Signed   By: Fidela Salisbury M.D.   On: 11/12/2017 05:14   Result Date: 11/12/2017 CLINICAL DATA:  Recurrent urinary tract infections. Increase creatinine. Right hydronephrosis found on recent abdominal ultrasound. EXAM: CT ABDOMEN AND PELVIS WITHOUT CONTRAST TECHNIQUE: Multidetector CT imaging of the abdomen and pelvis was performed following the standard protocol without IV contrast. COMPARISON:  Ultrasound 11/09/2017 FINDINGS: Lower chest: Hyperventilatory changes of bilateral lung bases. Small bilateral pleural effusions. Small pericardial effusion, maximum thickness of 12 mm posteriorly. Calcific atherosclerotic disease of the coronary arteries. Hepatobiliary: No focal liver abnormality is seen. No gallstones, gallbladder wall thickening, or biliary dilatation. Pancreas: Unremarkable. No pancreatic ductal dilatation or surrounding inflammatory changes. Spleen: Normal in size without focal abnormality. Adrenals/Urinary Tract: Normal adrenal glands. 1.5 cm obstructive calculus in the proximal right ureter, causing moderate right hydronephrosis.  Other 2 large calculi are seen within the right renal pelvis, the larger measuring 15 mm. There also other nonobstructive renal calculi within the right renal parenchyma, with a cluster of calculi within the lower pole of the right kidney measuring 10 mm. There is a mild right perinephric renal stranding. Additionally, in the distal right ureter there is an 8 mm calculus, approximately 1  cm from the vesicoureteral junction. No significant upstream ureteral dilation is seen, suggesting that this is a nonobstructive calculus. There are small nonobstructive calculi in the left kidney, the largest measuring 5 mm. No evidence of left hydroureter. There is a suprapubic catheter. The urinary bladder is collapsed around it and therefore suboptimally evaluated, with symmetric mucosal thickening. Stomach/Bowel: Stomach is within normal limits. Appendix appears normal. No evidence of bowel wall thickening, distention, or inflammatory changes. Scattered colonic diverticulosis. Vascular/Lymphatic: Aortic atherosclerosis. No enlarged abdominal or pelvic lymph nodes. Reproductive: Prostate is unremarkable. Other: No abdominal wall hernia or abnormality. No abdominopelvic ascites. Musculoskeletal: Post right femoral pinning. Posttraumatic calcifications about the left hip. Small scattered lytic lesions in the left ilium. IMPRESSION: 1. 1.5 cm obstructive calculus in the proximal right ureter causing moderate right hydronephrosis. Additional large right renal calculi within the right renal pelvis and parenchyma. 2. 8 mm right distal ureteral calculus, without associated upstream ureteral dilation, suggesting that this is a nonobstructive calculus. 3. Left nephrolithiasis without evidence of hydronephrosis or hydroureter. 4. Diffuse thickening of the urinary bladder wall, decompressed around suprapubic catheter and therefore poorly evaluated. 5. Subcentimeter lytic lesions in the left ilium, nonspecific. Please correlate to clinical history regarding malignancy capable of producing lytic lesions. 6. Small pericardial effusion. 7. Small bilateral pleural effusions. Bilateral lower lobe atelectasis. Infectious airspace consolidation is considered less likely. 8. Calcific atherosclerotic disease of the coronary arteries and aorta. Electronically Signed: By: Fidela Salisbury M.D. On: 11/11/2017 21:48   Dg C-arm 1-60  Min-no Report  Result Date: 11/12/2017 Fluoroscopy was utilized by the requesting physician.  No radiographic interpretation.        Scheduled Meds: . amitriptyline  25 mg Oral QHS  . atorvastatin  40 mg Oral Daily  . baclofen  30 mg Oral TID  . cloNIDine  0.1 mg Oral TID  . collagenase   Topical Daily  . gabapentin  300 mg Oral TID  . heparin  5,000 Units Subcutaneous Q8H  . insulin aspart  0-15 Units Subcutaneous TID WC  . insulin aspart  0-5 Units Subcutaneous QHS  . mouth rinse  15 mL Mouth Rinse BID  . metoprolol tartrate  12.5 mg Oral BID  . pantoprazole  40 mg Oral Daily  . polyethylene glycol  17 g Oral Daily   Continuous Infusions: . ceftolozane-tazobactam (ZERBAXA) IVPB Stopped (11/13/17 1532)  . [START ON 11/14/2017] gentamicin       LOS: 4 days    Time spent: 30 MINUTES.     Hosie Poisson, MD [facility] Pager [phone]  If 7PM-7AM, please contact night-coverage [email or web address] Password [email or web address] 11/13/2017, 5:15 PM

## 2017-11-13 NOTE — Progress Notes (Signed)
Pharmacy Antibiotic Note  Phillip [name] is a 51 y.o. male with altered mental status admitted on 11/08/2017 with sepsis.  Pharmacy has been consulted for zosyn and vancomycin dosing.  Vancomycin stopped with isolation of K. Pneumoniae in blood.  K. Pneumoniae also in UCx along with a MDR pseudomonas aeruginosa.  ID consulted 12/4 and will change zosyn to zerbaxa and gentamicin. Gent 7 mg/kg given x 1 on 12/2 but not continued until 12/4.   Today, 11/13/2017  AKI from admission has resolved  WBC WNL  Afebrile x 24 hr  Gent given 2000 on 12/4  Random level drawn at 0845 (~13 hrs) consistent with q36 hr dosing  Plan:  Continue Zerbaxa 1.5 gm IV q8h x 14 days  Gentamicin 420mg  IVPB q36h  Monitor renal function closely - will recheck SCr 12/7 if still admitted  Per d/w ID, current plan will be zerbaxa + gent in hospital; Zerbaxa monotherapy after discharge  Height: 5\' 11"  (180.3 cm) Weight: 211 lb 10.3 oz (96 kg) IBW/kg (Calculated) : 75.3  Temp (24hrs), Avg:99 F (37.2 C), Min:98.2 F (36.8 C), Max:99.9 F (37.7 C)  Recent Labs  Lab 11/08/17 2310 11/09/17 0354 11/09/17 0745 11/10/17 0328 11/11/17 0321 11/12/17 0347 11/13/17 0846  WBC  --  18.3*  --  14.8* 11.8* 9.1 9.8  CREATININE  --  1.37*  --  1.17 1.08 1.12 0.89  LATICACIDVEN 1.71 2.2* 1.6  --   --   --   --   GENTRANDOM  --   --   --   --   --   --  3.9    Estimated Creatinine Clearance: 120 mL/min (by C-G formula based on SCr of 0.89 mg/dL).    No Known Allergies  Antimicrobials this admission: 12/1 cefepime >> x1 ED 12/1 zosyn >> 12/4 12/1 vancomycin >> 12/2 12/2 gent x 1 dose  12/4 zerbaxa >> 12/4 gent >>  Dose adjustments this admission:  Microbiology results: 11/30 BCx: GNR in both sets (BCID = Klebsiella pneumoniae. 12/1 UCx: > 100K PsA (R to all except tob, gent, amikacin [R to pip/tazo])                  > Klebsiella pneumoniae (S zosyn) 12/1 MRSA PCR: neg 12/4 UCx (cysto sample):  NGF  Thank you for allowing pharmacy to be a part of this patient's care.  Reuel Boom, PharmD, BCPS Pager: [phone] 11/13/2017, 2:58 PM

## 2017-11-14 DIAGNOSIS — L8940 Pressure ulcer of contiguous site of back, buttock and hip, unspecified stage: Secondary | ICD-10-CM

## 2017-11-14 LAB — GLUCOSE, CAPILLARY
Glucose-Capillary: 128 mg/dL — ABNORMAL HIGH (ref 65–99)
Glucose-Capillary: 127 mg/dL — ABNORMAL HIGH (ref 65–99)

## 2017-11-14 MED ORDER — SODIUM CHLORIDE 0.9% FLUSH: 10.0000 mL | INTRAVENOUS | Status: DC | PRN

## 2017-11-14 MED ORDER — SENNOSIDES-DOCUSATE SODIUM 8.6-50 MG PO TABS: 2.0000 | ORAL_TABLET | Freq: Two times a day (BID) | ORAL | Status: AC | PRN

## 2017-11-14 MED ORDER — CEFTOLOZANE-TAZOBACTAM IV (FOR PTA / DISCHARGE USE): 1.5000 g | Freq: Three times a day (TID) | INTRAVENOUS | 0 refills | Status: AC

## 2017-11-14 MED ORDER — HEPARIN SOD (PORK) LOCK FLUSH 100 UNIT/ML IV SOLN
250.0000 [IU] | INTRAVENOUS | Status: AC | PRN
  Administered 2017-11-14: 250 [IU]

## 2017-11-14 NOTE — Plan of Care (Signed)
  Education: Knowledge of General Education information will improve 11/14/2017 0605 - Progressing by Ashley Murrain, RN   Health Behavior/Discharge Planning: Ability to manage health-related needs will improve 11/14/2017 0605 - Progressing by Ashley Murrain, RN

## 2017-11-14 NOTE — Progress Notes (Signed)
Peripherally Inserted Central Catheter/Midline Placement  The IV Nurse has discussed with the patient and/or persons authorized to consent for the patient, the purpose of this procedure and the potential benefits and risks involved with this procedure.  The benefits include less needle sticks, lab draws from the catheter, and the patient may be discharged home with the catheter. Risks include, but not limited to, infection, bleeding, blood clot (thrombus formation), and puncture of an artery; nerve damage and irregular heartbeat and possibility to perform a PICC exchange if needed/ordered by physician.  Alternatives to this procedure were also discussed.  Bard Power PICC patient education guide, fact sheet on infection prevention and patient information card has been provided to patient /or left at bedside.    PICC/Midline Placement Documentation  PICC Single Lumen 11/14/17 PICC Right Brachial 41 cm (Active)  Indication for Insertion or Continuance of Line Home intravenous therapies (PICC only) 11/14/2017 10:00 AM  Site Assessment Clean;Dry;Intact 11/14/2017 10:00 AM  Line Status Flushed;Blood return noted 11/14/2017 10:00 AM  Dressing Type Transparent 11/14/2017 10:00 AM  Dressing Status Clean;Dry;Intact;Antimicrobial disc in place 11/14/2017 10:00 AM  Dressing Intervention New dressing 11/14/2017 10:00 AM  Dressing Change Due 11/21/17 11/14/2017 10:00 AM       Jule Economy Horton 11/14/2017, 10:53 AM

## 2017-11-14 NOTE — Clinical Social Work Note (Signed)
Clinical Social Work Assessment  Patient Details  Name: Phillip [name] MRN: [id number] Date of Birth: [birth year]  Date of referral:  11/14/17               Reason for consult:  Facility Placement, Discharge Planning                Permission sought to share information with:  Chartered certified accountant granted to share information::  Yes, Verbal Permission Granted  Name::        Agency::  [facility]  Relationship::     Contact Information:     Housing/Transportation Living arrangements for the past 2 months:  [place] of Information:  Patient Patient Interpreter Needed:  None Criminal Activity/Legal Involvement Pertinent to Current Situation/Hospitalization:  No - Comment as needed Significant Relationships:    Lives with:  Facility Resident Do you feel safe going back to the place where you live?  Yes Need for family participation in patient care:  No (Coment)  Care giving concerns:  Patient from [facility] SNF. Patient is a [facility] [name] term care resident.   Social Worker assessment / plan:  CSW spoke with patient at bedside regarding discharge planning. Patient reported that he has been at [facility] for approx 2 1/2 years and verbalized plan to return at dc. Patient reported that he will need PTAR for transport.   CSW contacted [facility] SNF and staff confirmed patient's ability to return.   CSW will completed FL2 and will continue to follow and assist patient with dc planning.   Employment status:  Disabled (Comment on whether or not currently receiving Disability) Insurance information:  Medicaid In [place] PT Recommendations:  Not assessed at this time Information / Referral to community resources:  Other (Comment Required)(From SNF ([name] term care))  Patient/Family's Response to care:  Patient appreciative assistance with discharge planning.  Patient/Family's Understanding of and Emotional  Response to Diagnosis, Current Treatment, and Prognosis:  Patient presented pleasant and verbalized understanding of diagnosis and current treatment. CSW and patient discussed patient's support system. Patient reported that he has 5 sisters and 3 kids locally that he speaks with often. Patient verbalized plan to dc back to current SNF.   Emotional Assessment Appearance:  Appears stated age Attitude/Demeanor/Rapport:  Other(Cooperative) Affect (typically observed):  Pleasant Orientation:  Oriented to Self, Oriented to Place, Oriented to  Time, Oriented to Situation Alcohol / Substance use:  Not Applicable Psych involvement (Current and /or in the community):  No (Comment)  Discharge Needs  Concerns to be addressed:  Care Coordination Readmission within the last 30 days:  Yes Current discharge risk:  None Barriers to Discharge:  No Barriers Identified   Phillip Medin, LCSW 11/14/2017, 2:29 PM

## 2017-11-14 NOTE — Progress Notes (Signed)
Patient returning to [facility] SNF. Facility aware of patient's discharge and confirmed patient's ability to return. PTAR contacted, patient aware. Patient's RN can call report to [phone], packet complete. CSW signing off, no other needs identified.   Abundio Miu, [place] Social Worker [facility] Cell#: [phone]

## 2017-11-14 NOTE — Progress Notes (Signed)
PHARMACY CONSULT NOTE FOR: Zerbaxa x total 14 days, Day 1 is 12/5  OUTPATIENT  PARENTERAL ANTIBIOTIC THERAPY (OPAT)  Indication: MDR resistant Pseudomonas in urine Regimen: Zerbaxa 1.5gm q8hr End date: Last dose 12/18 pm  IV antibiotic discharge orders are pended. To discharging provider:  please sign these orders via discharge navigator,  Select New Orders & click on the button choice - Manage This Unsigned Work.     Thank you for allowing pharmacy to be a part of this patient's care.  Minda Ditto 11/14/2017, 10:43 AM
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
## 2017-11-14 NOTE — Progress Notes (Signed)
Report called to Amy at [facility]. Patient stable for transport. [facility] at bedside to transport patient. Patient d/ced with RUA PICC line in place for IV antibiotics.   Othella Boyer [facility] 11/14/2017  4:29 PM

## 2017-11-14 NOTE — Discharge Summary (Addendum)
Physician Discharge Summary  Phillip [name] JOI:[id number] DOB: 06-Mar-1966 DOA: 11/08/2017  PCP: Patient, No Pcp Per  Admit date: 11/08/2017 Discharge date: 11/14/2017  Admitted From: snf Disposition: SNF  Recommendations for Outpatient Follow-up:  1. Follow up with PCP in 1-2 weeks 2. Please obtain BMP/CBC in one week 3. Please follow up with urology as recommended 4. Please follow up with ID clinic as recommended.     Discharge Condition: guarded.  CODE STATUS: full code.  Diet recommendation: Heart Healthy  Brief/Interim Summary: 51 yr old male with incomplete quadriplegia resides in a health care center with recurrenty UTI coming in with fever and altered mental status, admitted for sepsis from MDR UTI , s/p intubation and admitted to [facility] service for further evaluation. PT was extubated the next day on 12/2 and transferred to [facility] service on 12/4.     Discharge Diagnoses:  Active Problems:   Pressure ulcer   Sepsis ([place])   Acute hypoxemic respiratory failure (HCC)   Acute respiratory failure (HCC)   Pseudomonas infection   Pyelonephritis   Nephrolithiasis   Bacteremia due to Klebsiella pneumoniae   Hydronephrosis  Acute respiratory failure with compromised airway.  S/p extubation on 12/2.  He is on RA without any issues.  Possibly from AMS.    Acute  Metabolic encephalopathy sec to sepsis Resolved.    Incomplete quadriplegia:  Resume baclofen and neurontin.    Hypertension' Better controlled, restarted home meds.    Sepsis secondary to MDR pseudomonas UTI from neurogenic bladder with renal US showing hydronephrosis and  Obstructing left renal stone. And also klebsiella bacteremia coming from the urinary source. Resume IV antibiotics for another 2 weeks to complete the course.  ID on board.  Monitor renal parameters while on antibiotics.  Urology consulted and he underwent  Cystoscopy. Right retrograde pyelogram with intraoperative  interpretation of fluoroscopic imaging. Right 6 French JJ stent placement. 20 French suprapubic catheter exchange.   Recommend outpatient follow up with urology and ID clinic as recommended on discharge.      Hypomagnesemia and hypophosphatemia:  Replaced.    Chronic multiple pressure ulcers. Wound care consulted and recommendations given.  See wound care note.  Stage 3 pressure ulcer on the left thigh.    Anemia of chronic disease" Monitor periodically.           Discharge Instructions  Discharge Instructions    Diet - low sodium heart healthy   Complete by:  As directed    Discharge instructions   Complete by:  As directed    Please follow up with ID clinic and urology as recommended.   Home infusion instructions [facility] May follow [place] Dosing Protocol; May administer Cathflo as needed to maintain patency of vascular access device.; Flushing of vascular access device: per [facility] Protocol: 0.9% NaCl pre/post medica...   Complete by:  As directed    Instructions:  May follow [place] Dosing Protocol   Instructions:  May administer Cathflo as needed to maintain patency of vascular access device.   Instructions:  Flushing of vascular access device: per [facility] Protocol: 0.9% NaCl pre/post medication administration and prn patency; Heparin 100 u/ml, 65m for implanted ports and Heparin 10u/ml, 559mfor all other central venous catheters.   Instructions:  May follow [facility] Anaphylaxis Protocol for First Dose Administration in the home: 0.9% NaCl at 25-50 ml/hr to maintain IV access for protocol meds. Epinephrine 0.3 ml IV/IM PRN and Benadryl 25-50 IV/IM PRN s/s of anaphylaxis.  Instructions:  [facility] Infusion Coordinator (RN) to assist per patient IV care needs in the home PRN.     Allergies as of 11/14/2017   No Known Allergies     Medication List    TAKE these medications   acetaminophen 325 MG tablet Commonly known as:   TYLENOL Take 650 mg by mouth 3 (three) times daily.   amitriptyline 25 MG tablet Commonly known as:  ELAVIL Take 25 mg by mouth at bedtime.   ascorbic acid 500 MG tablet Commonly known as:  VITAMIN C Take 1 tablet (500 mg total) by mouth 2 (two) times daily.   atorvastatin 40 MG tablet Commonly known as:  LIPITOR Take 40 mg by mouth daily.   baclofen 20 MG tablet Commonly known as:  LIORESAL Take 20 mg by mouth 3 (three) times daily. Take with baclofen 10 mg to equal 30 mg What changed:  Another medication with the same name was changed. Make sure you understand how and when to take each.   baclofen 10 MG tablet Commonly known as:  LIORESAL Take 1 tablet (10 mg total) by mouth 3 (three) times daily. What changed:  additional instructions   bisacodyl 10 MG suppository Commonly known as:  DULCOLAX Place 10 mg rectally every 8 (eight) hours as needed for moderate constipation.   ceftolozane-tazobactam IVPB Commonly known as:  ZERBAXA Inject 1.5 g into the vein every 8 (eight) hours for 13 days. Indication:  MDR Pseudomonas in urine Last Day of Therapy:  12/18 to complete 14 days, with 12/5 as Day 1 Labs - Once weekly:  CBC/D and BMP, Labs - Every other week:  ESR and CRP   cloNIDine 0.1 MG tablet Commonly known as:  CATAPRES Take 0.1 mg by mouth 3 (three) times daily.   collagenase ointment Commonly known as:  SANTYL Apply 1 application every evening topically. Apply to left lateral foot   cyanocobalamin 1000 MCG tablet Take 1 tablet (1,000 mcg total) by mouth daily.   ferrous sulfate 325 (65 FE) MG tablet Take 1 tablet (325 mg total) by mouth 2 (two) times daily with a meal.   furosemide 20 MG tablet Commonly known as:  LASIX Take 1 tablet (20 mg total) by mouth daily.   gabapentin 300 MG capsule Commonly known as:  NEURONTIN Take 300 mg by mouth 3 (three) times daily.   Melatonin 3 MG Tabs Take 3-6 mg by mouth at bedtime as needed (for insomnia).    metoprolol tartrate 25 MG tablet Commonly known as:  LOPRESSOR Take 0.5 tablets (12.5 mg total) by mouth 2 (two) times daily.   multivitamin with minerals Tabs tablet Take 1 tablet by mouth daily.   ondansetron 4 MG tablet Commonly known as:  ZOFRAN Take 1 tablet (4 mg total) by mouth every 6 (six) hours as needed for nausea.   opium-belladonna 16.2-60 MG suppository Commonly known as:  B&O SUPPRETTES Place 1 suppository rectally every 8 (eight) hours as needed for bladder spasms.   pantoprazole 40 MG tablet Commonly known as:  PROTONIX Take 1 tablet (40 mg total) by mouth daily.   potassium chloride 10 MEQ tablet Commonly known as:  K-DUR,KLOR-CON Take 10 mEq daily by mouth.   saccharomyces boulardii 250 MG capsule Commonly known as:  FLORASTOR Take 1 capsule (250 mg total) by mouth 2 (two) times daily.   senna-docusate 8.6-50 MG tablet Commonly known as:  Senokot-S Take 2 tablets by mouth 2 (two) times daily as needed for mild constipation.  solifenacin 5 MG tablet Commonly known as:  VESICARE Take 5 mg by mouth daily.   zinc sulfate 220 (50 Zn) MG capsule Take 1 capsule (220 mg total) by mouth daily.            Home Infusion Instuctions  (From admission, onward)        Start     Ordered   11/14/17 0000  Home infusion instructions [facility] May follow [place] Dosing Protocol; May administer Cathflo as needed to maintain patency of vascular access device.; Flushing of vascular access device: per [facility] Protocol: 0.9% NaCl pre/post medica...    Question Answer Comment  Instructions May follow [place] Dosing Protocol   Instructions May administer Cathflo as needed to maintain patency of vascular access device.   Instructions Flushing of vascular access device: per [facility] Protocol: 0.9% NaCl pre/post medication administration and prn patency; Heparin 100 u/ml, 23m for implanted ports and Heparin 10u/ml, 571mfor all other central venous catheters.    Instructions May follow [facility] Anaphylaxis Protocol for First Dose Administration in the home: 0.9% NaCl at 25-50 ml/hr to maintain IV access for protocol meds. Epinephrine 0.3 ml IV/IM PRN and Benadryl 25-50 IV/IM PRN s/s of anaphylaxis.   Instructions [facility] Infusion Coordinator (RN) to assist per patient IV care needs in the home PRN.      11/14/17 1305     Follow-up Information    BoRaynelle BringMD In 2 weeks.   Specialty:  Urology Contact information: [address] [phone]        No Known Allergies  Consultations:  ID  Urology.    Procedures/Studies: Ct Abdomen Pelvis Wo Contrast  Addendum Date: 11/12/2017   ADDENDUM REPORT: 11/12/2017 05:14 ADDENDUM: These results were called by telephone at the time of interpretation on 11/11/2017 at 10:19 p.m. to Dr. SuCorinna Lineswho verbally acknowledged these results. Electronically Signed   By: DoFidela Salisbury.D.   On: 11/12/2017 05:14   Result Date: 11/12/2017 CLINICAL DATA:  Recurrent urinary tract infections. Increase creatinine. Right hydronephrosis found on recent abdominal ultrasound. EXAM: CT ABDOMEN AND PELVIS WITHOUT CONTRAST TECHNIQUE: Multidetector CT imaging of the abdomen and pelvis was performed following the standard protocol without IV contrast. COMPARISON:  Ultrasound 11/09/2017 FINDINGS: Lower chest: Hyperventilatory changes of bilateral lung bases. Small bilateral pleural effusions. Small pericardial effusion, maximum thickness of 12 mm posteriorly. Calcific atherosclerotic disease of the coronary arteries. Hepatobiliary: No focal liver abnormality is seen. No gallstones, gallbladder wall thickening, or biliary dilatation. Pancreas: Unremarkable. No pancreatic ductal dilatation or surrounding inflammatory changes. Spleen: Normal in size without focal abnormality. Adrenals/Urinary Tract: Normal adrenal glands. 1.5 cm obstructive calculus in the proximal right ureter, causing  moderate right hydronephrosis. Other 2 large calculi are seen within the right renal pelvis, the larger measuring 15 mm. There also other nonobstructive renal calculi within the right renal parenchyma, with a cluster of calculi within the lower pole of the right kidney measuring 10 mm. There is a mild right perinephric renal stranding. Additionally, in the distal right ureter there is an 8 mm calculus, approximately 1 cm from the vesicoureteral junction. No significant upstream ureteral dilation is seen, suggesting that this is a nonobstructive calculus. There are small nonobstructive calculi in the left kidney, the largest measuring 5 mm. No evidence of left hydroureter. There is a suprapubic catheter. The urinary bladder is collapsed around it and therefore suboptimally evaluated, with symmetric mucosal thickening. Stomach/Bowel: Stomach is  within normal limits. Appendix appears normal. No evidence of bowel wall thickening, distention, or inflammatory changes. Scattered colonic diverticulosis. Vascular/Lymphatic: Aortic atherosclerosis. No enlarged abdominal or pelvic lymph nodes. Reproductive: Prostate is unremarkable. Other: No abdominal wall hernia or abnormality. No abdominopelvic ascites. Musculoskeletal: Post right femoral pinning. Posttraumatic calcifications about the left hip. Small scattered lytic lesions in the left ilium. IMPRESSION: 1. 1.5 cm obstructive calculus in the proximal right ureter causing moderate right hydronephrosis. Additional large right renal calculi within the right renal pelvis and parenchyma. 2. 8 mm right distal ureteral calculus, without associated upstream ureteral dilation, suggesting that this is a nonobstructive calculus. 3. Left nephrolithiasis without evidence of hydronephrosis or hydroureter. 4. Diffuse thickening of the urinary bladder wall, decompressed around suprapubic catheter and therefore poorly evaluated. 5. Subcentimeter lytic lesions in the left ilium,  nonspecific. Please correlate to clinical history regarding malignancy capable of producing lytic lesions. 6. Small pericardial effusion. 7. Small bilateral pleural effusions. Bilateral lower lobe atelectasis. Infectious airspace consolidation is considered less likely. 8. Calcific atherosclerotic disease of the coronary arteries and aorta. Electronically Signed: By: Fidela Salisbury M.D. On: 11/11/2017 21:48   Dg Chest 2 View  Result Date: 11/08/2017 CLINICAL DATA:  Fever. EXAM: CHEST  2 VIEW COMPARISON:  10/18/2017 FINDINGS: The heart size and mediastinal contours are within normal limits. Both lungs are clear. The visualized skeletal structures are unremarkable. IMPRESSION: No active disease. Electronically Signed   By: Earle Gell M.D.   On: 11/08/2017 23:38   Ct Head Wo Contrast  Result Date: 10/17/2017 CLINICAL DATA:  Altered mental status with fever x3 days. EXAM: CT HEAD WITHOUT CONTRAST TECHNIQUE: Contiguous axial images were obtained from the base of the skull through the vertex without intravenous contrast. COMPARISON:  05/31/2016 FINDINGS: Brain: No evidence of acute infarction, hemorrhage, hydrocephalus, extra-axial collection or mass lesion/mass effect. Vascular: No hyperdense vessel or unexpected calcification. Skull: Normal. Negative for fracture or focal lesion. Sinuses/Orbits: No acute finding. Other: None IMPRESSION: No acute intracranial abnormality. Electronically Signed   By: Ashley Royalty M.D.   On: 10/17/2017 17:45   US Renal  Result Date: 11/09/2017 CLINICAL DATA:  Hydronephrosis. EXAM: RENAL / URINARY TRACT ULTRASOUND COMPLETE COMPARISON:  10/24/2017. FINDINGS: Right Kidney: Length: 13.5 cm. Mild to moderate right hydronephrosis again noted, similar to prior. Echogenic shadowing foci in the upper pole and central kidney suggest stones. Left Kidney: Length: 12.8 cm. Echogenicity within normal limits. No mass or hydronephrosis visualized. Bladder: Decompressed by a Foley  catheter. IMPRESSION: Similar appearance mild to moderate right hydronephrosis with potential right renal stones. Electronically Signed   By: [name] Stanley M.D.   On: 11/09/2017 13:49   US Renal  Result Date: 10/24/2017 CLINICAL DATA:  Leukocytosis EXAM: RENAL / URINARY TRACT ULTRASOUND COMPLETE COMPARISON:  Renal ultrasound 02/16/2016, CT 06/11/2015 FINDINGS: Right Kidney: Length: 12.0 cm. Mild hydronephrosis. 2 cm echogenic shadowing focus in the right upper pole may represent a stone but was not present on prior studies. Left Kidney: Length: 12.5 cm. Echogenicity within normal limits. No mass or hydronephrosis visualized. Bladder: Foley catheter.  Bladder empty. Bilateral pleural effusions IMPRESSION: Mild right hydronephrosis. 2 cm echogenic shadowing focus right upper pole most likely a kidney stone which was not present on prior studies. Electronically Signed   By: Franchot Gallo M.D.   On: 10/24/2017 10:12   Dg Chest Port 1 View  Result Date: 11/10/2017 CLINICAL DATA:  Respiratory distress EXAM: PORTABLE CHEST 1 VIEW COMPARISON:  11/09/2017 FINDINGS: Support devices are unchanged.  Low volumes with bibasilar atelectasis, improving since prior study. Heart is borderline in size. IMPRESSION: Improving bibasilar atelectasis. Electronically Signed   By: Rolm Baptise M.D.   On: 11/10/2017 07:05   Dg Chest Portable 1 View  Result Date: 11/09/2017 CLINICAL DATA:  Intubation and OG tube placement. EXAM: PORTABLE CHEST 1 VIEW COMPARISON:  Frontal and lateral views yesterday. FINDINGS: Endotracheal tube 4.6 cm from the carina. Tip and side port of the enteric tube below the diaphragm in the stomach. The cardiomediastinal contours are normal. Faint bibasilar atelectasis. Pulmonary vasculature is normal. No consolidation, pleural effusion, or pneumothorax. No acute osseous abnormalities are seen. IMPRESSION: 1. Endotracheal tube 4.6 cm from the carina. Enteric tube in place with tip and side-port in the  stomach. 2. Mild bibasilar atelectasis. Electronically Signed   By: Jeb Levering M.D.   On: 11/09/2017 01:04   Dg Chest Port 1 View  Result Date: 10/18/2017 CLINICAL DATA:  51 year old male with a history of acute respiratory failure and hypoxia EXAM: PORTABLE CHEST 1 VIEW COMPARISON:  10/17/2017 FINDINGS: Cardiomediastinal silhouette unchanged in size and contour. Endotracheal tube unchanged, terminating approximately 3.4 cm above the carina. Unchanged position of gastric tube which terminates out of the field of view. Adequate aeration of bilateral lungs with no confluent airspace disease. No pneumothorax. Surgical changes of the cervical region incompletely imaged. IMPRESSION: Similar appearance of the chest x-ray with low lung volumes, unchanged position of endotracheal tube and gastric tube. Electronically Signed   By: Corrie Mckusick D.O.   On: 10/18/2017 07:52   Dg Chest Portable 1 View  Result Date: 10/17/2017 CLINICAL DATA:  ETT and orogastric tube placement EXAM: PORTABLE CHEST 1 VIEW COMPARISON:  09/12/2017 FINDINGS: The tip of an endotracheal tube is seen approximately 2.5 cm above the carina. A gastric tube with side port is noted in the left upper quadrant of the abdomen, along the expected location the stomach. Heart is top-normal in size. Low volumes are slightly low without pneumonic consolidation, effusion or pneumothorax. Spinal fusion hardware is partially included at the base of the cervical spine. No acute osseous appearing abnormality. IMPRESSION: 1. Endotracheal tube tip approximately 2.5 cm above the carina with gastric tube and side-port noted in the expected location of the stomach. 2. Shallow inspiration without pneumonic consolidation or pneumothorax. No effusion or CHF. Electronically Signed   By: Ashley Royalty M.D.   On: 10/17/2017 15:32   Dg Abd Portable 1v  Result Date: 11/09/2017 CLINICAL DATA:  OG tube placement EXAM: PORTABLE ABDOMEN - 1 VIEW COMPARISON:  11/09/2017  FINDINGS: Repeat film shows the tip of the OG tube in the gastric antrum. Gaseous bowel distention noted mid abdomen. IMPRESSION: OG tube tip is in the distal stomach. Electronically Signed   By: [name] Stanley M.D.   On: 11/09/2017 12:25   Dg Abd Portable 1 View  Result Date: 11/09/2017 CLINICAL DATA:  NG tube placement. EXAM: PORTABLE ABDOMEN - 1 VIEW COMPARISON:  Radiograph 10 minutes prior FINDINGS: Tip of the enteric tube in the stomach, the side port is now in the region of the distal esophagus. Normal bowel gas pattern with moderate colonic stool. IMPRESSION: Enteric tube has been retracted since chest exam 10 minutes prior, side-port now in the region of the distal esophagus. Recommend advancement of 8 cm for optimal placement. Electronically Signed   By: Jeb Levering M.D.   On: 11/09/2017 01:06   Dg C-arm 1-60 Min-no Report  Result Date: 11/12/2017 Fluoroscopy was utilized by the requesting  physician.  No radiographic interpretation.       Subjective: no new complaints.    Discharge Exam: Vitals:   11/13/17 2144 11/14/17 0501  BP: (!) 159/108 (!) 163/108  Pulse: 90 78  Resp: 20 18  Temp: 98.4 F (36.9 C) 97.9 F (36.6 C)  SpO2: 100% 98%   Vitals:   11/13/17 0632 11/13/17 1328 11/13/17 2144 11/14/17 0501  BP:  (!) 142/97 (!) 159/108 (!) 163/108  Pulse:  91 90 78  Resp:  _0 Temp:  98.2 F (36.8 C) 98.4 F (36.9 C) 97.9 F (36.6 C)  TempSrc:  Oral Oral Oral  SpO2:  97% 100% 98%  Weight: 96 kg (211 lb 10.3 oz)   96.1 kg (211 lb 13.8 oz)  Height:        General: Pt is alert, awake, not in acute distress Cardiovascular: RRR, S1/S2 +, no rubs, no gallops Respiratory: CTA bilaterally, no wheezing, no rhonchi Abdominal: Soft, NT, ND, bowel sounds + Extremities: no edema, no cyanosis    The results of significant diagnostics from this hospitalization (including imaging, microbiology, ancillary and laboratory) are listed below for reference.      Microbiology: Recent Results (from the past 240 hour(s))  Culture, blood (Routine x 2)     Status: Abnormal   Collection Time: 11/08/17 10:55 PM  Result Value Ref Range Status   Specimen Description BLOOD LEFT WRIST  Final   Special Requests IN PEDIATRIC BOTTLE Blood Culture adequate volume  Final   Culture  Setup Time   Final    GRAM NEGATIVE RODS IN PEDIATRIC BOTTLE CRITICAL VALUE NOTED.  VALUE IS CONSISTENT WITH PREVIOUSLY REPORTED AND CALLED VALUE.    Culture (A)  Final    KLEBSIELLA PNEUMONIAE SUSCEPTIBILITIES PERFORMED ON PREVIOUS CULTURE WITHIN THE LAST 5 DAYS. Performed at [facility], [place] [address] [place], [place] [address]    Report Status 11/12/2017 FINAL  Final  Culture, blood (Routine x 2)     Status: Abnormal   Collection Time: 11/08/17 11:55 PM  Result Value Ref Range Status   Specimen Description BLOOD LEFT ANTECUBITAL  Final   Special Requests   Final    BOTTLES DRAWN AEROBIC AND ANAEROBIC Blood Culture adequate volume   Culture  Setup Time   Final    GRAM NEGATIVE RODS ANAEROBIC BOTTLE ONLY CRITICAL RESULT CALLED TO, READ BACK BY AND VERIFIED WITH: B.GREEN,PHARMD AT 0108 ON 11/10/17 BY G.MCADOO Performed at [facility], [place] [address], [place], [place] [address]    Culture KLEBSIELLA PNEUMONIAE (A)  Final   Report Status 11/12/2017 FINAL  Final   Organism ID, Bacteria KLEBSIELLA PNEUMONIAE  Final      Susceptibility   Klebsiella pneumoniae - MIC*    AMPICILLIN >=32 RESISTANT Resistant     CEFAZOLIN <=4 SENSITIVE Sensitive     CEFEPIME <=1 SENSITIVE Sensitive     CEFTAZIDIME <=1 SENSITIVE Sensitive     CEFTRIAXONE <=1 SENSITIVE Sensitive     CIPROFLOXACIN >=4 RESISTANT Resistant     GENTAMICIN <=1 SENSITIVE Sensitive     IMIPENEM <=0.25 SENSITIVE Sensitive     TRIMETH/SULFA <=20 SENSITIVE Sensitive     AMPICILLIN/SULBACTAM 8 SENSITIVE Sensitive     PIP/TAZO <=4 SENSITIVE Sensitive     Extended ESBL NEGATIVE Sensitive     *  KLEBSIELLA PNEUMONIAE  Blood Culture ID Panel (Reflexed)     Status: Abnormal   Collection Time: 11/08/17 11:55 PM  Result Value Ref Range  Status   Enterococcus species NOT DETECTED NOT DETECTED Final   Listeria monocytogenes NOT DETECTED NOT DETECTED Final   Staphylococcus species NOT DETECTED NOT DETECTED Final   Staphylococcus aureus NOT DETECTED NOT DETECTED Final   Streptococcus species NOT DETECTED NOT DETECTED Final   Streptococcus agalactiae NOT DETECTED NOT DETECTED Final   Streptococcus pneumoniae NOT DETECTED NOT DETECTED Final   Streptococcus pyogenes NOT DETECTED NOT DETECTED Final   Acinetobacter baumannii NOT DETECTED NOT DETECTED Final   Enterobacteriaceae species DETECTED (A) NOT DETECTED Final    Comment: Enterobacteriaceae represent a large family of gram-negative bacteria, not a single organism. CRITICAL RESULT CALLED TO, READ BACK BY AND VERIFIED WITH: B.GREEN,PHARMD AT 0108 ON 11/10/17 BY G.MCADOO    Enterobacter cloacae complex NOT DETECTED NOT DETECTED Final   Escherichia coli NOT DETECTED NOT DETECTED Final   Klebsiella oxytoca NOT DETECTED NOT DETECTED Final   Klebsiella pneumoniae DETECTED (A) NOT DETECTED Final    Comment: CRITICAL RESULT CALLED TO, READ BACK BY AND VERIFIED WITH: B.GREEN,PHARMD AT 0108 ON 11/10/17 BY G.MCADOO    Proteus species NOT DETECTED NOT DETECTED Final   Serratia marcescens NOT DETECTED NOT DETECTED Final   Carbapenem resistance NOT DETECTED NOT DETECTED Final   Haemophilus influenzae NOT DETECTED NOT DETECTED Final   Neisseria meningitidis NOT DETECTED NOT DETECTED Final   Pseudomonas aeruginosa NOT DETECTED NOT DETECTED Final   Candida albicans NOT DETECTED NOT DETECTED Final   Candida glabrata NOT DETECTED NOT DETECTED Final   Candida krusei NOT DETECTED NOT DETECTED Final   Candida parapsilosis NOT DETECTED NOT DETECTED Final   Candida tropicalis NOT DETECTED NOT DETECTED Final    Comment: Performed at [facility],  [place]. [address], [place], [place] [address]  Urine culture     Status: Abnormal   Collection Time: 11/09/17  1:44 AM  Result Value Ref Range Status   Specimen Description URINE, CATHETERIZED  Final   Special Requests NONE  Final   Culture (A)  Final    >=100,000 COLONIES/mL PSEUDOMONAS AERUGINOSA >=100,000 COLONIES/mL KLEBSIELLA PNEUMONIAE PSEUDOMONAS AERUGINOSA MULTI-DRUG RESISTANT ORGANISM    Report Status 11/12/2017 FINAL  Final   Organism ID, Bacteria KLEBSIELLA PNEUMONIAE (A)  Final   Organism ID, Bacteria PSEUDOMONAS AERUGINOSA (A)  Final      Susceptibility   Klebsiella pneumoniae - MIC*    AMPICILLIN >=32 RESISTANT Resistant     CEFAZOLIN <=4 SENSITIVE Sensitive     CEFTRIAXONE <=1 SENSITIVE Sensitive     CIPROFLOXACIN >=4 RESISTANT Resistant     GENTAMICIN <=1 SENSITIVE Sensitive     IMIPENEM <=0.25 SENSITIVE Sensitive     NITROFURANTOIN 256 RESISTANT Resistant     TRIMETH/SULFA 40 SENSITIVE Sensitive     AMPICILLIN/SULBACTAM 8 SENSITIVE Sensitive     PIP/TAZO <=4 SENSITIVE Sensitive     Extended ESBL NEGATIVE Sensitive     * >=100,000 COLONIES/mL KLEBSIELLA PNEUMONIAE   Pseudomonas aeruginosa - MIC*    CEFTAZIDIME >=64 RESISTANT Resistant     CIPROFLOXACIN 2 INTERMEDIATE Intermediate     GENTAMICIN 2 SENSITIVE Sensitive     IMIPENEM >=16 RESISTANT Resistant     CEFEPIME >=64 RESISTANT Resistant     * >=100,000 COLONIES/mL PSEUDOMONAS AERUGINOSA  MRSA PCR Screening     Status: None   Collection Time: 11/09/17  3:42 AM  Result Value Ref Range Status   MRSA by PCR NEGATIVE NEGATIVE Final    Comment:        The GeneXpert MRSA Assay (FDA  approved for NASAL specimens only), is one component of a comprehensive MRSA colonization surveillance program. It is not intended to diagnose MRSA infection nor to guide or monitor treatment for MRSA infections.   Urine Culture     Status: None   Collection Time: 11/12/17  2:03 PM  Result Value Ref Range Status   Specimen  Description URINE, RANDOM CYSTOSCOPY RIGHT KIDNEY  Final   Special Requests NONE  Final   Culture   Final    NO GROWTH Performed at [facility], [address], [place], [place] [address]    Report Status 11/13/2017 FINAL  Final  Culture, blood (routine x 2)     Status: None (Preliminary result)   Collection Time: 11/13/17  4:30 PM  Result Value Ref Range Status   Specimen Description BLOOD RIGHT ANTECUBITAL  Final   Special Requests   Final    BOTTLES DRAWN AEROBIC AND ANAEROBIC Blood Culture adequate volume   Culture   Final    NO GROWTH < 24 HOURS Performed at [facility], [place] [address], [place], [place] [address]    Report Status PENDING  Incomplete  Culture, blood (routine x 2)     Status: None (Preliminary result)   Collection Time: 11/13/17  4:43 PM  Result Value Ref Range Status   Specimen Description BLOOD RIGHT HAND  Final   Special Requests   Final    BOTTLES DRAWN AEROBIC AND ANAEROBIC Blood Culture adequate volume   Culture   Final    NO GROWTH < 24 HOURS Performed at [facility], [place] [address], [place],  [address]    Report Status PENDING  Incomplete     Labs: BNP (last 3 results) Recent Labs    09/13/17 0408  BNP 99.7   Basic Metabolic Panel: Recent Labs  Lab 11/09/17 0354 11/09/17 0936 11/09/17 1638 11/10/17 0328 11/11/17 0321 11/12/17 0347 11/13/17 0846  NA 140  --   --  137 139 142 136  K 4.3  --   --  3.6 3.7 3.5 4.0  CL 110  --   --  106 107 109 104  CO2 20*  --   --  _0 GLUCOSE 139*  --   --  168* 84 93 167*  BUN 20  --   --  _1 CREATININE 1.37*  --   --  1.17 1.08 1.12 0.89  CALCIUM 8.2*  --   --  8.0* 8.2* 8.7* 8.9  MG 1.4* 2.5* 2.3 2.1 1.8  --  1.9  PHOS 3.3 2.3* 2.1* 1.9* 2.6  --   --    Liver Function Tests: Recent Labs  Lab 11/08/17 2255  AST 28  ALT 41  ALKPHOS 135*  BILITOT 1.4*  PROT 8.1  ALBUMIN 3.6   No results for input(s): LIPASE, AMYLASE in the last 168 hours. No  results for input(s): AMMONIA in the last 168 hours. CBC: Recent Labs  Lab 11/08/17 2255 11/09/17 0354 11/10/17 0328 11/11/17 0321 11/12/17 0347 11/13/17 0846  WBC 27.7* 18.3* 14.8* 11.8* 9.1 9.8  NEUTROABS 21.1*  --   --   --   --  6.8  HGB 8.3* 10.2* 7.9* 8.1* 8.5* 9.5*  HCT 25.8* 32.2* 24.7* 25.2* 27.1* 29.3*  MCV 92.5 93.6 92.5 91.3 91.2 88.8  PLT 405* 283 247 311 348 487*   Cardiac Enzymes: No results for input(s): CKTOTAL, CKMB, CKMBINDEX, TROPONINI in the  last 168 hours. BNP: Invalid input(s): POCBNP CBG: Recent Labs  Lab 11/13/17 1123 11/13/17 1644 11/13/17 2142 11/14/17 0732 11/14/17 1126  GLUCAP 188* 128* 206* 128* 127*   D-Dimer No results for input(s): DDIMER in the last 72 hours. Hgb A1c No results for input(s): HGBA1C in the last 72 hours. Lipid Profile No results for input(s): CHOL, HDL, LDLCALC, TRIG, CHOLHDL, LDLDIRECT in the last 72 hours. Thyroid function studies No results for input(s): TSH, T4TOTAL, T3FREE, THYROIDAB in the last 72 hours.  Invalid input(s): FREET3 Anemia work up No results for input(s): VITAMINB12, FOLATE, FERRITIN, TIBC, IRON, RETICCTPCT in the last 72 hours. Urinalysis    Component Value Date/Time   COLORURINE YELLOW 11/09/2017 0144   APPEARANCEUR CLOUDY (A) 11/09/2017 0144   LABSPEC 1.012 11/09/2017 0144   PHURINE 5.0 11/09/2017 0144   GLUCOSEU NEGATIVE 11/09/2017 0144   HGBUR MODERATE (A) 11/09/2017 0144   BILIRUBINUR NEGATIVE 11/09/2017 0144   KETONESUR NEGATIVE 11/09/2017 0144   PROTEINUR NEGATIVE 11/09/2017 0144   UROBILINOGEN 0.2 08/23/2015 0247   NITRITE NEGATIVE 11/09/2017 0144   LEUKOCYTESUR LARGE (A) 11/09/2017 0144   Sepsis Labs Invalid input(s): PROCALCITONIN,  WBC,  LACTICIDVEN Microbiology Recent Results (from the past 240 hour(s))  Culture, blood (Routine x 2)     Status: Abnormal   Collection Time: 11/08/17 10:55 PM  Result Value Ref Range Status   Specimen Description BLOOD LEFT WRIST  Final    Special Requests IN PEDIATRIC BOTTLE Blood Culture adequate volume  Final   Culture  Setup Time   Final    GRAM NEGATIVE RODS IN PEDIATRIC BOTTLE CRITICAL VALUE NOTED.  VALUE IS CONSISTENT WITH PREVIOUSLY REPORTED AND CALLED VALUE.    Culture (A)  Final    KLEBSIELLA PNEUMONIAE SUSCEPTIBILITIES PERFORMED ON PREVIOUS CULTURE WITHIN THE LAST 5 DAYS. Performed at [facility], [place] [address] [place], [place] [address]    Report Status 11/12/2017 FINAL  Final  Culture, blood (Routine x 2)     Status: Abnormal   Collection Time: 11/08/17 11:55 PM  Result Value Ref Range Status   Specimen Description BLOOD LEFT ANTECUBITAL  Final   Special Requests   Final    BOTTLES DRAWN AEROBIC AND ANAEROBIC Blood Culture adequate volume   Culture  Setup Time   Final    GRAM NEGATIVE RODS ANAEROBIC BOTTLE ONLY CRITICAL RESULT CALLED TO, READ BACK BY AND VERIFIED WITH: B.GREEN,PHARMD AT 0108 ON 11/10/17 BY G.MCADOO Performed at [facility], [place] [address] [place], [place] [address]    Culture KLEBSIELLA PNEUMONIAE (A)  Final   Report Status 11/12/2017 FINAL  Final   Organism ID, Bacteria KLEBSIELLA PNEUMONIAE  Final      Susceptibility   Klebsiella pneumoniae - MIC*    AMPICILLIN >=32 RESISTANT Resistant     CEFAZOLIN <=4 SENSITIVE Sensitive     CEFEPIME <=1 SENSITIVE Sensitive     CEFTAZIDIME <=1 SENSITIVE Sensitive     CEFTRIAXONE <=1 SENSITIVE Sensitive     CIPROFLOXACIN >=4 RESISTANT Resistant     GENTAMICIN <=1 SENSITIVE Sensitive     IMIPENEM <=0.25 SENSITIVE Sensitive     TRIMETH/SULFA <=20 SENSITIVE Sensitive     AMPICILLIN/SULBACTAM 8 SENSITIVE Sensitive     PIP/TAZO <=4 SENSITIVE Sensitive     Extended ESBL NEGATIVE Sensitive     * KLEBSIELLA PNEUMONIAE  Blood Culture ID Panel (Reflexed)     Status: Abnormal   Collection Time: 11/08/17 11:55 PM  Result Value Ref Range Status  Enterococcus species NOT DETECTED NOT DETECTED Final   Listeria monocytogenes NOT DETECTED  NOT DETECTED Final   Staphylococcus species NOT DETECTED NOT DETECTED Final   Staphylococcus aureus NOT DETECTED NOT DETECTED Final   Streptococcus species NOT DETECTED NOT DETECTED Final   Streptococcus agalactiae NOT DETECTED NOT DETECTED Final   Streptococcus pneumoniae NOT DETECTED NOT DETECTED Final   Streptococcus pyogenes NOT DETECTED NOT DETECTED Final   Acinetobacter baumannii NOT DETECTED NOT DETECTED Final   Enterobacteriaceae species DETECTED (A) NOT DETECTED Final    Comment: Enterobacteriaceae represent a large family of gram-negative bacteria, not a single organism. CRITICAL RESULT CALLED TO, READ BACK BY AND VERIFIED WITH: B.GREEN,PHARMD AT 0108 ON 11/10/17 BY G.MCADOO    Enterobacter cloacae complex NOT DETECTED NOT DETECTED Final   Escherichia coli NOT DETECTED NOT DETECTED Final   Klebsiella oxytoca NOT DETECTED NOT DETECTED Final   Klebsiella pneumoniae DETECTED (A) NOT DETECTED Final    Comment: CRITICAL RESULT CALLED TO, READ BACK BY AND VERIFIED WITH: B.GREEN,PHARMD AT 0108 ON 11/10/17 BY G.MCADOO    Proteus species NOT DETECTED NOT DETECTED Final   Serratia marcescens NOT DETECTED NOT DETECTED Final   Carbapenem resistance NOT DETECTED NOT DETECTED Final   Haemophilus influenzae NOT DETECTED NOT DETECTED Final   Neisseria meningitidis NOT DETECTED NOT DETECTED Final   Pseudomonas aeruginosa NOT DETECTED NOT DETECTED Final   Candida albicans NOT DETECTED NOT DETECTED Final   Candida glabrata NOT DETECTED NOT DETECTED Final   Candida krusei NOT DETECTED NOT DETECTED Final   Candida parapsilosis NOT DETECTED NOT DETECTED Final   Candida tropicalis NOT DETECTED NOT DETECTED Final    Comment: Performed at [facility], [place], [place], [place] [address]  Urine culture     Status: Abnormal   Collection Time: 11/09/17  1:44 AM  Result Value Ref Range Status   Specimen Description URINE, CATHETERIZED  Final   Special Requests NONE  Final   Culture (A)   Final    >=100,000 COLONIES/mL PSEUDOMONAS AERUGINOSA >=100,000 COLONIES/mL KLEBSIELLA PNEUMONIAE PSEUDOMONAS AERUGINOSA MULTI-DRUG RESISTANT ORGANISM    Report Status 11/12/2017 FINAL  Final   Organism ID, Bacteria KLEBSIELLA PNEUMONIAE (A)  Final   Organism ID, Bacteria PSEUDOMONAS AERUGINOSA (A)  Final      Susceptibility   Klebsiella pneumoniae - MIC*    AMPICILLIN >=32 RESISTANT Resistant     CEFAZOLIN <=4 SENSITIVE Sensitive     CEFTRIAXONE <=1 SENSITIVE Sensitive     CIPROFLOXACIN >=4 RESISTANT Resistant     GENTAMICIN <=1 SENSITIVE Sensitive     IMIPENEM <=0.25 SENSITIVE Sensitive     NITROFURANTOIN 256 RESISTANT Resistant     TRIMETH/SULFA 40 SENSITIVE Sensitive     AMPICILLIN/SULBACTAM 8 SENSITIVE Sensitive     PIP/TAZO <=4 SENSITIVE Sensitive     Extended ESBL NEGATIVE Sensitive     * >=100,000 COLONIES/mL KLEBSIELLA PNEUMONIAE   Pseudomonas aeruginosa - MIC*    CEFTAZIDIME >=64 RESISTANT Resistant     CIPROFLOXACIN 2 INTERMEDIATE Intermediate     GENTAMICIN 2 SENSITIVE Sensitive     IMIPENEM >=16 RESISTANT Resistant     CEFEPIME >=64 RESISTANT Resistant     * >=100,000 COLONIES/mL PSEUDOMONAS AERUGINOSA  MRSA PCR Screening     Status: None   Collection Time: 11/09/17  3:42 AM  Result Value Ref Range Status   MRSA by PCR NEGATIVE NEGATIVE Final    Comment:        The GeneXpert MRSA Assay (FDA approved for  NASAL specimens only), is one component of a comprehensive MRSA colonization surveillance program. It is not intended to diagnose MRSA infection nor to guide or monitor treatment for MRSA infections.   Urine Culture     Status: None   Collection Time: 11/12/17  2:03 PM  Result Value Ref Range Status   Specimen Description URINE, RANDOM CYSTOSCOPY RIGHT KIDNEY  Final   Special Requests NONE  Final   Culture   Final    NO GROWTH Performed at [facility], [address], [place], [place] [address]    Report Status 11/13/2017 FINAL  Final  Culture,  blood (routine x 2)     Status: None (Preliminary result)   Collection Time: 11/13/17  4:30 PM  Result Value Ref Range Status   Specimen Description BLOOD RIGHT ANTECUBITAL  Final   Special Requests   Final    BOTTLES DRAWN AEROBIC AND ANAEROBIC Blood Culture adequate volume   Culture   Final    NO GROWTH < 24 HOURS Performed at [facility], [place] [address], [place], [place] [address]    Report Status PENDING  Incomplete  Culture, blood (routine x 2)     Status: None (Preliminary result)   Collection Time: 11/13/17  4:43 PM  Result Value Ref Range Status   Specimen Description BLOOD RIGHT HAND  Final   Special Requests   Final    BOTTLES DRAWN AEROBIC AND ANAEROBIC Blood Culture adequate volume   Culture   Final    NO GROWTH < 24 HOURS Performed at [facility], [place] [address] [place], [place] [address]    Report Status PENDING  Incomplete     Time coordinating discharge: Over 30 minutes  SIGNED:   Hosie Poisson, MD  [facility] 11/14/2017, 1:23 PM Pager   If 7PM-7AM, please contact night-coverage [email or web address] Password [email or web address]

## 2017-11-15 NOTE — Anesthesia Postprocedure Evaluation (Signed)
Anesthesia Post Note  Patient: Phillip [name]  Procedure(s) Performed: CYSTOSCOPY WITH RETROGRADE PYELOGRAM/URETERAL STENT PLACEMENT (Right )     Patient location during evaluation: PACU Anesthesia Type: General Level of consciousness: awake and alert Pain management: pain level controlled Vital Signs Assessment: post-procedure vital signs reviewed and stable Respiratory status: spontaneous breathing, nonlabored ventilation, respiratory function stable and patient connected to nasal cannula oxygen Cardiovascular status: blood pressure returned to baseline and stable Postop Assessment: no apparent nausea or vomiting Anesthetic complications: no    Last Vitals:  Vitals:   11/14/17 0501 11/14/17 1400  BP: (!) 163/108 (!) 159/94  Pulse: 78 87  Resp: 18 16  Temp: 36.6 C 37.2 C  SpO2: 98% 100%    Last Pain:  Vitals:   11/14/17 1400  TempSrc: Oral  PainSc:    Pain Goal:                 Riccardo Dubin

## 2017-11-18 LAB — CULTURE, BLOOD (ROUTINE X 2)
CULTURE: NO GROWTH
CULTURE: NO GROWTH
SPECIAL REQUESTS: ADEQUATE
Special Requests: ADEQUATE

## 2017-11-28 ENCOUNTER — Emergency Department (HOSPITAL_COMMUNITY): Payer: Medicare Other

## 2017-11-28 ENCOUNTER — Encounter (HOSPITAL_COMMUNITY): Payer: Self-pay | Admitting: *Deleted

## 2017-11-28 ENCOUNTER — Other Ambulatory Visit: Payer: Self-pay

## 2017-11-28 ENCOUNTER — Inpatient Hospital Stay (HOSPITAL_COMMUNITY)
Admission: EM | Admit: 2017-11-28 | Discharge: 2017-11-30 | DRG: 070 | Disposition: A | Discharge status: Skilled Nursing Facility | Payer: Medicare Other | Attending: Family Medicine | Admitting: Family Medicine

## 2017-11-28 DIAGNOSIS — B3749 Other urogenital candidiasis: Secondary | ICD-10-CM | POA: Diagnosis not present

## 2017-11-28 DIAGNOSIS — R5383 Other fatigue: Secondary | ICD-10-CM | POA: Diagnosis present

## 2017-11-28 DIAGNOSIS — N200 Calculus of kidney: Secondary | ICD-10-CM | POA: Diagnosis present

## 2017-11-28 DIAGNOSIS — R7881 Bacteremia: Secondary | ICD-10-CM | POA: Diagnosis present

## 2017-11-28 DIAGNOSIS — I110 Hypertensive heart disease with heart failure: Secondary | ICD-10-CM | POA: Diagnosis present

## 2017-11-28 DIAGNOSIS — N179 Acute kidney failure, unspecified: Secondary | ICD-10-CM | POA: Diagnosis present

## 2017-11-28 DIAGNOSIS — B961 Klebsiella pneumoniae [K. pneumoniae] as the cause of diseases classified elsewhere: Secondary | ICD-10-CM | POA: Diagnosis present

## 2017-11-28 DIAGNOSIS — G8250 Quadriplegia, unspecified: Secondary | ICD-10-CM | POA: Diagnosis present

## 2017-11-28 DIAGNOSIS — L89212 Pressure ulcer of right hip, stage 2: Secondary | ICD-10-CM | POA: Diagnosis present

## 2017-11-28 DIAGNOSIS — L8990 Pressure ulcer of unspecified site, unspecified stage: Secondary | ICD-10-CM | POA: Diagnosis present

## 2017-11-28 DIAGNOSIS — Y92009 Unspecified place in unspecified non-institutional (private) residence as the place of occurrence of the external cause: Secondary | ICD-10-CM

## 2017-11-28 DIAGNOSIS — J90 Pleural effusion, not elsewhere classified: Secondary | ICD-10-CM | POA: Diagnosis present

## 2017-11-28 DIAGNOSIS — L89223 Pressure ulcer of left hip, stage 3: Secondary | ICD-10-CM | POA: Diagnosis present

## 2017-11-28 DIAGNOSIS — IMO0001 Reserved for inherently not codable concepts without codable children: Secondary | ICD-10-CM | POA: Diagnosis present

## 2017-11-28 DIAGNOSIS — Z8249 Family history of ischemic heart disease and other diseases of the circulatory system: Secondary | ICD-10-CM | POA: Diagnosis not present

## 2017-11-28 DIAGNOSIS — G92 Toxic encephalopathy: Secondary | ICD-10-CM | POA: Diagnosis present

## 2017-11-28 DIAGNOSIS — Z720 Tobacco use: Secondary | ICD-10-CM | POA: Diagnosis present

## 2017-11-28 DIAGNOSIS — B3741 Candidal cystitis and urethritis: Secondary | ICD-10-CM | POA: Diagnosis present

## 2017-11-28 DIAGNOSIS — G9341 Metabolic encephalopathy: Secondary | ICD-10-CM | POA: Diagnosis present

## 2017-11-28 DIAGNOSIS — S14105A Unspecified injury at C5 level of cervical spinal cord, initial encounter: Secondary | ICD-10-CM | POA: Diagnosis present

## 2017-11-28 DIAGNOSIS — N319 Neuromuscular dysfunction of bladder, unspecified: Secondary | ICD-10-CM | POA: Diagnosis present

## 2017-11-28 DIAGNOSIS — D649 Anemia, unspecified: Secondary | ICD-10-CM | POA: Diagnosis present

## 2017-11-28 DIAGNOSIS — R569 Unspecified convulsions: Secondary | ICD-10-CM | POA: Diagnosis present

## 2017-11-28 DIAGNOSIS — I5032 Chronic diastolic (congestive) heart failure: Secondary | ICD-10-CM | POA: Diagnosis present

## 2017-11-28 DIAGNOSIS — A498 Other bacterial infections of unspecified site: Secondary | ICD-10-CM | POA: Diagnosis present

## 2017-11-28 DIAGNOSIS — K219 Gastro-esophageal reflux disease without esophagitis: Secondary | ICD-10-CM | POA: Diagnosis present

## 2017-11-28 DIAGNOSIS — F329 Major depressive disorder, single episode, unspecified: Secondary | ICD-10-CM | POA: Diagnosis present

## 2017-11-28 DIAGNOSIS — E785 Hyperlipidemia, unspecified: Secondary | ICD-10-CM | POA: Diagnosis present

## 2017-11-28 DIAGNOSIS — T50905A Adverse effect of unspecified drugs, medicaments and biological substances, initial encounter: Secondary | ICD-10-CM | POA: Diagnosis present

## 2017-11-28 DIAGNOSIS — G904 Autonomic dysreflexia: Secondary | ICD-10-CM | POA: Diagnosis present

## 2017-11-28 DIAGNOSIS — S14105S Unspecified injury at C5 level of cervical spinal cord, sequela: Secondary | ICD-10-CM | POA: Diagnosis not present

## 2017-11-28 DIAGNOSIS — Z87440 Personal history of urinary (tract) infections: Secondary | ICD-10-CM | POA: Diagnosis not present

## 2017-11-28 DIAGNOSIS — E860 Dehydration: Secondary | ICD-10-CM | POA: Diagnosis present

## 2017-11-28 DIAGNOSIS — B965 Pseudomonas (aeruginosa) (mallei) (pseudomallei) as the cause of diseases classified elsewhere: Secondary | ICD-10-CM | POA: Diagnosis present

## 2017-11-28 DIAGNOSIS — R4182 Altered mental status, unspecified: Secondary | ICD-10-CM | POA: Diagnosis not present

## 2017-11-28 DIAGNOSIS — F32A Depression, unspecified: Secondary | ICD-10-CM | POA: Diagnosis present

## 2017-11-28 DIAGNOSIS — F1020 Alcohol dependence, uncomplicated: Secondary | ICD-10-CM | POA: Diagnosis present

## 2017-11-28 DIAGNOSIS — R410 Disorientation, unspecified: Secondary | ICD-10-CM

## 2017-11-28 DIAGNOSIS — L89309 Pressure ulcer of unspecified buttock, unspecified stage: Secondary | ICD-10-CM | POA: Diagnosis not present

## 2017-11-28 LAB — COMPREHENSIVE METABOLIC PANEL
ALK PHOS: 127 U/L — AB (ref 38–126)
ALT: 33 U/L (ref 17–63)
ANION GAP: 9 (ref 5–15)
AST: 19 U/L (ref 15–41)
Albumin: 3.5 g/dL (ref 3.5–5.0)
BUN: 14 mg/dL (ref 6–20)
CALCIUM: 9.1 mg/dL (ref 8.9–10.3)
CO2: 27 mmol/L (ref 22–32)
Chloride: 105 mmol/L (ref 101–111)
Creatinine, Ser: 1.15 mg/dL (ref 0.61–1.24)
GFR calc non Af Amer: >60 mL/min (ref >60)
Glucose, Bld: 91 mg/dL (ref 65–99)
Potassium: 3.6 mmol/L (ref 3.5–5.1)
SODIUM: 141 mmol/L (ref 135–145)
TOTAL PROTEIN: 7.7 g/dL (ref 6.5–8.1)
Total Bilirubin: 0.7 mg/dL (ref 0.3–1.2)

## 2017-11-28 LAB — URINALYSIS, ROUTINE W REFLEX MICROSCOPIC
BILIRUBIN URINE: NEGATIVE
GLUCOSE, UA: NEGATIVE mg/dL
KETONES UR: NEGATIVE mg/dL
NITRITE: NEGATIVE
PH: 6 (ref 5.0–8.0)
Protein, ur: 30 mg/dL — AB
Specific Gravity, Urine: 1.011 (ref 1.005–1.030)
Squamous Epithelial / LPF: NONE SEEN

## 2017-11-28 LAB — CBC WITH DIFFERENTIAL/PLATELET
BASOS ABS: 0 10*3/uL (ref 0.0–0.1)
BASOS PCT: 0 %
Eosinophils Absolute: 0.5 10*3/uL (ref 0.0–0.7)
Eosinophils Relative: 4 %
HEMATOCRIT: 35.6 % — AB (ref 39.0–52.0)
HEMOGLOBIN: 11.2 g/dL — AB (ref 13.0–17.0)
Lymphocytes Relative: 41 %
Lymphs Abs: 4.7 10*3/uL — ABNORMAL HIGH (ref 0.7–4.0)
MCH: 28.6 pg (ref 26.0–34.0)
MCHC: 31.5 g/dL (ref 30.0–36.0)
MCV: 91 fL (ref 78.0–100.0)
MONOS PCT: 7 %
Monocytes Absolute: 0.8 10*3/uL (ref 0.1–1.0)
NEUTROS ABS: 5.4 10*3/uL (ref 1.7–7.7)
NEUTROS PCT: 48 %
Platelets: 319 10*3/uL (ref 150–400)
RBC: 3.91 MIL/uL — ABNORMAL LOW (ref 4.22–5.81)
RDW: 15.6 % — ABNORMAL HIGH (ref 11.5–15.5)
WBC: 11.3 10*3/uL — ABNORMAL HIGH (ref 4.0–10.5)

## 2017-11-28 LAB — CBG MONITORING, ED: GLUCOSE-CAPILLARY: 95 mg/dL (ref 65–99)

## 2017-11-28 LAB — I-STAT CG4 LACTIC ACID, ED
LACTIC ACID, VENOUS: 0.9 mmol/L (ref 0.5–1.9)
Lactic Acid, Venous: 0.71 mmol/L (ref 0.5–1.9)

## 2017-11-28 LAB — RAPID URINE DRUG SCREEN, HOSP PERFORMED
AMPHETAMINES: NOT DETECTED
BARBITURATES: NOT DETECTED
BENZODIAZEPINES: NOT DETECTED
COCAINE: NOT DETECTED
Opiates: NOT DETECTED
TETRAHYDROCANNABINOL: POSITIVE — AB

## 2017-11-28 LAB — ETHANOL: Alcohol, Ethyl (B): <10 mg/dL (ref <10)

## 2017-11-28 LAB — LACTIC ACID, PLASMA
Lactic Acid, Venous: 0.8 mmol/L (ref 0.5–1.9)
Lactic Acid, Venous: 0.8 mmol/L (ref 0.5–1.9)

## 2017-11-28 LAB — AMMONIA: AMMONIA: 34 umol/L (ref 9–35)

## 2017-11-28 MED ORDER — ENOXAPARIN SODIUM 40 MG/0.4ML ~~LOC~~ SOLN
40.0000 mg | Freq: Every day | SUBCUTANEOUS | Status: DC
  Administered 2017-11-28 – 2017-11-30 (×3): 40 mg via SUBCUTANEOUS
  Filled 2017-11-28 (×3): qty 0.4

## 2017-11-28 MED ORDER — SODIUM CHLORIDE 0.9 % IV SOLN
INTRAVENOUS | Status: DC
Start: 2017-11-28 — End: 2017-11-29
  Administered 2017-11-28 (×2): via INTRAVENOUS

## 2017-11-28 MED ORDER — METOPROLOL TARTRATE 12.5 MG HALF TABLET
12.5000 mg | ORAL_TABLET | Freq: Two times a day (BID) | ORAL | Status: DC
  Administered 2017-11-28 – 2017-11-30 (×4): 12.5 mg via ORAL
  Filled 2017-11-28 (×4): qty 1

## 2017-11-28 MED ORDER — CLONIDINE HCL 0.1 MG PO TABS
0.1000 mg | ORAL_TABLET | Freq: Three times a day (TID) | ORAL | Status: DC
  Administered 2017-11-28 – 2017-11-30 (×5): 0.1 mg via ORAL
  Filled 2017-11-28 (×6): qty 1

## 2017-11-28 MED ORDER — CEFTOLOZANE-TAZOBACTAM 1.5 (1-0.5) G IV SOLR
1.5000 g | Freq: Three times a day (TID) | INTRAVENOUS | Status: DC
  Administered 2017-11-28 – 2017-11-30 (×6): 1.5 g via INTRAVENOUS
  Filled 2017-11-28 (×7): qty 11.4

## 2017-11-28 MED ORDER — GABAPENTIN 300 MG PO CAPS
300.0000 mg | ORAL_CAPSULE | Freq: Three times a day (TID) | ORAL | Status: DC
  Administered 2017-11-28 – 2017-11-30 (×6): 300 mg via ORAL
  Filled 2017-11-28 (×7): qty 1

## 2017-11-28 MED ORDER — DEXTROSE 5 % IV SOLN: 2.0000 g | Freq: Once | INTRAVENOUS | Status: DC

## 2017-11-28 MED ORDER — DARIFENACIN HYDROBROMIDE ER 7.5 MG PO TB24
7.5000 mg | ORAL_TABLET | Freq: Every day | ORAL | Status: DC
  Administered 2017-11-29 – 2017-11-30 (×2): 7.5 mg via ORAL
  Filled 2017-11-28 (×4): qty 1

## 2017-11-28 MED ORDER — BACLOFEN 10 MG PO TABS
10.0000 mg | ORAL_TABLET | Freq: Three times a day (TID) | ORAL | Status: DC
  Administered 2017-11-29 – 2017-11-30 (×5): 10 mg via ORAL
  Filled 2017-11-28 (×5): qty 1

## 2017-11-28 MED ORDER — SODIUM CHLORIDE 0.9 % IV SOLN
1.5000 g | Freq: Once | INTRAVENOUS | Status: AC
  Administered 2017-11-28: 1.5 g via INTRAVENOUS
  Filled 2017-11-28: qty 11.4

## 2017-11-28 MED ORDER — ONDANSETRON HCL 4 MG/2ML IJ SOLN: 4.0000 mg | Freq: Four times a day (QID) | INTRAMUSCULAR | Status: DC | PRN

## 2017-11-28 MED ORDER — SENNOSIDES-DOCUSATE SODIUM 8.6-50 MG PO TABS
2.0000 | ORAL_TABLET | Freq: Two times a day (BID) | ORAL | Status: DC
  Administered 2017-11-28 – 2017-11-30 (×2): 2 via ORAL
  Filled 2017-11-28 (×4): qty 2

## 2017-11-28 MED ORDER — VITAMIN C 500 MG PO TABS
500.0000 mg | ORAL_TABLET | Freq: Two times a day (BID) | ORAL | Status: DC
  Administered 2017-11-28 – 2017-11-30 (×3): 500 mg via ORAL
  Filled 2017-11-28 (×5): qty 1

## 2017-11-28 MED ORDER — SACCHAROMYCES BOULARDII 250 MG PO CAPS
250.0000 mg | ORAL_CAPSULE | Freq: Two times a day (BID) | ORAL | Status: DC
  Administered 2017-11-28 – 2017-11-30 (×4): 250 mg via ORAL
  Filled 2017-11-28 (×5): qty 1

## 2017-11-28 MED ORDER — ZINC SULFATE 220 (50 ZN) MG PO CAPS
220.0000 mg | ORAL_CAPSULE | Freq: Every day | ORAL | Status: DC
  Administered 2017-11-30: 220 mg via ORAL
  Filled 2017-11-28 (×3): qty 1

## 2017-11-28 MED ORDER — MELATONIN 3 MG PO TABS
3.0000 mg | ORAL_TABLET | Freq: Every evening | ORAL | Status: DC | PRN
  Administered 2017-11-29: 6 mg via ORAL
  Filled 2017-11-28: qty 2

## 2017-11-28 MED ORDER — BELLADONNA ALKALOIDS-OPIUM 16.2-60 MG RE SUPP: 1.0000 | Freq: Three times a day (TID) | RECTAL | Status: DC | PRN

## 2017-11-28 MED ORDER — FERROUS SULFATE 325 (65 FE) MG PO TABS
325.0000 mg | ORAL_TABLET | Freq: Two times a day (BID) | ORAL | Status: DC
  Administered 2017-11-29 – 2017-11-30 (×3): 325 mg via ORAL
  Filled 2017-11-28 (×5): qty 1

## 2017-11-28 MED ORDER — VITAMIN B-12 1000 MCG PO TABS
1000.0000 ug | ORAL_TABLET | Freq: Every day | ORAL | Status: DC
  Administered 2017-11-30: 1000 ug via ORAL
  Filled 2017-11-28 (×4): qty 1

## 2017-11-28 MED ORDER — COLLAGENASE 250 UNIT/GM EX OINT
TOPICAL_OINTMENT | Freq: Every day | CUTANEOUS | Status: DC
  Administered 2017-11-29 – 2017-11-30 (×2): via TOPICAL
  Filled 2017-11-28 (×2): qty 30

## 2017-11-28 MED ORDER — BACLOFEN 10 MG PO TABS
10.0000 mg | ORAL_TABLET | Freq: Three times a day (TID) | ORAL | Status: DC
  Filled 2017-11-28: qty 1

## 2017-11-28 MED ORDER — BISACODYL 10 MG RE SUPP
10.0000 mg | Freq: Every day | RECTAL | Status: AC
  Filled 2017-11-28: qty 1

## 2017-11-28 MED ORDER — FUROSEMIDE 20 MG PO TABS
20.0000 mg | ORAL_TABLET | Freq: Every day | ORAL | Status: DC
  Administered 2017-11-30: 20 mg via ORAL
  Filled 2017-11-28 (×3): qty 1

## 2017-11-28 MED ORDER — BISACODYL 10 MG RE SUPP: 10.0000 mg | Freq: Three times a day (TID) | RECTAL | Status: DC | PRN

## 2017-11-28 MED ORDER — PANTOPRAZOLE SODIUM 40 MG PO TBEC
40.0000 mg | DELAYED_RELEASE_TABLET | Freq: Every day | ORAL | Status: DC
  Administered 2017-11-30: 40 mg via ORAL
  Filled 2017-11-28 (×4): qty 1

## 2017-11-28 MED ORDER — ADULT MULTIVITAMIN W/MINERALS CH
1.0000 | ORAL_TABLET | Freq: Every day | ORAL | Status: DC
  Administered 2017-11-30: 1 via ORAL
  Filled 2017-11-28 (×3): qty 1

## 2017-11-28 MED ORDER — ONDANSETRON HCL 4 MG PO TABS: 4.0000 mg | ORAL_TABLET | Freq: Four times a day (QID) | ORAL | Status: DC | PRN

## 2017-11-28 MED ORDER — SODIUM CHLORIDE 0.9 % IV SOLN
Freq: Once | INTRAVENOUS | Status: AC
  Administered 2017-11-28: 07:00:00 via INTRAVENOUS

## 2017-11-28 MED ORDER — COLLAGENASE 250 UNIT/GM EX OINT: 1.0000 "application " | TOPICAL_OINTMENT | Freq: Every evening | CUTANEOUS | Status: DC

## 2017-11-28 MED ORDER — AMITRIPTYLINE HCL 50 MG PO TABS
25.0000 mg | ORAL_TABLET | Freq: Every day | ORAL | Status: DC
  Administered 2017-11-28 – 2017-11-29 (×2): 25 mg via ORAL
  Filled 2017-11-28 (×2): qty 1

## 2017-11-28 MED ORDER — ACETAMINOPHEN 325 MG PO TABS: 650.0000 mg | ORAL_TABLET | Freq: Four times a day (QID) | ORAL | Status: DC | PRN

## 2017-11-28 MED ORDER — ATORVASTATIN CALCIUM 40 MG PO TABS
40.0000 mg | ORAL_TABLET | Freq: Every day | ORAL | Status: DC
  Administered 2017-11-29 – 2017-11-30 (×2): 40 mg via ORAL
  Filled 2017-11-28 (×4): qty 1

## 2017-11-28 MED ORDER — SENNOSIDES-DOCUSATE SODIUM 8.6-50 MG PO TABS: 2.0000 | ORAL_TABLET | Freq: Two times a day (BID) | ORAL | Status: DC | PRN

## 2017-11-28 MED ORDER — POTASSIUM CHLORIDE CRYS ER 10 MEQ PO TBCR
10.0000 meq | EXTENDED_RELEASE_TABLET | Freq: Every day | ORAL | Status: DC
  Administered 2017-11-30: 10 meq via ORAL
  Filled 2017-11-28 (×3): qty 1

## 2017-11-28 MED ORDER — ACETAMINOPHEN 650 MG RE SUPP: 650.0000 mg | Freq: Four times a day (QID) | RECTAL | Status: DC | PRN

## 2017-11-28 MED ORDER — HYDRALAZINE HCL 20 MG/ML IJ SOLN: 5.0000 mg | Freq: Three times a day (TID) | INTRAMUSCULAR | Status: DC | PRN

## 2017-11-28 NOTE — ED Notes (Signed)
Notified Gerald Stabs RN @ [phone] that [facility] was notified of Code Sepsis @ [phone].

## 2017-11-28 NOTE — ED Notes (Signed)
Antibiotic called for.
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
## 2017-11-28 NOTE — ED Notes (Signed)
Phillip [name]., admitting,  at bedside.

## 2017-11-28 NOTE — ED Notes (Signed)
Pt's visitor had to leave but left her name and number:  Benjamin Stain, [phone]

## 2017-11-28 NOTE — ED Notes (Signed)
ICU Hospital Bed ordered via [facility] w/ [facility] @ 1101.
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
## 2017-11-28 NOTE — ED Notes (Signed)
Per sister, Phillip [name], HPOA, pt has reported feeling sleepier than usual for past several days with lower BP than normal.  Pt's sister states pt seen yesterday at urologist's office.

## 2017-11-28 NOTE — ED Notes (Signed)
PO meds not given. Pt not able to follow commands.

## 2017-11-28 NOTE — Progress Notes (Signed)
I was contacted by the hospitalist regarding this patient.  He was recently in the hospital for a UTI secondary to Klebsiella and Pseudomonas.  He has a known right staghorn calculus and is a quadriplegic who is bladder is managed with an SP tube.  He was readmitted to the hospital with infection and sepsis.  A CT scan was obtained which reveals a staghorn calculus on the right side without obstruction.  It has Hounsfield units of approximately 700. There was no hydronephrosis or evidence of obstruction.  His creatinine was noted to be 1.15. I indicated that without obstruction there was no need for nephrostomy tube placement at this time.  He will need to have his staghorn calculus addressed as an outpatient once his infection has been cleared.   In addition his  suprapubic tube was just changed on 11/08/17.  I felt treatment of his infection with antibiotics empirically was appropriate and if his culture returned positive for Proteus, which can form a hard to treat layer on the catheter that the suprapubic tube should be changed otherwise I did not feel a change of his SP tube was necessary at this time. Follow-up instructions were placed on the chart.

## 2017-11-28 NOTE — Progress Notes (Signed)
Wound care was done, wounds were measured and wet to dry was done on the the right sacrum and both feet.

## 2017-11-28 NOTE — ED Notes (Signed)
Patient transported to CT 

## 2017-11-28 NOTE — ED Notes (Signed)
The pt has pads on his feet and ankles  He has  A supra pubic

## 2017-11-28 NOTE — ED Triage Notes (Signed)
The pt arrived by [facility] from [facility] not acting normal since 0000 midnight  Opens boths with vigorous stimulus.  Recent admission [place]  For sepsis  paralplegia

## 2017-11-28 NOTE — ED Notes (Signed)
To ct

## 2017-11-28 NOTE — Progress Notes (Signed)
Phillip [name] is a 51 y.o. male patient admitted from ED awake, alert - oriented  X 4 - no acute distress noted.  VSS - Blood pressure (!) 152/85, pulse 100, temperature 98.5 F (36.9 C), temperature source Oral, resp. rate 12, height 5\' 9"  (1.753 m), weight 90 kg (198 lb 8 oz), SpO2 97 %.    IV in place, occlusive dsg intact without redness.  Orientation to room, and floor completed with information packet given to patient/family.  Patient declined safety video at this time.  Admission INP armband ID verified with patient/family, and in place.   SR up x 2, fall assessment complete, with patient and family able to verbalize understanding of risk associated with falls, and verbalized understanding to call nsg before up out of bed.  Call light within reach, patient able to voice, and demonstrate understanding.  Skin, clean-dry- intact without evidence of bruising, or skin tears.   No evidence of skin break down noted on exam.     Will cont to eval and treat per MD orders.  Holley Raring, RN 11/28/2017 4:21 PM

## 2017-11-28 NOTE — Consult Note (Addendum)
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 Healed pink dry scar tissue across bilat buttocks and sacrum and left and right heels. Pressure Injury POA: Yes Right IT:  4X7X.8cmX3cm; 30% yellow, 70% red, stage 3 pressure injury, mod amt tan drainage, no odor Left IT:  2X2X.1cm and 1X1X.1cm, stage 2 pressure injuries, pink and dry Right great toe; dry scabbed area which peels back slightly, revealing pink partial thickness skin loss; 1X1X.1cm Left lateral foot:  1.2X1cm x .4cm; unstageable pressure injury, 75% yellow, 25% red, mod amt tan drainage, no odor Dressing procedure/placement/frequency: Santyl ointment to provide enzymatic debridement of nonviable tissue to left foot and right ischium. Foam dressing to left ischium and right great toe.  Air mattress to reduce pressure.  Discussed plan of care with patient and family member at the bedside.  Please re-consult if further assistance is needed.  Thank-you,  Julien Girt MSN, [place], [place], [place], [place]

## 2017-11-29 DIAGNOSIS — G904 Autonomic dysreflexia: Secondary | ICD-10-CM

## 2017-11-29 DIAGNOSIS — R4182 Altered mental status, unspecified: Secondary | ICD-10-CM

## 2017-11-29 DIAGNOSIS — N179 Acute kidney failure, unspecified: Secondary | ICD-10-CM

## 2017-11-29 DIAGNOSIS — F1020 Alcohol dependence, uncomplicated: Secondary | ICD-10-CM

## 2017-11-29 DIAGNOSIS — G9341 Metabolic encephalopathy: Principal | ICD-10-CM

## 2017-11-29 DIAGNOSIS — I5032 Chronic diastolic (congestive) heart failure: Secondary | ICD-10-CM

## 2017-11-29 LAB — COMPREHENSIVE METABOLIC PANEL
ALBUMIN: 3.1 g/dL — AB (ref 3.5–5.0)
ALT: 26 U/L (ref 17–63)
ANION GAP: 9 (ref 5–15)
AST: 17 U/L (ref 15–41)
Alkaline Phosphatase: 120 U/L (ref 38–126)
BILIRUBIN TOTAL: 1 mg/dL (ref 0.3–1.2)
BUN: 10 mg/dL (ref 6–20)
CHLORIDE: 108 mmol/L (ref 101–111)
CO2: 23 mmol/L (ref 22–32)
Calcium: 8.5 mg/dL — ABNORMAL LOW (ref 8.9–10.3)
Creatinine, Ser: 0.74 mg/dL (ref 0.61–1.24)
GFR calc Af Amer: >60 mL/min (ref >60)
GFR calc non Af Amer: >60 mL/min (ref >60)
GLUCOSE: 83 mg/dL (ref 65–99)
POTASSIUM: 3.2 mmol/L — AB (ref 3.5–5.1)
SODIUM: 140 mmol/L (ref 135–145)
TOTAL PROTEIN: 6.9 g/dL (ref 6.5–8.1)

## 2017-11-29 LAB — CBC
HEMATOCRIT: 32.6 % — AB (ref 39.0–52.0)
HEMOGLOBIN: 10.2 g/dL — AB (ref 13.0–17.0)
MCH: 28.7 pg (ref 26.0–34.0)
MCHC: 31.3 g/dL (ref 30.0–36.0)
MCV: 91.8 fL (ref 78.0–100.0)
Platelets: 308 10*3/uL (ref 150–400)
RBC: 3.55 MIL/uL — ABNORMAL LOW (ref 4.22–5.81)
RDW: 15.8 % — ABNORMAL HIGH (ref 11.5–15.5)
WBC: 10.4 10*3/uL (ref 4.0–10.5)

## 2017-11-29 LAB — URINE CULTURE: Culture: 80000 — AB

## 2017-11-29 MED ORDER — POTASSIUM CHLORIDE CRYS ER 20 MEQ PO TBCR
40.0000 meq | EXTENDED_RELEASE_TABLET | Freq: Once | ORAL | Status: AC
  Administered 2017-11-29: 40 meq via ORAL
  Filled 2017-11-29: qty 2

## 2017-11-29 MED ORDER — SODIUM CHLORIDE 0.9% FLUSH: 10.0000 mL | INTRAVENOUS | Status: DC | PRN

## 2017-11-29 MED ORDER — SODIUM CHLORIDE 0.9% FLUSH
10.0000 mL | Freq: Two times a day (BID) | INTRAVENOUS | Status: DC
  Administered 2017-11-29: 10 mL

## 2017-11-29 NOTE — Progress Notes (Signed)
Patient is refusing to take his medicine at this time. He is requesting me to come back later.
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
## 2017-11-29 NOTE — Progress Notes (Signed)
PT Cancellation Note  Patient Details Name: Phillip [name] MRN: [id number] DOB: 05/18/1966   Cancelled Treatment:    Reason Eval/Treat Not Completed: PT screened, no needs identified, will sign off;Other (comment)(ck once more Monday and sign off then). Pt has had wounds 6 months, has new air mattress in SNF.  Has hoyer transfers at baseline and seating issues but is not in possession of his power chair.  Will recheck Monday to see if any new needs have been identified, but if not will dc order.   Ramond Dial 11/29/2017, 9:56 AM   Mee Hives, PT MS Acute Rehab Dept. Number: [place] and [place]

## 2017-11-29 NOTE — Progress Notes (Signed)
PROGRESS NOTE    Phillip [name]  KYH:[id number] DOB: 02-Oct-1966 DOA: 11/28/2017 PCP: Garwin Brothers, MD      Brief Narrative:  51 yo M with quadruplegia from [facility], known staghorn calculus, indwelling SP cath, recent MDR Pseudomonas UTI, chronic decubitus ulcer and HFpEF.     Assessment & Plan:  Active Problems:   Alcoholism /alcohol abuse ([place])   Tobacco abuse   Spinal cord injury at C5-C7 level without injury of spinal bone (HCC)   Quadriplegia (HCC)   Seizures (HCC)   Normocytic anemia   Autonomic dysreflexia   HLD (hyperlipidemia)   GERD (gastroesophageal reflux disease)   Depression   Acute metabolic encephalopathy   AKI (acute kidney injury) (HCC)   Chronic diastolic CHF (congestive heart failure) (HCC)   Pressure injury of skin   Pseudomonas infection   Bacteremia due to Klebsiella pneumoniae   Confusion   UTI Confusion -Do not change urine catheter, unless cultures grow Proteus.   -Ceftolozane-tazobactam IV  -Follow cultures    History of quadriplegia, due to C-spine cord injury -Continue baclofen  Hypertension -Continue BB, clonidine -Continue prn hydralazine  -Continue statin  Other medications -Continue PPI -Continue Elavil -Continue Seroquel   Chronic diastolic heart failure Euvolemic. -Continue Lasix -Daily weights, I/Os  History of chronic ulcers -Consult WOC            DVT prophylaxis: Lovenox Code Status: FULL Family Communication: None Disposition Plan: IV antibiotics until culture data avaialble, then transition to other agent, treat at [facility] and Urology follow up.   Consultants:   Urology  ID  Procedures:   None  Antimicrobials:   Ceftolozane-tazo    Subjective: Feeling okay.  Oriented to hospital, situation. Abdominal pain mild present. No new chest pain, fever, confusion, weaknses.    Objective: Vitals:   11/28/17 1530 11/28/17 1603 11/28/17 2139 11/29/17 0516  BP:  (!) 152/85 (!) 179/115 111/67   Pulse: 94 100 88 (!) 110  Resp: 12 12 18 18   Temp:  98.5 F (36.9 C) 98.8 F (37.1 C) 99.2 F (37.3 C)  TempSrc:  Oral Oral Oral  SpO2: 98% 97% 97% 99%  Weight:  90 kg (198 lb 8 oz)  92 kg (202 lb 13.2 oz)  Height:  5\' 9"  (1.753 m)      Intake/Output Summary (Last 24 hours) at 11/29/2017 1118 Last data filed at 11/29/2017 1000 Gross per 24 hour  Intake 1814.73 ml  Output 1650 ml  Net 164.73 ml   Filed Weights   11/28/17 0606 11/28/17 1603 11/29/17 0516  Weight: 95.7 kg (211 lb) 90 kg (198 lb 8 oz) 92 kg (202 lb 13.2 oz)    Examination: General appearance: Quadruplegic adult male, alert and in no acute distress.   HEENT: Anicteric, conjunctiva pink, lids and lashes normal. No nasal deformity, discharge, epistaxis.  Lips dry.   Skin: Warm and dry.  No suspicious rashes or lesions. Cardiac: RRR, nl S1-S2, no murmurs appreciated.  Capillary refill is brisk.  JVP not visible.  No LE edema.  Radial pulses 2+ and symmetric. Respiratory: Normal respiratory rate and rhythm.  CTAB without rales or wheezes. Abdomen: Abdomen soft.  Mild TTP. No ascites, distension, hepatosplenomegaly.   MSK: Chronic quadruplegic contractures. Neuro: Awake and alert.  EOMI, moves all extremities. Speech fluent.    Psych: Sensorium intact and responding to questions, attention normal. Affect normal.  Judgment and insight appear normal.    Data Reviewed: I have personally reviewed following labs and imaging studies:  CBC: Recent Labs  Lab 11/28/17 0627 11/29/17 0558  WBC 11.3* 10.4  NEUTROABS 5.4  --   HGB 11.2* 10.2*  HCT 35.6* 32.6*  MCV 91.0 91.8  PLT 319 161   Basic Metabolic Panel: Recent Labs  Lab 11/28/17 0627 11/29/17 0558  NA 141 140  K 3.6 3.2*  CL 105 108  CO2 27 23  GLUCOSE 91 83  BUN 14 10  CREATININE 1.15 0.74  CALCIUM 9.1 8.5*   GFR: Estimated Creatinine Clearance: 125.1 mL/min (by C-G formula based on SCr of 0.74 mg/dL). Liver Function Tests: Recent Labs  Lab  11/28/17 0627 11/29/17 0558  AST 19 17  ALT 33 26  ALKPHOS 127* 120  BILITOT 0.7 1.0  PROT 7.7 6.9  ALBUMIN 3.5 3.1*   No results for input(s): LIPASE, AMYLASE in the last 168 hours. Recent Labs  Lab 11/28/17 1112  AMMONIA 34   Coagulation Profile: No results for input(s): INR, PROTIME in the last 168 hours. Cardiac Enzymes: No results for input(s): CKTOTAL, CKMB, CKMBINDEX, TROPONINI in the last 168 hours. BNP (last 3 results) No results for input(s): PROBNP in the last 8760 hours. HbA1C: No results for input(s): HGBA1C in the last 72 hours. CBG: Recent Labs  Lab 11/28/17 0605  GLUCAP 95   Lipid Profile: No results for input(s): CHOL, HDL, LDLCALC, TRIG, CHOLHDL, LDLDIRECT in the last 72 hours. Thyroid Function Tests: No results for input(s): TSH, T4TOTAL, FREET4, T3FREE, THYROIDAB in the last 72 hours. Anemia Panel: No results for input(s): VITAMINB12, FOLATE, FERRITIN, TIBC, IRON, RETICCTPCT in the last 72 hours. Urine analysis:    Component Value Date/Time   COLORURINE YELLOW 11/28/2017 0644   APPEARANCEUR CLOUDY (A) 11/28/2017 0644   LABSPEC 1.011 11/28/2017 0644   PHURINE 6.0 11/28/2017 0644   GLUCOSEU NEGATIVE 11/28/2017 0644   HGBUR MODERATE (A) 11/28/2017 0644   BILIRUBINUR NEGATIVE 11/28/2017 0644   KETONESUR NEGATIVE 11/28/2017 0644   PROTEINUR 30 (A) 11/28/2017 0644   UROBILINOGEN 0.2 08/23/2015 0247   NITRITE NEGATIVE 11/28/2017 0644   LEUKOCYTESUR LARGE (A) 11/28/2017 0644   Sepsis Labs: @LABRCNTIP (procalcitonin:4,lacticidven:4)  )No results found for this or any previous visit (from the past 240 hour(s)).       Radiology Studies: Ct Head Wo Contrast  Result Date: 11/28/2017 CLINICAL DATA:  Unexplained altered level of consciousness. EXAM: CT HEAD WITHOUT CONTRAST TECHNIQUE: Contiguous axial images were obtained from the base of the skull through the vertex without intravenous contrast. COMPARISON:  10/17/2017 CT.  06/01/2016 MRI.  FINDINGS: Brain: No evidence of malformation, atrophy, old or acute small or large vessel infarction, mass lesion, hemorrhage, hydrocephalus or extra-axial collection. No evidence of pituitary lesion. Vascular: There is atherosclerotic calcification of the major vessels at the base of the brain. Skull: Normal.  No fracture or focal bone lesion. Sinuses/Orbits: Visualized sinuses are clear. No fluid in the middle ears or mastoids. Visualized orbits are normal. Other: None significant IMPRESSION: Normal appearance of the brain itself. Atherosclerotic calcification of the major vessels at the base of the brain, advanced for age. Electronically Signed   By: Nelson Chimes M.D.   On: 11/28/2017 07:28   Dg Chest Port 1 View  Result Date: 11/28/2017 CLINICAL DATA:  Sepsis.  Shortness of breath. EXAM: PORTABLE CHEST 1 VIEW COMPARISON:  Radiograph 11/10/2017. Lung bases from abdominal CT [id number] FINDINGS: Right upper extremity PICC tip in the mid SVC. Upper normal heart size. Development of vascular congestion. Streaky right midlung atelectasis. Small left pleural effusion. No  confluent airspace disease. No pneumothorax. Postsurgical change in the cervical spine. IMPRESSION: 1. Vascular congestion, new from prior exam. 2. Small left pleural effusion.  Right midlung atelectasis. Electronically Signed   By: Jeb Levering M.D.   On: 11/28/2017 06:38   Ct Renal Stone Study  Result Date: 11/28/2017 CLINICAL DATA:  Microscopic hematuria. EXAM: CT ABDOMEN AND PELVIS WITHOUT CONTRAST TECHNIQUE: Multidetector CT imaging of the abdomen and pelvis was performed following the standard protocol without IV contrast. COMPARISON:  CT scan of November 11, 2017. FINDINGS: Lower chest: No acute abnormality. Hepatobiliary: No focal liver abnormality is seen. No gallstones, gallbladder wall thickening, or biliary dilatation. Pancreas: Unremarkable. No pancreatic ductal dilatation or surrounding inflammatory changes. Spleen: Normal in  size without focal abnormality. Adrenals/Urinary Tract: Adrenal glands appear normal. Bilateral nephrolithiasis is noted, worse on the right. Right-sided ureteral stent is noted in grossly good position. No hydronephrosis is noted. Urinary bladder is decompressed secondary to suprapubic catheter. Stomach/Bowel: The stomach appears normal. The appendix is unremarkable. There is no evidence of bowel obstruction or inflammation. Large amount of stool seen throughout the colon and rectum. Vascular/Lymphatic: Aortic atherosclerosis. No enlarged abdominal or pelvic lymph nodes. Reproductive: Prostate is unremarkable. Other: No abdominal wall hernia or abnormality. No abdominopelvic ascites. Musculoskeletal: No acute or significant osseous findings. IMPRESSION: Bilateral nephrolithiasis. Right ureteral stent is noted in grossly good position. No hydronephrosis is noted. Large amount of stool seen throughout the colon and rectum. Aortic atherosclerosis. No other abnormality seen in the abdomen or pelvis. Electronically Signed   By: Marijo Conception, M.D.   On: 11/28/2017 09:04        Scheduled Meds: . amitriptyline  25 mg Oral QHS  . atorvastatin  40 mg Oral Daily  . baclofen  10 mg Oral TID  . bisacodyl  10 mg Rectal Daily  . cloNIDine  0.1 mg Oral TID  . collagenase   Topical Daily  . darifenacin  7.5 mg Oral Daily  . enoxaparin (LOVENOX) injection  40 mg Subcutaneous Daily  . ferrous sulfate  325 mg Oral BID WC  . furosemide  20 mg Oral Daily  . gabapentin  300 mg Oral TID  . metoprolol tartrate  12.5 mg Oral BID  . multivitamin with minerals  1 tablet Oral Daily  . pantoprazole  40 mg Oral Daily  . potassium chloride  10 mEq Oral Daily  . potassium chloride  40 mEq Oral Once  . saccharomyces boulardii  250 mg Oral BID  . senna-docusate  2 tablet Oral BID  . sodium chloride flush  10-40 mL Intracatheter Q12H  . cyanocobalamin  1,000 mcg Oral Daily  . ascorbic acid  500 mg Oral BID  . zinc  sulfate  220 mg Oral Daily   Continuous Infusions: . sodium chloride 50 mL/hr at 11/28/17 1855  . ceftolozane-tazobactam (ZERBAXA) IVPB Stopped (11/29/17 1025)     LOS: 1 day    Time spent: 30 minutes    Edwin Dada, MD [facility] Pager [phone]  If 7PM-7AM, please contact night-coverage [email or web address] Password [email or web address] 11/29/2017, 11:18 AM

## 2017-11-30 DIAGNOSIS — L89309 Pressure ulcer of unspecified buttock, unspecified stage: Secondary | ICD-10-CM

## 2017-11-30 DIAGNOSIS — G8250 Quadriplegia, unspecified: Secondary | ICD-10-CM

## 2017-11-30 DIAGNOSIS — B3749 Other urogenital candidiasis: Secondary | ICD-10-CM

## 2017-11-30 DIAGNOSIS — R569 Unspecified convulsions: Secondary | ICD-10-CM

## 2017-11-30 LAB — BASIC METABOLIC PANEL
Anion gap: 7 (ref 5–15)
BUN: 7 mg/dL (ref 6–20)
CHLORIDE: 108 mmol/L (ref 101–111)
CO2: 26 mmol/L (ref 22–32)
CREATININE: 0.73 mg/dL (ref 0.61–1.24)
Calcium: 8.5 mg/dL — ABNORMAL LOW (ref 8.9–10.3)
Glucose, Bld: 92 mg/dL (ref 65–99)
POTASSIUM: 3.3 mmol/L — AB (ref 3.5–5.1)
SODIUM: 141 mmol/L (ref 135–145)

## 2017-11-30 LAB — CBC
HEMATOCRIT: 30 % — AB (ref 39.0–52.0)
Hemoglobin: 9.3 g/dL — ABNORMAL LOW (ref 13.0–17.0)
MCH: 28.1 pg (ref 26.0–34.0)
MCHC: 31 g/dL (ref 30.0–36.0)
MCV: 90.6 fL (ref 78.0–100.0)
PLATELETS: 290 10*3/uL (ref 150–400)
RBC: 3.31 MIL/uL — AB (ref 4.22–5.81)
RDW: 15.2 % (ref 11.5–15.5)
WBC: 7.5 10*3/uL (ref 4.0–10.5)

## 2017-11-30 MED ORDER — HEPARIN SOD (PORK) LOCK FLUSH 100 UNIT/ML IV SOLN
250.0000 [IU] | INTRAVENOUS | Status: AC | PRN
  Administered 2017-11-30: 250 [IU]

## 2017-11-30 MED ORDER — FLUCONAZOLE 200 MG PO TABS: 200.0000 mg | ORAL_TABLET | Freq: Every day | ORAL | 0 refills | Status: DC

## 2017-11-30 MED ORDER — FLUCONAZOLE 100 MG PO TABS
200.0000 mg | ORAL_TABLET | Freq: Every day | ORAL | Status: DC
  Administered 2017-11-30: 200 mg via ORAL
  Filled 2017-11-30: qty 2

## 2017-11-30 NOTE — Discharge Summary (Signed)
Physician Discharge Summary  Phillip [name] BPZ:[id number] DOB: 06/18/1966 DOA: 11/28/2017  PCP: Garwin Brothers, MD  Admit date: 11/28/2017 Discharge date: 11/30/2017  Admitted From: [facility]   Disposition:  [facility]   Recommendations for Outpatient Follow-up:  1. Follow up with Urology in 2 weeks 2. Follow up with Infectious Disease as previously scheduled   Home Health: None  Equipment/Devices: None  Discharge Condition: Stable  CODE STATUS: FULL   Brief/Interim Summary: This is a 51 yo M with quadruplegia, indwelling SP catheter, known nephrolithiasis and recent MDR pseudomonas infection who presented with confusion.   Altered mental status Unclear etiology.  Possible causes include fatigue, medications, dehydration or UTI.  Resolved by morning after admission.  Recent MDR Pseudomonas UTI Patient was restarted on Xerbaxa.  Catheter maintained, not replaced.  Urine culture did not grow Proteus.  In fact, urine culture grew no bacteria at all, just yeast.   Yeast Cystitis Started on Fluconazole.  Discussed with ID and Urology.  Fluconazole PO x14 days.  Urology will arrange follow up.       Discharge Diagnoses:  Active Problems:   Alcoholism /alcohol abuse ([place])   Tobacco abuse   Spinal cord injury at C5-C7 level without injury of spinal bone (HCC)   Quadriplegia (HCC)   Seizures (HCC)   Normocytic anemia   Autonomic dysreflexia   HLD (hyperlipidemia)   GERD (gastroesophageal reflux disease)   Depression   Acute metabolic encephalopathy   AKI (acute kidney injury) ([place])   Chronic diastolic CHF (congestive heart failure) (HCC)   Pressure injury of skin   Pseudomonas infection   Bacteremia due to Klebsiella pneumoniae   Confusion    Discharge Instructions  Discharge Instructions    Diet general   Complete by:  As directed    Discharge instructions   Complete by:  As directed    You were admitted for confusion.     This may have been related to dehydration or medications you take.  It may also have been related to a yeast infection of your bladder.  For this infection, take fluconazole 200 mg once daily for 2 weeks until Jan 4.  Keep Infectious Disease follow up appointment as scheduled.  Follow up with Dr. Alinda Money of [facility] in 2 weeks for a repeat urine culture.   Increase activity slowly   Complete by:  As directed      Allergies as of 11/30/2017   No Known Allergies     Medication List    TAKE these medications   acetaminophen 325 MG tablet Commonly known as:  TYLENOL Take 650 mg by mouth 3 (three) times daily.   amitriptyline 25 MG tablet Commonly known as:  ELAVIL Take 25 mg by mouth at bedtime.   ascorbic acid 500 MG tablet Commonly known as:  VITAMIN C Take 1 tablet (500 mg total) by mouth 2 (two) times daily.   atorvastatin 40 MG tablet Commonly known as:  LIPITOR Take 40 mg by mouth daily.   baclofen 10 MG tablet Commonly known as:  LIORESAL Take 1 tablet (10 mg total) by mouth 3 (three) times daily. What changed:  how much to take   bisacodyl 10 MG suppository Commonly known as:  DULCOLAX Place 10 mg rectally every 8 (eight) hours as needed for moderate constipation.   cloNIDine 0.1 MG tablet Commonly known as:  CATAPRES Take 0.1 mg by mouth 3 (three) times daily.   collagenase ointment Commonly known as:  SANTYL  Apply 1 application every evening topically. Apply to left lateral foot   cyanocobalamin 1000 MCG tablet Take 1 tablet (1,000 mcg total) by mouth daily.   ferrous sulfate 325 (65 FE) MG tablet Take 1 tablet (325 mg total) by mouth 2 (two) times daily with a meal.   fluconazole 200 MG tablet Commonly known as:  DIFLUCAN Take 1 tablet (200 mg total) by mouth daily.   furosemide 20 MG tablet Commonly known as:  LASIX Take 1 tablet (20 mg total) by mouth daily.   gabapentin 300 MG capsule Commonly known as:  NEURONTIN Take 300 mg by  mouth 3 (three) times daily.   Melatonin 3 MG Tabs Take 3-6 mg by mouth at bedtime as needed (for insomnia).   metoprolol tartrate 25 MG tablet Commonly known as:  LOPRESSOR Take 0.5 tablets (12.5 mg total) by mouth 2 (two) times daily.   multivitamin with minerals Tabs tablet Take 1 tablet by mouth daily.   ondansetron 4 MG tablet Commonly known as:  ZOFRAN Take 1 tablet (4 mg total) by mouth every 6 (six) hours as needed for nausea.   opium-belladonna 16.2-60 MG suppository Commonly known as:  B&O SUPPRETTES Place 1 suppository rectally every 8 (eight) hours as needed for bladder spasms.   pantoprazole 40 MG tablet Commonly known as:  PROTONIX Take 1 tablet (40 mg total) by mouth daily.   potassium chloride 10 MEQ tablet Commonly known as:  K-DUR,KLOR-CON Take 10 mEq daily by mouth.   saccharomyces boulardii 250 MG capsule Commonly known as:  FLORASTOR Take 1 capsule (250 mg total) by mouth 2 (two) times daily.   senna-docusate 8.6-50 MG tablet Commonly known as:  Senokot-S Take 2 tablets by mouth 2 (two) times daily as needed for mild constipation.   solifenacin 5 MG tablet Commonly known as:  VESICARE Take 5 mg by mouth daily.   zinc sulfate 220 (50 Zn) MG capsule Take 1 capsule (220 mg total) by mouth daily.      Follow-up Information    Call Raynelle Bring, MD.   Specialty:  Urology Why:  For an appointment in 1-2 weeks when you get home. Contact information: [address] [place] [address] [phone]          No Known Allergies  Consultations:  None   Procedures/Studies: Ct Abdomen Pelvis Wo Contrast  Addendum Date: 11/12/2017   ADDENDUM REPORT: 11/12/2017 05:14 ADDENDUM: These results were called by telephone at the time of interpretation on 11/11/2017 at 10:19 p.m. to Dr. Corinna Lines, who verbally acknowledged these results. Electronically Signed   By: Fidela Salisbury M.D.   On: 11/12/2017 05:14   Result Date: 11/12/2017 CLINICAL  DATA:  Recurrent urinary tract infections. Increase creatinine. Right hydronephrosis found on recent abdominal ultrasound. EXAM: CT ABDOMEN AND PELVIS WITHOUT CONTRAST TECHNIQUE: Multidetector CT imaging of the abdomen and pelvis was performed following the standard protocol without IV contrast. COMPARISON:  Ultrasound 11/09/2017 FINDINGS: Lower chest: Hyperventilatory changes of bilateral lung bases. Small bilateral pleural effusions. Small pericardial effusion, maximum thickness of 12 mm posteriorly. Calcific atherosclerotic disease of the coronary arteries. Hepatobiliary: No focal liver abnormality is seen. No gallstones, gallbladder wall thickening, or biliary dilatation. Pancreas: Unremarkable. No pancreatic ductal dilatation or surrounding inflammatory changes. Spleen: Normal in size without focal abnormality. Adrenals/Urinary Tract: Normal adrenal glands. 1.5 cm obstructive calculus in the proximal right ureter, causing moderate right hydronephrosis. Other 2 large calculi are seen within the right renal pelvis, the larger measuring 15 mm. There  also other nonobstructive renal calculi within the right renal parenchyma, with a cluster of calculi within the lower pole of the right kidney measuring 10 mm. There is a mild right perinephric renal stranding. Additionally, in the distal right ureter there is an 8 mm calculus, approximately 1 cm from the vesicoureteral junction. No significant upstream ureteral dilation is seen, suggesting that this is a nonobstructive calculus. There are small nonobstructive calculi in the left kidney, the largest measuring 5 mm. No evidence of left hydroureter. There is a suprapubic catheter. The urinary bladder is collapsed around it and therefore suboptimally evaluated, with symmetric mucosal thickening. Stomach/Bowel: Stomach is within normal limits. Appendix appears normal. No evidence of bowel wall thickening, distention, or inflammatory changes. Scattered colonic  diverticulosis. Vascular/Lymphatic: Aortic atherosclerosis. No enlarged abdominal or pelvic lymph nodes. Reproductive: Prostate is unremarkable. Other: No abdominal wall hernia or abnormality. No abdominopelvic ascites. Musculoskeletal: Post right femoral pinning. Posttraumatic calcifications about the left hip. Small scattered lytic lesions in the left ilium. IMPRESSION: 1. 1.5 cm obstructive calculus in the proximal right ureter causing moderate right hydronephrosis. Additional large right renal calculi within the right renal pelvis and parenchyma. 2. 8 mm right distal ureteral calculus, without associated upstream ureteral dilation, suggesting that this is a nonobstructive calculus. 3. Left nephrolithiasis without evidence of hydronephrosis or hydroureter. 4. Diffuse thickening of the urinary bladder wall, decompressed around suprapubic catheter and therefore poorly evaluated. 5. Subcentimeter lytic lesions in the left ilium, nonspecific. Please correlate to clinical history regarding malignancy capable of producing lytic lesions. 6. Small pericardial effusion. 7. Small bilateral pleural effusions. Bilateral lower lobe atelectasis. Infectious airspace consolidation is considered less likely. 8. Calcific atherosclerotic disease of the coronary arteries and aorta. Electronically Signed: By: Fidela Salisbury M.D. On: 11/11/2017 21:48   Dg Chest 2 View  Result Date: 11/08/2017 CLINICAL DATA:  Fever. EXAM: CHEST  2 VIEW COMPARISON:  10/18/2017 FINDINGS: The heart size and mediastinal contours are within normal limits. Both lungs are clear. The visualized skeletal structures are unremarkable. IMPRESSION: No active disease. Electronically Signed   By: Earle Gell M.D.   On: 11/08/2017 23:38   Ct Head Wo Contrast  Result Date: 11/28/2017 CLINICAL DATA:  Unexplained altered level of consciousness. EXAM: CT HEAD WITHOUT CONTRAST TECHNIQUE: Contiguous axial images were obtained from the base of the skull  through the vertex without intravenous contrast. COMPARISON:  10/17/2017 CT.  06/01/2016 MRI. FINDINGS: Brain: No evidence of malformation, atrophy, old or acute small or large vessel infarction, mass lesion, hemorrhage, hydrocephalus or extra-axial collection. No evidence of pituitary lesion. Vascular: There is atherosclerotic calcification of the major vessels at the base of the brain. Skull: Normal.  No fracture or focal bone lesion. Sinuses/Orbits: Visualized sinuses are clear. No fluid in the middle ears or mastoids. Visualized orbits are normal. Other: None significant IMPRESSION: Normal appearance of the brain itself. Atherosclerotic calcification of the major vessels at the base of the brain, advanced for age. Electronically Signed   By: Nelson Chimes M.D.   On: 11/28/2017 07:28   US Renal  Result Date: 11/09/2017 CLINICAL DATA:  Hydronephrosis. EXAM: RENAL / URINARY TRACT ULTRASOUND COMPLETE COMPARISON:  10/24/2017. FINDINGS: Right Kidney: Length: 13.5 cm. Mild to moderate right hydronephrosis again noted, similar to prior. Echogenic shadowing foci in the upper pole and central kidney suggest stones. Left Kidney: Length: 12.8 cm. Echogenicity within normal limits. No mass or hydronephrosis visualized. Bladder: Decompressed by a Foley catheter. IMPRESSION: Similar appearance mild to moderate right hydronephrosis with  potential right renal stones. Electronically Signed   By: [name] Stanley M.D.   On: 11/09/2017 13:49   Dg Chest Port 1 View  Result Date: 11/28/2017 CLINICAL DATA:  Sepsis.  Shortness of breath. EXAM: PORTABLE CHEST 1 VIEW COMPARISON:  Radiograph 11/10/2017. Lung bases from abdominal CT [id number] FINDINGS: Right upper extremity PICC tip in the mid SVC. Upper normal heart size. Development of vascular congestion. Streaky right midlung atelectasis. Small left pleural effusion. No confluent airspace disease. No pneumothorax. Postsurgical change in the cervical spine. IMPRESSION: 1. Vascular  congestion, new from prior exam. 2. Small left pleural effusion.  Right midlung atelectasis. Electronically Signed   By: Jeb Levering M.D.   On: 11/28/2017 06:38   Dg Chest Port 1 View  Result Date: 11/10/2017 CLINICAL DATA:  Respiratory distress EXAM: PORTABLE CHEST 1 VIEW COMPARISON:  11/09/2017 FINDINGS: Support devices are unchanged. Low volumes with bibasilar atelectasis, improving since prior study. Heart is borderline in size. IMPRESSION: Improving bibasilar atelectasis. Electronically Signed   By: Rolm Baptise M.D.   On: 11/10/2017 07:05   Dg Chest Portable 1 View  Result Date: 11/09/2017 CLINICAL DATA:  Intubation and OG tube placement. EXAM: PORTABLE CHEST 1 VIEW COMPARISON:  Frontal and lateral views yesterday. FINDINGS: Endotracheal tube 4.6 cm from the carina. Tip and side port of the enteric tube below the diaphragm in the stomach. The cardiomediastinal contours are normal. Faint bibasilar atelectasis. Pulmonary vasculature is normal. No consolidation, pleural effusion, or pneumothorax. No acute osseous abnormalities are seen. IMPRESSION: 1. Endotracheal tube 4.6 cm from the carina. Enteric tube in place with tip and side-port in the stomach. 2. Mild bibasilar atelectasis. Electronically Signed   By: Jeb Levering M.D.   On: 11/09/2017 01:04   Dg Abd Portable 1v  Result Date: 11/09/2017 CLINICAL DATA:  OG tube placement EXAM: PORTABLE ABDOMEN - 1 VIEW COMPARISON:  11/09/2017 FINDINGS: Repeat film shows the tip of the OG tube in the gastric antrum. Gaseous bowel distention noted mid abdomen. IMPRESSION: OG tube tip is in the distal stomach. Electronically Signed   By: [name] Stanley M.D.   On: 11/09/2017 12:25   Dg Abd Portable 1 View  Result Date: 11/09/2017 CLINICAL DATA:  NG tube placement. EXAM: PORTABLE ABDOMEN - 1 VIEW COMPARISON:  Radiograph 10 minutes prior FINDINGS: Tip of the enteric tube in the stomach, the side port is now in the region of the distal esophagus. Normal  bowel gas pattern with moderate colonic stool. IMPRESSION: Enteric tube has been retracted since chest exam 10 minutes prior, side-port now in the region of the distal esophagus. Recommend advancement of 8 cm for optimal placement. Electronically Signed   By: Jeb Levering M.D.   On: 11/09/2017 01:06   Dg C-arm 1-60 Min-no Report  Result Date: 11/12/2017 Fluoroscopy was utilized by the requesting physician.  No radiographic interpretation.   Ct Renal Stone Study  Result Date: 11/28/2017 CLINICAL DATA:  Microscopic hematuria. EXAM: CT ABDOMEN AND PELVIS WITHOUT CONTRAST TECHNIQUE: Multidetector CT imaging of the abdomen and pelvis was performed following the standard protocol without IV contrast. COMPARISON:  CT scan of November 11, 2017. FINDINGS: Lower chest: No acute abnormality. Hepatobiliary: No focal liver abnormality is seen. No gallstones, gallbladder wall thickening, or biliary dilatation. Pancreas: Unremarkable. No pancreatic ductal dilatation or surrounding inflammatory changes. Spleen: Normal in size without focal abnormality. Adrenals/Urinary Tract: Adrenal glands appear normal. Bilateral nephrolithiasis is noted, worse on the right. Right-sided ureteral stent is noted in grossly good position. No  hydronephrosis is noted. Urinary bladder is decompressed secondary to suprapubic catheter. Stomach/Bowel: The stomach appears normal. The appendix is unremarkable. There is no evidence of bowel obstruction or inflammation. Large amount of stool seen throughout the colon and rectum. Vascular/Lymphatic: Aortic atherosclerosis. No enlarged abdominal or pelvic lymph nodes. Reproductive: Prostate is unremarkable. Other: No abdominal wall hernia or abnormality. No abdominopelvic ascites. Musculoskeletal: No acute or significant osseous findings. IMPRESSION: Bilateral nephrolithiasis. Right ureteral stent is noted in grossly good position. No hydronephrosis is noted. Large amount of stool seen throughout  the colon and rectum. Aortic atherosclerosis. No other abnormality seen in the abdomen or pelvis. Electronically Signed   By: Marijo Conception, M.D.   On: 11/28/2017 09:04       Subjective: Feels normal.  No new feer, abdominal pain, pain at foley, confusion, weakness.    Discharge Exam: Vitals:   11/30/17 0858 11/30/17 1529  BP: (!) 163/98 123/75  Pulse: 99 98  Resp:  20  Temp:  98.6 F (37 C)  SpO2:  100%   Vitals:   11/30/17 0207 11/30/17 0618 11/30/17 0858 11/30/17 1529  BP: (!) 86/68 (!) 136/91 (!) 163/98 123/75  Pulse:  92 99 98  Resp:  17  20  Temp:  98.1 F (36.7 C)  98.6 F (37 C)  TempSrc:  Oral  Oral  SpO2:  100%  100%  Weight:  93.9 kg (207 lb 1.6 oz)    Height:        General: Pt is alert, awake, not in acute distress Cardiovascular: RRR, S1/S2 +, no rubs, no gallops Respiratory: CTA bilaterally, no wheezing, no rhonchi Abdominal: Soft, NT, ND, bowel sounds + Extremities: no edema, no cyanosis, chronic contractures Skin: Buttock ulcers inspected and without purulent drainage, appear healthy, no surrounding cellulitis.     The results of significant diagnostics from this hospitalization (including imaging, microbiology, ancillary and laboratory) are listed below for reference.     Microbiology: Recent Results (from the past 240 hour(s))  Urine culture     Status: Abnormal   Collection Time: 11/28/17  6:24 AM  Result Value Ref Range Status   Specimen Description URINE, CATHETERIZED  Final   Special Requests NONE  Final   Culture 80,000 COLONIES/mL YEAST (A)  Final   Report Status 11/29/2017 FINAL  Final  Blood Culture (routine x 2)     Status: None (Preliminary result)   Collection Time: 11/28/17  6:40 AM  Result Value Ref Range Status   Specimen Description BLOOD LEFT HAND  Final   Special Requests   Final    BOTTLES DRAWN AEROBIC AND ANAEROBIC Blood Culture adequate volume   Culture NO GROWTH 2 DAYS  Final   Report Status PENDING  Incomplete   Blood Culture (routine x 2)     Status: None (Preliminary result)   Collection Time: 11/28/17  6:45 AM  Result Value Ref Range Status   Specimen Description BLOOD LEFT HAND  Final   Special Requests IN PEDIATRIC BOTTLE Blood Culture adequate volume  Final   Culture NO GROWTH 2 DAYS  Final   Report Status PENDING  Incomplete     Labs: BNP (last 3 results) Recent Labs    09/13/17 0408  BNP 92.1   Basic Metabolic Panel: Recent Labs  Lab 11/28/17 0627 11/29/17 0558 11/30/17 0444  NA 141 140 141  K 3.6 3.2* 3.3*  CL 105 108 108  CO2 27 23 26   GLUCOSE 91 83 92  BUN 14 10  7  CREATININE 1.15 0.74 0.73  CALCIUM 9.1 8.5* 8.5*   Liver Function Tests: Recent Labs  Lab 11/28/17 0627 11/29/17 0558  AST 19 17  ALT 33 26  ALKPHOS 127* 120  BILITOT 0.7 1.0  PROT 7.7 6.9  ALBUMIN 3.5 3.1*   No results for input(s): LIPASE, AMYLASE in the last 168 hours. Recent Labs  Lab 11/28/17 1112  AMMONIA 34   CBC: Recent Labs  Lab 11/28/17 0627 11/29/17 0558 11/30/17 0444  WBC 11.3* 10.4 7.5  NEUTROABS 5.4  --   --   HGB 11.2* 10.2* 9.3*  HCT 35.6* 32.6* 30.0*  MCV 91.0 91.8 90.6  PLT 319 308 290   Cardiac Enzymes: No results for input(s): CKTOTAL, CKMB, CKMBINDEX, TROPONINI in the last 168 hours. BNP: Invalid input(s): POCBNP CBG: Recent Labs  Lab 11/28/17 0605  GLUCAP 95   D-Dimer No results for input(s): DDIMER in the last 72 hours. Hgb A1c No results for input(s): HGBA1C in the last 72 hours. Lipid Profile No results for input(s): CHOL, HDL, LDLCALC, TRIG, CHOLHDL, LDLDIRECT in the last 72 hours. Thyroid function studies No results for input(s): TSH, T4TOTAL, T3FREE, THYROIDAB in the last 72 hours.  Invalid input(s): FREET3 Anemia work up No results for input(s): VITAMINB12, FOLATE, FERRITIN, TIBC, IRON, RETICCTPCT in the last 72 hours. Urinalysis    Component Value Date/Time   COLORURINE YELLOW 11/28/2017 0644   APPEARANCEUR CLOUDY (A) 11/28/2017 0644    LABSPEC 1.011 11/28/2017 0644   PHURINE 6.0 11/28/2017 0644   GLUCOSEU NEGATIVE 11/28/2017 0644   HGBUR MODERATE (A) 11/28/2017 0644   BILIRUBINUR NEGATIVE 11/28/2017 0644   KETONESUR NEGATIVE 11/28/2017 0644   PROTEINUR 30 (A) 11/28/2017 0644   UROBILINOGEN 0.2 08/23/2015 0247   NITRITE NEGATIVE 11/28/2017 0644   LEUKOCYTESUR LARGE (A) 11/28/2017 0644   Sepsis Labs Invalid input(s): PROCALCITONIN,  WBC,  LACTICIDVEN Microbiology Recent Results (from the past 240 hour(s))  Urine culture     Status: Abnormal   Collection Time: 11/28/17  6:24 AM  Result Value Ref Range Status   Specimen Description URINE, CATHETERIZED  Final   Special Requests NONE  Final   Culture 80,000 COLONIES/mL YEAST (A)  Final   Report Status 11/29/2017 FINAL  Final  Blood Culture (routine x 2)     Status: None (Preliminary result)   Collection Time: 11/28/17  6:40 AM  Result Value Ref Range Status   Specimen Description BLOOD LEFT HAND  Final   Special Requests   Final    BOTTLES DRAWN AEROBIC AND ANAEROBIC Blood Culture adequate volume   Culture NO GROWTH 2 DAYS  Final   Report Status PENDING  Incomplete  Blood Culture (routine x 2)     Status: None (Preliminary result)   Collection Time: 11/28/17  6:45 AM  Result Value Ref Range Status   Specimen Description BLOOD LEFT HAND  Final   Special Requests IN PEDIATRIC BOTTLE Blood Culture adequate volume  Final   Culture NO GROWTH 2 DAYS  Final   Report Status PENDING  Incomplete     Time coordinating discharge: Over 30 minutes  SIGNED:   Edwin Dada, MD  [facility] 11/30/2017, 3:49 PM   If 7PM-7AM, please contact night-coverage [email or web address] Password [email or web address]

## 2017-11-30 NOTE — Clinical Social Work Note (Signed)
Clinical Social Work Assessment  Patient Details  Name: Phillip [name] MRN: [id number] Date of Birth: 08/31/1966  Date of referral:  11/30/17               Reason for consult:  Facility Placement                Permission sought to share information with:  Case Manager, Family Supports Permission granted to share information::  Yes, Verbal Permission Granted  Name::     [place]::  [facility]  Relationship::  Remo Lipps, sister  Sport and exercise psychologist Information:  yes  Housing/Transportation Living arrangements for the past 2 months:  [place] of Information:  Patient Patient Interpreter Needed:  None Criminal Activity/Legal Involvement Pertinent to Current Situation/Hospitalization:  No - Comment as needed Significant Relationships:  Siblings Lives with:  Other (Comment)(NA) Do you feel safe going back to the place where you live?  Yes Need for family participation in patient care:  No (Coment)  Care giving concerns: Pt has no concerns.  Pt returning to [employer] / plan:  CSW spoke with pt.  No current needs per pt.  Pt will return to [facility].   Employment status:  Disabled (Comment on whether or not currently receiving Disability) Insurance information:  Managed Medicare PT Recommendations:  [employer] / Referral to community resources:  [place]  Patient/Family's Response to care: Pt does not have any concerns for care.  Patient/Family's Understanding of and Emotional Response to Diagnosis, Current Treatment, and Prognosis:  Pt is oriented x4. Is aware of current diagnosis. Pt expressed understanding of CSW role and discharge process as well as medical condition.  No questions/concerns about plan or treatment.  Emotional Assessment Appearance:  Appears stated age Attitude/Demeanor/Rapport:  (NA) Affect (typically observed):  Accepting,  Appropriate, Pleasant Orientation:  Oriented to Self, Oriented to Place, Oriented to  Time, Oriented to Situation Alcohol / Substance use:  Not Applicable Psych involvement (Current and /or in the community):  No (Comment)  Discharge Needs  Concerns to be addressed:  No discharge needs identified Readmission within the last 30 days:  No Current discharge risk:  Physical Impairment Barriers to Discharge:  No Barriers Identified   Carolin Sicks, [place] 11/30/2017, 5:20 PM

## 2017-11-30 NOTE — Progress Notes (Signed)
Pt ready to D/C. Waiting on PTAR.

## 2017-11-30 NOTE — Progress Notes (Signed)
Patient will Discharge To: [facility] Anticipated DC Date:11-30-17 Family Notified: yes, Ludwig Lean [phone] Transport OY:[id number]   Per MD patient ready for DC to [facility] . RN, patient, patient's family, and facility notified of DC. Assessment, Fl2/Pasrr, and Discharge Summary sent to facility. RN given number for report [phone]). DC packet on chart. Ambulance transport requested for patient.   CSW signing off.  Reed Breech LCSWA [phone]

## 2017-12-03 LAB — CULTURE, BLOOD (ROUTINE X 2)
Culture: NO GROWTH
Culture: NO GROWTH
SPECIAL REQUESTS: ADEQUATE
Special Requests: ADEQUATE

## 2017-12-05 ENCOUNTER — Other Ambulatory Visit: Payer: Self-pay | Admitting: Urology

## 2017-12-17 NOTE — Progress Notes (Addendum)
Preop instructions for:   Phillip [name]                       Date of Birth  10-14-1966                          Date of Procedure:  12/26/2017      Doctor:Dr Raynelle Bring  Time to arrive at [facility]: [id number] Report to: Admitting  Procedure:  Cystoscopy, right ureteroscopic laser lithotripsy, right ureteral stent  Any procedure time changes, MD office will notify you!   Do not eat or drink past midnight the night before your procedure.(To include any tube feedings-must be discontinued)  Take these morning medications only with sips of water.(or give through gastrostomy or feeding tube). Baclofen, Clonidine ( catapres), gabapentin, Metoprolol ( Lopressor), protonix, [place] contact:     [place]               Phone:   [place]: sister- [phone]  Transportation contact phone#: [place]   Please send day of procedure:current med list and meds last taken that day, confirm nothing by mouth status from what time, Patient Demographic info( to include DNR status, problem list, allergies)   RN contact name/phone#:   [facility]                           and Fax #: [phone] Bring Insurance card and picture ID Leave all jewelry and other valuables at place where living( no metal or rings to be worn) No contact lens Women-no make-up, no lotions,perfumes,powders Men-no colognes,lotions  Any questions day of procedure,call Short Stay  unit-[phone]   Sent from :[facility]                   [place]                   Fax:[phone]  Sent by :Gillian Shields RN

## 2017-12-20 ENCOUNTER — Encounter (HOSPITAL_COMMUNITY): Payer: Self-pay | Admitting: *Deleted
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
## 2017-12-26 ENCOUNTER — Ambulatory Visit (HOSPITAL_COMMUNITY): Payer: Medicare Other | Admitting: Anesthesiology

## 2017-12-26 ENCOUNTER — Encounter (HOSPITAL_COMMUNITY)
Admission: RE | Disposition: A | Discharge status: Skilled Nursing Facility | Payer: Self-pay | Source: Ambulatory Visit | Attending: Urology

## 2017-12-26 ENCOUNTER — Ambulatory Visit: Admit: 2017-12-26 | Payer: Medicare Other | Admitting: Urology

## 2017-12-26 ENCOUNTER — Ambulatory Visit (HOSPITAL_COMMUNITY)
Admission: RE | Admit: 2017-12-26 | Discharge: 2017-12-26 | Disposition: A | Discharge status: Skilled Nursing Facility | Payer: Medicare Other | Source: Ambulatory Visit | Attending: Urology | Admitting: Urology

## 2017-12-26 ENCOUNTER — Other Ambulatory Visit: Payer: Self-pay

## 2017-12-26 ENCOUNTER — Ambulatory Visit (HOSPITAL_COMMUNITY): Payer: Medicare Other

## 2017-12-26 ENCOUNTER — Encounter (HOSPITAL_COMMUNITY): Payer: Self-pay | Admitting: *Deleted

## 2017-12-26 DIAGNOSIS — I110 Hypertensive heart disease with heart failure: Secondary | ICD-10-CM | POA: Diagnosis not present

## 2017-12-26 DIAGNOSIS — Z79899 Other long term (current) drug therapy: Secondary | ICD-10-CM | POA: Diagnosis not present

## 2017-12-26 DIAGNOSIS — I10 Essential (primary) hypertension: Secondary | ICD-10-CM

## 2017-12-26 DIAGNOSIS — S14105A Unspecified injury at C5 level of cervical spinal cord, initial encounter: Secondary | ICD-10-CM | POA: Diagnosis present

## 2017-12-26 DIAGNOSIS — F329 Major depressive disorder, single episode, unspecified: Secondary | ICD-10-CM | POA: Insufficient documentation

## 2017-12-26 DIAGNOSIS — N202 Calculus of kidney with calculus of ureter: Secondary | ICD-10-CM | POA: Diagnosis present

## 2017-12-26 DIAGNOSIS — G8250 Quadriplegia, unspecified: Secondary | ICD-10-CM | POA: Diagnosis present

## 2017-12-26 DIAGNOSIS — Z720 Tobacco use: Secondary | ICD-10-CM | POA: Diagnosis present

## 2017-12-26 DIAGNOSIS — Z9350 Unspecified cystostomy status: Secondary | ICD-10-CM | POA: Insufficient documentation

## 2017-12-26 DIAGNOSIS — F419 Anxiety disorder, unspecified: Secondary | ICD-10-CM | POA: Diagnosis not present

## 2017-12-26 DIAGNOSIS — M159 Polyosteoarthritis, unspecified: Secondary | ICD-10-CM | POA: Insufficient documentation

## 2017-12-26 DIAGNOSIS — T148XXS Other injury of unspecified body region, sequela: Secondary | ICD-10-CM | POA: Diagnosis not present

## 2017-12-26 DIAGNOSIS — G40909 Epilepsy, unspecified, not intractable, without status epilepticus: Secondary | ICD-10-CM | POA: Diagnosis not present

## 2017-12-26 DIAGNOSIS — F17210 Nicotine dependence, cigarettes, uncomplicated: Secondary | ICD-10-CM | POA: Diagnosis not present

## 2017-12-26 DIAGNOSIS — I5032 Chronic diastolic (congestive) heart failure: Secondary | ICD-10-CM | POA: Diagnosis not present

## 2017-12-26 DIAGNOSIS — N342 Other urethritis: Secondary | ICD-10-CM | POA: Diagnosis not present

## 2017-12-26 DIAGNOSIS — G8220 Paraplegia, unspecified: Secondary | ICD-10-CM | POA: Diagnosis not present

## 2017-12-26 DIAGNOSIS — B3741 Candidal cystitis and urethritis: Secondary | ICD-10-CM | POA: Insufficient documentation

## 2017-12-26 DIAGNOSIS — N319 Neuromuscular dysfunction of bladder, unspecified: Secondary | ICD-10-CM | POA: Diagnosis not present

## 2017-12-26 DIAGNOSIS — N200 Calculus of kidney: Secondary | ICD-10-CM

## 2017-12-26 DIAGNOSIS — B3749 Other urogenital candidiasis: Secondary | ICD-10-CM | POA: Diagnosis present

## 2017-12-26 HISTORY — PX: CYSTOSCOPY/URETEROSCOPY/HOLMIUM LASER/STENT PLACEMENT: SHX6546

## 2017-12-26 HISTORY — DX: Other fracture of unspecified lower leg, initial encounter for closed fracture: S82.899A

## 2017-12-26 HISTORY — DX: Disorientation, unspecified: R41.0

## 2017-12-26 HISTORY — DX: Unspecified diastolic (congestive) heart failure: I50.30

## 2017-12-26 HISTORY — DX: Other cystostomy status: Z93.59

## 2017-12-26 HISTORY — DX: Autonomic dysreflexia: G90.4

## 2017-12-26 HISTORY — DX: Alcohol abuse, uncomplicated: F10.10

## 2017-12-26 HISTORY — DX: Quadriplegia, unspecified: G82.50

## 2017-12-26 HISTORY — DX: Metabolic encephalopathy: G93.41

## 2017-12-26 HISTORY — DX: Personal history of urinary calculi: Z87.442

## 2017-12-26 HISTORY — DX: Major depressive disorder, single episode, unspecified: F32.9

## 2017-12-26 HISTORY — DX: Pressure ulcer of unspecified site, unspecified stage: L89.90

## 2017-12-26 HISTORY — DX: Gastro-esophageal reflux disease without esophagitis: K21.9

## 2017-12-26 HISTORY — DX: Depression, unspecified: F32.A

## 2017-12-26 HISTORY — DX: Heart failure, unspecified: I50.9

## 2017-12-26 LAB — BASIC METABOLIC PANEL
ANION GAP: 7 (ref 5–15)
BUN: 10 mg/dL (ref 6–20)
CALCIUM: 9.5 mg/dL (ref 8.9–10.3)
CHLORIDE: 108 mmol/L (ref 101–111)
CO2: 26 mmol/L (ref 22–32)
Creatinine, Ser: 0.76 mg/dL (ref 0.61–1.24)
GFR calc non Af Amer: >60 mL/min (ref >60)
GLUCOSE: 123 mg/dL — AB (ref 65–99)
Potassium: 4.1 mmol/L (ref 3.5–5.1)
Sodium: 141 mmol/L (ref 135–145)

## 2017-12-26 LAB — GLUCOSE, CAPILLARY: GLUCOSE-CAPILLARY: 121 mg/dL — AB (ref 65–99)

## 2017-12-26 SURGERY — CYSTOSCOPY/URETEROSCOPY/HOLMIUM LASER/STENT PLACEMENT
Anesthesia: General | Site: Ureter | Laterality: Right

## 2017-12-26 SURGERY — CYSTOSCOPY/URETEROSCOPY/HOLMIUM LASER/STENT PLACEMENT
Anesthesia: General

## 2017-12-26 MED ORDER — SODIUM CHLORIDE 0.9 % IR SOLN
Status: DC | PRN
  Administered 2017-12-26: 5000 mL

## 2017-12-26 MED ORDER — FENTANYL CITRATE (PF) 100 MCG/2ML IJ SOLN
INTRAMUSCULAR | Status: AC
  Filled 2017-12-26: qty 2

## 2017-12-26 MED ORDER — GENTAMICIN SULFATE 40 MG/ML IJ SOLN: 5.0000 mg/kg | Freq: Once | INTRAVENOUS | Status: DC

## 2017-12-26 MED ORDER — FENTANYL CITRATE (PF) 100 MCG/2ML IJ SOLN: 25.0000 ug | INTRAMUSCULAR | Status: DC | PRN

## 2017-12-26 MED ORDER — 0.9 % SODIUM CHLORIDE (POUR BTL) OPTIME
TOPICAL | Status: DC | PRN
  Administered 2017-12-26: 1000 mL

## 2017-12-26 MED ORDER — PROPOFOL 10 MG/ML IV BOLUS
INTRAVENOUS | Status: AC
  Filled 2017-12-26: qty 20

## 2017-12-26 MED ORDER — PHENYLEPHRINE 40 MCG/ML (10ML) SYRINGE FOR IV PUSH (FOR BLOOD PRESSURE SUPPORT)
PREFILLED_SYRINGE | INTRAVENOUS | Status: DC | PRN
  Administered 2017-12-26: 80 ug via INTRAVENOUS

## 2017-12-26 MED ORDER — HYDRALAZINE HCL 20 MG/ML IJ SOLN: 10.0000 mg | Freq: Four times a day (QID) | INTRAMUSCULAR | Status: DC | PRN

## 2017-12-26 MED ORDER — ONDANSETRON HCL 4 MG/2ML IJ SOLN
4.0000 mg | Freq: Once | INTRAMUSCULAR | Status: DC | PRN
Start: 2017-12-26 — End: 2017-12-26

## 2017-12-26 MED ORDER — MIDAZOLAM HCL 2 MG/2ML IJ SOLN
INTRAMUSCULAR | Status: AC
Start: 2017-12-26 — End: 2017-12-26
  Filled 2017-12-26: qty 2

## 2017-12-26 MED ORDER — MEPERIDINE HCL 50 MG/ML IJ SOLN
6.2500 mg | INTRAMUSCULAR | Status: DC | PRN
Start: 2017-12-26 — End: 2017-12-26

## 2017-12-26 MED ORDER — LACTATED RINGERS IV SOLN: INTRAVENOUS | Status: DC

## 2017-12-26 MED ORDER — PHENYLEPHRINE 40 MCG/ML (10ML) SYRINGE FOR IV PUSH (FOR BLOOD PRESSURE SUPPORT)
PREFILLED_SYRINGE | INTRAVENOUS | Status: AC
  Filled 2017-12-26: qty 40

## 2017-12-26 MED ORDER — MIDAZOLAM HCL 2 MG/2ML IJ SOLN
INTRAMUSCULAR | Status: AC
  Filled 2017-12-26: qty 2

## 2017-12-26 MED ORDER — HYDRALAZINE HCL 20 MG/ML IJ SOLN
5.0000 mg | Freq: Once | INTRAMUSCULAR | Status: AC
Start: 2017-12-26 — End: 2017-12-26
  Administered 2017-12-26: 5 mg via INTRAVENOUS

## 2017-12-26 MED ORDER — CLONIDINE HCL 0.1 MG PO TABS
0.1000 mg | ORAL_TABLET | Freq: Once | ORAL | Status: AC
  Administered 2017-12-26: 0.1 mg via ORAL
  Filled 2017-12-26: qty 1

## 2017-12-26 MED ORDER — METOPROLOL TARTRATE 25 MG PO TABS
25.0000 mg | ORAL_TABLET | Freq: Once | ORAL | Status: AC
  Administered 2017-12-26: 25 mg via ORAL
  Filled 2017-12-26: qty 1

## 2017-12-26 MED ORDER — EPHEDRINE 5 MG/ML INJ
INTRAVENOUS | Status: AC
  Filled 2017-12-26: qty 10

## 2017-12-26 MED ORDER — HYDRALAZINE HCL 20 MG/ML IJ SOLN: 10.0000 mg | Freq: Once | INTRAMUSCULAR | Status: DC

## 2017-12-26 MED ORDER — HYDRALAZINE HCL 20 MG/ML IJ SOLN
INTRAMUSCULAR | Status: AC
  Administered 2017-12-26: 5 mg via INTRAVENOUS
  Filled 2017-12-26: qty 1

## 2017-12-26 MED ORDER — LIDOCAINE 2% (20 MG/ML) 5 ML SYRINGE
INTRAMUSCULAR | Status: DC | PRN
  Administered 2017-12-26: 60 mg via INTRAVENOUS

## 2017-12-26 MED ORDER — NITROGLYCERIN 2 % TD OINT: 1.0000 [in_us] | TOPICAL_OINTMENT | Freq: Once | TRANSDERMAL | Status: DC

## 2017-12-26 MED ORDER — FENTANYL CITRATE (PF) 100 MCG/2ML IJ SOLN
INTRAMUSCULAR | Status: DC | PRN
  Administered 2017-12-26: 50 ug via INTRAVENOUS

## 2017-12-26 MED ORDER — LACTATED RINGERS IV SOLN
INTRAVENOUS | Status: DC
Start: 2017-12-26 — End: 2017-12-26
  Administered 2017-12-26: 07:00:00 via INTRAVENOUS

## 2017-12-26 MED ORDER — MIDAZOLAM HCL 5 MG/5ML IJ SOLN
INTRAMUSCULAR | Status: DC | PRN
  Administered 2017-12-26: 2 mg via INTRAVENOUS

## 2017-12-26 MED ORDER — GENTAMICIN SULFATE 40 MG/ML IJ SOLN
470.0000 mg | INTRAVENOUS | Status: AC
  Administered 2017-12-26: 470 mg via INTRAVENOUS
  Filled 2017-12-26 (×3): qty 11.75

## 2017-12-26 MED ORDER — METOCLOPRAMIDE HCL 5 MG/ML IJ SOLN: 10.0000 mg | Freq: Once | INTRAMUSCULAR | Status: DC | PRN

## 2017-12-26 MED ORDER — CLONIDINE HCL 0.1 MG PO TABS
0.1000 mg | ORAL_TABLET | Freq: Once | ORAL | Status: DC
  Filled 2017-12-26: qty 1

## 2017-12-26 MED ORDER — MEPERIDINE HCL 50 MG/ML IJ SOLN: 6.2500 mg | INTRAMUSCULAR | Status: DC | PRN

## 2017-12-26 MED ORDER — FLUCONAZOLE IN SODIUM CHLORIDE 200-0.9 MG/100ML-% IV SOLN
200.0000 mg | Freq: Once | INTRAVENOUS | Status: AC
  Administered 2017-12-26: 200 mg via INTRAVENOUS
  Filled 2017-12-26 (×2): qty 100

## 2017-12-26 MED ORDER — CLONIDINE HCL 0.2 MG PO TABS
0.2000 mg | ORAL_TABLET | Freq: Once | ORAL | Status: AC
  Administered 2017-12-26: 0.2 mg via ORAL
  Filled 2017-12-26: qty 1

## 2017-12-26 MED ORDER — PROPOFOL 10 MG/ML IV BOLUS
INTRAVENOUS | Status: DC | PRN
  Administered 2017-12-26: 200 mg via INTRAVENOUS

## 2017-12-26 MED ORDER — HYDRALAZINE HCL 20 MG/ML IJ SOLN
5.0000 mg | Freq: Once | INTRAMUSCULAR | Status: AC
  Administered 2017-12-26: 5 mg via INTRAVENOUS

## 2017-12-26 MED ORDER — ONDANSETRON HCL 4 MG/2ML IJ SOLN
INTRAMUSCULAR | Status: DC | PRN
  Administered 2017-12-26: 4 mg via INTRAVENOUS

## 2017-12-26 MED ORDER — HYDROMORPHONE HCL 1 MG/ML IJ SOLN: 0.2500 mg | INTRAMUSCULAR | Status: DC | PRN

## 2017-12-26 SURGICAL SUPPLY — 21 items
BAG URO CATCHER STRL LF (MISCELLANEOUS) ×3 IMPLANT
BASKET ZERO TIP NITINOL 2.4FR (BASKET) IMPLANT
BSKT STON RTRVL ZERO TP 2.4FR (BASKET)
CATH INTERMIT  6FR 70CM (CATHETERS) ×2 IMPLANT
CLOTH BEACON ORANGE TIMEOUT ST (SAFETY) ×3 IMPLANT
COVER FOOTSWITCH UNIV (MISCELLANEOUS) ×2 IMPLANT
COVER SURGICAL LIGHT HANDLE (MISCELLANEOUS) ×3 IMPLANT
FIBER LASER FLEXIVA 365 (UROLOGICAL SUPPLIES) IMPLANT
FIBER LASER TRAC TIP (UROLOGICAL SUPPLIES) ×2 IMPLANT
GLOVE BIOGEL M STRL SZ7.5 (GLOVE) ×3 IMPLANT
GOWN STRL REUS W/TWL LRG LVL3 (GOWN DISPOSABLE) ×6 IMPLANT
GUIDEWIRE ANG ZIPWIRE 038X150 (WIRE) IMPLANT
GUIDEWIRE STR DUAL SENSOR (WIRE) ×3 IMPLANT
IV NS 1000ML (IV SOLUTION) ×3
IV NS 1000ML BAXH (IV SOLUTION) ×1 IMPLANT
MANIFOLD NEPTUNE II (INSTRUMENTS) ×3 IMPLANT
PACK CYSTO (CUSTOM PROCEDURE TRAY) ×3 IMPLANT
SHEATH URETERAL 12FRX35CM (MISCELLANEOUS) ×2 IMPLANT
STENT CONTOUR 6FRX24X.038 (STENTS) ×2 IMPLANT
TUBING CONNECTING 10 (TUBING) ×2 IMPLANT
TUBING CONNECTING 10' (TUBING) ×1

## 2017-12-26 SURGICAL SUPPLY — 20 items
BAG URO CATCHER STRL LF (MISCELLANEOUS) ×4 IMPLANT
BASKET ZERO TIP NITINOL 2.4FR (BASKET) IMPLANT
BSKT STON RTRVL ZERO TP 2.4FR (BASKET)
CATH INTERMIT  6FR 70CM (CATHETERS) IMPLANT
CLOTH BEACON ORANGE TIMEOUT ST (SAFETY) ×4 IMPLANT
COVER FOOTSWITCH UNIV (MISCELLANEOUS) IMPLANT
COVER SURGICAL LIGHT HANDLE (MISCELLANEOUS) ×4 IMPLANT
FIBER LASER FLEXIVA 365 (UROLOGICAL SUPPLIES) IMPLANT
FIBER LASER TRAC TIP (UROLOGICAL SUPPLIES) IMPLANT
GLOVE BIOGEL M STRL SZ7.5 (GLOVE) ×4 IMPLANT
GOWN STRL REUS W/TWL LRG LVL3 (GOWN DISPOSABLE) ×8 IMPLANT
GUIDEWIRE ANG ZIPWIRE 038X150 (WIRE) IMPLANT
GUIDEWIRE STR DUAL SENSOR (WIRE) ×4 IMPLANT
IV NS 1000ML (IV SOLUTION) ×3
IV NS 1000ML BAXH (IV SOLUTION) ×2 IMPLANT
MANIFOLD NEPTUNE II (INSTRUMENTS) ×4 IMPLANT
PACK CYSTO (CUSTOM PROCEDURE TRAY) ×4 IMPLANT
SHEATH URETERAL 12FRX35CM (MISCELLANEOUS) IMPLANT
TUBING CONNECTING 10 (TUBING) ×3 IMPLANT
TUBING CONNECTING 10' (TUBING) ×1

## 2017-12-26 NOTE — Transfer of Care (Signed)
Immediate Anesthesia Transfer of Care Note  Patient: LYALL FACIANE  Procedure(s) Performed: CYSTOSCOPY/URETEROSCOPY/HOLMIUM LASER/STENT PLACEMENT (Right Ureter)  Patient Location: PACU  Anesthesia Type:General  Level of Consciousness: awake, alert  and oriented  Airway & Oxygen Therapy: Patient Spontanous Breathing and Patient connected to face mask oxygen  Post-op Assessment: Report given to RN and Post -op Vital signs reviewed and stable  Post vital signs: Reviewed and stable  Last Vitals:  Vitals:   12/26/17 1300 12/26/17 1345  BP: 124/81 118/83  Pulse: 76 80  Resp: 16 (!) 22  Temp: 36.7 C 36.6 C  SpO2: 97% 98%    Last Pain:  Vitals:   12/26/17 1300  TempSrc:   PainSc: Asleep      Patients Stated Pain Goal: 4 (83/15/17 6160)  Complications: No apparent anesthesia complications

## 2017-12-26 NOTE — Progress Notes (Signed)
Patient ID: Phillip [name], male   DOB: 1966-11-01, 52 y.o.   MRN: [id number]  Patient was found to be extremely hypertensive in the OR and surgery was cancelled for this reason.  It was determined that he had not received his clonidine this morning at the SNF.  I have talked with [facility] and he will receive clonidine and hydralazine in the PACU.  If his BP can be controlled, we will proceed with his surgery later today.

## 2017-12-26 NOTE — Transfer of Care (Signed)
Immediate Anesthesia Transfer of Care Note  Patient: Phillip [name]  Procedure(s) Performed: CYSTOSCOPY/URETEROSCOPY/HOLMIUM LASER/STENT PLACEMENT (Right )  Patient Location: PACU  Anesthesia Type:case cancelled  Level of Consciousness: awake, alert  and oriented  Airway & Oxygen Therapy: Patient Spontanous Breathing  Post-op Assessment: Report given to RN, Post -op Vital signs reviewed and stable and Dr. Barbarann Ehlers contacted by PACU rn for hypertension managment  Post vital signs: Reviewed and stable  Last Vitals:  Vitals:   12/26/17 0857 12/26/17 0900  BP: (!) 178/121 (!) 176/107  Pulse: 89 89  Resp: 18 19  Temp: 36.6 C   SpO2: 95% 96%    Last Pain:  Vitals:   12/26/17 0628  TempSrc: Oral      Patients Stated Pain Goal: 4 (96/75/91 6384)  Complications: No apparent anesthesia complications

## 2017-12-26 NOTE — Anesthesia Procedure Notes (Signed)
Procedures
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
## 2017-12-26 NOTE — Progress Notes (Signed)
Deloris [place], Therapist, sports received report on [facility].  No questions at present time.  Will call to update again when [facility] comes to take patient back to [facility].

## 2017-12-26 NOTE — Op Note (Signed)
Preoperative diagnosis: Right renal and proximal ureteral calculi  Postoperative diagnosis: Right renal and proximal ureteral calculi  Procedures: 1.  Cystoscopy 2.  Right ureteroscopy 3.  Right ureteroscopic laser lithotripsy 4.  Right ureteral stent placement  Surgeon: Pryor Curia MD  Anesthesia: General  Complications: None  EBL: Minimal  Indication: Phillip [name] is a 52 year old gentleman with a history of paraplegia secondary to spinal cord injury.  He was recently admitted to the hospital with sepsis and right ureteral obstruction related to large burden proximal ureteral and right renal calculi.  He underwent right ureteral stent placement and was treated with appropriate medical therapy for resolution of his infection.  He presented today for definitive treatment of his stones.  Before induction of his procedure this morning, he was noted to be extremely hypertensive and this was not able to be controlled.  His case was therefore temporarily canceled.  Further evaluation revealed that he had not received his clonidine at his nursing facility and that he likely had rebound hypertension.  He therefore received clonidine and hydralazine and was evaluated by internal medicine.  His blood pressure was able to be controlled and he was felt to be stable to proceed with surgery later this day.  He was therefore taken to the operating room later in the day once his blood pressure was under good control.  We had discussed the options for treatment and he elected to proceed with the procedures  as described above.  Potential risks, complications, and the expected recovery process associated with these procedures were reviewed and he gave informed consent.  Description of procedure: The patient was taken to the operating room and a general anesthetic was administered.  He was given preoperative antibiotics and antifungal therapy.  He was placed in the dorsolithotomy position and prepped and  draped in the usual sterile fashion.  Next, a preoperative timeout was performed.  His suprapubic tube had been clamped.  Cystourethroscopy was performed.  He did have a mild bulbar urethral strictures previously noted during his recent stent placement.  His ureteral stent was identified and brought out to the urethral meatus with flexible graspers.  A 0.38 Sensor guidewire was then advanced up the right ureter under fluoroscopic guidance.  A 12/14 35 cm ureteral access sheath was then advanced over the wire into the proximal ureter.  The digital flexible ureteroscope was then advanced up into the proximal ureter and into the renal pelvis.  The proximal ureteral stones were noted to have been pushed up into the renal collecting system.  There were multiple large stones both in the upper pole, interpolar calyx, and the renal pelvis.  Using the 200 m holmium laser fiber, the stones were very easily fragmented and dusted on the setting of 0.5 J and 20 Hz.  This resulted in excellent dusting of the stones with no remaining large fragments following lithotripsy.  Further inspection of the renal collecting system revealed no further stones of significant size.  The ureteroscope and ureteral access sheath were then removed and the safety wire was left in place.  A 6 x 26 double-J ureteral stent was then advanced over the wire using Seldinger technique and positioned appropriately under fluoroscopic and cystoscopic guidance.  The wire was removed with a good curl noted in the renal collecting system as well as in the bladder.  The patient's bladder was emptied.  He tolerated the procedure well without complications.  His blood pressure remained stable throughout the procedure.

## 2017-12-26 NOTE — Interval H&P Note (Signed)
History and Physical Interval Note:  12/26/2017 8:01 AM  Phillip [name]  has presented today for surgery, with the diagnosis of RIGHT RENAL AND URETERAL CALCULI  The various methods of treatment have been discussed with the patient and family. After consideration of risks, benefits and other options for treatment, the patient has consented to  Procedure(s): CYSTOSCOPY/URETEROSCOPY/HOLMIUM LASER/STENT PLACEMENT (Right) as a surgical intervention .  The patient's history has been reviewed, patient examined, no change in status, stable for surgery.  I have reviewed the patient's chart and labs.  Questions were answered to the patient's satisfaction.     Kalonji Zurawski,LES

## 2017-12-26 NOTE — Anesthesia Procedure Notes (Signed)
Procedure Name: LMA Insertion Performed by: Yecheskel Kurek J, CRNA Pre-anesthesia Checklist: Patient identified, Emergency Drugs available, Suction available, Patient being monitored and Timeout performed Patient Re-evaluated:Patient Re-evaluated prior to induction Oxygen Delivery Method: Circle system utilized Preoxygenation: Pre-oxygenation with 100% oxygen Induction Type: IV induction Ventilation: Mask ventilation without difficulty LMA: LMA inserted LMA Size: 5.0 Number of attempts: 1 Placement Confirmation: positive ETCO2,  CO2 detector and breath sounds checked- equal and bilateral Tube secured with: Tape Dental Injury: Teeth and Oropharynx as per pre-operative assessment        

## 2017-12-26 NOTE — Consult Note (Signed)
Patient Demographics:    Phillip [name], is a 52 y.o. male  MRN: [id number]   DOB - [birth year]  Admit Date - 12/26/2017  Outpatient Primary MD for the patient is Garwin Brothers, MD   Assessment & Plan:    Principal Problem:   Essential hypertension Active Problems:   Tobacco abuse   Spinal cord injury at C5-C7 level without injury of spinal bone ([place])   Quadriplegia ([place])   Nephrolithiasis   Yeast UTI    1)Uncontrolled HTN-due to missed dose of clonidine and metoprolol, okay to give metoprolol and clonidine here in PACU, may use IV hydralazine as needed elevated blood pressure.  If BP remains elevated despite these measures may add topical Nitropaste as patient is mostly n.p.o. for urological procedure.  Please noted that at baseline patient has stage II hypertension requiring clonidine 0.1 mg 3 times daily and metoprolol 25 mg twice daily for BP control  2)Preop-patient is largely asymptomatic from elevated BP, BP control measures  as outlined in #1 above. He does have right-sided nephrolithiasis. ok to proceed with planned urological procedure on 12/26/17-  CYSTOSCOPY with URETEROSCOPY/HOLMIUM LASER/STENT PLACEMENT (Right) of RIGHT RENAL AND URETERAL CALCULI    3)Spinal Cord Injury C5-C7-with quadriplegia and neuromuscular deficits, continue baclofen, Elavil and gabapentin  4)Yeast UTI-urine culture from 11/28/2017 noted, okay to continue and complete the Diflucan as previously prescribed  Thank you Dr. Raynelle Bring for this medical consultation, the hospitalist medical team will follow patient concurrently with you to help manage his blood pressure.   With History of - Reviewed by me  Past Medical History:  Diagnosis Date  . Acute respiratory failure ([place]) 10/2017  . Acute respiratory failure ([place])   .  Alcohol abuse   . Ankle fracture    right   . Ankle fracture, right 06/03/2015  . Autonomic dysreflexia   . Broken hip ([place])   . Broken hip ([place])   . Cervical spinal cord injury ([place])   . Cervical spinal cord injury ([place])   . CHF (congestive heart failure) ([place])   . Chronic suprapubic catheter ([place])   . Confusion   . Decubitus ulcer    chronic   . Depression   . Diastolic heart failure ([facility])    hx of   . DJD (degenerative joint disease)   . DJD (degenerative joint disease)   . GERD (gastroesophageal reflux disease)   . Hip fracture requiring operative repair ([place]) 10/09/2014  . Hip fracture requiring operative repair ([place])   . History of kidney stones   . Metabolic encephalopathy    due to hx of uti   . 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 Neurogenic shock due to traumatic injury   . Proteus septicemia ([place])   . Proteus septicemia ([place])   . Quadriplegia ([place])   . Seizures ([place])   . Tobacco abuse   . Tobacco abuse       Past Surgical  History:  Procedure Laterality Date  . APPLICATION OF A-CELL OF CHEST/ABDOMEN N/A 08/24/2015   Procedure: PLACEMENT OF A-CELL ANd Wound  VAC;  Surgeon: Theodoro Kos, DO;  Location: [place];  Service: Plastics;  Laterality: N/A;  . APPLICATION OF WOUND VAC Right 11/11/2015   Procedure: APPLICATION OF WOUND VAC;  Surgeon: Meredith Pel, MD;  Location: [place];  Service: Orthopedics;  Laterality: Right;  . CYSTOSCOPY N/A 08/09/2016   Procedure: CYSTOSCOPY;  Surgeon: Raynelle Bring, MD;  Location: [facility];  Service: Urology;  Laterality: N/A;  . CYSTOSCOPY W/ URETERAL STENT PLACEMENT Right 11/12/2017   Procedure: CYSTOSCOPY WITH RETROGRADE PYELOGRAM/URETERAL STENT PLACEMENT;  Surgeon: Ceasar Mons, MD;  Location: [facility];  Service: Urology;  Laterality: Right;  . HARDWARE REMOVAL Right 11/11/2015   Procedure: HARDWARE REMOVAL RIGHT ANKLE;  Surgeon: Meredith Pel, MD;  Location: [place];  Service: Orthopedics;   Laterality: Right;  . I&D EXTREMITY Right 11/11/2015   Procedure: IRRIGATION AND DEBRIDEMENT RIGHT ANKLE;  Surgeon: Meredith Pel, MD;  Location: [place];  Service: Orthopedics;  Laterality: Right;  . INCISION AND DRAINAGE OF WOUND N/A 08/24/2015   Procedure: IRRIGATION AND DEBRIDEMENT SACRAL DECUBITUS ULCER ;  Surgeon: Theodoro Kos, DO;  Location: [place];  Service: Plastics;  Laterality: N/A;  . INSERTION OF SUPRAPUBIC CATHETER N/A 08/09/2016   Procedure: INSERTION OF SUPRAPUBIC CATHETER;  Surgeon: Raynelle Bring, MD;  Location: [facility];  Service: Urology;  Laterality: N/A;  . INTRAMEDULLARY (IM) NAIL INTERTROCHANTERIC Right 10/09/2014   Procedure: INTRAMEDULLARY (IM) NAIL INTERTROCHANTRIC Right knee aspiration;  Surgeon: Melina Schools, MD;  Location: [facility];  Service: Orthopedics;  Laterality: Right;  . KNEE SURGERY     right knee surgery 1988  . ORIF ANKLE FRACTURE Right 05/26/2015   Procedure: OPEN REDUCTION INTERNAL FIXATION (ORIF) ANKLE FRACTURE;  Surgeon: Meredith Pel, MD;  Location: [place];  Service: Orthopedics;  Laterality: Right;  . POSTERIOR CERVICAL FUSION/FORAMINOTOMY N/A 05/30/2015   Procedure: C3-C7 decompressive laminectomy with posterior cervical fusion utilizing lateral mass instrumentation and local bone grafting;  Surgeon: Earnie Larsson, MD;  Location: [facility];  Service: Neurosurgery;  Laterality: N/A;      No chief complaint on file.     HPI:    Phillip [name]  is a 52 y.o. male with prior history of stage II hypertensio, tobacco abuse and spinal cord injury at C5-C7 level 2 and 1/2 years ago with resultant quadriplegia neurogenic bowel and bladder who was diagnosed with right-sided nephrolithiasis and yeast UTI, he presented today from skilled nursing facility for definitive stone retrieval procedure and stent placement urologist Dr. Alinda Money, however patient was found to have elevated BP prior to surgery.  Hospitalist consult requested by urologist to help manage  blood pressure.  At baseline patient usually takes metoprolol 25 mg twice daily and clonidine 0.1 mg 3 times daily for BP control at the nursing home, neither of these medications were given to him at the nursing home prior to him coming here today for his urological procedure because he was n.p.o. for surgery.  Patient denies headaches, visual disturbance, no chest pains no palpitations no dizziness no shortness of breath.  Largely asymptomatic.  Elevated blood pressure was noted in the setting of missed clonidine and metoprolol  Patient denies fever , chills, no cough, patient is seen in PACU, Urologist and RN at bedside  Thank you Dr. Raynelle Bring for this medical consultation, the hospitalist medical team will follow patient concurrently with you  to help manage his blood pressure.    Review of systems:    In addition to the HPI above,   A full 12 point Review of 10 Systems was done, except as stated above, all other Review of 10 Systems were negative.    Social History:  Reviewed by me    Social History   Tobacco Use  . Smoking status: Current Every Day Smoker    Packs/day: 0.25    Years: 0.20    Pack years: 0.05    Types: Cigarettes  . Smokeless tobacco: Never Used  . Tobacco comment: 07/17/16 smoking about 4 a day  Substance Use Topics  . Alcohol use: No    Comment: Pt has not drank since inj 2 weeks ago       Family History :  Reviewed by me    Family History  Problem Relation Age of Onset  . Hypertension Mother      Home Medications:   Prior to Admission medications   Medication Sig Start Date End Date Taking? Authorizing Provider  acetaminophen (TYLENOL) 325 MG tablet Take 650 mg by mouth 3 (three) times daily. (0800, 1400, & 2000)   Yes [provider]  amitriptyline (ELAVIL) 25 MG tablet Take 25 mg by mouth at bedtime. (2000)   Yes [provider]  atorvastatin (LIPITOR) 40 MG tablet Take 40 mg by mouth daily at 8 pm. (2100)   Yes  [provider]  baclofen (LIORESAL) 20 MG tablet Take 20 mg by mouth 3 (three) times daily. (0900, 1300, & 2100)   Yes [provider]  bisacodyl (DULCOLAX) 10 MG suppository Place 10 mg rectally every 8 (eight) hours as needed for moderate constipation.    Yes [provider]  cloNIDine (CATAPRES) 0.1 MG tablet Take 0.1 mg by mouth 3 (three) times daily. (0800, 1400, & 2000)   Yes [provider]  ferrous sulfate 325 (65 FE) MG tablet Take 1 tablet (325 mg total) by mouth 2 (two) times daily with a meal. Patient taking differently: Take 325 mg by mouth 2 (two) times daily with a meal. (0800 & 1600) 08/26/15  Yes Rai, Ripudeep K, MD  fluconazole (DIFLUCAN) 100 MG tablet Take 100 mg by mouth daily. (0900)   Yes [provider]  furosemide (LASIX) 20 MG tablet Take 1 tablet (20 mg total) by mouth daily. Patient taking differently: Take 20 mg by mouth daily. (0800) 09/15/17  Yes Domenic Polite, MD  gabapentin (NEURONTIN) 300 MG capsule Take 300 mg by mouth 3 (three) times daily. (0900, 1300, 2100)   Yes [provider]  Melatonin 3 MG TABS Take 3-6 mg by mouth at bedtime as needed (for insomnia).   Yes [provider]  metoprolol tartrate (LOPRESSOR) 25 MG tablet Take 0.5 tablets (12.5 mg total) by mouth 2 (two) times daily. Patient taking differently: Take 12.5 mg by mouth 2 (two) times daily. (0600 & 1600) 08/26/15  Yes Rai, Ripudeep K, MD  Multiple Vitamin (MULTIVITAMIN WITH MINERALS) TABS tablet Take 1 tablet by mouth daily. Patient taking differently: Take 1 tablet by mouth daily. (0800) 06/17/15  Yes Verlee Monte, MD  nutrition supplement, JUVEN, (JUVEN) PACK Take 1 packet by mouth 2 (two) times daily between meals. (0800 & 1600)   Yes [provider]  ondansetron (ZOFRAN) 4 MG tablet Take 1 tablet (4 mg total) by mouth every 6 (six) hours as needed for nausea. 08/26/15  Yes Rai, Vernelle Emerald, MD  opium-belladonna (B&O SUPPRETTES)  16.2-60 MG suppository Place 1 suppository rectally every 8 (eight) hours as needed for bladder spasms. Patient taking differently: Place 1 suppository rectally every 8 (eight) hours as needed (muscle spasms).  06/03/16  Yes Rama, Venetia Maxon, MD  pantoprazole (PROTONIX) 40 MG tablet Take 1 tablet (40 mg total) by mouth daily. Patient taking differently: Take 40 mg by mouth daily. (0800) 12/02/15  Yes Rice, Resa Miner, MD  potassium chloride (K-DUR,KLOR-CON) 10 MEQ tablet Take 10 mEq by mouth daily. (0800)   Yes [provider]  saccharomyces boulardii (FLORASTOR) 250 MG capsule Take 1 capsule (250 mg total) by mouth 2 (two) times daily. Patient taking differently: Take 250 mg by mouth 2 (two) times daily. (0800 & 1600) 06/17/15  Yes Verlee Monte, MD  senna-docusate (SENOKOT-S) 8.6-50 MG tablet Take 2 tablets by mouth 2 (two) times daily as needed for mild constipation. 11/14/17  Yes Hosie Poisson, MD  solifenacin (VESICARE) 5 MG tablet Take 5 mg by mouth daily. (0800)   Yes [provider]  vitamin B-12 1000 MCG tablet Take 1 tablet (1,000 mcg total) by mouth daily. Patient taking differently: Take 1,000 mcg by mouth daily. (0800) 06/17/15  Yes Verlee Monte, MD  vitamin C (VITAMIN C) 500 MG tablet Take 1 tablet (500 mg total) by mouth 2 (two) times daily. Patient taking differently: Take 500 mg by mouth 2 (two) times daily. (0800 & 1600) 08/26/15  Yes Rai, Ripudeep K, MD  zinc sulfate 220 MG capsule Take 1 capsule (220 mg total) by mouth daily. Patient taking differently: Take 220 mg by mouth daily. (0800) 08/26/15  Yes Rai, Ripudeep K, MD  baclofen (LIORESAL) 10 MG tablet Take 1 tablet (10 mg total) by mouth 3 (three) times daily. Patient not taking: Reported on 12/20/2017 12/29/15   Liberty Handy, MD  fluconazole (DIFLUCAN) 200 MG tablet Take 1 tablet (200 mg total) by mouth daily. Patient not taking: Reported on 12/20/2017 11/30/17   Edwin Dada, MD     Allergies:     No Known Allergies   Physical Exam:   Vitals  Blood pressure (!) 166/112, pulse 88, temperature 97.9 F (36.6 C), resp. rate (!) 27, height 5\' 9"  (1.753 m), weight 86.2 kg (190 lb), SpO2 97 %.  Physical Examination: General appearance - alert, well appearing, and in no distress and  Mental status - alert, oriented to person, place, and time,  Eyes - sclera anicteric Neck - supple, no JVD elevation , Chest - clear  to auscultation bilaterally, symmetrical air movement,  Heart - S1 and S2 normal,  Abdomen - soft, nontender, nondistended, suprapubic catheter site is clean dry and intact  neurological - screening mental status exam normal, neuromuscular deficits consistent with quadriplegia with atrophy and contracture deformities of both upper extremities extremities - no pedal edema noted, intact peripheral pulses  Skin - warm, dry Psych-affect is appropriate   Data Review:    CBC No results for input(s): WBC, HGB, HCT, PLT, MCV, MCH, MCHC, RDW, LYMPHSABS, MONOABS, EOSABS, BASOSABS, BANDABS in the last 168 hours.  Invalid input(s): NEUTRABS, BANDSABD ------------------------------------------------------------------------------------------------------------------  Chemistries  Recent Labs  Lab 12/26/17 0620  NA 141  K 4.1  CL 108  CO2 26  GLUCOSE 123*  BUN 10  CREATININE 0.76  CALCIUM 9.5   ------------------------------------------------------------------------------------------------------------------ estimated creatinine clearance is 121.5 mL/min (by C-G formula based on SCr of 0.76 mg/dL). ------------------------------------------------------------------------------------------------------------------ No results for input(s): TSH, T4TOTAL, T3FREE, THYROIDAB in the last 72 hours.  Invalid input(s): FREET3  Coagulation profile No results for input(s): INR, PROTIME in the last 168  hours. ------------------------------------------------------------------------------------------------------------------- No results for input(s): DDIMER in the last 72 hours. -------------------------------------------------------------------------------------------------------------------  Cardiac Enzymes No results for input(s): CKMB, TROPONINI, MYOGLOBIN in the last 168 hours.  Invalid input(s): CK ------------------------------------------------------------------------------------------------------------------    Component Value Date/Time   BNP 15.6 09/13/2017 0408     ---------------------------------------------------------------------------------------------------------------  Urinalysis    Component Value Date/Time   COLORURINE YELLOW 11/28/2017 0644   APPEARANCEUR CLOUDY (A) 11/28/2017 0644   LABSPEC 1.011 11/28/2017 0644   PHURINE 6.0 11/28/2017 0644   GLUCOSEU NEGATIVE 11/28/2017 0644   HGBUR MODERATE (A) 11/28/2017 0644   BILIRUBINUR NEGATIVE 11/28/2017 0644   KETONESUR NEGATIVE 11/28/2017 0644   PROTEINUR 30 (A) 11/28/2017 0644   UROBILINOGEN 0.2 08/23/2015 0247   NITRITE NEGATIVE 11/28/2017 0644   LEUKOCYTESUR LARGE (A) 11/28/2017 0644    ----------------------------------------------------------------------------------------------------------------   Imaging Results:    No results found.  Radiological Exams on Admission: No results found.  DVT Prophylaxis -SCD   AM Labs Ordered, also please review Full Orders  Family Communication: Admission, patients condition and plan of care including tests being ordered have been discussed with the patient who indicate understanding and agree with the plan   Code Status - Full Code  Likely DC to  SNF  Condition   stable  Roxan Hockey M.D on 12/26/2017 at 9:42 AM  Thank you Dr. Raynelle Bring for this medical consultation, the hospitalist medical team will follow patient concurrently with you to help  manage his blood pressure.   Between 7am to 7pm - Pager - [phone] After 7pm go to [email or web address] - password [email or web address]  [facility] - Office  [phone]  Voice Recognition Viviann Spare dictation system was used to create this note, attempts have been made to correct errors. Please contact the author with questions and/or clarifications.

## 2017-12-26 NOTE — Discharge Instructions (Signed)
1. You may see some blood in the urine and may have some burning with urination for 48-72 hours.  2. You should call should you develop fever > 101, persistent nausea and vomiting that prevents you from eating or drinking to stay hydrated.

## 2017-12-26 NOTE — Progress Notes (Signed)
Case cancelled for malignant uncontrolled hypertension. Patient has a know history of autonomic hyperreflexia. Not safe to continue. Mean pressures >140. Diastolics measured as high as 140's.

## 2017-12-26 NOTE — Anesthesia Preprocedure Evaluation (Addendum)
Anesthesia Evaluation  Patient identified by MRN, date of birth, ID band Patient awake    Reviewed: Allergy & Precautions, H&P , Patient's Chart, lab work & pertinent test results, reviewed documented beta blocker date and time   Airway Mallampati: II  TM Distance: >3 FB Neck ROM: full    Dental no notable dental hx.    Pulmonary Current Smoker,    Pulmonary exam normal breath sounds clear to auscultation       Cardiovascular  Rhythm:regular Rate:Normal     Neuro/Psych    GI/Hepatic   Endo/Other    Renal/GU      Musculoskeletal   Abdominal   Peds  Hematology  (+) anemia ,   Anesthesia Other Findings   Reproductive/Obstetrics                             Anesthesia Physical  Anesthesia Plan  ASA: III  Anesthesia Plan: General   Post-op Pain Management:    Induction: Intravenous  PONV Risk Score and Plan: 1 and Dexamethasone, Ondansetron and Treatment may vary due to age or medical condition  Airway Management Planned: LMA  Additional Equipment:   Intra-op Plan:   Post-operative Plan: Extubation in OR  Informed Consent: I have reviewed the patients History and Physical, chart, labs and discussed the procedure including the risks, benefits and alternatives for the proposed anesthesia with the patient or authorized representative who has indicated his/her understanding and acceptance.   Dental Advisory Given  Plan Discussed with: CRNA and Surgeon  Anesthesia Plan Comments: (  )       Anesthesia Quick Evaluation                                   Anesthesia Evaluation  Patient identified by MRN, date of birth, ID band Patient awake    Reviewed: Allergy & Precautions, NPO status , Patient's Chart, lab work & pertinent test results, reviewed documented beta blocker date and time   History of Anesthesia Complications Negative for: history of anesthetic  complications  Airway Mallampati: III  TM Distance: >3 FB Neck ROM: Limited   Comment: Previous grade II view with MAC 3 Dental  (+) Dental Advisory Given,    Pulmonary neg shortness of breath, neg sleep apnea, neg COPD, neg recent URI, Current Smoker (smokes 4 cigarrettes a day),    Pulmonary exam normal breath sounds clear to auscultation       Cardiovascular hypertension, Pt. on medications and Pt. on home beta blockers (-) angina(-) Past MI, (-) Cardiac Stents and (-) CABG  Rhythm:Regular Rate:Normal  H/o autonomic dysreflexia  TTE 06/02/2016: Study Conclusions  - Left ventricle: The cavity size was normal. There was moderate   concentric hypertrophy. Systolic function was vigorous. The   estimated ejection fraction was in the range of 65% to 70%. Wall   motion was normal; there were no regional wall motion   abnormalities. Doppler parameters are consistent with abnormal   left ventricular relaxation (grade 1 diastolic dysfunction).   There was no evidence of elevated ventricular filling pressure by   Doppler parameters. - Aortic valve: Trileaflet; normal thickness leaflets. There was no   regurgitation. - Aortic root: The aortic root was normal in size. - Mitral valve: Structurally normal valve. There was no   regurgitation. - Right ventricle: The cavity size was normal. Wall thickness was  normal. Systolic function was normal. - Tricuspid valve: There was mild regurgitation. - Pulmonary arteries: Systolic pressure was mildly increased. PA   peak pressure: 43 mm Hg (S). - Inferior vena cava: The vessel was normal in size. - Pericardium, extracardiac: There was no pericardial effusion.   Neuro/Psych Seizures -,  Spinal cord injury at C5-C7, quadriplegia    GI/Hepatic negative GI ROS, (+)     substance abuse  alcohol use,   Endo/Other  negative endocrine ROS  Renal/GU negative Renal ROS   Neurogenic bladder    Musculoskeletal  (+) Arthritis  ,   Abdominal (+) + obese,   Peds  Hematology  (+) Blood dyscrasia, anemia ,   Anesthesia Other Findings   Reproductive/Obstetrics                            Anesthesia Physical Anesthesia Plan  ASA: III  Anesthesia Plan: General   Post-op Pain Management:    Induction: Intravenous  Airway Management Planned: Oral ETT  Additional Equipment:   Intra-op Plan:   Post-operative Plan: Extubation in OR  Informed Consent: I have reviewed the patients History and Physical, chart, labs and discussed the procedure including the risks, benefits and alternatives for the proposed anesthesia with the patient or authorized representative who has indicated his/her understanding and acceptance.   Dental advisory given  Plan Discussed with:   Anesthesia Plan Comments: (Risks of general anesthesia discussed including, but not limited to, sore throat, hoarse voice, chipped/damaged teeth, injury to vocal cords, nausea and vomiting, allergic reactions, lung infection, heart attack, stroke, and death. All questions answered. )       Anesthesia Quick Evaluation

## 2017-12-26 NOTE — Anesthesia Postprocedure Evaluation (Signed)
Anesthesia Post Note  Patient: Phillip [name]  Procedure(s) Performed: CYSTOSCOPY/URETEROSCOPY/HOLMIUM LASER/STENT PLACEMENT (Right Ureter)     Patient location during evaluation: PACU Anesthesia Type: General Level of consciousness: sedated Pain management: pain level controlled Vital Signs Assessment: post-procedure vital signs reviewed and stable Respiratory status: spontaneous breathing and respiratory function stable Cardiovascular status: stable Postop Assessment: no apparent nausea or vomiting Anesthetic complications: no    Last Vitals:  Vitals:   12/26/17 1615 12/26/17 1630  BP: (!) 147/88 (!) 176/106  Pulse: 99 88  Resp: (!) 32 (!) 30  Temp:    SpO2: 93% 95%    Last Pain:  Vitals:   12/26/17 1545  TempSrc:   PainSc: 0-No pain                 Asaiah Hunnicutt DANIEL

## 2017-12-27 ENCOUNTER — Encounter (HOSPITAL_COMMUNITY): Payer: Self-pay | Admitting: Urology

## 2018-01-29 ENCOUNTER — Inpatient Hospital Stay (HOSPITAL_COMMUNITY)
Admission: EM | Admit: 2018-01-29 | Discharge: 2018-02-07 | DRG: 871 | Disposition: E | Payer: Medicare Other | Attending: Pulmonary Disease | Admitting: Pulmonary Disease

## 2018-01-29 ENCOUNTER — Inpatient Hospital Stay (HOSPITAL_COMMUNITY): Payer: Medicare Other

## 2018-01-29 ENCOUNTER — Other Ambulatory Visit (HOSPITAL_COMMUNITY): Payer: Medicare Other

## 2018-01-29 ENCOUNTER — Emergency Department (HOSPITAL_COMMUNITY): Payer: Medicare Other

## 2018-01-29 DIAGNOSIS — I5030 Unspecified diastolic (congestive) heart failure: Secondary | ICD-10-CM

## 2018-01-29 DIAGNOSIS — I5032 Chronic diastolic (congestive) heart failure: Secondary | ICD-10-CM | POA: Diagnosis present

## 2018-01-29 DIAGNOSIS — G8250 Quadriplegia, unspecified: Secondary | ICD-10-CM | POA: Diagnosis present

## 2018-01-29 DIAGNOSIS — R6521 Severe sepsis with septic shock: Secondary | ICD-10-CM | POA: Diagnosis present

## 2018-01-29 DIAGNOSIS — J189 Pneumonia, unspecified organism: Secondary | ICD-10-CM

## 2018-01-29 DIAGNOSIS — F17210 Nicotine dependence, cigarettes, uncomplicated: Secondary | ICD-10-CM | POA: Diagnosis present

## 2018-01-29 DIAGNOSIS — Z87442 Personal history of urinary calculi: Secondary | ICD-10-CM | POA: Diagnosis not present

## 2018-01-29 DIAGNOSIS — Z452 Encounter for adjustment and management of vascular access device: Secondary | ICD-10-CM

## 2018-01-29 DIAGNOSIS — Z87440 Personal history of urinary (tract) infections: Secondary | ICD-10-CM | POA: Diagnosis not present

## 2018-01-29 DIAGNOSIS — J690 Pneumonitis due to inhalation of food and vomit: Secondary | ICD-10-CM | POA: Diagnosis present

## 2018-01-29 DIAGNOSIS — R140 Abdominal distension (gaseous): Secondary | ICD-10-CM

## 2018-01-29 DIAGNOSIS — Z8249 Family history of ischemic heart disease and other diseases of the circulatory system: Secondary | ICD-10-CM | POA: Diagnosis not present

## 2018-01-29 DIAGNOSIS — N179 Acute kidney failure, unspecified: Secondary | ICD-10-CM | POA: Diagnosis present

## 2018-01-29 DIAGNOSIS — Z66 Do not resuscitate: Secondary | ICD-10-CM | POA: Diagnosis present

## 2018-01-29 DIAGNOSIS — A419 Sepsis, unspecified organism: Secondary | ICD-10-CM | POA: Diagnosis present

## 2018-01-29 DIAGNOSIS — R918 Other nonspecific abnormal finding of lung field: Secondary | ICD-10-CM

## 2018-01-29 DIAGNOSIS — K219 Gastro-esophageal reflux disease without esophagitis: Secondary | ICD-10-CM | POA: Diagnosis present

## 2018-01-29 DIAGNOSIS — J9601 Acute respiratory failure with hypoxia: Secondary | ICD-10-CM | POA: Diagnosis present

## 2018-01-29 DIAGNOSIS — Z4659 Encounter for fitting and adjustment of other gastrointestinal appliance and device: Secondary | ICD-10-CM

## 2018-01-29 DIAGNOSIS — R4182 Altered mental status, unspecified: Secondary | ICD-10-CM | POA: Diagnosis present

## 2018-01-29 LAB — CBC WITH DIFFERENTIAL/PLATELET
Band Neutrophils: 23 %
Basophils Absolute: 0 K/uL (ref 0.0–0.1)
Basophils Relative: 0 %
Blasts: 0 %
Eosinophils Absolute: 0 K/uL (ref 0.0–0.7)
Eosinophils Relative: 0 %
HCT: 50.4 % (ref 39.0–52.0)
Hemoglobin: 16.2 g/dL (ref 13.0–17.0)
Lymphocytes Relative: 17 %
Lymphs Abs: 4.4 K/uL — ABNORMAL HIGH (ref 0.7–4.0)
MCH: 29.5 pg (ref 26.0–34.0)
MCHC: 32.1 g/dL (ref 30.0–36.0)
MCV: 91.6 fL (ref 78.0–100.0)
Metamyelocytes Relative: 0 %
Monocytes Absolute: 1 K/uL (ref 0.1–1.0)
Monocytes Relative: 4 %
Myelocytes: 0 %
Neutro Abs: 20.5 K/uL — ABNORMAL HIGH (ref 1.7–7.7)
Neutrophils Relative %: 56 %
Other: 0 %
Platelets: 354 K/uL (ref 150–400)
Promyelocytes Absolute: 0 %
RBC: 5.5 MIL/uL (ref 4.22–5.81)
RDW: 14.3 % (ref 11.5–15.5)
WBC Morphology: INCREASED
WBC: 25.9 K/uL — ABNORMAL HIGH (ref 4.0–10.5)
nRBC: 0 /100{WBCs}

## 2018-01-29 LAB — RESPIRATORY PANEL BY PCR
Adenovirus: NOT DETECTED
BORDETELLA PERTUSSIS-RVPCR: NOT DETECTED
CORONAVIRUS 229E-RVPPCR: NOT DETECTED
CORONAVIRUS HKU1-RVPPCR: NOT DETECTED
Chlamydophila pneumoniae: NOT DETECTED
Coronavirus NL63: NOT DETECTED
Coronavirus OC43: NOT DETECTED
INFLUENZA B-RVPPCR: NOT DETECTED
Influenza A: NOT DETECTED
METAPNEUMOVIRUS-RVPPCR: NOT DETECTED
MYCOPLASMA PNEUMONIAE-RVPPCR: NOT DETECTED
PARAINFLUENZA VIRUS 2-RVPPCR: NOT DETECTED
Parainfluenza Virus 1: NOT DETECTED
Parainfluenza Virus 3: NOT DETECTED
Parainfluenza Virus 4: NOT DETECTED
RESPIRATORY SYNCYTIAL VIRUS-RVPPCR: NOT DETECTED
Rhinovirus / Enterovirus: NOT DETECTED

## 2018-01-29 LAB — I-STAT CHEM 8, ED
BUN: 24 mg/dL — AB (ref 6–20)
Calcium, Ion: 1.19 mmol/L (ref 1.15–1.40)
Chloride: 107 mmol/L (ref 101–111)
Creatinine, Ser: 1.5 mg/dL — ABNORMAL HIGH (ref 0.61–1.24)
Glucose, Bld: 186 mg/dL — ABNORMAL HIGH (ref 65–99)
HCT: 52 % (ref 39.0–52.0)
Hemoglobin: 17.7 g/dL — ABNORMAL HIGH (ref 13.0–17.0)
Potassium: 4.5 mmol/L (ref 3.5–5.1)
SODIUM: 140 mmol/L (ref 135–145)
TCO2: 22 mmol/L (ref 22–32)

## 2018-01-29 LAB — I-STAT ARTERIAL BLOOD GAS, ED
ACID-BASE DEFICIT: 11 mmol/L — AB (ref 0.0–2.0)
Bicarbonate: 16.1 mmol/L — ABNORMAL LOW (ref 20.0–28.0)
O2 Saturation: 100 %
PH ART: 7.205 — AB (ref 7.350–7.450)
TCO2: 17 mmol/L — AB (ref 22–32)
pCO2 arterial: 40.8 mmHg (ref 32.0–48.0)
pO2, Arterial: 213 mmHg — ABNORMAL HIGH (ref 83.0–108.0)

## 2018-01-29 LAB — COMPREHENSIVE METABOLIC PANEL
ALBUMIN: 3.7 g/dL (ref 3.5–5.0)
ALK PHOS: 256 U/L — AB (ref 38–126)
ALT: 74 U/L — ABNORMAL HIGH (ref 17–63)
ANION GAP: 19 — AB (ref 5–15)
AST: 122 U/L — ABNORMAL HIGH (ref 15–41)
BUN: 22 mg/dL — ABNORMAL HIGH (ref 6–20)
CHLORIDE: 101 mmol/L (ref 101–111)
CO2: 19 mmol/L — AB (ref 22–32)
Calcium: 10.4 mg/dL — ABNORMAL HIGH (ref 8.9–10.3)
Creatinine, Ser: 1.99 mg/dL — ABNORMAL HIGH (ref 0.61–1.24)
GFR calc Af Amer: 44 mL/min — ABNORMAL LOW (ref >60)
GFR calc non Af Amer: 38 mL/min — ABNORMAL LOW (ref >60)
GLUCOSE: 179 mg/dL — AB (ref 65–99)
POTASSIUM: 4.4 mmol/L (ref 3.5–5.1)
SODIUM: 139 mmol/L (ref 135–145)
Total Bilirubin: 3 mg/dL — ABNORMAL HIGH (ref 0.3–1.2)
Total Protein: 8.5 g/dL — ABNORMAL HIGH (ref 6.5–8.1)

## 2018-01-29 LAB — LACTIC ACID, PLASMA
LACTIC ACID, VENOUS: 4.6 mmol/L — AB (ref 0.5–1.9)
Lactic Acid, Venous: 6.8 mmol/L (ref 0.5–1.9)

## 2018-01-29 LAB — CORTISOL: CORTISOL PLASMA: 54.7 ug/dL

## 2018-01-29 LAB — GLUCOSE, CAPILLARY
Glucose-Capillary: 179 mg/dL — ABNORMAL HIGH (ref 65–99)
Glucose-Capillary: 197 mg/dL — ABNORMAL HIGH (ref 65–99)
Glucose-Capillary: 228 mg/dL — ABNORMAL HIGH (ref 65–99)
Glucose-Capillary: 229 mg/dL — ABNORMAL HIGH (ref 65–99)

## 2018-01-29 LAB — POC OCCULT BLOOD, ED: Fecal Occult Bld: POSITIVE — AB

## 2018-01-29 LAB — BLOOD GAS, ARTERIAL
ACID-BASE DEFICIT: 3 mmol/L — AB (ref 0.0–2.0)
BICARBONATE: 21 mmol/L (ref 20.0–28.0)
DRAWN BY: 519031
FIO2: 60
LHR: 18 {breaths}/min
MECHVT: 600 mL
O2 SAT: 96.1 %
PEEP/CPAP: 5 cmH2O
PO2 ART: 90.8 mmHg (ref 83.0–108.0)
Patient temperature: 99.8
pCO2 arterial: 35.8 mmHg (ref 32.0–48.0)
pH, Arterial: 7.391 (ref 7.350–7.450)

## 2018-01-29 LAB — MRSA PCR SCREENING: MRSA by PCR: POSITIVE — AB

## 2018-01-29 LAB — I-STAT CG4 LACTIC ACID, ED: Lactic Acid, Venous: 6.8 mmol/L (ref 0.5–1.9)

## 2018-01-29 LAB — LIPASE, BLOOD: Lipase: 31 U/L (ref 11–51)

## 2018-01-29 LAB — INFLUENZA PANEL BY PCR (TYPE A & B)
INFLAPCR: NEGATIVE
INFLBPCR: NEGATIVE

## 2018-01-29 LAB — PROCALCITONIN: PROCALCITONIN: 19.52 ng/mL

## 2018-01-29 LAB — I-STAT TROPONIN, ED: Troponin i, poc: 0.11 ng/mL (ref 0.00–0.08)

## 2018-01-29 LAB — TROPONIN I
TROPONIN I: 0.12 ng/mL — AB (ref <0.03)
Troponin I: 0.07 ng/mL (ref <0.03)
Troponin I: 0.19 ng/mL (ref <0.03)

## 2018-01-29 LAB — SAMPLE TO BLOOD BANK

## 2018-01-29 LAB — AMYLASE: AMYLASE: 128 U/L — AB (ref 28–100)

## 2018-01-29 MED ORDER — VANCOMYCIN HCL 10 G IV SOLR
1250.0000 mg | INTRAVENOUS | Status: DC
  Filled 2018-01-29: qty 1250

## 2018-01-29 MED ORDER — SODIUM CHLORIDE 0.9 % IV BOLUS (SEPSIS)
1000.0000 mL | Freq: Once | INTRAVENOUS | Status: AC
  Administered 2018-01-29: 1000 mL via INTRAVENOUS

## 2018-01-29 MED ORDER — SODIUM CHLORIDE 0.9 % IV SOLN
INTRAVENOUS | Status: AC | PRN
  Administered 2018-01-29: 1000 mL via INTRAVENOUS

## 2018-01-29 MED ORDER — VANCOMYCIN HCL IN DEXTROSE 1-5 GM/200ML-% IV SOLN
1000.0000 mg | Freq: Once | INTRAVENOUS | Status: DC
  Filled 2018-01-29: qty 200

## 2018-01-29 MED ORDER — SODIUM BICARBONATE 8.4 % IV SOLN
100.0000 meq | Freq: Once | INTRAVENOUS | Status: AC
  Administered 2018-01-29: 100 meq via INTRAVENOUS
  Filled 2018-01-29: qty 100

## 2018-01-29 MED ORDER — ORAL CARE MOUTH RINSE
15.0000 mL | Freq: Four times a day (QID) | OROMUCOSAL | Status: DC
  Administered 2018-01-29 (×2): 15 mL via OROMUCOSAL

## 2018-01-29 MED ORDER — ROCURONIUM BROMIDE 50 MG/5ML IV SOLN
INTRAVENOUS | Status: AC | PRN
  Administered 2018-01-29: 100 mg via INTRAVENOUS

## 2018-01-29 MED ORDER — ACETAMINOPHEN 650 MG RE SUPP
650.0000 mg | Freq: Once | RECTAL | Status: AC
Start: 2018-01-29 — End: 2018-01-29
  Administered 2018-01-29: 650 mg via RECTAL
  Filled 2018-01-29: qty 1

## 2018-01-29 MED ORDER — VASOPRESSIN 20 UNIT/ML IV SOLN
0.0300 [IU]/min | INTRAVENOUS | Status: DC
  Administered 2018-01-29: 0.03 [IU]/min via INTRAVENOUS
  Filled 2018-01-29 (×2): qty 2

## 2018-01-29 MED ORDER — CHLORHEXIDINE GLUCONATE 0.12% ORAL RINSE (MEDLINE KIT)
15.0000 mL | Freq: Two times a day (BID) | OROMUCOSAL | Status: DC
  Administered 2018-01-29: 15 mL via OROMUCOSAL

## 2018-01-29 MED ORDER — SODIUM BICARBONATE 8.4 % IV SOLN
INTRAVENOUS | Status: AC | PRN
  Administered 2018-01-29: 50 meq via INTRAVENOUS

## 2018-01-29 MED ORDER — SODIUM BICARBONATE 8.4 % IV SOLN
INTRAVENOUS | Status: AC
  Administered 2018-01-29: 100 meq via INTRAVENOUS
  Filled 2018-01-29: qty 100

## 2018-01-29 MED ORDER — LACTATED RINGERS IV BOLUS (SEPSIS): 1000.0000 mL | Freq: Once | INTRAVENOUS | Status: DC

## 2018-01-29 MED ORDER — ETOMIDATE 2 MG/ML IV SOLN
INTRAVENOUS | Status: AC | PRN
  Administered 2018-01-29: 20 mg via INTRAVENOUS

## 2018-01-29 MED ORDER — NOREPINEPHRINE BITARTRATE 1 MG/ML IV SOLN
0.0000 ug/min | INTRAVENOUS | Status: DC
  Administered 2018-01-29: 40 ug/min via INTRAVENOUS
  Filled 2018-01-29 (×3): qty 16

## 2018-01-29 MED ORDER — DEXTROSE 5 % IV SOLN
0.5000 ug/min | INTRAVENOUS | Status: DC
  Administered 2018-01-29: 2 ug/min via INTRAVENOUS
  Administered 2018-01-29: 20 ug/min via INTRAVENOUS
  Filled 2018-01-29 (×2): qty 4

## 2018-01-29 MED ORDER — ACETAMINOPHEN 160 MG/5ML PO SOLN
650.0000 mg | ORAL | Status: DC | PRN
  Administered 2018-01-29: 650 mg
  Filled 2018-01-29: qty 20.3

## 2018-01-29 MED ORDER — NOREPINEPHRINE BITARTRATE 1 MG/ML IV SOLN
0.0000 ug/min | INTRAVENOUS | Status: DC
  Administered 2018-01-29 (×3): 40 ug/min via INTRAVENOUS
  Administered 2018-01-29: 30 ug/min via INTRAVENOUS
  Administered 2018-01-29: 10 ug/min via INTRAVENOUS
  Filled 2018-01-29 (×5): qty 4

## 2018-01-29 MED ORDER — PIPERACILLIN-TAZOBACTAM 3.375 G IVPB
3.3750 g | Freq: Three times a day (TID) | INTRAVENOUS | Status: DC
  Administered 2018-01-29 (×2): 3.375 g via INTRAVENOUS
  Filled 2018-01-29 (×3): qty 50

## 2018-01-29 MED ORDER — SODIUM CHLORIDE 0.9 % IV SOLN
INTRAVENOUS | Status: AC | PRN
  Administered 2018-01-29: 2000 mL via INTRAVENOUS

## 2018-01-29 MED ORDER — PANTOPRAZOLE SODIUM 40 MG IV SOLR
40.0000 mg | Freq: Every day | INTRAVENOUS | Status: DC
  Administered 2018-01-29: 40 mg via INTRAVENOUS
  Filled 2018-01-29: qty 40

## 2018-01-29 MED ORDER — PIPERACILLIN-TAZOBACTAM 3.375 G IVPB 30 MIN
3.3750 g | Freq: Once | INTRAVENOUS | Status: AC
  Administered 2018-01-29: 3.375 g via INTRAVENOUS
  Filled 2018-01-29: qty 50

## 2018-01-29 MED ORDER — VANCOMYCIN HCL 10 G IV SOLR
2000.0000 mg | Freq: Once | INTRAVENOUS | Status: AC
  Administered 2018-01-29: 2000 mg via INTRAVENOUS
  Filled 2018-01-29: qty 2000

## 2018-01-29 MED ORDER — HEPARIN SODIUM (PORCINE) 5000 UNIT/ML IJ SOLN
5000.0000 [IU] | Freq: Three times a day (TID) | INTRAMUSCULAR | Status: DC
  Administered 2018-01-29 (×2): 5000 [IU] via SUBCUTANEOUS
  Filled 2018-01-29 (×4): qty 1

## 2018-01-29 NOTE — ED Notes (Signed)
Pt has suprapubic cath in place upon arrival, tubing and standard drainage bag exchanged and UA & urine culture collected, Darl Householder, MD aware

## 2018-01-29 NOTE — ED Notes (Signed)
ICU, MD at bedside speaking with the pts little sister at bedside, MD informed of x 1 IV access, L AC  IV removed in CT scan, Darl Householder, MD and ICU MD aware of delay in starting Vancomycin d/t x1 IV access with Levophed infusing at this time, pharmacist checking compatability of Vancyomycin and Levo

## 2018-01-29 NOTE — Progress Notes (Signed)
Pharmacy Antibiotic Note  Phillip [name] is a 52 y.o. male admitted on 02/02/2018 with sepsis.  Pharmacy has been consulted for vancomycin/zosyn dosing. Noted hx of incomplete quadriplegia, suprapubic catheter, recurrent UTIs in NH and presenting unresponsive. Afebrile, WBC 25.9, SCr 1.5 on admit, now up to 1.99 (CrCl difficult to interpret given incomplete quadriplegia).  Plan: Vancomycin 2g IV x 1; then 1250mg  IV q24h Zosyn 3.375g IV (22min inf) x1; then 3.375g IV q8h (4h inf) Monitor clinical progress, c/s, renal function F/u de-escalation plan/LOT, vancomycin trough at Css    Temp (24hrs), Avg:96.9 F (36.1 C), Min:96.9 F (36.1 C), Max:96.9 F (36.1 C)  Recent Labs  Lab 01/16/2018 0908 01/14/2018 0945  WBC  --  25.9*  CREATININE 1.50* 1.99*  LATICACIDVEN 6.80*  --     CrCl cannot be calculated (Unknown ideal weight.).    No Known Allergies  Elicia Lamp, PharmD, BCPS Clinical Pharmacist Clinical phone for 01/26/2018 until 3:30pm: [phone] If after 3:30pm, please call main pharmacy at: [phone] 01/29/2018 10:57 AM

## 2018-01-29 NOTE — Progress Notes (Signed)
Escorted patient sister to consultation B for EDP/ critical Care team to speak with family.  Patient currently intubated. Provided emotional support. Chaplain available as needed.  Jaclynn Major, [place], [facility], Pager [phone]
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
## 2018-01-29 NOTE — Code Documentation (Signed)
Pt arrives to Trauma C with nasal intubation being bagged, pt is unresponsive. Attempting to gain IV access at this time.

## 2018-01-29 NOTE — Procedures (Signed)
Central Venous Catheter Insertion Procedure Note  Procedure: Insertion of Central Venous Catheter  Indications:  vascular access, shock  Procedure Details  Informed consent was obtained for the procedure, including sedation.  Risks of lung perforation, hemorrhage, arrhythmia, and adverse drug reaction were discussed.   Maximum sterile technique was used including antiseptics, cap, gloves, gown, hand hygiene, mask and sheet.  Under sterile conditions the skin above the on the right internal jugular vein was prepped with betadine and covered with a sterile drape. Local anesthesia was applied to the skin and subcutaneous tissues. A 22-gauge needle was used to identify the vein. An 18-gauge needle was then inserted into the vein. A guide wire was then passed easily through the catheter. There were no arrhythmias. The catheter was then withdrawn. A 7.0 French triple-lumen was then inserted into the vessel over the guide wire. The catheter was sutured into place.  Findings: There were no changes to vital signs. Catheter was flushed with 20 cc NS. Patient did tolerate procedure well.  Recommendations: CXR ordered to verify placement.  Mali Mahrosh Donnell, MD [facility] Pulmonology/Critical Care Pager [phone] After hours pager: [phone]

## 2018-01-29 NOTE — Procedures (Signed)
Arterial Catheter Insertion Procedure Note Phillip [name] [id number] 1966-06-22  Procedure: Insertion of Arterial Catheter  Indications: Blood pressure monitoring and Frequent blood sampling  Procedure Details Consent: Risks of procedure as well as the alternatives and risks of each were explained to the (patient/caregiver).  Consent for procedure obtained. Time Out: Verified patient identification, verified procedure, site/side was marked, verified correct patient position, special equipment/implants available, medications/allergies/relevent history reviewed, required imaging and test results available.  Performed  Maximum sterile technique was used including antiseptics, cap, gloves, gown, hand hygiene, mask and sheet. Skin prep: Chlorhexidine; local anesthetic administered 20 gauge catheter was inserted into right radial artery using the Seldinger technique.  Evaluation Blood flow good; BP tracing good. Complications: No apparent complications.   Phillip [name] 01/25/2018

## 2018-01-29 NOTE — Progress Notes (Signed)
  Echocardiogram 2D Echocardiogram has been performed.  Phillip [name] 01/28/2018, 2:56 PM
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
## 2018-01-29 NOTE — ED Notes (Signed)
Darl Householder, MD aware of pts critical labs
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
## 2018-01-29 NOTE — ED Triage Notes (Signed)
Pt arrives by [facility] care found unresponsive by staff, LSN at 0700 per nursing home staff. Pt was nasally intubated in the field due to GCS of 3 to protect airway.

## 2018-01-29 NOTE — Progress Notes (Signed)
   01/27/2018 1700  Clinical Encounter Type  Visited With Patient and family together  Visit Type Initial  Referral From Chaplain  Consult/Referral To Chaplain  Spiritual Encounters  Spiritual Needs Emotional  Stress Factors  Patient Stress Factors Exhausted  Family Stress Factors Exhausted    This was a page for a 52 year old patient . Patient was intubated when I arrived. Phillip [name] had arrived on-site early as he had primary and I was with Patient on 46M. Large group of family members were present in great emotional distress when I arrived.. I provided emotional support and and compassionate presence.  Diavian Furgason a Medical sales representative, [employer]
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
## 2018-01-29 NOTE — Plan of Care (Signed)
Plan of Care Note  I spoke with the patient's sisters, 3 children, cousins and other family members and explained that he is in a severe state of shock, possibly due to aspiration though still not entirely clear.  We have asked our surgical colleagues to assess the patient and they do not believe that his colonic distention reason for his shock at this time but requested follow-up KUB to assess for any new pneumatosis.  I explained that he is now on multiple forms of life support, including 3 vasopressors and is nearing the maximum dose of all 3 he had continuing to decline.  Given all of this I explained that it is unlikely that he will survive and recover to a similar quality of life that he had previous to this.  After much discussion among the family, the sisters and 3 children told me that they would like to transition to DO NOT RESUSCITATE, but otherwise continue all other care the same for now and reassess in the morning.  Mali Ciella Obi, MD [facility] Pulmonology/Critical Care Pager [phone] After hours pager: [phone]

## 2018-01-29 NOTE — Progress Notes (Signed)
[facility] Progress Note Patient Name: Phillip [name] DOB: [birth year] MRN: [id number]   Date of Service  01/18/2018  HPI/Events of Note  Progressive hypotension, nearing max dose of pressors. Pt DNR.   eICU Interventions  Continue current measures, no further interventions, prognosis poor.         Laverle Hobby 01/28/2018, 9:13 PM

## 2018-01-30 LAB — BLOOD CULTURE ID PANEL (REFLEXED)
Acinetobacter baumannii: NOT DETECTED
CANDIDA ALBICANS: NOT DETECTED
CANDIDA GLABRATA: NOT DETECTED
CANDIDA KRUSEI: NOT DETECTED
Candida parapsilosis: NOT DETECTED
Candida tropicalis: NOT DETECTED
ENTEROBACTER CLOACAE COMPLEX: NOT DETECTED
ENTEROBACTERIACEAE SPECIES: NOT DETECTED
ESCHERICHIA COLI: NOT DETECTED
Enterococcus species: NOT DETECTED
Haemophilus influenzae: NOT DETECTED
Klebsiella oxytoca: NOT DETECTED
Klebsiella pneumoniae: NOT DETECTED
Listeria monocytogenes: NOT DETECTED
Methicillin resistance: DETECTED — AB
NEISSERIA MENINGITIDIS: NOT DETECTED
PROTEUS SPECIES: NOT DETECTED
Pseudomonas aeruginosa: NOT DETECTED
STAPHYLOCOCCUS SPECIES: DETECTED — AB
STREPTOCOCCUS AGALACTIAE: NOT DETECTED
STREPTOCOCCUS PYOGENES: NOT DETECTED
STREPTOCOCCUS SPECIES: NOT DETECTED
Serratia marcescens: NOT DETECTED
Staphylococcus aureus (BCID): NOT DETECTED
Streptococcus pneumoniae: NOT DETECTED

## 2018-01-30 MED FILL — Medication: Qty: 1 | Status: AC

## 2018-02-01 LAB — CULTURE, BLOOD (ROUTINE X 2): SPECIAL REQUESTS: ADEQUATE

## 2018-02-03 LAB — CULTURE, BLOOD (ROUTINE X 2)
Culture: NO GROWTH
SPECIAL REQUESTS: ADEQUATE

## 2018-02-07 NOTE — Progress Notes (Signed)
Patient temperature increasing despite interventions of ice and tylenol.  Elink MD aware
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
## 2018-02-07 NOTE — Progress Notes (Signed)
Patient decompensating despite all measures of blood pressure support and interventions.  Patient is partial code, intubation only.  Elink MD aware, CCM nurse practitioner [facility] aware.
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
## 2018-02-07 DEATH — deceased
